# Patient Record
Sex: Female | Born: 1975 | Race: White | Hispanic: No | Marital: Single | Smoking: Former smoker
Health system: Southern US, Community
[De-identification: ages and names within clinical notes are randomized; demographics above are authoritative.]

## PROBLEM LIST (undated history)

## (undated) DIAGNOSIS — E119 Type 2 diabetes mellitus without complications: Secondary | ICD-10-CM

## (undated) DIAGNOSIS — I10 Essential (primary) hypertension: Secondary | ICD-10-CM

## (undated) DIAGNOSIS — E1143 Type 2 diabetes mellitus with diabetic autonomic (poly)neuropathy: Secondary | ICD-10-CM

## (undated) DIAGNOSIS — F419 Anxiety disorder, unspecified: Secondary | ICD-10-CM

## (undated) DIAGNOSIS — G40909 Epilepsy, unspecified, not intractable, without status epilepticus: Secondary | ICD-10-CM

## (undated) DIAGNOSIS — K3184 Gastroparesis: Secondary | ICD-10-CM

## (undated) DIAGNOSIS — K429 Umbilical hernia without obstruction or gangrene: Secondary | ICD-10-CM

## (undated) DIAGNOSIS — I201 Angina pectoris with documented spasm: Secondary | ICD-10-CM

## (undated) DIAGNOSIS — N159 Renal tubulo-interstitial disease, unspecified: Secondary | ICD-10-CM

## (undated) DIAGNOSIS — E109 Type 1 diabetes mellitus without complications: Secondary | ICD-10-CM

## (undated) DIAGNOSIS — F319 Bipolar disorder, unspecified: Secondary | ICD-10-CM

## (undated) DIAGNOSIS — E785 Hyperlipidemia, unspecified: Secondary | ICD-10-CM

## (undated) DIAGNOSIS — E039 Hypothyroidism, unspecified: Secondary | ICD-10-CM

## (undated) HISTORY — PX: CARDIAC CATHETERIZATION: SHX172

## (undated) HISTORY — DX: Umbilical hernia without obstruction or gangrene: K42.9

## (undated) HISTORY — DX: Epilepsy, unspecified, not intractable, without status epilepticus: G40.909

## (undated) HISTORY — DX: Type 2 diabetes mellitus with diabetic autonomic (poly)neuropathy: E11.43

## (undated) HISTORY — DX: Angina pectoris with documented spasm: I20.1

## (undated) HISTORY — DX: Gastroparesis: K31.84

## (undated) HISTORY — PX: TONSILLECTOMY: SUR1361

## (undated) HISTORY — DX: Type 1 diabetes mellitus without complications: E10.9

## (undated) HISTORY — DX: Anxiety disorder, unspecified: F41.9

## (undated) HISTORY — PX: ADENOIDECTOMY: SUR15

## (undated) HISTORY — DX: Hyperlipidemia, unspecified: E78.5

## (undated) HISTORY — DX: Type 2 diabetes mellitus without complications: E11.9

## (undated) HISTORY — DX: Essential (primary) hypertension: I10

## (undated) HISTORY — PX: VAGINAL HYSTERECTOMY: SUR661

---

## 1998-02-15 ENCOUNTER — Ambulatory Visit (HOSPITAL_COMMUNITY): Admission: RE | Admit: 1998-02-15 | Discharge: 1998-02-15 | Payer: Self-pay | Admitting: Obstetrics and Gynecology

## 1998-05-05 ENCOUNTER — Inpatient Hospital Stay (HOSPITAL_COMMUNITY): Admission: AD | Admit: 1998-05-05 | Discharge: 1998-05-07 | Payer: Self-pay | Admitting: Obstetrics and Gynecology

## 1999-02-20 ENCOUNTER — Other Ambulatory Visit: Admission: RE | Admit: 1999-02-20 | Discharge: 1999-02-20 | Payer: Self-pay | Admitting: Obstetrics and Gynecology

## 1999-03-24 ENCOUNTER — Ambulatory Visit (HOSPITAL_COMMUNITY): Admission: RE | Admit: 1999-03-24 | Discharge: 1999-03-24 | Payer: Self-pay | Admitting: Obstetrics and Gynecology

## 1999-03-24 ENCOUNTER — Encounter (INDEPENDENT_AMBULATORY_CARE_PROVIDER_SITE_OTHER): Payer: Self-pay

## 1999-10-27 ENCOUNTER — Other Ambulatory Visit: Admission: RE | Admit: 1999-10-27 | Discharge: 1999-10-27 | Payer: Self-pay | Admitting: Obstetrics and Gynecology

## 2000-05-12 ENCOUNTER — Other Ambulatory Visit: Admission: RE | Admit: 2000-05-12 | Discharge: 2000-05-12 | Payer: Self-pay | Admitting: Obstetrics and Gynecology

## 2000-07-08 ENCOUNTER — Inpatient Hospital Stay (HOSPITAL_COMMUNITY): Admission: AD | Admit: 2000-07-08 | Discharge: 2000-07-08 | Payer: Self-pay | Admitting: Obstetrics and Gynecology

## 2000-07-09 ENCOUNTER — Encounter (HOSPITAL_COMMUNITY): Admission: RE | Admit: 2000-07-09 | Discharge: 2000-10-07 | Payer: Self-pay | Admitting: Obstetrics and Gynecology

## 2000-07-13 ENCOUNTER — Ambulatory Visit (HOSPITAL_COMMUNITY): Admission: RE | Admit: 2000-07-13 | Discharge: 2000-07-13 | Payer: Self-pay | Admitting: Obstetrics and Gynecology

## 2001-07-19 ENCOUNTER — Other Ambulatory Visit: Admission: RE | Admit: 2001-07-19 | Discharge: 2001-07-19 | Payer: Self-pay | Admitting: Obstetrics and Gynecology

## 2001-11-28 ENCOUNTER — Inpatient Hospital Stay (HOSPITAL_COMMUNITY): Admission: AD | Admit: 2001-11-28 | Discharge: 2001-11-28 | Payer: Self-pay | Admitting: Obstetrics and Gynecology

## 2001-11-28 ENCOUNTER — Encounter: Payer: Self-pay | Admitting: Obstetrics and Gynecology

## 2001-12-21 ENCOUNTER — Encounter (INDEPENDENT_AMBULATORY_CARE_PROVIDER_SITE_OTHER): Payer: Self-pay | Admitting: Specialist

## 2001-12-21 ENCOUNTER — Observation Stay (HOSPITAL_COMMUNITY): Admission: RE | Admit: 2001-12-21 | Discharge: 2001-12-22 | Payer: Self-pay | Admitting: Obstetrics and Gynecology

## 2002-01-18 ENCOUNTER — Ambulatory Visit (HOSPITAL_COMMUNITY): Admission: RE | Admit: 2002-01-18 | Discharge: 2002-01-18 | Payer: Self-pay | Admitting: Gastroenterology

## 2002-01-18 ENCOUNTER — Encounter: Payer: Self-pay | Admitting: Gastroenterology

## 2002-04-28 ENCOUNTER — Encounter: Payer: Self-pay | Admitting: Internal Medicine

## 2002-04-28 ENCOUNTER — Encounter: Admission: RE | Admit: 2002-04-28 | Discharge: 2002-04-28 | Payer: Self-pay | Admitting: Internal Medicine

## 2002-07-24 ENCOUNTER — Inpatient Hospital Stay (HOSPITAL_COMMUNITY): Admission: AD | Admit: 2002-07-24 | Discharge: 2002-07-25 | Payer: Self-pay | Admitting: Family Medicine

## 2002-07-26 ENCOUNTER — Encounter: Admission: RE | Admit: 2002-07-26 | Discharge: 2002-10-24 | Payer: Self-pay | Admitting: Family Medicine

## 2002-11-07 ENCOUNTER — Encounter: Admission: RE | Admit: 2002-11-07 | Discharge: 2003-02-05 | Payer: Self-pay | Admitting: Family Medicine

## 2002-11-24 ENCOUNTER — Observation Stay (HOSPITAL_COMMUNITY): Admission: EM | Admit: 2002-11-24 | Discharge: 2002-11-24 | Payer: Self-pay | Admitting: Emergency Medicine

## 2002-11-24 ENCOUNTER — Encounter: Payer: Self-pay | Admitting: Emergency Medicine

## 2002-11-28 ENCOUNTER — Encounter: Admission: RE | Admit: 2002-11-28 | Discharge: 2002-11-28 | Payer: Self-pay | Admitting: Endocrinology

## 2002-11-28 ENCOUNTER — Encounter: Payer: Self-pay | Admitting: Endocrinology

## 2003-11-07 ENCOUNTER — Inpatient Hospital Stay (HOSPITAL_COMMUNITY): Admission: AD | Admit: 2003-11-07 | Discharge: 2003-11-09 | Payer: Self-pay | Admitting: Internal Medicine

## 2003-11-07 ENCOUNTER — Encounter: Payer: Self-pay | Admitting: Family Medicine

## 2003-11-20 ENCOUNTER — Emergency Department (HOSPITAL_COMMUNITY): Admission: EM | Admit: 2003-11-20 | Discharge: 2003-11-20 | Payer: Self-pay | Admitting: Emergency Medicine

## 2003-11-29 ENCOUNTER — Emergency Department (HOSPITAL_COMMUNITY): Admission: EM | Admit: 2003-11-29 | Discharge: 2003-11-30 | Payer: Self-pay | Admitting: Emergency Medicine

## 2003-12-17 ENCOUNTER — Ambulatory Visit (HOSPITAL_COMMUNITY): Admission: RE | Admit: 2003-12-17 | Discharge: 2003-12-17 | Payer: Self-pay | Admitting: Endocrinology

## 2003-12-24 ENCOUNTER — Inpatient Hospital Stay (HOSPITAL_COMMUNITY): Admission: EM | Admit: 2003-12-24 | Discharge: 2003-12-27 | Payer: Self-pay

## 2003-12-24 ENCOUNTER — Encounter: Payer: Self-pay | Admitting: Emergency Medicine
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
## 2004-07-14 ENCOUNTER — Emergency Department (HOSPITAL_COMMUNITY): Admission: EM | Admit: 2004-07-14 | Discharge: 2004-07-15 | Payer: Self-pay | Admitting: Emergency Medicine

## 2004-10-01 ENCOUNTER — Emergency Department (HOSPITAL_COMMUNITY): Admission: EM | Admit: 2004-10-01 | Discharge: 2004-10-02 | Payer: Self-pay | Admitting: Emergency Medicine

## 2005-01-21 ENCOUNTER — Inpatient Hospital Stay (HOSPITAL_COMMUNITY): Admission: EM | Admit: 2005-01-21 | Discharge: 2005-01-24 | Payer: Self-pay | Admitting: Emergency Medicine

## 2008-08-18 ENCOUNTER — Emergency Department (HOSPITAL_COMMUNITY): Admission: EM | Admit: 2008-08-18 | Discharge: 2008-08-18 | Payer: Self-pay | Admitting: Emergency Medicine

## 2009-01-04 ENCOUNTER — Ambulatory Visit: Payer: Self-pay | Admitting: Family Medicine

## 2009-01-04 DIAGNOSIS — R569 Unspecified convulsions: Secondary | ICD-10-CM | POA: Insufficient documentation

## 2009-01-04 DIAGNOSIS — E109 Type 1 diabetes mellitus without complications: Secondary | ICD-10-CM | POA: Insufficient documentation

## 2009-01-04 DIAGNOSIS — J209 Acute bronchitis, unspecified: Secondary | ICD-10-CM

## 2009-01-04 HISTORY — DX: Acute bronchitis, unspecified: J20.9

## 2009-06-07 ENCOUNTER — Ambulatory Visit (HOSPITAL_COMMUNITY): Admission: RE | Admit: 2009-06-07 | Discharge: 2009-06-07 | Payer: Self-pay | Admitting: Specialist

## 2009-06-13 ENCOUNTER — Encounter: Admission: RE | Admit: 2009-06-13 | Discharge: 2009-06-13 | Payer: Self-pay | Admitting: Endocrinology

## 2009-10-18 ENCOUNTER — Ambulatory Visit: Payer: Self-pay | Admitting: Diagnostic Radiology

## 2009-10-18 ENCOUNTER — Emergency Department (HOSPITAL_BASED_OUTPATIENT_CLINIC_OR_DEPARTMENT_OTHER): Admission: EM | Admit: 2009-10-18 | Discharge: 2009-10-18 | Payer: Self-pay | Admitting: Emergency Medicine

## 2009-11-18 ENCOUNTER — Encounter: Admission: RE | Admit: 2009-11-18 | Discharge: 2009-11-18 | Payer: Self-pay | Admitting: Obstetrics and Gynecology

## 2010-09-21 ENCOUNTER — Encounter: Payer: Self-pay | Admitting: Obstetrics and Gynecology

## 2010-09-21 ENCOUNTER — Encounter: Payer: Self-pay | Admitting: Family Medicine

## 2010-11-06 ENCOUNTER — Ambulatory Visit: Payer: Self-pay | Admitting: *Deleted

## 2010-11-06 ENCOUNTER — Encounter: Payer: Medicare Other | Attending: Endocrinology | Admitting: Dietician

## 2010-11-06 DIAGNOSIS — Z713 Dietary counseling and surveillance: Secondary | ICD-10-CM | POA: Insufficient documentation

## 2010-11-06 DIAGNOSIS — E109 Type 1 diabetes mellitus without complications: Secondary | ICD-10-CM | POA: Insufficient documentation

## 2010-11-20 LAB — COMPREHENSIVE METABOLIC PANEL
ALT: 15 U/L (ref 0–35)
AST: 28 U/L (ref 0–37)
CO2: 19 mEq/L (ref 19–32)
Chloride: 95 mEq/L — ABNORMAL LOW (ref 96–112)
Creatinine, Ser: 1 mg/dL (ref 0.4–1.2)
GFR calc Af Amer: >60 mL/min (ref >60)
GFR calc non Af Amer: >60 mL/min (ref >60)
Glucose, Bld: 351 mg/dL — ABNORMAL HIGH (ref 70–99)
Total Bilirubin: 0.6 mg/dL (ref 0.3–1.2)

## 2010-11-20 LAB — URINE MICROSCOPIC-ADD ON

## 2010-11-20 LAB — DIFFERENTIAL
Basophils Absolute: 0 10*3/uL (ref 0.0–0.1)
Eosinophils Absolute: 0 10*3/uL (ref 0.0–0.7)
Eosinophils Relative: 1 % (ref 0–5)
Lymphocytes Relative: 15 % (ref 12–46)
Neutrophils Relative %: 74 % (ref 43–77)

## 2010-11-20 LAB — URINALYSIS, ROUTINE W REFLEX MICROSCOPIC
Bilirubin Urine: NEGATIVE
Ketones, ur: >80 mg/dL — AB
Nitrite: NEGATIVE
Protein, ur: NEGATIVE mg/dL
Specific Gravity, Urine: 1.031 — ABNORMAL HIGH (ref 1.005–1.030)
Urobilinogen, UA: 0.2 mg/dL (ref 0.0–1.0)

## 2010-11-20 LAB — URINE CULTURE

## 2010-11-20 LAB — CBC
Hemoglobin: 15.9 g/dL — ABNORMAL HIGH (ref 12.0–15.0)
MCHC: 34.1 g/dL (ref 30.0–36.0)
MCV: 93.9 fL (ref 78.0–100.0)
RBC: 4.95 MIL/uL (ref 3.87–5.11)
WBC: 8 10*3/uL (ref 4.0–10.5)

## 2010-12-04 LAB — CREATININE, SERUM
Creatinine, Ser: 0.95 mg/dL (ref 0.4–1.2)
GFR calc Af Amer: >60 mL/min (ref >60)
GFR calc non Af Amer: >60 mL/min (ref >60)

## 2010-12-04 LAB — BUN: BUN: 8 mg/dL (ref 6–23)

## 2010-12-19 ENCOUNTER — Encounter: Payer: Medicare Other | Attending: Endocrinology | Admitting: Dietician

## 2010-12-19 DIAGNOSIS — E109 Type 1 diabetes mellitus without complications: Secondary | ICD-10-CM | POA: Insufficient documentation

## 2010-12-19 DIAGNOSIS — Z713 Dietary counseling and surveillance: Secondary | ICD-10-CM | POA: Insufficient documentation

## 2011-01-06 ENCOUNTER — Ambulatory Visit: Payer: Medicare Other | Admitting: Dietician

## 2011-01-16 NOTE — H&P (Signed)
[facility]  Patient:    Karen Hayden, Karen Hayden Visit Number: [id number] MRN: [id number]          Service Type: GYN Location: [facility] Attending Physician:  Michaele Offer Dictated by:   Zenaida Niece, M.D. Admit Date:  11/28/2001 Discharge Date: 11/28/2001                           History and Physical  CHIEF COMPLAINT:  Pelvic pain and abnormal uterine bleeding.  HISTORY OF PRESENT ILLNESS:  This is a 35 year old white female.  She is a gravida 2, para 2-0-0-2, who had the fairly recent onset of pelvic pain and abnormal uterine bleeding.  She was seen in the office in November of 2002 for a yearly exam.  At that time, she complained of some cramping with her periods and occasional mild dyspareunia.  She had previously been treated with six months of Lupron after laparoscopy for endometriosis.  Her symptoms initially improved, but she at that point was getting slightly worsening of cramps and dyspareunia.  At that point, options were discussed and she was placed on NuvaRing.  She did not trust the NuvaRing, so she then tried the Ortho Evra patch.  On the patch, she began bleeding for approximately four weeks.  She was having bilateral pelvic cramping and a constant dull ache for which she was taking Vioxx.  She removed the patch and continued to have bleeding.  She was then placed on Provera to help stabilize the endometrium and hopefully stopped the bleeding.  She was also prescribed Vicodin to help control her pain.  On November 28, 2001, the patients pain became so severe that she went to [facility].  There, a pelvic ultrasound revealed no adnexal masses and a normal uterus.  Labs were within normal limits.  Her pain resolved with IV Toradol and she was sent home on oral Toradol.  She followed up here in the office on December 09, 2001.  At that time, while taking the Provera, she had had no bleeding for one week.  The Toradol was controlling her  pain which was a 5 out of 10 at that point.  She reports that the pain is worse on her left and that heat helps.  Due to the fact that her pain is gradually getting worse, requiring NSAIDs and mild narcotics to control her pain, surgical options were discussed with the patient.  The patient has had a prior tubal ligation and wishes to proceed with definitive surgical therapy.  The patient is admitted for this at this time.  PAST OB HISTORY:  Two vaginal deliveries at term without complications.  GYN HISTORY:  History of endometriosis diagnosed by laparoscopy, treated with fulguration.  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  PAST MEDICAL HISTORY:  Negative.  PAST SURGICAL HISTORY:  Significant for a diagnostic laparoscopy and fulguration of endometriosis, LEEP, and laparoscopic tubal ligation.  ALLERGIES:  CODEINE.  CURRENT MEDICATIONS:  Toradol 10 mg p.o. q.d.  SOCIAL HISTORY:  The patient is married and denies alcohol, tobacco, or drug use.  REVIEW OF SYSTEMS:  Otherwise negative with no urinary or bowel complaints.  FAMILY HISTORY:  No Gyn or colon cancer.  PHYSICAL EXAMINATION:  GENERAL:  This is a thin white female in mild to moderate distress from her pelvic pain.  VITAL SIGNS:  Weight is 95 pounds,  blood pressure is 110/82, pulse 80.  NECK:  Supple without lymphadenopathy or thyromegaly.  LUNGS:  Clear to auscultation.  HEART:  Regular rate and rhythm without murmur.  ABDOMEN:  Soft and nontender without palpable masses.  She has well-healed laparoscopic scars and she does have mild bilateral lower quadrant tenderness. She has no rebound.  PELVIC EXAMINATION:  Cervix appears normal.  On bimanual exam, she is diffusely tender, worse in the left vaginal fornix without palpable masses. She does have a small uterus which is mid plane or to retroverted and slightly tender.  ASSESSMENT:   Worsening pelvic pain, thought to be due to recurrent endometriosis.  The patient desires definitive surgical therapy.  She is young, but has undergone a tubal ligation previously.  She understands that hysterectomy will remove all chances of pregnancy.  Risks of surgery including bleeding, infection, and damage to the surrounding organs have also been discussed with the patient.  The patient understands these risks and wishes to proceed.  PLAN:  Admit the patient for a laparoscopic-assisted vaginal hysterectomy with a probable left salpingo-oophorectomy, and possible right salpingo-oophorectomy, as well as cystoscopy. Dictated by:   Zenaida Niece, M.D. Attending Physician:  Michaele Offer DD:  12/20/01 TD:  12/21/01 Job: [id number]

## 2011-01-16 NOTE — Discharge Summary (Signed)
NAME:  Karen Hayden, Karen Hayden NO.:  [id number]   MEDICAL RECORD NO.:  [id number]                   PATIENT TYPE:   LOCATION:                                       FACILITY:   PHYSICIAN:  Eugenio Hoes. Tawanna Cooler, M.D. [facility]           DATE OF BIRTH:   DATE OF ADMISSION:  DATE OF DISCHARGE:                                 DISCHARGE SUMMARY   HISTORY OF PRESENT ILLNESS:  This is the 4th [facility]  admission for this 35 year old married white female, Gravida II, AB 0, who  comes to the hospital as a new onset diabetic and treatment of hypoglycemia.  As noted, the patient was found to be a diabetic when she presented to the  office with a blood sugar greater than 500. She was ordered 6 units of  insulin subcutaneous and inadvertently 60 units were given. Her blood sugar  dropped precipitously over an hour to and hour and half down to 50. She  became asymptomatic. She was given oral medications. Her blood sugar came  back up and dropped again. She became very lethargic. IV fluids were  started. She was given 3 liters of D5NS in the office. However, blood sugar  continued to drop and we decreased her IV rate to below 100 cc per hour.  Therefore, she was admitted to [facility] for evaluation,  monitoring, and education.   PAST MEDICAL HISTORY:  See admission history and physical.   REVIEW OF SYSTEMS:  See admission history and physical.   SOCIAL HISTORY:  See admission history and physical.   FAMILY HISTORY:  See admission history and physical.   PHYSICAL EXAMINATION:  See admission history and physical.   LABORATORY DATA:  Her CBC, metabolic panel, hemoglobin A1C, etc., were  ordered but are not recorded in the chart.   DIAGNOSTIC IMPRESSION:  Her EKG was within normal limits.   HOSPITAL COURSE:  The patient was admitted to [facility]  and maintained on D51/2NS with no further hypoglycemic spells. At 7:30 a.m.  I rounded  on the patient. We went through the issues of again, apologizing  for the error in dosage. We talked about ambulating, checking her own blood  sugar which she had done this morning and did it successfully with no  problems. We also talked about giving herself her own insulin injections.  She will be ambulated. The IV will be converted to HEP lock. She will be  given instructions on how to draw up and administer her own insulin. She  will be discharged around noon time to be seen in the office between 4 and 5  this afternoon for further evaluation and teaching.   PHYSICAL EXAMINATION:  Unchanged prior to discharge.   COMPLICATIONS:  None.   DISCHARGE MEDICATIONS:  She is to take her insulin 70/30 Humulin on a  sliding scale and we will purposely under  shoot because of the hypoglycemic  reaction that she had, although that was a dosage error. Less than 200,  given 0; 200 to 250 given 2 units; 250 to 300 give 4 units; 300 to 350 given  6 units, 350 to 400 given 8 units, greater than 400 given 10 units.   SPECIAL INSTRUCTIONS:  Will get a blood sugar before breakfast and one two  hours after meals and any other time, she is to check her blood sugar if she  feels shaky, etc.   DIET:  She will be maintained on approximately an 1800 calorie ADA diet.   FOLLOW UP:  She will be seen in the office in follow-up this afternoon.                                               Jeffrey A. Tawanna Cooler, M.D. [facility]    JAT/MEDQ  D:  07/25/2002  T:  07/25/2002  Job:  [id number]

## 2011-01-16 NOTE — Op Note (Signed)
[facility]  Patient:    Karen Hayden, Karen Hayden Visit Number: [id number] MRN: [id number]          Service Type: SUR Location: 4W [id number] 01 Attending Physician:  Michaele Offer Dictated by:   Zenaida Niece, M.D. Proc. Date: 12/21/01 Admit Date:  12/21/2001                             Operative Report  PREOPERATIVE DIAGNOSES: 1. Pelvic pain. 2. Abnormal uterine bleeding.  POSTOPERATIVE DIAGNOSES: 1. Pelvic pain. 2. Abnormal uterine bleeding.  OPERATION:  Laparoscopically-assisted vaginal hysterectomy with left salpingo-oophorectomy and cystoscopy.  SURGEON:  Zenaida Niece, M.D.  ASSISTANT:  Alvino Chapel, M.D.  ANESTHESIA:  General endotracheal tube.  ESTIMATED BLOOD LOSS:   300 cc.  FINDINGS:  Normal appearing uterus, tubes and ovaries without evidence of significant endometriosis.  She had an enlarged liver with normal appendix.  DESCRIPTION OF PROCEDURE:  The patient was taken to the operating room and placed in the dorsal supine position.  General anesthesia was induced, and she was placed in mobile stirrups.  The abdomen, perineum and vagina were prepped and draped in the usual sterile fashion for a laparoscopic and vaginal procedure.  Her bladder was drained with a red rubber catheter, and a Hulka tenaculum applied to her cervix for uterine manipulation.  Her infraumbilical skin was then infiltrated with 0.25% Marcaine and a 1.5 cm horizontal incision was made in her previous scar.  The Verres needle was inserted and placement confirmed by the water drop test at an opening pressure of 0 mmHg.  Her abdomen was insufflated with CO2 gas and the Verres needle was removed.  The 10-11 trocar was then introduced and placement confirmed by the laparoscope. The 5 mm ports were placed on each side under direct visualization to aid in manipulation.  Inspection revealed the above mentioned findings.  On the left side, her  infundibulopelvic ligament was coagulated with bipolar cautery and incised.  This was also done to take down the mesosalpinx, round ligament and upper broad ligament on the left side.  On the right side, the utero-ovarian ligament, the utero-ovarian pedicle was taken down with bipolar cautery and incised as well as the round ligament and upper broad ligament.  The anterior peritoneum was then incised across the anterior portion of the uterus, and the bladder was pushed inferiorly.  This achieved LSO attached to the uterus and preserved her right tube and ovary.  Again, right tube and ovary appeared perfectly normal.  Attention was then turned vaginally.  A weighted speculum was inserted into the vagina and the cervix was grasped with Christella Hartigan tenaculums.  The cervicovaginal mucosa was then infiltrated with dilute Pitressin and incised circumferentially with electrocautery.  The anterior vagina was dissected sharply and bluntly and the anterior peritoneum was identified and entered sharply.  The Deaver retractor was used to retract the bladder anteriorly.  The posterior cul-de-sac was grasped with the Kocher clamp and entered sharply with scissors.  The banana speculum was then inserted into the peritoneal cavity.  Uterosacral ligaments were cut, transected and ligated on each site with two bites.  This was tagged for later use.  The uterine arteries and broad ligaments were then clamped, transected and ligated on each side with #1 chromic, and the uterus was removed. Bleeding on the right side was controlled with a figure-of-eight #1 chromic. All pedicles then appeared to be hemostatic.  The  vaginal cuff was run to the peritoneum with a running locking suture of 0 Vicryl to aid in hemostasis. Uterosacral ligaments were then plicated in the midline with 0 silk.  The vagina was then closed with running locking 2-0 Vicryl with adequate hemostasis.  The patient had been given indigo carmine IV.   The 70 degree cystoscope was inserted and the bladder filled with saline.  Indigo carmine was seen to come from each ureteral orifice and the bladder appeared intact.  The cystoscope was removed, and a Foley catheter inserted.  The patients legs were then taken down.  The stirrups were lowered and I changed gloves.  The laparoscope was reinserted and the pelvis irrigated.  A small amount of bleeding anteriorly around the bladder was controlled with one buzz of bipolar cautery. The pelvis was otherwise hemostatic.  The 5 mm ports were removed under direct visualization.  The 10 mm port was then removed after all gas was allowed to deflate from the abdomen.  The fascia was closed with a figure-of-eight suture of 0 Vicryl.  All skin incisions were closed with interrupted subcuticular sutures of 4-0 Vicryl followed by Steri-Strips and Band-Aids.  The patient was extubated in the operating room, tolerated the procedure well and was taken to the recovery room in satisfactory condition. Dictated by:   Zenaida Niece, M.D. Attending Physician:  Michaele Offer DD:  12/21/01 TD:  12/21/01 Job: [id number]

## 2011-01-16 NOTE — Cardiovascular Report (Signed)
   NAME:  Karen Hayden, ACKLIN                            ACCOUNT NO.:  [id number]   MEDICAL RECORD NO.:  [id number]                   PATIENT TYPE:  INP   LOCATION:  1831                                 FACILITY:  [facility]   PHYSICIAN:  Rollene Rotunda, M.D. [facility]            DATE OF BIRTH:  [birth year]   DATE OF PROCEDURE:  DATE OF DISCHARGE:  11/24/2002                              CARDIAC CATHETERIZATION   PRIMARY CARE PHYSICIAN:  Dr. Gregary Signs A. Everardo All, M.D. [facility].   PROCEDURE:  Left heart catheterization/coronary arteriography.   INDICATIONS:  Evaluate patient with chest pain suggestive of unstable  angina.   DESCRIPTION OF PROCEDURE:  Left heart catheterization was performed via the  left femoral artery.  The artery was cannulated using the anterior wall  puncture. A #6 French arterial sheath was inserted via the modified  Seldinger technique.  Preformed Judkins and a pigtail catheter were  utilized. The patient tolerated the procedure well and left the lab in  stable condition.   HEMODYNAMICS:  LV 107/6, AO 106/68.   CORONARY ARTERIES:  1. Left main was normal.  2. The LAD was normal.  3. The circumflex was normal.  4. The right coronary artery was dominant and normal.   LEFT VENTRICULOGRAM:  The left ventriculogram was obtained in the RAO  projection.  The EF was 60% with normal wall motion.   CONCLUSIONS:  Normal coronary arteries.  Normal left ventricular function.   PLAN:  No further cardiac workup is suggested.  The patient will continue to  have aggressive primary risk reduction per Dr. Everardo All.  She will have  workup of nonanginal etiologies of her chest discomfort.                                                Rollene Rotunda, M.D. [facility]    JH/MEDQ  D:  11/24/2002  T:  11/25/2002  Job:  [id number]   cc:   Gregary Signs A. Everardo All, M.D. [facility]

## 2011-01-16 NOTE — H&P (Signed)
NAME:  Karen Hayden, Karen Hayden                            ACCOUNT NO.:  [id number]   MEDICAL RECORD NO.:  [id number]                   PATIENT TYPE:  INP   LOCATION:  5524                                 FACILITY:  [facility]   PHYSICIAN:  Tera Mater. Clent Ridges, M.D. [facility]            DATE OF BIRTH:  1976/07/24   DATE OF ADMISSION:  11/07/2003  DATE OF DISCHARGE:                                HISTORY & PHYSICAL   CHIEF COMPLAINT:  This is a 35 year old woman with type 1 diabetes now with  abdominal pain, vomiting, and possible pancreatitis.   HISTORY OF PRESENT ILLNESS:  The patient presents with a four-day history of  rapidly advancing left upper quadrant abdominal pain with nausea and  vomiting, especially over the last 24 hours.  She has described a sharp  constant pain just under the ribs in the left upper quadrant of the abdomen  and somewhat in the left flank.  At times it radiates through to the left  mid back region.  She has vomited on two occasions and is constantly  nauseated.  She denies any fever.  Appetite is decreased.  She has been  unable to keep fluids down today.  She has had normal bowel sounds,  including the last one yesterday.  She has had no urinary tract infection  symptoms.  No coughing or difficulty breathing.  She had laboratory work  done here in the clinic yesterday which returned a lipase level of 34 and an  amylase of 78.  Electrolytes were normal.  CBC was normal with a white blood  cell count of 9.7.  She also had an abdominal ultrasound earlier this  morning which was essentially normal, although the entire pancreas could not  be seen.  Also had a verbal report of a contrasted abdominal CT scan just  prior to admission which again did not visualize the tail of the pancreas  very well, but was otherwise within normal limits.   PAST MEDICAL HISTORY:  1. Type 1 diabetes which was diagnosed in October of 2003.  She now wears an     insulin pump.  She sees Dr. Lurene Shadow for  endocrinology care.  2. She has had two vaginal deliveries.   PAST SURGICAL HISTORY:  1. In 2003, she had a total abdominal hysterectomy and a left-sided     oophorectomy for endometriosis.  2. She has had a tonsillectomy.  3. She has also had an umbilical hernia repair.   HABITS:  She does not use tobacco or alcohol.   CURRENT MEDICATIONS:  1. Humalog per insulin pump at a basilar rate of 1 unit/hr.  2. Effexor XR 150 mg daily.  3. Ativan 0.5 mg q.6h. p.r.n. anxiety.   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  She is currently separated from her husband and has two children.   FAMILY HISTORY:  Her father  has hypothyroidism.  Her mother died of a heart  attack at age 61.  She had coronary artery disease, an abdominal aortic  aneurysm, type 2 diabetes, and hyperlipidemia.  There are also several  incidences of diabetes in grandparents.   DRUG ALLERGIES:  None.   OBJECTIVE:  HEIGHT:  5 feet 2 inches.  WEIGHT:  109 pounds.  VITAL SIGNS:  BP 94/62, temperature 97.3 degrees, pulse 76 and regular,  respirations 16 and comfortable.  GENERAL APPEARANCE:  She is in some distress with abdominal pain, but alert.  HEENT:  Mucous membranes show some mild dehydration.  The skin is otherwise  clear.  The eyes are clear.  The ears are clear.  The oropharynx is clear.  NECK:  Supple without lymphadenopathy or masses.  LUNGS:  Clear.  CARDIAC:  Rate and rhythm are regular without murmur, rub, or gallop.  Pulses are full.  ABDOMEN:  Soft.  Normal bowel sounds.  No masses or hepatosplenomegaly.  She  is exquisitely tender in the left upper quadrant of the abdomen.  There is  positive rebound, but there is no guarding.  EXTREMITIES:  No cyanosis, clubbing, or edema.  NEUROLOGIC:  Grossly intact.   ASSESSMENT AND PLAN:  Worsening left upper quadrant abdominal pain with  vomiting and some dehydration.  This is worrisome for pancreatitis.  Other  etiologies  possibly could include peptic ulcer disease.  At this point, she  will be admitted and kept NPO.  She will be given IV fluids and more  laboratories will be obtained on admission.  Also with her type 1 diabetes  this needs to be monitored very closely.  Will do frequent glucose checks  and consult Dr. Lurene Shadow to help manage this.                                                Tera Mater. Clent Ridges, M.D. [facility]    SAF/MEDQ  D:  11/08/2003  T:  11/09/2003  Job:  [id number]

## 2011-01-16 NOTE — Discharge Summary (Signed)
NAME:  Karen Hayden, Karen Hayden                            ACCOUNT NO.:  [id number]   MEDICAL RECORD NO.:  [id number]                   PATIENT TYPE:  INP   LOCATION:  5524                                 FACILITY:  [facility]   PHYSICIAN:  Gordy Savers, M.D. [facility]      DATE OF BIRTH:  03-17-1976   DATE OF ADMISSION:  11/07/2003  DATE OF DISCHARGE:  11/09/2003                                 DISCHARGE SUMMARY   DISCHARGE DIAGNOSES:  1. Gastritis.  2. 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  3. Musculoskeletal chest pain.  4. Hypertension.  5. Hypoglycemia.   BRIEF ADMISSION HISTORY:  Karen Hayden is a 35 year old white female, insulin-  dependent diabetic with insulin pump.  She presented with left upper  quadrant pain associated with vomiting.  For the five days prior to this  admission, she had left upper quadrant discomfort.  It progressed over the  24 hours prior to admission.  The pain was severe.  It precipitated nausea  and vomiting.  She has been having normal bowel movements.  She denied any  fever or urinary tract symptoms.  On the day of admission, she had an  abdominal ultrasound that was normal.  This was followed by CT of the  abdomen and pelvis that showed indistinct pancreatic tail, and pancreatitis  could not be ruled out.  The patient was admitted for further evaluation.   PAST MEDICAL HISTORY:  1. Adult-onset diabetes mellitus diagnosed October 2003, previously followed     by Dr. Kyra Searles, to be followed by Dr. Talmage Nap in the future.  2. SVD x 2  3. Status post total abdominal hysterectomy with left salpingo-oophorectomy     in 2003 for endometriosis.  4. Status post tonsillectomy.  5. Umbilical hernia.   HOSPITAL COURSE:  #1.  GASTROINTESTINAL:  The patient presented with atypical left upper  quadrant pain, admitted to rule out pancreatitis.  Lipase and amylase are  normal as were her LFTs.  There is no evidence at this time of pancreatitis,  although her CT was also  suspicious for perhaps gastritis.  She may also  have had some underlying gastroparesis.  The patient's symptoms are  improving.  Etiology was not clear.  CT also was unremarkable for any  evidence of pneumonia.  We did a rib detail which was also negative for  fracture.  It is felt that a component of this pain is pleuritic or  musculoskeletal.  We will probably try some nonsteroidals as an outpatient.   #2.  INFECTIOUS DISEASE:  The patient has been started on Zithromax for a  possible bronchitis.  She has completed three days and will cover to  complete a full five-day course.  She has been afebrile.  White count was  normal, and she has not been hypoxic.  Lungs are clear.  Clinically, no  signs of infection.   #3.  ADULT-ONSET DIABETES MELLITUS, INSULIN REQUIRING WITH A HUMALOG INSULIN  PUMP:  The patient had mild hypoglycemic event with CBGs of 53 and 60.  The  patient was started on a Glucomander, but that has also been discontinued.  The patient states she is comfortable with making basal rate adjustments in  her insulin pump as needed when she is sick.  The patient has not had any  symptoms of hypoglycemia.   #4.  HYPOTENSION:  The patient was mildly dehydrated on admission, and her  blood pressure has improved with fluids.   DISCHARGE LABORATORY DATA:  Rib detail negative for fractures.   CBC normal  BMET normal.  LFTs normal.   DISCHARGE MEDICATIONS:  1. Humalog insulin pump.  2. Effexor 150 mg daily.  3. Ativan 0.5 mg p.r.n.  4. Protonix 30 mg for a 30-day trial.  5. Zithromax 250 mg for two additional days.  6. Vicodin 5/500 1 tablet every 6 hours as needed.  7. Tussionex 5 ml q.12h. as needed.   FOLLOW UP:  Follow up with Dr. Clent Ridges next week.      Cornell Barman, P.A. [facility]                  Gordy Savers, M.D. [facility]    LC/MEDQ  D:  11/09/2003  T:  11/11/2003  Job:  [id number]   cc:   Jeannett Senior A. Clent Ridges, M.D. [facility]   Dorisann Frames, M.D.  [phone] [address], [place] [address]  Fax: [phone]

## 2011-01-16 NOTE — H&P (Signed)
NAME:  Karen Hayden, Karen Hayden                            ACCOUNT NO.:  [id number]   MEDICAL RECORD NO.:  [id number]                   PATIENT TYPE:  INP   LOCATION:  0444                                 FACILITY:  [facility]   PHYSICIAN:  Eugenio Hoes. Tawanna Cooler, M.D. [facility]           DATE OF BIRTH:  Jan 01, 1976   DATE OF ADMISSION:  07/24/2002  DATE OF DISCHARGE:                                HISTORY & PHYSICAL   HISTORY OF PRESENT ILLNESS:  This is the third [facility]  admission for this 35 year old married white female, gravida 2, para 2,  abortus 0, who comes in for observation and therapy of new-onset diabetes.   The patient states she was fine until about a month ago when she noticed she  was having difficulty with her vision.  She states it was blurred.  Then two  weeks ago she experienced increased third, polyuria, nocturia x 2 or 3.  She  denies any weight loss, but her normal weight is around 93 pounds anyway.   She was seen today in the office with that symptomatology.  She works at the  [employer] here at [place] with [place].  The random blood sugar when  she came in at 8:30 was 308, at 11 o'clock it was greater than 500, and she  felt miserable.  She was, therefore, seen immediately and examined.  Vital  signs were taken and six units of Regular Insulin subcutaneous were ordered.  This was given, and over approximately two hours or an hour-and-a-half her  blood sugar dropped to 50.  She was given oral glucose and food, and blood  sugar came back up.  Then it dropped again below 100, and she was  symptomatic and was extremely lethargic.  At that time IV was started.  She  was given an infusion of D-5-W half normal saline, and blood sugar over  about 15-20 minutes came up to 140.  On the IV fluids and having eaten, the  blood sugar then dropped again in the 80 range.  She was somewhat  symptomatic but not much.   I did not understand why her blood sugar dropped so  precipitously with only  six units of insulin.  In retracing our steps through the day, the insulin  given was 60 units instead of 6.  Then it made sense that her blood sugar  dropped so precipitously.  Because of the hypoglycemic episode, it was felt  prudent to admit her to the hospital for observation.  She is going to Room  444 at [facility] now.   PAST MEDICAL HISTORY:  1. Childbirth x 2.  2. TAH and left oophorectomy for endometriosis sprain 2003.  3. T&A as a child.  4. Umbilical hernia done as an outpatient.  5. Past illnesses, none.  6. Injuries, none.   DRUG ALLERGIES:  None.   TOBACCO/ALCOHOL:  Does not  smoke or drink any alcohol.   CURRENT MEDICATIONS:  None.   REVIEW OF SYSTEMS:  HEENT:  Negative except for blurred vision for a month.  She gets regular dental care.  She has no history of any cardiac or  pulmonary disease.  She has had no history of any GI disease.  GENITOURINARY:  Negative.  GYNECOLOGICAL:  She is gravida 2, para 2, abortus  0.  She had a TAH and left oophorectomy, as noted above, for endometriosis  in the spring of 2003.  Since that time she has done fine from a GYN point  of view.  ENDOCRINE:  Negative except for the symptoms as noted above.  Her  weight has been steady at 93 pounds.  MUSCULOSKELETAL, VASCULAR, ALLERGY:  Negative.   SOCIAL HISTORY:  She is married and lives here in the [place] area.  She  has two children.  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   FAMILY HISTORY:  Dad is 63, has hypothyroidism.  Mother died at 76 from MI.  She had coronary disease, had bypass surgery, AAA, diabetes type 2,  hyperlipidemia, and a smoker.  No brothers, no sisters.  A maternal  grandmother and a paternal grandfather had diabetes.   VACCINATION HISTORY:  Up to date.   PHYSICAL EXAMINATION:  VITAL SIGNS:  Temperature 98.  Height 5 feet 2  inches, weight 93 pounds.  BP 106/80, pulse 70 and  regular.  She is  afebrile.  GENERAL:  Well-developed, thin white female in no acute distress.  HEENT:  Negative.  NECK:  Supple.  Thyroid not enlarged.  CHEST:  Clear to auscultation.  CARDIAC:  Negative.  ABDOMEN:  Negative.  EXTREMITIES:  Normal, except for multiple tattoos.   IMPRESSION:  New-onset diabetes with hypoglycemia secondary to the wrong  dose of insulin being given.  Six was ordered; she was given 60.   PLAN:  Admit.  IV fluids.  Monitor blood sugar, sliding scale.  Discharge in  a.m. if stable, and then monitor blood sugar and follow-up to see what her  needs will be in the future.                                               Jeffrey A. Tawanna Cooler, M.D. [facility]    JAT/MEDQ  D:  07/24/2002  T:  07/24/2002  Job:  [id number]

## 2011-01-16 NOTE — Discharge Summary (Signed)
NAME:  Karen Hayden, Karen Hayden                            ACCOUNT NO.:  [id number]   MEDICAL RECORD NO.:  [id number]                   PATIENT TYPE:  INP   LOCATION:  1831                                 FACILITY:  [facility]   PHYSICIAN:  Jesse Sans. Wall, M.D. [facility]            DATE OF BIRTH:  June 30, 1976   DATE OF ADMISSION:  11/24/2002  DATE OF DISCHARGE:  11/24/2002                           DISCHARGE SUMMARY - REFERRING   PROCEDURES:  1. Cardiac catheterization.  2. Coronary arteriogram.  3. Left ventriculogram.   HISTORY OF PRESENT ILLNESS:  The patient is a 35 year old female with no  known history of coronary artery disease.  Her cardiac risk factors include  insulin-dependent diabetes mellitus and a strong family history of premature  coronary artery disease.  She also has hyperlipidemia.  She complained of  chest pain and had tightness over the last two weeks that was associated  with dyspnea on exertion.  Three days ago, she developed severe substernal  crushing chest pain and was set up for a treadmill, but had recurrent chest  pain and came to the ER.  Her symptoms were concerning for unstable anginal  pain and she was admitted to rule out MI and for further evaluation.   HOSPITAL COURSE:  Her initially enzymes were negative, but it was felt for  definitive evaluation that cardiac catheterization was indicated and this  was performed on November 24, 2002.  The cardiac catheterization showed normal  coronary arteries and an EF of 60% without wall motion abnormalities.  It  was felt that no further cardiac work-up was needed and that her chest pain  was nonanginal in origin and could be further evaluated by Sean A. Everardo All,  M.D.   Post catheterization, the patient had stable vital signs and no further  episodes of pain.  Pending completion of bed rest and ambulation without any  difficulty, she is considered for discharge on November 24, 2002, p.m.   LABORATORY DATA:  Hemoglobin 14.2,  hematocrit 42.2, WBC 6.1, platelets 267.  Sodium 139, potassium 4, chloride 106, BUN 12, creatinine 0.9.  D-dimer was  less than 0.22.   Chest x-ray negative.   CONDITION ON DISCHARGE:  Stable.   DISCHARGE DIAGNOSES:  1. Chest pain.  No coronary artery disease by catheterization this     admission.  Follow up with her primary care physician.  2. Insulin-dependent diabetes mellitus.  3. Hyperlipidemia.  4. Family history coronary artery disease.  5. History of endometriosis.  6. Status post total abdominal hysterectomy.  7. History of rash with codeine.   ACTIVITY:  Her activity level is to include to driving or sexual or  strenuous activity for the next 48 hours.   DIET:  She is to stick to a low-fat diabetic diet.   FOLLOW-UP:  She is to follow up with Sean A. Everardo All, M.D.  She is to follow  up with follow  up with Rollene Rotunda, M.D., p.r.n.   DISCHARGE MEDICATIONS:  1. Insulin pump at 0.03 units per hour, except for between 4 and 7 a.m. when     it is 0.02 units per hour and 1 unit q.24h.  2. Lipitor 10 mg one-half tablet daily.     Lavella Hammock, P.A. [facility]                  Thomas C. Daleen Squibb, M.D. [facility]    RG/MEDQ  D:  11/24/2002  T:  11/25/2002  Job:  [id number]   cc:   Gregary Signs A. Everardo All, M.D. [facility]   Rollene Rotunda, M.D. [facility]
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
## 2011-01-16 NOTE — H&P (Signed)
NAME:  Karen Hayden, Karen Hayden                  ACCOUNT NO.:  [id number]   MEDICAL RECORD NO.:  [id number]          PATIENT TYPE:  EMS   LOCATION:  ED                           [facility]ACILITY[facility]  [facility]   PHYSICIAN:  Hollice Espy, M.D.DATE OF BIRTH:  [birth year]   DATE OF ADMISSION:  01/21/2005  DATE OF DISCHARGE:                                HISTORY & PHYSICAL   PRIMARY CARE PHYSICIAN:  Dr. Talmage Nap, [facility] of [place].   CHIEF COMPLAINT:  Nausea, vomiting, abdominal pain.   HISTORY OF PRESENT ILLNESS:  The patient is a 35 year old white female with  a past medical history of diabetes mellitus type 1 x3 years, as well as  depression and seizure disorder, who presents with elevated blood sugars and  found to be in DKA.  The patient tells me that normally her blood sugars,  which have been very well-controlled over the last few weeks, however, have  been getting away from her.  She follows closely with Dr. Talmage Nap for her  blood sugars.  She tells me that she has noted that over the past few days,  she has been increasing much more in urination, although, has really not  noticed any dysuria with this.  In addition, her husband has been sick over  the last couple of weeks with the flu that has been going around.  She  herself has started having some productive cough with greenish sputum.  Last  week, she finished a Z-Pak at urgent care, and when the symptoms continued,  she returned back and was given a cough medication, but not another  antibiotic.  She currently then over the last day has been having severe  nausea, vomiting, abdominal pain, unable to keep anything down.  She became  concerned and came into the emergency room for further evaluation.  There  she was found to have an elevated white count of 26.1 with a 93% shift.  Vacuolated neutrophils were noted on peripheral smear.  Other labs of  concern were a CO2 of 11, a glucose of 573, and a creatinine of 1.5.  The  patient's anion gap was  found to be high at 23.  Currently, the patient is  quite anxious.  She complains of hurting, especially in her abdomen.  She  denies any pain anywhere else.  She does complain also of a large headache.  She denies any vision changes or dysphagia.  She denies any chest pain or  palpitations.  She denies any shortness of breath or wheezing, but she does  complain of a productive cough.  She denies any hematuria or dysuria,  although, she does complain of polyuria.  She denies and constipation or  diarrhea.  She denies any recent fevers or chills.  She does complain of  nausea and vomiting.  She denies any focal extremity pain.  Overall, she  feels quite weak, and she has been complaining of some leg numbness  bilaterally, which is new.   REVIEW OF SYSTEMS:  Otherwise negative.   PAST MEDICAL HISTORY:  1.  Diabetes mellitus type 1 x3 years.  2.  Seizure disorder.  Her last seizure was in February.  She has not had      any since her Neurontin was titrated up.  3.  Hyperlipidemia.  4.  Depression.   MEDICATION:  1.  Lantus 18 units q.h.s.  2.  Neurontin 400 t.i.d.  3.  Lexapro 20 p.o. daily.  4.  Insulin sliding scale.  5.  Zetia 20 p.o. daily.   ALLERGIES:  No known drug allergies.   SOCIAL HISTORY:  She recently gave up smoking.  She denies any drug or  alcohol use.   FAMILY HISTORY:  Noncontributory.   PHYSICAL EXAMINATION:  VITAL SIGNS ON ADMISSION:  Temperature 97.4, heart  rate 120, blood pressure 120/78, respiratory rate 32, O2 saturation 100% on  room air.  GENERAL:  The patient is alert and oriented x3, in significant distress,  more from anxiety than anything else.  HEENT:  Normocephalic/atraumatic.  Mucous membranes are dry.  NECK:  She has no carotid bruit.  HEART:  Regular rhythm, tachycardic.  LUNGS:  Clear to auscultation bilaterally.  ABDOMEN:  Soft, nondistended, minimal tenderness diffuse, positive bowel  sounds.  EXTREMITIES:  No cyanosis, clubbing or  edema.   LABORATORY WORK:  UA shows greater than 80 of ketones, greater than 1000  glucose, otherwise normal.  Urine pregnancy negative.  Sodium 130, potassium  5.1, chloride 98, bicarbonate 11, BUN 14, creatinine 1.5, glucose 573,  calcium 10.2, white count 26.1, hemoglobin 14.3, hematocrit 43.7, MCV 86,  platelet count 416,000, 93% neutrophils, noted vacuolated neutrophils on  peripheral smear as well as platelet clumps.   ASSESSMENT/PLAN:  1.  Diabetic ketoacidosis.  The likely underlying cause is this infection.      This could be respiratory.  We will go ahead and check a chest x-ray.      With the patient also with vacuolated neutrophils and high white count,      she may be septic.  We will go ahead and check blood cultures x2.  Also,      I did not comment, but on her physical exam, I noted that on her face      she has multiple scab lesions, and this may be an early cellulitis.      With noted increased incidence of community-acquired MRSA lately, we may      need to switch the patient over to vancomycin if her chest x-ray is      unrevealing and her white count continues to stay elevated.  We will      also check blood cultures, and, for now, go with community-acquired      antibiotics of IV Rocephin and Zithromax.  2.  Anxiety.  To ensure that we are being thorough, we will check a urine      drug screen and give the patient Xanax for now.  3.  Hyperlipidemia.  We will hold on the Zetia for now.  4.  Depression.  Continue Lexapro.  5.  Seizure disorder.  Continue Neurontin.      SKK/MEDQ  D:  01/21/2005  T:  01/21/2005  Job:  [id number]   cc:   Dorisann Frames, M.D.  [address], [place] [address]  Fax: [phone]

## 2011-01-16 NOTE — Op Note (Signed)
[facility]  Patient:    Karen Hayden, Karen Hayden                           MRN: [id number] Proc. Date: 07/13/00 Adm. Date:  [id number] Attending:  Michaele Offer                           Operative Report  PREOPERATIVE DIAGNOSIS:  Pelvic pain.  POSTOPERATIVE DIAGNOSIS:  Pelvic pain and possible endometriosis.  PROCEDURE:  Laparoscopic fulguration of peritoneal lesions.  SURGEON:  Zenaida Niece, M.D.  ANESTHESIA:  General endotracheal tube.  ESTIMATED BLOOD LOSS:  Less than 50 cc.  FINDINGS:  Normal uterus, tubes with evidence of previous tubal fulguration, normal ovaries, normal bowel, normal appendix and liver edge.  She did have some reddish lesions in both ovarian fossae on the right greater than left, which would be consistent with endometriosis.  However, I am not sure that these were not little petechiae that appeared after manipulation.  She does have a small adhesions of the descending colon to the left pelvic rim, which was lysed.  Counts correct.  Condition stable.  PROCEDURE IN DETAIL:  After appropriate informed consent was obtained, the patient was taken to the operating room and placed in the dorsal supine position.  General anesthesia was induced and she was placed in low stirrups. Her abdomen was then prepped and draped in the usual sterile fashion for a laparoscopic procedure, her bladder drained with a red rubber catheter, and a Hulka tenaculum applied to her cervix for uterine manipulation.  Her infraumbilical skin was then infiltrated with 0.25% Marcaine and a 1.5 cm horizontal incision was made in her previous scar.  The 1011 disposable trocar was then introduced and placement confirmed by an opening pressure of 3 mmHg. CO2 gas was insufflated and placement was confirmed by the laparoscope.  A 5 mm port was placed in the midline two fingerbreadths above her pubic symphysis under direct visualization.  Inspection revealed the  above mentioned findings.  The anterior and posterior cul-de-sacs also appeared to be normal. Both ureters were identified and were normal.  Both uterosacral ligaments were normal.  There was no obvious gross source for her pain.  The small reddish lesions beneath both ovaries were coagulated with bipolar cautery with Klepinger forceps.  I feel that these lesions were probably made with manipulation and were not endometriosis.  Appendix appeared normal.  After these lesions in the ovarian fossae were coagulated, there was no other evidence of lesions causing pain and the procedure was terminated.  The 5 mm port was removed under direct visualization.  The laparoscope was removed and all gas allowed to deflate from the abdomen.  The 1011 disposable trocar was then removed.  The fascia was reapproximated with a figure-of-eight sutures of 0 Vicryl.  The skin incisions were reapproximated with interrupted subcuticular sutures of 3-0 Vicryl ______ followed by Steri-Strips and Band-aids.  The Hulka tenaculum was removed.  The patient was extubated in the operating room and taken to the recovery room in stable condition after tolerating the procedure well. DD:  07/13/00 TD:  07/13/00 Job: [id number]

## 2011-01-16 NOTE — H&P (Signed)
NAME:  Karen Hayden, Karen Hayden                            ACCOUNT NO.:  [id number]   MEDICAL RECORD NO.:  [id number]                   PATIENT TYPE:  INP   LOCATION:  1831                                 FACILITY:  [facility]   PHYSICIAN:  Jesse Sans. Wall, M.D. [facility]            DATE OF BIRTH:  October 16, 1975   DATE OF ADMISSION:  11/24/2002  DATE OF DISCHARGE:  11/24/2002                                HISTORY & PHYSICAL   CHIEF COMPLAINT:  Awoke this morning at 6:45 with chest discomfort.  Felt  like something was crushing my chest.  I had this other day in the office  working at [employer] on [address].   The patient is a delightful 35 year old white female with new onset type 1  diabetes, wearer of an insulin pump with a recent hemoglobin A1c of 7.7% who  has been having two week history of chest tightness with shortness of breath  and left arm numbness.  She has also had some associated dyspnea, nausea,  diaphoresis, and dizziness.  Symptoms have been lasting about 15 minutes.   This morning she had recurrent chest discomfort.  This was relieved in the  emergency room  with nitroglycerin and aspirin.  She is now pain free with a  normal EKG.   ALLERGIES:  SHE IS INTOLERANT OF CODEINE.   MEDICATIONS:  She is on insulin pump.  Takes Lipitor 10 mg a day and Estrace  1 mg a day.   PAST MEDICAL HISTORY:  She has a history of endometriosis and status post  total abdominal hysterectomy and right salpingo-oophorectomy.  She has not  had a previous catheterization.   SOCIAL HISTORY:  She lives in [place] with her husband.  She works at  [employer].  She does not smoke or drink.   FAMILY HISTORY:  Father died of myocardial infarction at age 83.  Had his  first myocardial infarction at 49.   REVIEW OF SYSTEMS:  Noncontributory. Refer to the database by Tereso Newcomer,  PA-C.   PHYSICAL EXAMINATION:  VITAL SIGNS:  Blood pressure 127/60, pulse 85 and  regular.  She is in sinus.   Respiratory rate is 12 and unlabored.  Saturation is 99% on room air.  Her temperature is 96.8.  GENERAL:  She is in no acute distress at present.  SKIN:  Warm and dry.  She is slightly pale.  HEENT:  Unremarkable.  NECK:  Shows good carotid upstrokes without bruits.  There is no jugular  venous distention.  There is no lymphadenopathy.  CARDIOVASCULAR:  Reveals a regular  rate and rhythm without murmur, rub or  gallop.  There may be a click at the apex that is intermittent.  ABDOMEN:  Soft.  Good bowel sounds.  There is no hepatosplenomegaly.  There  is no tenderness.  EXTREMITIES:  No clubbing, cyanosis or edema.  Pulses are intact.  NEURO:  Intact.   Chest x-ray is normal.  EKG is sinus rhythm and normal.  D-dimer is less  than 0.2.  Hemoglobin 14.2, platelets 267, creatinine 0.9.  First CPK and  troponin is negative.   ASSESSMENT/PLAN:  Symptoms consistent with unstable angina.  The patient has  significant risk factors including early family history of coronary disease  as well as now type 1 insulin-dependent diabetes.   PLAN:  1. Admit to [facility] telemetry.  2. Catheterization today.  I discussed the indications, risks, potential     benefits to the patient and her husband.  They agreed to proceed.                                               Thomas C. Daleen Squibb, M.D. [facility]    TCW/MEDQ  D:  11/24/2002  T:  11/25/2002  Job:  [id number]   cc:   Gregary Signs A. Everardo All, M.D. [facility]

## 2011-01-16 NOTE — Discharge Summary (Signed)
Karen Hayden, Karen Hayden                  ACCOUNT NO.:  [id number]   MEDICAL RECORD NO.:  [id number]          PATIENT TYPE:  INP   LOCATION:  0368                         FACILITY:  [facility]   PHYSICIAN:  Melissa L. Ladona Ridgel, MD  DATE OF BIRTH:  [birth year]   DATE OF ADMISSION:  01/21/2005  DATE OF DISCHARGE:  01/24/2005                                 DISCHARGE SUMMARY   DISCHARGE DIAGNOSES:  1.  Diabetic ketoacidosis.  The patient is currently under relatively good      control for blood sugars.  The source of infection is felt to be related      to upper respiratory infection.  She is receiving antibiotics for this.      The patient has outstanding test for hepatitic C and HIV.  During the      course of the hospital admission she was concerned about her white count      and admitted that she has been having extramarital unprotected sexual      intercourse and was concerned about the effects of this on her current      infection state.  There are no signs or symptoms of pelvic infection at      this time and the patient will need to follow up with either Dr. Talmage Nap      or Dr. Rito Ehrlich regarding the results of her hepatitis B and HIV.  2.  Seizure disorder.  At this time the patient remains on Neurontin 400      t.i.d. and has been doing quite well.  There were no complications      during the course of this hospital stay related to her seizure disorder.  3.  Hyperlipidemia.  The patient will remain on Zetia 20 mg p.o. daily.  4.  Depression.  The patient did have an episode during the course of the      hospital stay where she made a threat regarding suicidal ideation.  She      had been off of her medications for several days at that time.  Dr.      Jeanie Sewer saw the patient and felt that her judgment was within normal      limits.  Her insight was partial.  He recommended continuing her Lexapro      20 mg daily and that she should follow up with her private psychiatrist      as soon as she is  discharged.  The patient states that she feels better      today.  She denies suicidal or homicidal ideation.  She will resume her      Lexapro 20 mg p.o. daily and will follow up with Andee Poles next      week.  5.  Bronchitis.  The patient will be discharged to home on Zithromax 500 mg      p.o. x4 more days and Augmentin 500 mg b.i.d. x4 more days.   DISCHARGE MEDICATIONS:  1.  Lantus 18 units q.h.s.  2.  Neurontin 400 mg t.i.d.  3.  Lexapro 20 mg daily.  4.  Insulin sliding scale.  5.  Zetia 20 mg p.o. daily.   HISTORY OF PRESENT ILLNESS:  The patient is a 35 year old white female with  a past medical history for type 1 diabetes times the past three years, who  also has a diagnosis of depression and seizure disorder.  The patient  presented to the emergency room on May 24 with an elevated blood glucose and  stated that over the past few weeks, that her sugars had actually been very  well-controlled and now they have been getting away from her.  The patient  related that she had been exposed to her husband who was sick for the past  couple of weeks and she started to have a productive cough with greenish  sputum.  She had finished a Z-pack as an outpatient but the symptoms  continued.  She was continued just on cough medication and at that point,  came to the emergency room for further evaluation because of increased  urination and elevated blood sugars.  In the emergency room the patient was  found to be in DKA.  Her bicarb was decreased to 11.  Glucose was 573.  She  was admitted to the telemetry floor, hydrated, treated with a Glucommander,  and restarted on her long-acting and short-acting insulin.  She was felt to  have a very possible bronchitis and was started on antibiotics.  The patient  seemed to be doing better; however, did experience some low blood sugars.  These were treated appropriately.  On May 26 the patient made comments  related to suicidal ideation after  revealing that she had been having  extramarital sexual interactions with people whom she was not fully  acquainted with and these encounters were unprotected.  She was therefore  nervous about an STD as being the possible source for her DKA.  The  patient's labs were sent for HIV and for hepatitis which need to be followed  up as an outpatient.  The patient was referred for psychiatric evaluation  and found to be stable for discharge and follow-up as an outpatient with her  primary psychiatrist, [facility].  On the day of discharge the patient  remained stable and upbeat.  She states she felt very good.   PHYSICAL EXAMINATION:  VITAL SIGNS:  Her vital signs were stable.  Temperature was 97.8. Blood pressure 123/71, pulse was 70, respirations were  20.  The saturation was 98% on room air.  GENERAL:  This is a well-developed, well-nourished white female in no acute  distress.  HEENT:  Pupils equal, round, and reactive to light.  Extraocular muscles are  intact.  Mucous membranes are moist.  NECK:  Supple.  There is no JVD, no lymph nodes.  No carotid bruit.  CHEST:  Clear to auscultation.  There are no rhonchi, rales, or wheezes.  CARDIOVASCULAR:  Regular rate and rhythm.  Positive S1/S2.  No S3, S4.  No  murmurs, rubs, or gallops are noted.  ABDOMEN:  Soft, nontender, nondistended.  EXTREMITIES:  Show no clubbing, cyanosis, or edema.   LABORATORY DATA:  As stated an HIV and hepatitis B levels are pending.  The  blood cultures have remained negative.  Her discharging hemoglobin is 11.9  with hematocrit 35.3.  Discharging white count is 9.3, down from 14,000.  Her BUN is 7, creatinine is 0.7, and potassium is 3.3.  We will encourage  her to increase her dietary potassium.  Also of note her urinalysis was  negative.  Glycosylated hemoglobin was 8.   At this time the patient is deemed stable for discharge to home to follow up with her psychiatrist and her primary care practitioner,  Dr. Sabino Gasser.  Of note, special instruction, the patient is to call Dr. Rito Ehrlich after June  3 at [phone] to follow up on her labs or she can check with Dr. Talmage Nap who  can look into her HIV and hepatitis panel.       MLT/MEDQ  D:  01/24/2005  T:  01/24/2005  Job:  [id number]   cc:   Dorisann Frames, M.D.  [address] [place] [address]  Fax: [phone]   Andee Poles, M.D.  [address], [address]  [place], [place] [address]  Fax: [phone]

## 2011-03-31 ENCOUNTER — Encounter: Payer: Self-pay | Admitting: Gastroenterology

## 2011-04-02 ENCOUNTER — Ambulatory Visit: Payer: Medicare Other | Admitting: Gastroenterology

## 2011-04-10 ENCOUNTER — Encounter: Payer: Self-pay | Admitting: Internal Medicine

## 2011-04-14 ENCOUNTER — Encounter: Payer: Self-pay | Admitting: Internal Medicine

## 2011-04-14 ENCOUNTER — Ambulatory Visit (INDEPENDENT_AMBULATORY_CARE_PROVIDER_SITE_OTHER): Payer: Medicare Other | Admitting: Internal Medicine

## 2011-04-14 VITALS — BP 98/70 | HR 100 | Ht 61.0 in | Wt 117.0 lb

## 2011-04-14 DIAGNOSIS — R112 Nausea with vomiting, unspecified: Secondary | ICD-10-CM

## 2011-04-14 MED ORDER — PROCHLORPERAZINE MALEATE 5 MG PO TABS: 5.0000 mg | ORAL_TABLET | Freq: Four times a day (QID) | ORAL | Status: DC | PRN

## 2011-04-14 NOTE — Patient Instructions (Signed)
Please stop taking your Zofran. We sent a prescription for compazine to your pharmacy. Do not take compazine after 9:00pm. We have scheduled you an appointment for an Endoscopy.

## 2011-04-14 NOTE — Progress Notes (Signed)
Subjective:    Patient ID: Karen Hayden, female    DOB: 11-23-1975, 35 y.o.   MRN: [id number]  HPI Karen Hayden is a 35 year old female with a history of type 1 diabetes, bipolar depression, seizure disorder, and chronic nausea/vomiting seen in consultation for evaluation of nausea vomiting and weight loss.  The patient states that her nausea and vomiting as been on and off for about 3 years. She was started on metoclopramide 10 mg 4 times a day approximately 3 years ago. She reports that this medication did help significantly at first but it seemed to have completely lost its effectiveness she reports that nausea and vomiting are a problem for her on most days. In the past he does become so severe that it has led to DKA and required hospitalization. She has been prescribed Zofran for nausea 8 mg 3 times a day, and she reports this did help some initially but also now is not as effective. She has been taking this as many as 5 times per day. She reports that her emesis primarily consists of food that she has eaten and she has not had any hematemesis.  She reports that big portions make her nausea and vomiting worse. Abdominal pain has never been problem for her. She has an appetite but often feels full quickly and she has lost approximately 20-25 pounds over the last month. She attributes this to her nausea and vomiting. She reports that her bowel movements have been regular for her and she normally goes 3 times a day. Her stools have been brown in color and she denies blood or melena.  She reports no trouble with heartburn dysphagia or odynophagia.   Review of Systems  Constitutional: Positive for unexpected weight change. Negative for fever, chills and activity change.  HENT: Negative.   Eyes: Negative.   Respiratory: Negative.   Cardiovascular: Negative.   Gastrointestinal:       See hpi  Genitourinary: Negative.   Musculoskeletal: Negative.   Skin: Negative.   Neurological: Negative.     Hematological: Negative.   Psychiatric/Behavioral: Negative.        She reports her bipolar depression is well controlled at present.    Family History  Problem Relation Age of Onset  . Diabetes Mother   . Heart attack Mother   . Ovarian cancer Paternal Grandmother     great grandmother       Social History  . Marital Status: Married    Spouse Name: N/A    Number of Children: 2  . Years of Education: N/A   Occupational History  . homemaker    Social History Main Topics  . Smoking status: Current Everyday Smoker -- 1.0 packs/day  . Smokeless tobacco: Not on file  . Alcohol Use: No  . Drug Use: No  . Sexually Active: Not on file        Objective:   Physical Exam  Constitutional: She is oriented to person, place, and time. She appears well-developed and well-nourished. No distress.  HENT:  Head: Normocephalic and atraumatic.  Mouth/Throat: Oropharynx is clear and moist. No oropharyngeal exudate.  Eyes: EOM are normal. Pupils are equal, round, and reactive to light. No scleral icterus.  Neck: Normal range of motion. Neck supple. No tracheal deviation present. No thyromegaly present.  Cardiovascular: Normal rate, regular rhythm and normal heart sounds.   Pulmonary/Chest: Effort normal and breath sounds normal. She has no wheezes.  Abdominal: Soft. Bowel sounds are normal. She exhibits no distension and  no mass. There is no tenderness.  Lymphadenopathy:    She has no cervical adenopathy.  Neurological: She is alert and oriented to person, place, and time.  Skin: Skin is warm and dry.       Multiple tattoos noted back, abd   Psychiatric: She has a normal mood and affect.      Assessment & Plan:  This is a 31 are female with history of type 1 diabetes mellitus, bipolar depression, seizure disorder, and chronic nausea vomiting seen for nausea vomiting weight loss which is most consistent with diabetic gastroparesis.  1. Nausea/vomiting/weight loss - the patient's  symptoms are most consistent with diabetic gastroparesis however I can't rule out a structural lesion. Therefore we will schedule her for upper GI endoscopy. I have advised that while diabetic gastroparesis can be difficult to treat tight glucose control is paramount (though obviously this can be difficult control in the setting of n/v).  She has taken Reglan for 3 years now and we did discuss at length the long-term risks associated with this medication. She is aware of the possible neurologic side effects.  We did discuss the long-term use of metoclopramide has been linked to tardive dyskinesia, which may include involuntary and repetitive movements of the body even after the drug has been discontinued.  Given that it has seemed to lose effectiveness for her we will consider the use of domperidone after her upper GI endoscopy (assuming no abnormalities are noted).  In the interim I have advised her to use Zofran as directed, 8 mg no more than 3 times per day. I will give her a prescription for Compazine 5 mg every 6 hours prn for nausea not relieved by Zofran.  I will see her back in clinic after the upper endoscopy has been performed.

## 2011-04-29 ENCOUNTER — Encounter: Payer: Self-pay | Admitting: Internal Medicine

## 2011-04-29 ENCOUNTER — Ambulatory Visit (AMBULATORY_SURGERY_CENTER): Payer: Medicare Other | Admitting: Internal Medicine

## 2011-04-29 VITALS — BP 116/71 | HR 69 | Temp 98.9°F | Resp 20 | Ht 61.0 in | Wt 122.0 lb

## 2011-04-29 DIAGNOSIS — R112 Nausea with vomiting, unspecified: Secondary | ICD-10-CM

## 2011-04-29 MED ORDER — SODIUM CHLORIDE 0.9 % IV SOLN: 500.0000 mL | INTRAVENOUS | Status: DC

## 2011-04-29 NOTE — Patient Instructions (Signed)
Normal esophagus, stomach, and duodenum (all areas seen today on exam). Nausea and vomiting most likely due to diabetic gastroparesis. Continue present medications. Follow-up appt with Dr. Rhea Belton in 4-6 weeks to ensure symptoms still well managed.  Please see blue and green discharge instruction sheets and hand-out on gastroparesis.

## 2011-04-30 ENCOUNTER — Telehealth: Payer: Self-pay | Admitting: *Deleted

## 2011-04-30 NOTE — Telephone Encounter (Signed)
Spoke with pt and scheduled her to see Dr Rhea Belton as a F/U from EGD on 06/04/11 at 1100am.

## 2011-04-30 NOTE — Telephone Encounter (Signed)

## 2011-06-04 ENCOUNTER — Encounter: Payer: Self-pay | Admitting: Internal Medicine

## 2011-06-04 ENCOUNTER — Ambulatory Visit (INDEPENDENT_AMBULATORY_CARE_PROVIDER_SITE_OTHER): Payer: Medicare Other | Admitting: Internal Medicine

## 2011-06-04 VITALS — BP 118/70 | HR 74 | Ht 61.0 in | Wt 129.4 lb

## 2011-06-04 DIAGNOSIS — E1143 Type 2 diabetes mellitus with diabetic autonomic (poly)neuropathy: Secondary | ICD-10-CM | POA: Insufficient documentation

## 2011-06-04 DIAGNOSIS — E1149 Type 2 diabetes mellitus with other diabetic neurological complication: Secondary | ICD-10-CM

## 2011-06-04 DIAGNOSIS — K3184 Gastroparesis: Secondary | ICD-10-CM

## 2011-06-04 DIAGNOSIS — Z720 Tobacco use: Secondary | ICD-10-CM

## 2011-06-04 DIAGNOSIS — F17200 Nicotine dependence, unspecified, uncomplicated: Secondary | ICD-10-CM

## 2011-06-04 MED ORDER — PROCHLORPERAZINE MALEATE 5 MG PO TABS: ORAL_TABLET | ORAL | Status: DC

## 2011-06-04 NOTE — Progress Notes (Signed)
Subjective:    [name], female    DOB: 03-Jan-1976, 35 y.o.   MRN: [id number]  HPI Karen Hayden is a 35 year old female with a past medical history of diabetes, hypertension, hyperlipidemia, and seizure disorder who seen in followup for diabetic gastroparesis. I first saw Karen Hayden in August of 2012 and she suddenly underwent upper endoscopy which was normal. In Karen past she had taken metoclopramide with only moderate benefit. After her initial visit with me, she was started on Compazine 5 mg every 6 hours when necessary. She states this medication has been a "life saver" for her and dramatically improved her nausea and vomiting. She is using this medication 2-4 times daily.  She has subsequently stopped metoclopramide and ondansetron completely.  She reports that she's been able to gain about 12 pounds since starting this medication and she is very excited about this. She reports her appetite has returned and she is minimal to no nausea and vomiting. She does have days where her nausea is more significant but still able to be controlled. Given that her diet is more consistent, her sugars have been easier to control. She denies heartburn. She reports her bowel movements are normal and formed with no blood or melena.  No f/c/ns.   Review of Systems Constitutional: Negative for fever, chills, night sweats, activity change, appetite change and unexpected weight change HEENT: Negative for sore throat, mouth sores and trouble swallowing. Eyes: Negative for visual disturbance Respiratory: Negative for cough, chest tightness and shortness of breath Cardiovascular: Negative for chest pain, palpitations and lower extremity swelling Gastrointestinal: See history of present illness Genitourinary: Negative for dysuria and hematuria. Musculoskeletal: Negative for back pain, arthralgias and myalgias Skin: Negative for rash or color change Neurological: Negative for headaches, weakness,  numbness Hematological: Negative for adenopathy, negative for easy bruising/bleeding Psychiatric/behavioral: Negative for depressed mood, negative for anxiety  Past Medical History  Diagnosis Date  . DM (diabetes mellitus)   . Diabetic gastroparesis   . Seizure disorder   . Hypertension   . Umbilical hernia   . Hyperlipidemia   . Thyroid disease    Current Outpatient Prescriptions  Medication Sig Dispense Refill  . divalproex (DEPAKOTE) 500 MG EC tablet Take 1,500 mg by mouth at bedtime.        Marland Kitchen ezetimibe (ZETIA) 10 MG tablet Take 10 mg by mouth daily.        Marland Kitchen gabapentin (NEURONTIN) 400 MG tablet Take 400 mg by mouth 3 (three) times daily.        . insulin aspart (NOVOLOG) 100 UNIT/ML injection Inject into Karen skin 3 (three) times daily before meals.        . lamoTRIgine (LAMICTAL) 100 MG tablet Take 100 mg by mouth daily.        Marland Kitchen levothyroxine (SYNTHROID, LEVOTHROID) 88 MCG tablet Take 88 mcg by mouth daily.        . minocycline (MINOCIN,DYNACIN) 100 MG capsule Take 100 mg by mouth daily.        . NON FORMULARY Novolog insulin pump as directed       . prochlorperazine (COMPAZINE) 5 MG tablet 1-2 tabs every 6 hours as needed for nausea  120 tablet  6  . ziprasidone (GEODON) 80 MG capsule Take 80 mg by mouth 2 (two) times daily with a meal.         No Known Allergies  FH: Reviewed and no change SH: Reviewed and no change, she continues to smoke cigarettes  Objective:   Physical Exam BP 118/70  Pulse 74  Ht 5\' 1"  (1.549 m)  Wt 129 lb 6.4 oz (58.695 kg)  BMI 24.45 kg/m2  SpO2 99% Constitutional: Well-developed and well-nourished. No distress. HEENT: Normocephalic and atraumatic. Oropharynx is clear and moist. No oropharyngeal exudate. Conjunctivae are normal. Pupils are equal round and reactive to light. No scleral icterus. Neck: Neck supple. Trachea midline. Cardiovascular: Normal rate, regular rhythm and intact distal pulses. No M/R/G Pulmonary/chest: Effort normal and  breath sounds normal. No wheezing, rales or rhonchi. Abdominal: Soft, nontender, nondistended. Bowel sounds active throughout. There are no masses palpable. No hepatosplenomegaly.  Insulin pump in place Lymphadenopathy: No cervical adenopathy noted. Neurological: Alert and oriented to person place and time. Skin: Skin is warm and dry. No rashes noted. Multiple tattoos noted. Psychiatric: Normal mood and affect. Behavior is normal.     Assessment & Plan:  35 year old female with a past medical history of diabetes, hypertension, hyperlipidemia, and seizure disorder who seen in followup for diabetic gastroparesis.  1. Diabetic gastroparesis -- Karen Hayden's nausea and vomiting have dramatically improved on Compazine. I also am pleased that she's been able to get off metoclopramide given Karen neurologic concerns with its use long-term.  I will refill Compazine 5-10 mg every 6 hours when necessary. I've asked that she take Karen lowest dose possible that she understands and is agreeable. We did discuss how diabetic gastroparesis can flare and be worse at times, is during these times that she can use Karen 10 mg dose. If symptoms become refractory over time, Karen next option would likely be domperidone. I will see her back in 6 month's time or sooner if needed.  2. Smoking -- Karen Hayden is aware that we feel she needs to stop smoking. At this time she is not ready to consider quitting

## 2011-06-04 NOTE — Patient Instructions (Addendum)
You have been given a separate informational sheet regarding your tobacco use, the importance of quitting and local resources to help you quit. Your prescription has been sent to your pharmacy. Please follow up in 6 months.

## 2011-10-22 DIAGNOSIS — E109 Type 1 diabetes mellitus without complications: Secondary | ICD-10-CM | POA: Diagnosis not present

## 2011-10-22 DIAGNOSIS — G609 Hereditary and idiopathic neuropathy, unspecified: Secondary | ICD-10-CM | POA: Diagnosis not present

## 2011-10-22 DIAGNOSIS — E039 Hypothyroidism, unspecified: Secondary | ICD-10-CM | POA: Diagnosis not present

## 2011-10-22 DIAGNOSIS — R05 Cough: Secondary | ICD-10-CM | POA: Diagnosis not present

## 2011-10-22 DIAGNOSIS — E7800 Pure hypercholesterolemia, unspecified: Secondary | ICD-10-CM | POA: Diagnosis not present

## 2011-10-26 DIAGNOSIS — L570 Actinic keratosis: Secondary | ICD-10-CM | POA: Diagnosis not present

## 2011-10-26 DIAGNOSIS — D235 Other benign neoplasm of skin of trunk: Secondary | ICD-10-CM | POA: Diagnosis not present

## 2011-10-26 DIAGNOSIS — L708 Other acne: Secondary | ICD-10-CM | POA: Diagnosis not present

## 2011-11-02 ENCOUNTER — Institutional Professional Consult (permissible substitution): Payer: Medicare Other | Admitting: Pulmonary Disease

## 2011-11-10 ENCOUNTER — Encounter (HOSPITAL_BASED_OUTPATIENT_CLINIC_OR_DEPARTMENT_OTHER): Payer: Self-pay | Admitting: *Deleted

## 2011-11-10 ENCOUNTER — Emergency Department (HOSPITAL_BASED_OUTPATIENT_CLINIC_OR_DEPARTMENT_OTHER)
Admission: EM | Admit: 2011-11-10 | Discharge: 2011-11-10 | Disposition: A | Discharge status: Home Health | Payer: Medicare Other | Attending: Emergency Medicine | Admitting: Emergency Medicine

## 2011-11-10 DIAGNOSIS — E119 Type 2 diabetes mellitus without complications: Secondary | ICD-10-CM | POA: Diagnosis not present

## 2011-11-10 DIAGNOSIS — R197 Diarrhea, unspecified: Secondary | ICD-10-CM | POA: Diagnosis not present

## 2011-11-10 DIAGNOSIS — E785 Hyperlipidemia, unspecified: Secondary | ICD-10-CM | POA: Insufficient documentation

## 2011-11-10 DIAGNOSIS — E1169 Type 2 diabetes mellitus with other specified complication: Secondary | ICD-10-CM | POA: Diagnosis not present

## 2011-11-10 DIAGNOSIS — R569 Unspecified convulsions: Secondary | ICD-10-CM | POA: Diagnosis not present

## 2011-11-10 DIAGNOSIS — F17200 Nicotine dependence, unspecified, uncomplicated: Secondary | ICD-10-CM | POA: Diagnosis not present

## 2011-11-10 DIAGNOSIS — R112 Nausea with vomiting, unspecified: Secondary | ICD-10-CM | POA: Insufficient documentation

## 2011-11-10 DIAGNOSIS — K3184 Gastroparesis: Secondary | ICD-10-CM | POA: Insufficient documentation

## 2011-11-10 DIAGNOSIS — Z794 Long term (current) use of insulin: Secondary | ICD-10-CM | POA: Diagnosis not present

## 2011-11-10 DIAGNOSIS — I10 Essential (primary) hypertension: Secondary | ICD-10-CM | POA: Diagnosis not present

## 2011-11-10 DIAGNOSIS — R300 Dysuria: Secondary | ICD-10-CM | POA: Insufficient documentation

## 2011-11-10 DIAGNOSIS — R109 Unspecified abdominal pain: Secondary | ICD-10-CM | POA: Insufficient documentation

## 2011-11-10 DIAGNOSIS — R739 Hyperglycemia, unspecified: Secondary | ICD-10-CM

## 2011-11-10 DIAGNOSIS — E860 Dehydration: Secondary | ICD-10-CM | POA: Diagnosis not present

## 2011-11-10 LAB — COMPREHENSIVE METABOLIC PANEL
AST: 12 U/L (ref 0–37)
Albumin: 3.9 g/dL (ref 3.5–5.2)
Alkaline Phosphatase: 81 U/L (ref 39–117)
BUN: 12 mg/dL (ref 6–23)
Chloride: 92 mEq/L — ABNORMAL LOW (ref 96–112)
Creatinine, Ser: 0.9 mg/dL (ref 0.50–1.10)
Potassium: 4.7 mEq/L (ref 3.5–5.1)
Total Bilirubin: 0.3 mg/dL (ref 0.3–1.2)
Total Protein: 7.7 g/dL (ref 6.0–8.3)

## 2011-11-10 LAB — GLUCOSE, CAPILLARY
Glucose-Capillary: 433 mg/dL — ABNORMAL HIGH (ref 70–99)
Glucose-Capillary: 184 mg/dL — ABNORMAL HIGH (ref 70–99)

## 2011-11-10 LAB — CBC
MCHC: 35.4 g/dL (ref 30.0–36.0)
Platelets: 433 10*3/uL — ABNORMAL HIGH (ref 150–400)
RDW: 12.4 % (ref 11.5–15.5)
WBC: 10.9 10*3/uL — ABNORMAL HIGH (ref 4.0–10.5)

## 2011-11-10 LAB — URINE MICROSCOPIC-ADD ON

## 2011-11-10 LAB — URINALYSIS, ROUTINE W REFLEX MICROSCOPIC
Glucose, UA: >1000 mg/dL — AB
Ketones, ur: >80 mg/dL — AB
Leukocytes, UA: NEGATIVE
Specific Gravity, Urine: 1.045 — ABNORMAL HIGH (ref 1.005–1.030)
pH: 6 (ref 5.0–8.0)

## 2011-11-10 MED ORDER — INSULIN REGULAR HUMAN 100 UNIT/ML IJ SOLN
10.0000 [IU] | Freq: Once | INTRAMUSCULAR | Status: AC
  Administered 2011-11-10: 10 [IU] via INTRAVENOUS
  Filled 2011-11-10: qty 30

## 2011-11-10 MED ORDER — SODIUM CHLORIDE 0.9 % IV BOLUS (SEPSIS): 1000.0000 mL | Freq: Once | INTRAVENOUS | Status: DC

## 2011-11-10 MED ORDER — SODIUM CHLORIDE 0.9 % IV BOLUS (SEPSIS)
1000.0000 mL | Freq: Once | INTRAVENOUS | Status: AC
  Administered 2011-11-10: 1000 mL via INTRAVENOUS

## 2011-11-10 MED ORDER — PROCHLORPERAZINE MALEATE 10 MG PO TABS
ORAL_TABLET | ORAL | Status: AC
  Administered 2011-11-10: 10 mg
  Filled 2011-11-10: qty 1

## 2011-11-10 MED ORDER — SODIUM CHLORIDE 0.9 % IV SOLN
INTRAVENOUS | Status: DC
  Administered 2011-11-10: 999 mL via INTRAVENOUS

## 2011-11-10 NOTE — ED Notes (Addendum)
Patient states that she had has n/v//d for the past two days, took compazine at 1:00am, but no relief,  Diabetic , wears insulin pump, stated glucose this AM 405 states it burns when urinating

## 2011-11-10 NOTE — ED Provider Notes (Signed)
History     CSN: [id number]  Arrival date & time 11/10/11  1248   First MD Initiated Contact with Patient 11/10/11 1355      Chief Complaint  Patient presents with  . Emesis  . Nausea  . Diarrhea    (Consider location/radiation/quality/duration/timing/severity/associated sxs/prior treatment) HPI Comments: Pt is a 36 year old woman who is an insulin-dependant diabetic who uses an insulin pump. She has a history of diabetic gastroparesis.  For 3 days she has had nausea, vomiting and diarrhea.  She has had painful urination and wonders if she has a UTI.  She took compazine without relief.  Patient is a 36 y.o. female presenting with vomiting. The history is provided by the patient. No language interpreter was used.  Emesis  This is a new problem. The current episode started more than 2 days ago. The problem occurs 5 to 10 times per day. The problem has not changed since onset.The emesis has an appearance of stomach contents. There has been no fever. Associated symptoms include abdominal pain and diarrhea. Pertinent negatives include no chills and no fever. Associated symptoms comments: Difficulty urinating, burning on urination..    Past Medical History  Diagnosis Date  . DM (diabetes mellitus)   . Diabetic gastroparesis   . Seizure disorder   . Hypertension   . Umbilical hernia   . Hyperlipidemia   . Thyroid disease     Past Surgical History  Procedure Date  . Vaginal hysterectomy   . Tonsillectomy     Family History  Problem Relation Age of Onset  . Diabetes Mother   . Heart attack Mother   . Ovarian cancer Paternal Grandmother     great grandmother    History  Substance Use Topics  . Smoking status: Current Everyday Smoker -- 1.0 packs/day    Types: Cigarettes  . Smokeless tobacco: Never Used  . Alcohol Use: No    OB History    Grav Para Term Preterm Abortions TAB SAB Ect Mult Living                  Review of Systems  Constitutional: Negative for fever  and chills.  Eyes: Negative.   Respiratory: Negative.   Cardiovascular: Negative.   Gastrointestinal: Positive for nausea, vomiting, abdominal pain and diarrhea.  Genitourinary: Positive for dysuria and difficulty urinating.  Musculoskeletal: Negative.   Skin: Negative.   Neurological: Negative.   Hematological: Negative.   Psychiatric/Behavioral: Negative.     Allergies  Review of patient's allergies indicates no known allergies.  Home Medications   Current Outpatient Rx  Name Route Sig Dispense Refill  . BACTRIM PO Oral Take by mouth.    Marland Kitchen DIVALPROEX SODIUM 500 MG PO TBEC Oral Take 1,500 mg by mouth at bedtime.      Marland Kitchen EZETIMIBE 10 MG PO TABS Oral Take 10 mg by mouth daily.      Marland Kitchen GABAPENTIN 400 MG PO TABS Oral Take 400 mg by mouth 3 (three) times daily.      . INSULIN ASPART 100 UNIT/ML [place] SOLN Subcutaneous Inject into the skin 3 (three) times daily before meals.      Marland Kitchen LAMOTRIGINE 100 MG PO TABS Oral Take 100 mg by mouth daily.      Marland Kitchen LEVOTHYROXINE SODIUM 88 MCG PO TABS Oral Take 88 mcg by mouth daily.      . NON FORMULARY  Novolog insulin pump as directed     . PROCHLORPERAZINE MALEATE 5 MG PO TABS  1-2 tabs every 6 hours as needed for nausea 120 tablet 6  . ZIPRASIDONE HCL 80 MG PO CAPS Oral Take 80 mg by mouth 2 (two) times daily with a meal.        BP 109/55  Pulse 90  Temp(Src) 97.8 F (36.6 C) (Oral)  Resp 20  SpO2 100%  Physical Exam  Nursing note and vitals reviewed. Constitutional: She is oriented to person, place, and time.       Sslender middls-aged woman who appears acutely and chronically ill.   HENT:  Head: Normocephalic and atraumatic.  Right Ear: External ear normal.  Left Ear: External ear normal.       Mouth dry  Eyes: Conjunctivae and EOM are normal. Pupils are equal, round, and reactive to light.  Neck: Normal range of motion. Neck supple.  Cardiovascular: Normal rate, regular rhythm and normal heart sounds.   Pulmonary/Chest: Effort normal  and breath sounds normal.  Abdominal: Soft. Bowel sounds are normal. Guarding: .       She has mild diffuse abdominal tenderness.  Musculoskeletal: Normal range of motion.  Lymphadenopathy:    She has no cervical adenopathy.  Neurological: She is alert and oriented to person, place, and time.       No sensory or motor deficits.  Skin: Skin is dry.  Psychiatric: She has a normal mood and affect. Her behavior is normal.    ED Course  Procedures (including critical care time)  Labs Reviewed  URINALYSIS, ROUTINE W REFLEX MICROSCOPIC - Abnormal; Notable for the following:    APPearance CLOUDY (*)    Specific Gravity, Urine 1.045 (*)    Glucose, UA >1000 (*)    Bilirubin Urine MODERATE (*)    Ketones, ur >80 (*)    All other components within normal limits  GLUCOSE, CAPILLARY - Abnormal; Notable for the following:    Glucose-Capillary 433 (*)    All other components within normal limits  URINE MICROSCOPIC-ADD ON - Abnormal; Notable for the following:    Squamous Epithelial / LPF MANY (*)    Bacteria, UA FEW (*)    All other components within normal limits  CBC - Abnormal; Notable for the following:    WBC 10.9 (*)    Platelets 433 (*)    All other components within normal limits  COMPREHENSIVE METABOLIC PANEL - Abnormal; Notable for the following:    Sodium 131 (*)    Chloride 92 (*)    Glucose, Bld 429 (*)    GFR calc non Af Amer 81 (*)    All other components within normal limits  GLUCOSE, CAPILLARY - Abnormal; Notable for the following:    Glucose-Capillary 184 (*)    All other components within normal limits   Results for orders placed during the hospital encounter of 11/10/11  URINALYSIS, ROUTINE W REFLEX MICROSCOPIC      Component Value Range   Color, Urine YELLOW  YELLOW    APPearance CLOUDY (*) CLEAR    Specific Gravity, Urine 1.045 (*) 1.005 - 1.030    pH 6.0  5.0 - 8.0    Glucose, UA >1000 (*) NEGATIVE (mg/dL)   Hgb urine dipstick NEGATIVE  NEGATIVE     Bilirubin Urine MODERATE (*) NEGATIVE    Ketones, ur >80 (*) NEGATIVE (mg/dL)   Protein, ur NEGATIVE  NEGATIVE (mg/dL)   Urobilinogen, UA 0.2  0.0 - 1.0 (mg/dL)   Nitrite NEGATIVE  NEGATIVE    Leukocytes, UA NEGATIVE  NEGATIVE   GLUCOSE,  CAPILLARY      Component Value Range   Glucose-Capillary 433 (*) 70 - 99 (mg/dL)  URINE MICROSCOPIC-ADD ON      Component Value Range   Squamous Epithelial / LPF MANY (*) RARE    WBC, UA 0-2  <3 (WBC/hpf)   Bacteria, UA FEW (*) RARE   CBC      Component Value Range   WBC 10.9 (*) 4.0 - 10.5 (K/uL)   RBC 4.79  3.87 - 5.11 (MIL/uL)   Hemoglobin 14.8  12.0 - 15.0 (g/dL)   HCT 16.1  09.6 - 04.5 (%)   MCV 87.3  78.0 - 100.0 (fL)   MCH 30.9  26.0 - 34.0 (pg)   MCHC 35.4  30.0 - 36.0 (g/dL)   RDW 40.9  81.1 - 91.4 (%)   Platelets 433 (*) 150 - 400 (K/uL)  COMPREHENSIVE METABOLIC PANEL      Component Value Range   Sodium 131 (*) 135 - 145 (mEq/L)   Potassium 4.7  3.5 - 5.1 (mEq/L)   Chloride 92 (*) 96 - 112 (mEq/L)   CO2 20  19 - 32 (mEq/L)   Glucose, Bld 429 (*) 70 - 99 (mg/dL)   BUN 12  6 - 23 (mg/dL)   Creatinine, Ser 7.82  0.50 - 1.10 (mg/dL)   Calcium 95.6  8.4 - 10.5 (mg/dL)   Total Protein 7.7  6.0 - 8.3 (g/dL)   Albumin 3.9  3.5 - 5.2 (g/dL)   AST 12  0 - 37 (U/L)   ALT 11  0 - 35 (U/L)   Alkaline Phosphatase 81  39 - 117 (U/L)   Total Bilirubin 0.3  0.3 - 1.2 (mg/dL)   GFR calc non Af Amer 81 (*) >90 (mL/min)   GFR calc Af Amer >90  >90 (mL/min)  GLUCOSE, CAPILLARY      Component Value Range   Glucose-Capillary 184 (*) 70 - 99 (mg/dL)    2:13 PM Pt has received 3 liters of normal saline, her blood sugar is down to 184, and her lab tests do not show DKA.  She is feeling better, ready to go home.  I advised her to turn her insulin pump back on, to take clear liquids this evening, and to call Dr. Talmage Nap tomorrow morning to report how she is doing.   1. Hyperglycemia   2. Gastroparesis           Carleene Cooper III,  MD 11/10/11 2051

## 2011-11-12 DIAGNOSIS — E039 Hypothyroidism, unspecified: Secondary | ICD-10-CM | POA: Diagnosis not present

## 2011-11-12 DIAGNOSIS — E109 Type 1 diabetes mellitus without complications: Secondary | ICD-10-CM | POA: Diagnosis not present

## 2011-11-12 DIAGNOSIS — G609 Hereditary and idiopathic neuropathy, unspecified: Secondary | ICD-10-CM | POA: Diagnosis not present

## 2011-11-12 DIAGNOSIS — N390 Urinary tract infection, site not specified: Secondary | ICD-10-CM | POA: Diagnosis not present

## 2011-11-12 DIAGNOSIS — R300 Dysuria: Secondary | ICD-10-CM | POA: Diagnosis not present

## 2011-11-12 DIAGNOSIS — E7800 Pure hypercholesterolemia, unspecified: Secondary | ICD-10-CM | POA: Diagnosis not present

## 2011-11-17 ENCOUNTER — Institutional Professional Consult (permissible substitution): Payer: Medicare Other | Admitting: Pulmonary Disease

## 2011-12-08 ENCOUNTER — Institutional Professional Consult (permissible substitution): Payer: Medicare Other | Admitting: Pulmonary Disease

## 2012-01-20 ENCOUNTER — Encounter: Payer: Self-pay | Admitting: Internal Medicine

## 2012-01-20 ENCOUNTER — Ambulatory Visit (INDEPENDENT_AMBULATORY_CARE_PROVIDER_SITE_OTHER): Payer: Medicare Other | Admitting: Internal Medicine

## 2012-01-20 VITALS — BP 90/66 | HR 76 | Ht 61.0 in | Wt 130.4 lb

## 2012-01-20 DIAGNOSIS — E119 Type 2 diabetes mellitus without complications: Secondary | ICD-10-CM | POA: Diagnosis not present

## 2012-01-20 DIAGNOSIS — R112 Nausea with vomiting, unspecified: Secondary | ICD-10-CM | POA: Diagnosis not present

## 2012-01-20 DIAGNOSIS — K3184 Gastroparesis: Secondary | ICD-10-CM | POA: Diagnosis not present

## 2012-01-20 MED ORDER — AMBULATORY NON FORMULARY MEDICATION: 10.0000 mg | Freq: Three times a day (TID) | Status: DC

## 2012-01-20 MED ORDER — PROMETHAZINE HCL 25 MG PO TABS: 25.0000 mg | ORAL_TABLET | Freq: Four times a day (QID) | ORAL | Status: DC | PRN

## 2012-01-20 MED ORDER — AMBULATORY NON FORMULARY MEDICATION: 10.0000 mg | Freq: Three times a day (TID) | Status: DC | PRN

## 2012-01-20 NOTE — Patient Instructions (Addendum)
You have been given a separate informational sheet regarding your tobacco use, the importance of quitting and local resources to help you quit.  We have sent the following medications to your pharmacy for you to pick up at your convenience: Domperidone; the pharmacy will be calling you to get your address so they can ship it to you. Phenegren was sent to your local pharmacy.  Follow up as needed

## 2012-01-20 NOTE — Progress Notes (Signed)
Subjective:    Patient ID: Karen Hayden, female    DOB: [birth year], 36 y.o.   MRN: [id number]  HPI Ms. Abruzzese is a 36 year old female with a past medical history of diabetes, hypertension, hyperlipidemia, and seizure disorder who seen in followup for diabetic gastroparesis, nausea and vomiting. She was last seen in October of 2012 at which point she was doing very well from a nausea and vomiting standpoint on Compazine. Today she returns stating that over the last 1-2 months she's had recurrent nausea and vomiting, which is worse after eating. This is despite the use of Compazine. She feels that this medication has lost its effect. She admits to taking it more frequently than prescribed, which she knows she should not do. She's been taking about 15 - 20 mg 3 times a day. She reports ongoing poor appetite, and notes that her blood sugars have been particular a high difficult to control lately. She did have a hospital visit in March with near DKA, elevated ketones and hyperglycemia. She was hydrated during that visit, received antibiotics, and discharged home. She denies abdominal pain today. No trouble with bowel movements, and she's been regular without blood in her stool or melena. No fevers or chills. No trouble with heartburn, dysphagia or odynophagia  Review of Systems As per history of present illness, otherwise negative  Current Medications, Allergies, Past Medical History, Past Surgical History, Family History and Social History were reviewed in [employer] record.     Objective:   Physical Exam BP 90/66  Pulse 76  Ht 5\' 1"  (1.549 m)  Wt 130 lb 6.4 oz (59.149 kg)  BMI 24.64 kg/m2 Constitutional: Well-developed and well-nourished. No distress. HEENT: Normocephalic and atraumatic. Oropharynx is clear and moist. No oropharyngeal exudate. Conjunctivae are normal. Pupils are equal round and reactive to light. No scleral icterus. Neck: Neck supple. Trachea  midline. Cardiovascular: Normal rate, regular rhythm and intact distal pulses. No M/R/G Pulmonary/chest: Effort normal with mild end expiratory wheezes, no rales or rhonchi Abdominal: Soft, nontender, mildly distended without rebound or guarding. Bowel sounds active throughout. There are no masses palpable. No hepatosplenomegaly. Extremities: no clubbing, cyanosis, or edema Lymphadenopathy: No cervical adenopathy noted. Neurological: Alert and oriented to person place and time. Skin: Skin is warm and dry. No rashes noted. Multiple tattoos noted Psychiatric: Normal mood and affect. Behavior is normal.  Prior endoscopy record reviewed in her medical record    Assessment & Plan:  36 year old female with a past medical history of diabetes, hypertension, hyperlipidemia, and seizure disorder who seen in followup for diabetic gastroparesis, nausea and vomiting.  1. Diabetic gastroparesis -- overall the patient's nausea and vomiting is very consistent with diabetic gastroparesis. It does fluctuate over time, and she notes it to be worse when her sugars are higher and more erratic. She does have times when she has not had trouble with nausea and vomiting. She's had no new or other alarm symptoms. At this point she has lost response to prochlorperazine, but has seemed to benefit from promethazine given by the emergency department. When we last spoke in clinic, we discussed domperidone is an option, due to its better safety profile compared to chronic metoclopramide therapy. For now I have given her prescription for promethazine 25 mg to be used every 6 hours as needed for nausea. I will also prescribe domperidone 10 mg by mouth 3 times a day when necessary for nausea and vomiting. Regarding domperidone, I would like for her to use this only  as needed, and the lowest effective dose and for the shortest period possible. She voices understanding. She will get this medication for a pharmacy in [place]. I've asked  that she call us and update [place] on her progress when she began this medication, and she voices understanding. I will see her back in clinic in 3 months time to reassess her symptoms and response to the new medication.

## 2012-02-16 ENCOUNTER — Other Ambulatory Visit: Payer: Self-pay | Admitting: Internal Medicine

## 2012-02-16 DIAGNOSIS — K3184 Gastroparesis: Secondary | ICD-10-CM

## 2012-02-16 MED ORDER — AMBULATORY NON FORMULARY MEDICATION: 10.0000 mg | Freq: Three times a day (TID) | Status: DC | PRN

## 2012-02-16 NOTE — Telephone Encounter (Signed)
Resent Rx to pharmacy in [place]

## 2012-02-17 DIAGNOSIS — E039 Hypothyroidism, unspecified: Secondary | ICD-10-CM | POA: Diagnosis not present

## 2012-02-17 DIAGNOSIS — E109 Type 1 diabetes mellitus without complications: Secondary | ICD-10-CM | POA: Diagnosis not present

## 2012-02-17 DIAGNOSIS — G609 Hereditary and idiopathic neuropathy, unspecified: Secondary | ICD-10-CM | POA: Diagnosis not present

## 2012-02-17 DIAGNOSIS — E7800 Pure hypercholesterolemia, unspecified: Secondary | ICD-10-CM | POA: Diagnosis not present

## 2012-02-25 DIAGNOSIS — E119 Type 2 diabetes mellitus without complications: Secondary | ICD-10-CM | POA: Diagnosis not present

## 2012-04-19 ENCOUNTER — Encounter: Payer: Self-pay | Admitting: Internal Medicine

## 2012-04-20 ENCOUNTER — Encounter: Payer: Self-pay | Admitting: Internal Medicine

## 2012-04-20 ENCOUNTER — Ambulatory Visit (INDEPENDENT_AMBULATORY_CARE_PROVIDER_SITE_OTHER): Payer: Medicare Other | Admitting: Internal Medicine

## 2012-04-20 ENCOUNTER — Ambulatory Visit: Payer: Medicare Other

## 2012-04-20 ENCOUNTER — Other Ambulatory Visit: Payer: Medicare Other

## 2012-04-20 ENCOUNTER — Telehealth: Payer: Self-pay | Admitting: Gastroenterology

## 2012-04-20 VITALS — BP 100/62 | HR 88 | Ht 61.0 in | Wt 139.2 lb

## 2012-04-20 DIAGNOSIS — K3184 Gastroparesis: Secondary | ICD-10-CM | POA: Diagnosis not present

## 2012-04-20 DIAGNOSIS — R300 Dysuria: Secondary | ICD-10-CM

## 2012-04-20 LAB — URINALYSIS WITH CULTURE, IF INDICATED
Bilirubin Urine: NEGATIVE
Leukocytes, UA: NEGATIVE
Nitrite: NEGATIVE
Specific Gravity, Urine: 1.01 (ref 1.000–1.030)
Total Protein, Urine: NEGATIVE
pH: 6.5 (ref 5.0–8.0)

## 2012-04-20 MED ORDER — PROMETHAZINE HCL 25 MG PO TABS
25.0000 mg | ORAL_TABLET | Freq: Four times a day (QID) | ORAL | Status: DC | PRN
Start: 2012-04-20 — End: 2012-07-07

## 2012-04-20 MED ORDER — AMBULATORY NON FORMULARY MEDICATION: 10.0000 mg | Freq: Three times a day (TID) | Status: DC | PRN

## 2012-04-20 NOTE — Progress Notes (Addendum)
  Subjective:    Patient ID: Karen Hayden, female    DOB: 1975-09-17, 36 y.o.   MRN: [id number]  HPI Ms. Penaflor is a 36 year old female with a past medical history of diabetes, hypertension, hyperlipidemia, and seizure disorder who seen in followup for diabetic gastroparesis.  After her last visit she was started on domperidone 10 mg 3 times a day a.c. she has done very well with this medication and reports dramatic improvement in her nausea and vomiting, as well as her ability to eat. She has been able to gain weight and is very happy with this medication. She denies early satiety. No trouble with heartburn. She rarely has breakthrough nausea and for this has been using Phenergan with good relief. This has worked better for her than Compazine. She's requesting refill her Phenergan today, though notes she uses it rarely. No fevers or chills. No abdominal pain today. She does describe urinary complaints specifically she reports dysuria, frequency, hesitancy, and some lower back discomfort. She reports feeling as if she has a UTI  Review of Systems As per history of present illness, otherwise negative  Current Medications, Allergies, Past Medical History, Past Surgical History, Family History and Social History were reviewed in [employer] record.      Objective:   Physical Exam BP 100/62  Pulse 88  Ht 5\' 1"  (1.549 m)  Wt 139 lb 3.2 oz (63.141 kg)  BMI 26.30 kg/m2 Constitutional: Well-developed and well-nourished. No distress. HEENT: Normocephalic and atraumatic. Oropharynx is clear and moist. No oropharyngeal exudate. Conjunctivae are normal.  No scleral icterus. Neck: Neck supple. Trachea midline. Cardiovascular: Normal rate, regular rhythm and intact distal pulses. No M/R/G Pulmonary/chest: Effort normal and breath sounds normal. No wheezing, rales or rhonchi. Abdominal: Soft, nontender, nondistended. Bowel sounds active throughout. There are no masses palpable. No  hepatosplenomegaly. Insulin pump in place Extremities: no clubbing, cyanosis, or edema Lymphadenopathy: No cervical adenopathy noted. Neurological: Alert and oriented to person place and time. Skin: Skin is warm and dry. No rashes noted. Psychiatric: Normal mood and affect. Behavior is normal.    Assessment & Plan:  1. Diabetic gastroparesis -- overall she's had a very good response to domperidone therapy, and for now we will continue this 10 mg 3 times a day before meals. I'm pleased that she's been able to eliminate metoclopramide in the risk for negative side effects down the line. She will continue to use promethazine as needed for breakthrough nausea. She is aware that ultimately tight glucose control will make her gastroparesis symptoms better, and she is aware of the gastroparesis diet. I'll see her back in 6 months or sooner if necessary  2. Dysuria/hesitancy/lower back pain -- urinalysis will be performed with culture today. If there is evidence for UTI will treat with antibiotic  Addendum: Urinalysis is reviewed and reveals positive hemoglobin and red cells without leukocyte esterase or nitrites. She has had a hysterectomy, and therefore this raises concern, positive for kidney stone. She will need further workup and I will direct her to her primary care provider for further evaluation and workup. No antibiotics at this point has no obvious infection, culture pending.

## 2012-04-20 NOTE — Patient Instructions (Addendum)
You have been given a separate informational sheet regarding your tobacco use, the importance of quitting and local resources to help you quit.  We have sent the following medications to your pharmacy for you to pick up at your convenience: Phenergren and Domparadone. Please take all medications as directed.    Follow up with Dr. Rhea Belton in 6 months

## 2012-04-20 NOTE — Telephone Encounter (Signed)
Spoke to pt. Told her U/A came back with only blood in urine and per Dr. Rhea Belton doesn't show signs of infection, he recommends she follow up with her PCP. Pt stated understanding.

## 2012-04-22 LAB — URINE CULTURE: Colony Count: 30000

## 2012-05-11 DIAGNOSIS — N200 Calculus of kidney: Secondary | ICD-10-CM | POA: Diagnosis not present

## 2012-05-11 DIAGNOSIS — N390 Urinary tract infection, site not specified: Secondary | ICD-10-CM | POA: Diagnosis not present

## 2012-05-11 DIAGNOSIS — R109 Unspecified abdominal pain: Secondary | ICD-10-CM | POA: Diagnosis not present

## 2012-05-11 DIAGNOSIS — Z23 Encounter for immunization: Secondary | ICD-10-CM | POA: Diagnosis not present

## 2012-06-10 DIAGNOSIS — F3162 Bipolar disorder, current episode mixed, moderate: Secondary | ICD-10-CM | POA: Diagnosis not present

## 2012-06-22 DIAGNOSIS — E109 Type 1 diabetes mellitus without complications: Secondary | ICD-10-CM | POA: Diagnosis not present

## 2012-07-07 ENCOUNTER — Telehealth: Payer: Self-pay | Admitting: Internal Medicine

## 2012-07-07 MED ORDER — PROMETHAZINE HCL 25 MG PO TABS: 25.0000 mg | ORAL_TABLET | Freq: Four times a day (QID) | ORAL | Status: DC | PRN

## 2012-07-07 NOTE — Telephone Encounter (Signed)
Sent promethazine to pt's pharmacy

## 2012-08-19 ENCOUNTER — Other Ambulatory Visit: Payer: Self-pay | Admitting: Gastroenterology

## 2012-08-19 DIAGNOSIS — R112 Nausea with vomiting, unspecified: Secondary | ICD-10-CM

## 2012-08-19 MED ORDER — PROCHLORPERAZINE MALEATE 5 MG PO TABS: 5.0000 mg | ORAL_TABLET | Freq: Four times a day (QID) | ORAL | Status: DC | PRN

## 2012-09-08 ENCOUNTER — Encounter: Payer: Self-pay | Admitting: Internal Medicine

## 2012-09-09 ENCOUNTER — Ambulatory Visit (INDEPENDENT_AMBULATORY_CARE_PROVIDER_SITE_OTHER): Payer: Medicare Other | Admitting: Internal Medicine

## 2012-09-09 ENCOUNTER — Encounter: Payer: Self-pay | Admitting: Internal Medicine

## 2012-09-09 VITALS — BP 110/64 | HR 65 | Ht 61.0 in | Wt 147.0 lb

## 2012-09-09 DIAGNOSIS — K3184 Gastroparesis: Secondary | ICD-10-CM | POA: Diagnosis not present

## 2012-09-09 MED ORDER — PROMETHAZINE HCL 25 MG PO TABS: 25.0000 mg | ORAL_TABLET | Freq: Four times a day (QID) | ORAL | Status: DC | PRN

## 2012-09-09 MED ORDER — AMBULATORY NON FORMULARY MEDICATION: 10.0000 mg | Freq: Three times a day (TID) | Status: DC | PRN

## 2012-09-09 NOTE — Patient Instructions (Addendum)
We have sent the following medications to your pharmacy for you to pick up at your convenience: Domperidone, Promethazine  Follow up in 1 year

## 2012-09-09 NOTE — Progress Notes (Signed)
  Subjective:    Patient ID: Karen Hayden, female    DOB: 02-24-1976, 37 y.o.   MRN: [id number]  HPI Ms. Warth is a 37 year old female with a past medical history of diabetes, hypertension, hyperlipidemia, and seizure disorder who seen in followup for diabetic gastroparesis. Renae is doing so well now. Her nausea is dramatically under better control and she continues on domperidone 10 mg 3 times a day a.c.  she isn't using Phenergan 25 mg for breakthrough, but not eating as often. She's not having any vomiting. She is now able to eat 3 meals per day. She's been able to gain approximately 30 pounds with improvement in her diet. She's also had dramatic improvement in her glucose control with her more consistent diet and lack of vomiting. She reports her A1c is decreased from 9.8-5.6. Overall she is very happy and thanks [place] for our care.  Review of Systems As per history of present illness, otherwise negative  Current Medications, Allergies, Past Medical History, Past Surgical History, Family History and Social History were reviewed in [employer] record.     Objective:   Physical Exam BP 110/64  Pulse 65  Ht 5\' 1"  (1.549 m)  Wt 147 lb (66.679 kg)  BMI 27.78 kg/m2 Constitutional: Well-developed and well-nourished. No distress. HEENT: Normocephalic and atraumatic. Oropharynx is clear and moist. No oropharyngeal exudate. Conjunctivae are normal.  No scleral icterus. Cardiovascular: Normal rate, regular rhythm and intact distal pulses. No M/R/G Pulmonary/chest: Effort normal and breath sounds normal. No wheezing, rales or rhonchi. Abdominal: Soft, nontender, nondistended. Bowel sounds active throughout. There are no masses palpable. No hepatosplenomegaly. Insulin pump in place Extremities: no clubbing, cyanosis, or edema Neurological: Alert and oriented to person place and time. Skin: Skin is warm and dry. No rashes noted. Psychiatric: Normal mood and affect. Behavior is  normal.     Assessment & Plan:  37 year old female with a past medical history of diabetes, hypertension, hyperlipidemia, and seizure disorder who seen in followup for diabetic gastroparesis.  1.  Diabetic gastroparesis --  overall she's had an excellent response to domperidone therapy, and for now we will continue this 10 mg 3 times a day before meals. It is also allow for better glucose control and her A1c is dramatically better. She's also been able to gain weight for which she is thankful. I will refill her Phenergan to be used as needed for breakthrough nausea. I will see her back in one year or sooner if necessary. I'm pleased that she's been able to eliminate metoclopramide in the risk for negative side effects down the line. She will continue to use promethazine as needed for breakthrough nausea. She is aware that ultimately tight glucose control will make her gastroparesis symptoms better, and she is aware of the gastroparesis diet. I'll see her back in 6 months or sooner if necessary

## 2012-11-17 DIAGNOSIS — G609 Hereditary and idiopathic neuropathy, unspecified: Secondary | ICD-10-CM | POA: Diagnosis not present

## 2012-11-17 DIAGNOSIS — E109 Type 1 diabetes mellitus without complications: Secondary | ICD-10-CM | POA: Diagnosis not present

## 2012-11-17 DIAGNOSIS — E039 Hypothyroidism, unspecified: Secondary | ICD-10-CM | POA: Diagnosis not present

## 2012-11-17 DIAGNOSIS — E7800 Pure hypercholesterolemia, unspecified: Secondary | ICD-10-CM | POA: Diagnosis not present

## 2012-12-01 ENCOUNTER — Other Ambulatory Visit: Payer: Self-pay | Admitting: Internal Medicine

## 2012-12-01 NOTE — Telephone Encounter (Signed)
Provider did not prescribe this medication

## 2012-12-20 DIAGNOSIS — E109 Type 1 diabetes mellitus without complications: Secondary | ICD-10-CM | POA: Diagnosis not present

## 2012-12-20 DIAGNOSIS — G609 Hereditary and idiopathic neuropathy, unspecified: Secondary | ICD-10-CM | POA: Diagnosis not present

## 2012-12-20 DIAGNOSIS — E7800 Pure hypercholesterolemia, unspecified: Secondary | ICD-10-CM | POA: Diagnosis not present

## 2012-12-20 DIAGNOSIS — E039 Hypothyroidism, unspecified: Secondary | ICD-10-CM | POA: Diagnosis not present

## 2012-12-20 DIAGNOSIS — E559 Vitamin D deficiency, unspecified: Secondary | ICD-10-CM | POA: Diagnosis not present

## 2013-01-24 ENCOUNTER — Emergency Department (HOSPITAL_BASED_OUTPATIENT_CLINIC_OR_DEPARTMENT_OTHER)
Admission: EM | Admit: 2013-01-24 | Discharge: 2013-01-24 | Disposition: A | Discharge status: Home / Self Care | Payer: Medicare Other | Attending: Emergency Medicine | Admitting: Emergency Medicine

## 2013-01-24 ENCOUNTER — Encounter (HOSPITAL_BASED_OUTPATIENT_CLINIC_OR_DEPARTMENT_OTHER): Payer: Self-pay | Admitting: *Deleted

## 2013-01-24 ENCOUNTER — Emergency Department (HOSPITAL_BASED_OUTPATIENT_CLINIC_OR_DEPARTMENT_OTHER): Payer: Medicare Other

## 2013-01-24 DIAGNOSIS — I10 Essential (primary) hypertension: Secondary | ICD-10-CM | POA: Diagnosis not present

## 2013-01-24 DIAGNOSIS — S59909A Unspecified injury of unspecified elbow, initial encounter: Secondary | ICD-10-CM | POA: Insufficient documentation

## 2013-01-24 DIAGNOSIS — F319 Bipolar disorder, unspecified: Secondary | ICD-10-CM | POA: Insufficient documentation

## 2013-01-24 DIAGNOSIS — Z8719 Personal history of other diseases of the digestive system: Secondary | ICD-10-CM | POA: Insufficient documentation

## 2013-01-24 DIAGNOSIS — F17200 Nicotine dependence, unspecified, uncomplicated: Secondary | ICD-10-CM | POA: Diagnosis not present

## 2013-01-24 DIAGNOSIS — E785 Hyperlipidemia, unspecified: Secondary | ICD-10-CM | POA: Insufficient documentation

## 2013-01-24 DIAGNOSIS — E079 Disorder of thyroid, unspecified: Secondary | ICD-10-CM | POA: Insufficient documentation

## 2013-01-24 DIAGNOSIS — E1149 Type 2 diabetes mellitus with other diabetic neurological complication: Secondary | ICD-10-CM | POA: Insufficient documentation

## 2013-01-24 DIAGNOSIS — X500XXA Overexertion from strenuous movement or load, initial encounter: Secondary | ICD-10-CM | POA: Insufficient documentation

## 2013-01-24 DIAGNOSIS — S6990XA Unspecified injury of unspecified wrist, hand and finger(s), initial encounter: Secondary | ICD-10-CM | POA: Diagnosis not present

## 2013-01-24 DIAGNOSIS — K3184 Gastroparesis: Secondary | ICD-10-CM | POA: Diagnosis not present

## 2013-01-24 DIAGNOSIS — M25532 Pain in left wrist: Secondary | ICD-10-CM

## 2013-01-24 DIAGNOSIS — M25539 Pain in unspecified wrist: Secondary | ICD-10-CM | POA: Diagnosis not present

## 2013-01-24 DIAGNOSIS — Z79899 Other long term (current) drug therapy: Secondary | ICD-10-CM | POA: Insufficient documentation

## 2013-01-24 DIAGNOSIS — G40909 Epilepsy, unspecified, not intractable, without status epilepticus: Secondary | ICD-10-CM | POA: Diagnosis not present

## 2013-01-24 DIAGNOSIS — Y9389 Activity, other specified: Secondary | ICD-10-CM | POA: Insufficient documentation

## 2013-01-24 DIAGNOSIS — Y929 Unspecified place or not applicable: Secondary | ICD-10-CM | POA: Insufficient documentation

## 2013-01-24 HISTORY — DX: Bipolar disorder, unspecified: F31.9

## 2013-01-24 NOTE — ED Provider Notes (Signed)
  Medical screening examination/treatment/procedure(s) were performed by non-physician practitioner and as supervising physician I was immediately available for consultation/collaboration.    Suesan Mohrmann D Waris Rodger, MD 01/24/13 2029 

## 2013-01-24 NOTE — ED Provider Notes (Signed)
History     CSN: [id number]  Arrival date & time 01/24/13  1746   First MD Initiated Contact with Patient 01/24/13 1808      Chief Complaint  Patient presents with  . Wrist Injury    left    (Consider location/radiation/quality/duration/timing/severity/associated sxs/prior treatment) HPI Comments: Pt states that she twisted her wrist open a dog food bag and felt a pop and has been having pain since  Patient is a 37 y.o. female presenting with wrist injury. The history is provided by the patient. No language interpreter was used.  Wrist Injury Location:  Wrist Time since incident:  4 days Injury: yes   Wrist location:  L wrist Pain details:    Quality:  Aching   Radiates to:  L fingers   Severity:  Mild   Onset quality:  Sudden   Timing:  Constant   Progression:  Unchanged   Past Medical History  Diagnosis Date  . DM (diabetes mellitus)   . Diabetic gastroparesis   . Seizure disorder   . Hypertension   . Umbilical hernia   . Hyperlipidemia   . Thyroid disease   . Gastroparesis     Due to diabetes   . Bipolar 1 disorder     Past Surgical History  Procedure Laterality Date  . Vaginal hysterectomy    . Tonsillectomy      Family History  Problem Relation Age of Onset  . Diabetes Mother   . Heart attack Mother   . Ovarian cancer Paternal Grandmother     great grandmother    History  Substance Use Topics  . Smoking status: Current Every Day Smoker -- 1.00 packs/day    Types: Cigarettes  . Smokeless tobacco: Never Used  . Alcohol Use: No    OB History   Grav Para Term Preterm Abortions TAB SAB Ect Mult Living                  Review of Systems  Constitutional: Negative.   Respiratory: Negative.   Cardiovascular: Negative.     Allergies  Review of patient's allergies indicates no known allergies.  Home Medications   Current Outpatient Rx  Name  Route  Sig  Dispense  Refill  . promethazine (PHENERGAN) 25 MG tablet   Oral   Take 25 mg by  mouth every 6 (six) hours as needed for nausea.         . AMBULATORY NON FORMULARY MEDICATION      Humalog Pump  Use as directed         . AMBULATORY NON FORMULARY MEDICATION   Oral   Take 10 mg by mouth 3 (three) times daily with meals as needed. Medication Name: Domperidone   240 tablet   11   . divalproex (DEPAKOTE) 500 MG EC tablet   Oral   Take 1,500 mg by mouth at bedtime.           Marland Kitchen ezetimibe (ZETIA) 10 MG tablet   Oral   Take 10 mg by mouth daily.           Marland Kitchen gabapentin (NEURONTIN) 400 MG tablet   Oral   Take 400 mg by mouth 3 (three) times daily.           Marland Kitchen lamoTRIgine (LAMICTAL) 150 MG tablet   Oral   Take 150 mg by mouth daily.         Marland Kitchen levothyroxine (SYNTHROID, LEVOTHROID) 100 MCG tablet   Oral  Take 100 mcg by mouth daily.         Marland Kitchen OLANZapine (ZYPREXA) 10 MG tablet   Oral   Take 10 mg by mouth at bedtime.         . Sulfamethoxazole-Trimethoprim (BACTRIM PO)   Oral   Take 500 mg by mouth 2 (two) times daily.            There were no vitals taken for this visit.  Physical Exam  Nursing note and vitals reviewed. Constitutional: She is oriented to person, place, and time. She appears well-developed and well-nourished.  Cardiovascular: Normal rate and regular rhythm.   Pulmonary/Chest: Effort normal and breath sounds normal.  Musculoskeletal: Normal range of motion.  Pt has full rom of left wrist:no gross deformity or swelling noted  Neurological: She is alert and oriented to person, place, and time.  Skin: Skin is warm and dry.    ED Course  Procedures (including critical care time)  Labs Reviewed - No data to display Dg Wrist Complete Left  01/24/2013   *RADIOLOGY REPORT*  Clinical Data: Injury, pain  LEFT WRIST - COMPLETE 3+ VIEW  Comparison: None.  Findings: Normal alignment without fracture.  Distal radius, ulna and carpal bones intact.  No soft tissue abnormality or focal swelling.  IMPRESSION: No acute osseous finding    Original Report Authenticated By: Judie Petit. Shick, M.D.     1. Wrist pain, left       MDM  No acute bony abnormality noted:pt is okay to follow up with dr. Hudnall:pt placed in splint for comfort        Teressa Lower, NP 01/24/13 [phone]

## 2013-01-24 NOTE — ED Notes (Signed)
Patient states she was opening a bag of dog food 3 days ago.  States she twisted her left wrist and felt a pop.  States she had moderate swelling in her dorsal hand and fingers.  Using otc advil for pain.

## 2013-03-20 ENCOUNTER — Encounter: Payer: Self-pay | Admitting: Internal Medicine

## 2013-03-22 ENCOUNTER — Encounter: Payer: Self-pay | Admitting: Internal Medicine

## 2013-03-23 ENCOUNTER — Ambulatory Visit: Payer: Medicare Other | Admitting: Internal Medicine

## 2013-03-28 DIAGNOSIS — G609 Hereditary and idiopathic neuropathy, unspecified: Secondary | ICD-10-CM | POA: Diagnosis not present

## 2013-03-28 DIAGNOSIS — E039 Hypothyroidism, unspecified: Secondary | ICD-10-CM | POA: Diagnosis not present

## 2013-03-28 DIAGNOSIS — E7800 Pure hypercholesterolemia, unspecified: Secondary | ICD-10-CM | POA: Diagnosis not present

## 2013-03-28 DIAGNOSIS — E109 Type 1 diabetes mellitus without complications: Secondary | ICD-10-CM | POA: Diagnosis not present

## 2013-04-24 ENCOUNTER — Emergency Department (HOSPITAL_BASED_OUTPATIENT_CLINIC_OR_DEPARTMENT_OTHER)
Admission: EM | Admit: 2013-04-24 | Discharge: 2013-04-24 | Disposition: A | Discharge status: Home / Self Care | Payer: Medicare Other | Attending: Emergency Medicine | Admitting: Emergency Medicine

## 2013-04-24 ENCOUNTER — Encounter (HOSPITAL_BASED_OUTPATIENT_CLINIC_OR_DEPARTMENT_OTHER): Payer: Self-pay

## 2013-04-24 ENCOUNTER — Emergency Department (HOSPITAL_BASED_OUTPATIENT_CLINIC_OR_DEPARTMENT_OTHER): Payer: Medicare Other

## 2013-04-24 DIAGNOSIS — E1149 Type 2 diabetes mellitus with other diabetic neurological complication: Secondary | ICD-10-CM | POA: Diagnosis not present

## 2013-04-24 DIAGNOSIS — F17200 Nicotine dependence, unspecified, uncomplicated: Secondary | ICD-10-CM | POA: Insufficient documentation

## 2013-04-24 DIAGNOSIS — Z8719 Personal history of other diseases of the digestive system: Secondary | ICD-10-CM | POA: Diagnosis not present

## 2013-04-24 DIAGNOSIS — Z862 Personal history of diseases of the blood and blood-forming organs and certain disorders involving the immune mechanism: Secondary | ICD-10-CM | POA: Insufficient documentation

## 2013-04-24 DIAGNOSIS — R1031 Right lower quadrant pain: Secondary | ICD-10-CM | POA: Diagnosis not present

## 2013-04-24 DIAGNOSIS — E039 Hypothyroidism, unspecified: Secondary | ICD-10-CM | POA: Diagnosis not present

## 2013-04-24 DIAGNOSIS — R7309 Other abnormal glucose: Secondary | ICD-10-CM | POA: Diagnosis not present

## 2013-04-24 DIAGNOSIS — L089 Local infection of the skin and subcutaneous tissue, unspecified: Secondary | ICD-10-CM | POA: Insufficient documentation

## 2013-04-24 DIAGNOSIS — I10 Essential (primary) hypertension: Secondary | ICD-10-CM | POA: Insufficient documentation

## 2013-04-24 DIAGNOSIS — K3184 Gastroparesis: Secondary | ICD-10-CM | POA: Insufficient documentation

## 2013-04-24 DIAGNOSIS — E109 Type 1 diabetes mellitus without complications: Secondary | ICD-10-CM | POA: Diagnosis not present

## 2013-04-24 DIAGNOSIS — E079 Disorder of thyroid, unspecified: Secondary | ICD-10-CM | POA: Insufficient documentation

## 2013-04-24 DIAGNOSIS — E119 Type 2 diabetes mellitus without complications: Secondary | ICD-10-CM | POA: Diagnosis not present

## 2013-04-24 DIAGNOSIS — G609 Hereditary and idiopathic neuropathy, unspecified: Secondary | ICD-10-CM | POA: Diagnosis not present

## 2013-04-24 DIAGNOSIS — F319 Bipolar disorder, unspecified: Secondary | ICD-10-CM | POA: Diagnosis not present

## 2013-04-24 DIAGNOSIS — Z79899 Other long term (current) drug therapy: Secondary | ICD-10-CM | POA: Diagnosis not present

## 2013-04-24 DIAGNOSIS — Z8639 Personal history of other endocrine, nutritional and metabolic disease: Secondary | ICD-10-CM | POA: Insufficient documentation

## 2013-04-24 DIAGNOSIS — G40909 Epilepsy, unspecified, not intractable, without status epilepticus: Secondary | ICD-10-CM | POA: Insufficient documentation

## 2013-04-24 DIAGNOSIS — L0291 Cutaneous abscess, unspecified: Secondary | ICD-10-CM | POA: Diagnosis not present

## 2013-04-24 DIAGNOSIS — J449 Chronic obstructive pulmonary disease, unspecified: Secondary | ICD-10-CM | POA: Diagnosis not present

## 2013-04-24 LAB — COMPREHENSIVE METABOLIC PANEL
Albumin: 3.5 g/dL (ref 3.5–5.2)
Alkaline Phosphatase: 71 U/L (ref 39–117)
BUN: 10 mg/dL (ref 6–23)
CO2: 25 mEq/L (ref 19–32)
Chloride: 99 mEq/L (ref 96–112)
Creatinine, Ser: 0.8 mg/dL (ref 0.50–1.10)
GFR calc non Af Amer: >90 mL/min (ref >90)
Glucose, Bld: 240 mg/dL — ABNORMAL HIGH (ref 70–99)
Potassium: 3.9 mEq/L (ref 3.5–5.1)
Total Bilirubin: 0.4 mg/dL (ref 0.3–1.2)

## 2013-04-24 LAB — CBC WITH DIFFERENTIAL/PLATELET
Basophils Absolute: 0 10*3/uL (ref 0.0–0.1)
Eosinophils Absolute: 0.1 10*3/uL (ref 0.0–0.7)
Eosinophils Relative: 2 % (ref 0–5)
Lymphocytes Relative: 19 % (ref 12–46)
MCV: 88.4 fL (ref 78.0–100.0)
Neutrophils Relative %: 69 % (ref 43–77)
Platelets: 323 10*3/uL (ref 150–400)
RDW: 12.4 % (ref 11.5–15.5)
WBC: 8.9 10*3/uL (ref 4.0–10.5)

## 2013-04-24 LAB — URINALYSIS, ROUTINE W REFLEX MICROSCOPIC
Glucose, UA: 500 mg/dL — AB
Hgb urine dipstick: NEGATIVE
Ketones, ur: >80 mg/dL — AB
Nitrite: NEGATIVE
Specific Gravity, Urine: 1.038 — ABNORMAL HIGH (ref 1.005–1.030)
pH: 6 (ref 5.0–8.0)

## 2013-04-24 LAB — GLUCOSE, CAPILLARY: Glucose-Capillary: 226 mg/dL — ABNORMAL HIGH (ref 70–99)

## 2013-04-24 MED ORDER — DEXTROSE 5 % IV SOLN
1.0000 g | INTRAVENOUS | Status: DC
  Administered 2013-04-24: 1 g via INTRAVENOUS
  Filled 2013-04-24: qty 10

## 2013-04-24 MED ORDER — CEPHALEXIN 500 MG PO CAPS: 500.0000 mg | ORAL_CAPSULE | Freq: Four times a day (QID) | ORAL | Status: DC

## 2013-04-24 MED ORDER — SODIUM CHLORIDE 0.9 % IV SOLN
Freq: Once | INTRAVENOUS | Status: AC
  Administered 2013-04-24: 14:00:00 via INTRAVENOUS

## 2013-04-24 MED ORDER — SULFAMETHOXAZOLE-TRIMETHOPRIM 800-160 MG PO TABS: 1.0000 | ORAL_TABLET | Freq: Two times a day (BID) | ORAL | Status: DC

## 2013-04-24 NOTE — ED Notes (Signed)
Pt reports fever, reddened area on LLQ of abdomen from Insulin pump insertion site.  She was seen by endocrinologist today and sent to ED for evaluation, IVF and IV antibiotics.

## 2013-04-24 NOTE — ED Notes (Signed)
Redness, warmth and tenderness noted on LLQ from insulin pump site since yesterday.  Pt reports purulent drainage and fever of 101 yesterday.

## 2013-04-24 NOTE — ED Notes (Signed)
Patient transported to X-ray 

## 2013-04-24 NOTE — ED Provider Notes (Signed)
CSN: [id number]     Arrival date & time 04/24/13  1205 History     First MD Initiated Contact with Patient 04/24/13 1241     Chief Complaint  Patient presents with  . Wound Infection  . Hyperglycemia   (Consider location/radiation/quality/duration/timing/severity/associated sxs/prior Treatment) Patient is a 37 y.o. female presenting with abdominal pain. The history is provided by the patient. No language interpreter was used.  Abdominal Pain Pain location:  LLQ Pain quality: aching   Pain radiates to:  Does not radiate Pain severity:  Moderate Onset quality:  Gradual Duration:  2 days Timing:  Constant Progression:  Worsening Chronicity:  New Relieved by:  Nothing Worsened by:  Nothing tried Ineffective treatments:  None tried Pt complains of a red area on left lower abdomen at site that she had insulin pump needle.  Pt saw Dr.  Horald Pollen and was sent here for evaluation.   Pt reports she is having financial issues and can not get her needles or medications  Past Medical History  Diagnosis Date  . DM (diabetes mellitus)   . Diabetic gastroparesis   . Seizure disorder   . Hypertension   . Umbilical hernia   . Hyperlipidemia   . Thyroid disease   . Gastroparesis     Due to diabetes   . Bipolar 1 disorder    Past Surgical History  Procedure Laterality Date  . Vaginal hysterectomy    . Tonsillectomy     Family History  Problem Relation Age of Onset  . Diabetes Mother   . Heart attack Mother   . Ovarian cancer Paternal Grandmother     great grandmother   History  Substance Use Topics  . Smoking status: Current Every Day Smoker -- 2.00 packs/day    Types: Cigarettes  . Smokeless tobacco: Never Used  . Alcohol Use: No   OB History   Grav Para Term Preterm Abortions TAB SAB Ect Mult Living                 Review of Systems  Gastrointestinal: Positive for abdominal pain.  All other systems reviewed and are negative.    Allergies  Codeine  Home Medications    Current Outpatient Rx  Name  Route  Sig  Dispense  Refill  . minocycline (DYNACIN) 50 MG tablet   Oral   Take 50 mg by mouth 2 (two) times daily.         . AMBULATORY NON FORMULARY MEDICATION      Humalog Pump  Use as directed         . AMBULATORY NON FORMULARY MEDICATION   Oral   Take 10 mg by mouth 3 (three) times daily with meals as needed. Medication Name: Domperidone   240 tablet   11   . divalproex (DEPAKOTE) 500 MG EC tablet   Oral   Take 1,500 mg by mouth at bedtime.           . gabapentin (NEURONTIN) 400 MG tablet   Oral   Take 400 mg by mouth 3 (three) times daily.           Marland Kitchen lamoTRIgine (LAMICTAL) 150 MG tablet   Oral   Take 150 mg by mouth daily.         Marland Kitchen levothyroxine (SYNTHROID, LEVOTHROID) 100 MCG tablet   Oral   Take 100 mcg by mouth daily.         . promethazine (PHENERGAN) 25 MG tablet   Oral  Take 25 mg by mouth every 6 (six) hours as needed for nausea.          BP 118/45  Pulse 79  Temp(Src) 98.4 F (36.9 C) (Oral)  Resp 16  Ht 5\' 1"  (1.549 m)  Wt 134 lb (60.782 kg)  BMI 25.33 kg/m2  SpO2 98% Physical Exam  Constitutional: She appears well-developed and well-nourished.  HENT:  Head: Normocephalic.  Eyes: Pupils are equal, round, and reactive to light.  Neck: Normal range of motion.  Cardiovascular: Normal rate and normal heart sounds.   Pulmonary/Chest: Effort normal.  Abdominal: Soft.  Musculoskeletal: Normal range of motion.  Neurological: She is alert.  Skin: Skin is warm.  Psychiatric: She has a normal mood and affect.    ED Course   Procedures (including critical care time)  Labs Reviewed  URINALYSIS, ROUTINE W REFLEX MICROSCOPIC - Abnormal; Notable for the following:    Color, Urine AMBER (*)    APPearance CLOUDY (*)    Specific Gravity, Urine 1.038 (*)    Glucose, UA 500 (*)    Bilirubin Urine SMALL (*)    Ketones, ur >80 (*)    Protein, ur 30 (*)    All other components within normal limits   COMPREHENSIVE METABOLIC PANEL - Abnormal; Notable for the following:    Glucose, Bld 240 (*)    All other components within normal limits  GLUCOSE, CAPILLARY - Abnormal; Notable for the following:    Glucose-Capillary 226 (*)    All other components within normal limits  URINE MICROSCOPIC-ADD ON - Abnormal; Notable for the following:    Squamous Epithelial / LPF MANY (*)    Bacteria, UA MANY (*)    All other components within normal limits  CBC WITH DIFFERENTIAL   Dg Chest 2 View  04/24/2013   CLINICAL DATA:  Fever.  EXAM: CHEST  2 VIEW  COMPARISON:  10/18/2009.  FINDINGS: The heart remains normal in size and the lungs remain clear and mildly hyperexpanded. Stable mild scoliosis.  IMPRESSION: Stable mild changes of COPD. No acute abnormality.   Electronically Signed   By: Gordan Payment   On: 04/24/2013 13:37   1. Local skin infection     MDM  [facility],   Pt given numbers for to help with medications.  I counseled on [facility].   Pt given rx for keflex.   Pt advised to return here tomorrow for recheck and second dosage of iv antibiotics  Elson Areas, PA-C 04/24/13 1607

## 2013-04-25 ENCOUNTER — Encounter (HOSPITAL_BASED_OUTPATIENT_CLINIC_OR_DEPARTMENT_OTHER): Payer: Self-pay

## 2013-04-25 ENCOUNTER — Emergency Department (HOSPITAL_BASED_OUTPATIENT_CLINIC_OR_DEPARTMENT_OTHER)
Admission: EM | Admit: 2013-04-25 | Discharge: 2013-04-25 | Disposition: A | Discharge status: Home / Self Care | Payer: Medicare Other | Attending: Emergency Medicine | Admitting: Emergency Medicine

## 2013-04-25 DIAGNOSIS — Z794 Long term (current) use of insulin: Secondary | ICD-10-CM | POA: Insufficient documentation

## 2013-04-25 DIAGNOSIS — I10 Essential (primary) hypertension: Secondary | ICD-10-CM | POA: Insufficient documentation

## 2013-04-25 DIAGNOSIS — E079 Disorder of thyroid, unspecified: Secondary | ICD-10-CM | POA: Insufficient documentation

## 2013-04-25 DIAGNOSIS — R21 Rash and other nonspecific skin eruption: Secondary | ICD-10-CM | POA: Insufficient documentation

## 2013-04-25 DIAGNOSIS — F17200 Nicotine dependence, unspecified, uncomplicated: Secondary | ICD-10-CM | POA: Insufficient documentation

## 2013-04-25 DIAGNOSIS — Z79899 Other long term (current) drug therapy: Secondary | ICD-10-CM | POA: Diagnosis not present

## 2013-04-25 DIAGNOSIS — Z862 Personal history of diseases of the blood and blood-forming organs and certain disorders involving the immune mechanism: Secondary | ICD-10-CM | POA: Insufficient documentation

## 2013-04-25 DIAGNOSIS — F319 Bipolar disorder, unspecified: Secondary | ICD-10-CM | POA: Diagnosis not present

## 2013-04-25 DIAGNOSIS — L539 Erythematous condition, unspecified: Secondary | ICD-10-CM | POA: Diagnosis not present

## 2013-04-25 DIAGNOSIS — K3184 Gastroparesis: Secondary | ICD-10-CM | POA: Insufficient documentation

## 2013-04-25 DIAGNOSIS — Z8719 Personal history of other diseases of the digestive system: Secondary | ICD-10-CM | POA: Insufficient documentation

## 2013-04-25 DIAGNOSIS — G40909 Epilepsy, unspecified, not intractable, without status epilepticus: Secondary | ICD-10-CM | POA: Insufficient documentation

## 2013-04-25 DIAGNOSIS — L089 Local infection of the skin and subcutaneous tissue, unspecified: Secondary | ICD-10-CM | POA: Insufficient documentation

## 2013-04-25 DIAGNOSIS — E1149 Type 2 diabetes mellitus with other diabetic neurological complication: Secondary | ICD-10-CM | POA: Diagnosis not present

## 2013-04-25 DIAGNOSIS — Z8639 Personal history of other endocrine, nutritional and metabolic disease: Secondary | ICD-10-CM | POA: Insufficient documentation

## 2013-04-25 LAB — GLUCOSE, CAPILLARY: Glucose-Capillary: 271 mg/dL — ABNORMAL HIGH (ref 70–99)

## 2013-04-25 MED ORDER — DEXTROSE 5 % IV SOLN
1.0000 g | INTRAVENOUS | Status: DC
  Administered 2013-04-25: 1 g via INTRAVENOUS
  Filled 2013-04-25: qty 10

## 2013-04-25 NOTE — ED Provider Notes (Signed)
CSN: [id number]     Arrival date & time 04/25/13  1307 History   First MD Initiated Contact with Patient 04/25/13 1309     Chief Complaint  Patient presents with  . Follow-up   (Consider location/radiation/quality/duration/timing/severity/associated sxs/prior Treatment) Patient is a 37 y.o. female presenting with rash. The history is provided by the patient. No language interpreter was used.  Rash Location: abdomen. Severity:  Moderate Progression:  Improving Pt here for recheck of infection and possible second dosage of antibiotics.   Pt feels better.  Patient reports decreased redness  Past Medical History  Diagnosis Date  . DM (diabetes mellitus)   . Diabetic gastroparesis   . Seizure disorder   . Hypertension   . Umbilical hernia   . Hyperlipidemia   . Thyroid disease   . Gastroparesis     Due to diabetes   . Bipolar 1 disorder    Past Surgical History  Procedure Laterality Date  . Vaginal hysterectomy    . Tonsillectomy     Family History  Problem Relation Age of Onset  . Diabetes Mother   . Heart attack Mother   . Ovarian cancer Paternal Grandmother     great grandmother   History  Substance Use Topics  . Smoking status: Current Every Day Smoker -- 2.00 packs/day    Types: Cigarettes  . Smokeless tobacco: Never Used  . Alcohol Use: No   OB History   Grav Para Term Preterm Abortions TAB SAB Ect Mult Living                 Review of Systems  Skin: Positive for rash.  All other systems reviewed and are negative.    Allergies  Codeine  Home Medications   Current Outpatient Rx  Name  Route  Sig  Dispense  Refill  . AMBULATORY NON FORMULARY MEDICATION      Humalog Pump  Use as directed         . AMBULATORY NON FORMULARY MEDICATION   Oral   Take 10 mg by mouth 3 (three) times daily with meals as needed. Medication Name: Domperidone   240 tablet   11   . cephALEXin (KEFLEX) 500 MG capsule   Oral   Take 1 capsule (500 mg total) by mouth 4  (four) times daily.   28 capsule   0   . divalproex (DEPAKOTE) 500 MG EC tablet   Oral   Take 1,500 mg by mouth at bedtime.           . gabapentin (NEURONTIN) 400 MG tablet   Oral   Take 400 mg by mouth 3 (three) times daily.           Marland Kitchen lamoTRIgine (LAMICTAL) 150 MG tablet   Oral   Take 150 mg by mouth daily.         Marland Kitchen levothyroxine (SYNTHROID, LEVOTHROID) 100 MCG tablet   Oral   Take 100 mcg by mouth daily.         . minocycline (DYNACIN) 50 MG tablet   Oral   Take 50 mg by mouth 2 (two) times daily.         . promethazine (PHENERGAN) 25 MG tablet   Oral   Take 25 mg by mouth every 6 (six) hours as needed for nausea.         Marland Kitchen sulfamethoxazole-trimethoprim (SEPTRA DS) 800-160 MG per tablet   Oral   Take 1 tablet by mouth every 12 (twelve) hours.  14 tablet   0    BP 134/83  Pulse 92  Temp(Src) 98.3 F (36.8 C) (Oral)  Resp 16  SpO2 100% Physical Exam  Nursing note and vitals reviewed. Constitutional: She is oriented to person, place, and time. She appears well-developed and well-nourished.  Neurological: She is alert and oriented to person, place, and time.  Skin: There is erythema.  6cm area of erythema  Psychiatric: She has a normal mood and affect.    ED Course  Procedures (including critical care time) Labs Review Labs Reviewed  GLUCOSE, CAPILLARY - Abnormal; Notable for the following:    Glucose-Capillary 271 (*)    All other components within normal limits   Imaging Review Dg Chest 2 View  04/24/2013   CLINICAL DATA:  Fever.  EXAM: CHEST  2 VIEW  COMPARISON:  10/18/2009.  FINDINGS: The heart remains normal in size and the lungs remain clear and mildly hyperexpanded. Stable mild scoliosis.  IMPRESSION: Stable mild changes of COPD. No acute abnormality.   Electronically Signed   By: Gordan Payment   On: 04/24/2013 13:37    MDM   1. Skin infection    Pt given Iv rocephin.   Pt advised to continue antibiotics orally    Elson Areas, PA-C 04/25/13 1527  Lonia Skinner [place], [place] 04/25/13 1528

## 2013-04-25 NOTE — ED Notes (Signed)
Here for a recheck of abdominal wound.  Seen here yesterday and received IV antibiotics.

## 2013-04-25 NOTE — ED Notes (Signed)
Patient requested CBG check because she was "feeling shaky" CBG 271 at triage

## 2013-04-25 NOTE — ED Notes (Signed)
Redness to LLQ of abdomen improving from 04/24/13 assessment.  Pt denies pain and does not voice other complaints.

## 2013-04-27 NOTE — ED Provider Notes (Signed)
History/physical exam/procedure(s) were performed by non-physician practitioner and as supervising physician I was immediately available for consultation/collaboration. I have reviewed all notes and am in agreement with care and plan.   Kirbi Farrugia S Tiffani Kadow, MD 04/27/13 1409 

## 2013-04-28 NOTE — ED Provider Notes (Signed)
Medical screening examination/treatment/procedure(s) were performed by non-physician practitioner and as supervising physician I was immediately available for consultation/collaboration.    Vida Roller, MD 04/28/13 [phone]

## 2013-05-12 DIAGNOSIS — Z01419 Encounter for gynecological examination (general) (routine) without abnormal findings: Secondary | ICD-10-CM | POA: Diagnosis not present

## 2013-05-12 DIAGNOSIS — N898 Other specified noninflammatory disorders of vagina: Secondary | ICD-10-CM | POA: Diagnosis not present

## 2013-05-12 DIAGNOSIS — N951 Menopausal and female climacteric states: Secondary | ICD-10-CM | POA: Diagnosis not present

## 2013-05-12 DIAGNOSIS — A630 Anogenital (venereal) warts: Secondary | ICD-10-CM | POA: Diagnosis not present

## 2013-05-12 DIAGNOSIS — R109 Unspecified abdominal pain: Secondary | ICD-10-CM | POA: Diagnosis not present

## 2013-05-12 DIAGNOSIS — Z23 Encounter for immunization: Secondary | ICD-10-CM | POA: Diagnosis not present

## 2013-05-25 DIAGNOSIS — R1032 Left lower quadrant pain: Secondary | ICD-10-CM | POA: Diagnosis not present

## 2013-06-14 ENCOUNTER — Telehealth: Payer: Self-pay | Admitting: Internal Medicine

## 2013-06-14 NOTE — Telephone Encounter (Signed)
lmom for pt to call back. Pt with hx of Gastroparesis, on Domperidone TID AC.

## 2013-06-19 NOTE — Telephone Encounter (Signed)
Pt called back today. She c/o changes in her bowels; 4 a day that change in consistency. She reports Dr Clent Ridges dx [place] after a CT years ago with Crohn's. She wears an insulin pump and has gastroparesis.   Pt given an appt with Doug Sou, PA tomorrow.

## 2013-06-20 ENCOUNTER — Encounter: Payer: Self-pay | Admitting: Gastroenterology

## 2013-06-20 ENCOUNTER — Other Ambulatory Visit (INDEPENDENT_AMBULATORY_CARE_PROVIDER_SITE_OTHER): Payer: Medicare Other

## 2013-06-20 ENCOUNTER — Ambulatory Visit (INDEPENDENT_AMBULATORY_CARE_PROVIDER_SITE_OTHER): Payer: Medicare Other | Admitting: Gastroenterology

## 2013-06-20 VITALS — BP 118/70 | HR 88 | Ht 61.0 in | Wt 130.0 lb

## 2013-06-20 DIAGNOSIS — R6889 Other general symptoms and signs: Secondary | ICD-10-CM

## 2013-06-20 DIAGNOSIS — R109 Unspecified abdominal pain: Secondary | ICD-10-CM

## 2013-06-20 DIAGNOSIS — K3184 Gastroparesis: Secondary | ICD-10-CM

## 2013-06-20 DIAGNOSIS — R194 Change in bowel habit: Secondary | ICD-10-CM

## 2013-06-20 DIAGNOSIS — R198 Other specified symptoms and signs involving the digestive system and abdomen: Secondary | ICD-10-CM

## 2013-06-20 DIAGNOSIS — R112 Nausea with vomiting, unspecified: Secondary | ICD-10-CM | POA: Diagnosis not present

## 2013-06-20 LAB — TSH: TSH: 3.04 u[IU]/mL (ref 0.35–5.50)

## 2013-06-20 LAB — CBC WITH DIFFERENTIAL/PLATELET
Basophils Relative: 1.5 % (ref 0.0–3.0)
Eosinophils Relative: 2 % (ref 0.0–5.0)
HCT: 40.8 % (ref 36.0–46.0)
Hemoglobin: 13.9 g/dL (ref 12.0–15.0)
Lymphs Abs: 2 10*3/uL (ref 0.7–4.0)
Monocytes Relative: 10 % (ref 3.0–12.0)
Platelets: 316 10*3/uL (ref 150.0–400.0)
RBC: 4.62 Mil/uL (ref 3.87–5.11)
WBC: 6.8 10*3/uL (ref 4.5–10.5)

## 2013-06-20 LAB — COMPREHENSIVE METABOLIC PANEL
Albumin: 3.8 g/dL (ref 3.5–5.2)
BUN: 9 mg/dL (ref 6–23)
CO2: 26 mEq/L (ref 19–32)
GFR: 78.39 mL/min (ref >60.00)
Glucose, Bld: 250 mg/dL — ABNORMAL HIGH (ref 70–99)
Sodium: 134 mEq/L — ABNORMAL LOW (ref 135–145)
Total Bilirubin: 0.8 mg/dL (ref 0.3–1.2)
Total Protein: 7 g/dL (ref 6.0–8.3)

## 2013-06-20 LAB — URINALYSIS, ROUTINE W REFLEX MICROSCOPIC
Hgb urine dipstick: NEGATIVE
Total Protein, Urine: 30
Urine Glucose: NEGATIVE

## 2013-06-20 LAB — C-REACTIVE PROTEIN: CRP: <0.5 mg/dL (ref 0.5–20.0)

## 2013-06-20 MED ORDER — PROMETHAZINE HCL 25 MG PO TABS: 25.0000 mg | ORAL_TABLET | Freq: Four times a day (QID) | ORAL | Status: DC | PRN

## 2013-06-20 NOTE — Patient Instructions (Addendum)
Please go to the basement level to have your labs drawn.  Take Domperidone, 1 tab 4 times daily for now. We sent a prescription refill for the Phenergan ( Promethazine ) to your pharmacy for nausea. We made an appointment with Dr. Rhea Belton on 07-21-2013 at 2:00 PM.    You have been scheduled for a CT scan of the abdomen and pelvis at [facility] ([address]---this is in the same building as [facility]ural [facility]).   You are scheduled on 06-21-2013 at 10:30 am . You should arrive at 10:15 am.  prior to your appointment time for registration. Please follow the written instructions below on the day of your exam:  WARNING: IF YOU ARE ALLERGIC TO IODINE/X-RAY DYE, PLEASE NOTIFY RADIOLOGY IMMEDIATELY AT [phone]! YOU WILL BE GIVEN A 13 HOUR PREMEDICATION PREP.  1) Do not eat or drink anything after 6:30 am  (4 hours prior to your test) 2) You have been given 2 bottles of oral contrast to drink. The solution may taste  better if refrigerated, but do NOT add ice or any other liquid to this solution. Shake  well before drinking.    Drink 1 bottle of contrast @ 8:30 am  (2 hours prior to your exam)  Drink 1 bottle of contrast @ 9:30 am  (1 hour prior to your exam)  You may take any medications as prescribed with a small amount of water except for the following: Metformin, Glucophage, Glucovance, Avandamet, Riomet, Fortamet, Actoplus Met, Janumet, Glumetza or Metaglip. The above medications must be held the day of the exam AND 48 hours after the exam.  The purpose of you drinking the oral contrast is to aid in the visualization of your intestinal tract. The contrast solution may cause some diarrhea. Before your exam is started, you will be given a small amount of fluid to drink. Depending on your individual set of symptoms, you may also receive an intravenous injection of x-ray contrast/dye. Plan on being at [facility] for 30 minutes or long, depending on the type of exam you are  having performed.  If you have any questions regarding your exam or if you need to reschedule, you may call the CT department at [phone] between the hours of 8:00 am and 5:00 pm, Monday-Friday.  ________________________________________________________________________

## 2013-06-21 ENCOUNTER — Other Ambulatory Visit: Payer: Medicare Other

## 2013-06-21 DIAGNOSIS — K3184 Gastroparesis: Secondary | ICD-10-CM | POA: Insufficient documentation

## 2013-06-21 DIAGNOSIS — R112 Nausea with vomiting, unspecified: Secondary | ICD-10-CM | POA: Insufficient documentation

## 2013-06-21 DIAGNOSIS — R194 Change in bowel habit: Secondary | ICD-10-CM

## 2013-06-21 DIAGNOSIS — R6889 Other general symptoms and signs: Secondary | ICD-10-CM | POA: Insufficient documentation

## 2013-06-21 DIAGNOSIS — R109 Unspecified abdominal pain: Secondary | ICD-10-CM | POA: Insufficient documentation

## 2013-06-21 HISTORY — DX: Change in bowel habit: R19.4

## 2013-06-21 HISTORY — DX: Nausea with vomiting, unspecified: R11.2

## 2013-06-21 NOTE — Progress Notes (Addendum)
06/21/2013 KENYETTA Hayden [id number] 1976/06/17   History of Present Illness:  Karen Hayden is a pleasant 37 year old female who is a patient of Dr. Lauro Franklin. She has suspected to diabetic gastroparesis after having long-standing insulin-dependent diabetes. She has been taking domperidone 10 mg 3 times daily with meals for the last several months and had been doing excellent with that. When she was last seen she was not using any anti-emetics. She presents to the office today stating that her gastroparesis symptoms are returning again. Says that she cannot seem to keep any food on her stomach. This is all been worsening approximately the last month or two. Now she is using Phenergan 25 mg 4-5 times daily. She says that her blood sugars have been under good control, however, her levels are still been documented in the 200 range. This is improved from previous 400 blood sugars, however.  **EGD in 04/2011 was normal.  She also complains of left lower quadrant abdominal pain that goes to the right side of her lower abdomen and around to her back as well. This began in September and has been present since that time. She denies fevers or blood in the stool.  She also states that her bowels have changed recently as well with alternating constipation and diarrhea. Yesterday she said her stools were very hard but this morning she woke up and had diarrhea. She says that she had a CAT scan back in 2003 and there had been some mention of Crohn's disease, however, she never heard anything else about that. When she started with these pains and the changes in bowels this began to concern her. I did pull up  CAT scan report and it did state that there was some mildly thickened loops of small bowel in the left upper quadrant.   Current Medications, Allergies, Past Medical History, Past Surgical History, Family History and Social History were reviewed in [employer] record.   Physical Exam: BP 118/70   Pulse 88  Ht 5\' 1"  (1.549 m)  Wt 130 lb (58.968 kg)  BMI 24.58 kg/m2 General: Well developed white female in no acute distress; appears slightly anxious.  Has several tattoos. Head: Normocephalic and atraumatic Eyes:  Sclerae anicteric, conjunctiva pink  Ears: Normal auditory acuity Lungs: Clear throughout to auscultation Heart: Regular rate and rhythm Abdomen: Soft, non-distended.  BS present.  Mild diffuse TTP > in the LLQ.   Musculoskeletal: Symmetrical with no gross deformities  Extremities: No edema  Neurological: Alert oriented x 4, grossly nonfocal Psychological:  Alert and cooperative. Normal mood and affect  Assessment and Recommendations: -Diabetic gastroparesis previously well controlled on domperidone 10 mg 3 times daily with meals. Now with worsening symptoms again.  ? Cause for the abrupt change in symptoms. -Lower abdominal pains greatest in the left lower quadrant with associated change in bowels alternating from hard stools to diarrhea. Some question of thickened small bowel loops on a CAT scan back in 2003.  *We will increase her domperidone to 10 mg 4 times daily. That is with meals and bedtime.  *We will refill her Phenergan to be used up to 4 times daily as needed. *We will check labs including a CBC, CMP, TSH, ESR, CRP, lipase. *We will also check a urinalysis with reflex culture as urinary tract infection could explain her abrupt worsening of gastroparesis symptoms as well as her lower abdominal pains. *We'll check CT scan of the abdomen and pelvis with contrast. *She is to continue with good blood sugar  control. *We'll give her gastroparesis dietary measures and lifestyle modifications. *She will follow up with Dr. Rhea Belton in approximately 4 weeks.  Addendum: Reviewed and agree with management. Beverley Fiedler, MD

## 2013-06-22 ENCOUNTER — Other Ambulatory Visit: Payer: Self-pay | Admitting: *Deleted

## 2013-06-22 MED ORDER — FLUCONAZOLE 150 MG PO TABS: 150.0000 mg | ORAL_TABLET | Freq: Once | ORAL | Status: DC

## 2013-06-23 ENCOUNTER — Other Ambulatory Visit: Payer: Medicare Other

## 2013-06-29 ENCOUNTER — Encounter (INDEPENDENT_AMBULATORY_CARE_PROVIDER_SITE_OTHER): Payer: Self-pay

## 2013-06-29 ENCOUNTER — Ambulatory Visit (INDEPENDENT_AMBULATORY_CARE_PROVIDER_SITE_OTHER)
Admission: RE | Admit: 2013-06-29 | Discharge: 2013-06-29 | Disposition: A | Discharge status: Home / Self Care | Payer: Medicare Other | Source: Ambulatory Visit | Attending: Gastroenterology | Admitting: Gastroenterology

## 2013-06-29 DIAGNOSIS — R112 Nausea with vomiting, unspecified: Secondary | ICD-10-CM

## 2013-06-29 DIAGNOSIS — K3184 Gastroparesis: Secondary | ICD-10-CM | POA: Diagnosis not present

## 2013-06-29 DIAGNOSIS — R109 Unspecified abdominal pain: Secondary | ICD-10-CM

## 2013-06-29 MED ORDER — IOHEXOL 300 MG/ML  SOLN
100.0000 mL | Freq: Once | INTRAMUSCULAR | Status: AC | PRN
  Administered 2013-06-29: 100 mL via INTRAVENOUS

## 2013-07-20 ENCOUNTER — Encounter: Payer: Self-pay | Admitting: Internal Medicine

## 2013-07-21 ENCOUNTER — Ambulatory Visit (INDEPENDENT_AMBULATORY_CARE_PROVIDER_SITE_OTHER): Payer: Medicare Other | Admitting: Internal Medicine

## 2013-07-21 ENCOUNTER — Encounter: Payer: Self-pay | Admitting: Internal Medicine

## 2013-07-21 VITALS — BP 96/50 | HR 96 | Ht 62.6 in | Wt 131.2 lb

## 2013-07-21 DIAGNOSIS — K3184 Gastroparesis: Secondary | ICD-10-CM

## 2013-07-21 DIAGNOSIS — R6889 Other general symptoms and signs: Secondary | ICD-10-CM | POA: Diagnosis not present

## 2013-07-21 DIAGNOSIS — R109 Unspecified abdominal pain: Secondary | ICD-10-CM | POA: Diagnosis not present

## 2013-07-21 DIAGNOSIS — R110 Nausea: Secondary | ICD-10-CM

## 2013-07-21 MED ORDER — SULFAMETHOXAZOLE-TMP DS 800-160 MG PO TABS: 1.0000 | ORAL_TABLET | Freq: Two times a day (BID) | ORAL | Status: DC

## 2013-07-21 MED ORDER — SACCHAROMYCES BOULARDII 250 MG PO CAPS: 250.0000 mg | ORAL_CAPSULE | Freq: Two times a day (BID) | ORAL | Status: DC

## 2013-07-21 MED ORDER — PROCHLORPERAZINE MALEATE 5 MG PO TABS: 5.0000 mg | ORAL_TABLET | Freq: Four times a day (QID) | ORAL | Status: DC | PRN

## 2013-07-21 NOTE — Progress Notes (Signed)
Subjective:    Patient ID: Karen Hayden, female    DOB: Jan 06, 1976, 37 y.o.   MRN: [id number]  HPI Dayana is a 37 year old female with a past medical history of type 1 diabetes with insulin pump, hypertension, hyperlipidemia, seizure disorder and gastroparesis who is seen for followup. She was seen in late October by Doug Sou, PA-C to evaluate nausea and vomiting and left lower quadrant abdominal pain. Labs were performed as well as an abdominal CT scan with contrast which did not show overt pathology. She returns today for followup, and she reports she continues to struggle with left lower abdominal pain and nausea with vomiting. Over the last 2 weeks the nausea and vomiting have been daily. Her emesis is nonbloody and nonbilious. Her left lower abdominal pain is sharp but does seem to come and go a bit. It feels to be somewhat better after a bowel movement. Her bowel habits have been somewhat erratic recently varying from hard to soft. She does skip days without bowel movement and other days seems to have more diarrhea. She denies blood in her stool or melena.  She very clearly states that her abdominal pain on the left lower side started when she had an infection at the site of her insulin pump. She has lost her insurance and has had to be more sparing with her supplies. She reports leaving her insulin pump in place one day longer than usual and she developed a subcutaneous infection over this area. She recalls ten-day treatment with Bactrim and cephalexin.  She has avoided using this spot for her insulin pump. The current pain is over this spot at the site of previous infection and feels very similar. She denies fevers or chills. Glucoses have been okay around 200 but perhaps a bit higher recently.    With losing her insurance she has had to pick and choose on medications and she is buying them out of pocket. They're quite expensive for her. She notes one vial of insulin is $200 and she uses 2 vials a  month. She is taking the remainder of her medicines as prescribed but could not afford promethazine recently. She is still taking domperidone 10 mg 3 times daily which she states helps significantly.  She has recently seen her gynecologist in September and had a vaginal ultrasound to examine her remaining left ovary (after hysterectomy and right oophorectomy) and she was told this was normal.  Also of note she's had stress at home. She is raising her 16-month-old grandson after his father left hand. She is also raising her 52 and 52 year old daughters.  The loss of insurance and financial strain have not helped. She has applied and been approved for health insurance through the [facility] but it is not started until January 1.  Review of Systems As per history of present illness, otherwise negative  Current Medications, Allergies, Past Medical History, Past Surgical History, Family History and Social History were reviewed in [employer] record.     Objective:   Physical Exam BP 96/50  Pulse 96  Ht 5' 2.6" (1.59 m)  Wt 131 lb 4 oz (59.535 kg)  BMI 23.55 kg/m2 Constitutional: Well-developed and well-nourished. No distress. HEENT: Normocephalic and atraumatic. Oropharynx is clear and moist. No oropharyngeal exudate. Conjunctivae are normal.  No scleral icterus. Neck: Neck supple. Trachea midline. Cardiovascular: Normal rate, regular rhythm and intact distal pulses. No M/R/G Pulmonary/chest: Effort normal and breath sounds normal. No wheezing, rales or rhonchi. Abdominal: Soft, she has  superficial tenderness over the left lower quadrant with mild subcutaneous firmness without discrete abscess, there is no skin erythema or evidence of cellulitis in this location, nondistended. Bowel sounds active throughout. There are no masses palpable Extremities: no clubbing, cyanosis, or edema Neurological: Alert and oriented to person place and time. Skin: Skin is warm and dry. No rashes  noted. Multiple tattoos Psychiatric: Normal mood and affect. Behavior is normal.  CBC    Component Value Date/Time   WBC 6.8 06/20/2013 1522   RBC 4.62 06/20/2013 1522   HGB 13.9 06/20/2013 1522   HCT 40.8 06/20/2013 1522   PLT 316.0 06/20/2013 1522   MCV 88.1 06/20/2013 1522   MCH 30.4 04/24/2013 1250   MCHC 34.0 06/20/2013 1522   RDW 13.2 06/20/2013 1522   LYMPHSABS 2.0 06/20/2013 1522   MONOABS 0.7 06/20/2013 1522   EOSABS 0.1 06/20/2013 1522   BASOSABS 0.1 06/20/2013 1522    CMP     Component Value Date/Time   NA 134* 06/20/2013 1522   K 4.4 06/20/2013 1522   CL 99 06/20/2013 1522   CO2 26 06/20/2013 1522   GLUCOSE 250* 06/20/2013 1522   BUN 9 06/20/2013 1522   CREATININE 0.9 06/20/2013 1522   CALCIUM 9.5 06/20/2013 1522   PROT 7.0 06/20/2013 1522   ALBUMIN 3.8 06/20/2013 1522   AST 18 06/20/2013 1522   ALT 13 06/20/2013 1522   ALKPHOS 55 06/20/2013 1522   BILITOT 0.8 06/20/2013 1522   GFRNONAA >90 04/24/2013 1250   GFRAA >90 04/24/2013 1250    CT ABDOMEN AND PELVIS WITH CONTRAST -- 06/30/2013   TECHNIQUE: Multidetector CT imaging of the abdomen and pelvis was performed using the standard protocol following bolus administration of intravenous contrast. Oral contrast was also administered.   CONTRAST:  OMNIPAQUE IOHEXOL 300 MG/ML  SOLN   COMPARISON:  December 24, 2003   FINDINGS: Lung bases are clear.   The liver is enlarged, measuring 18 cm in length. No focal liver lesions are identified. There is no biliary duct dilatation.   Spleen, pancreas, and adrenals appear normal. The kidneys bilaterally show no appreciable mass or hydronephrosis. There is no apparent ureteral calculus or ureterectasis on either side.   In the pelvis, there is a 2.1 x 2.2 cm dominant follicle in the right ovary. There is no other pelvic mass. There is no pelvic fluid collection. There is no periappendiceal region inflammation.   There is no bowel obstruction. No free air  or portal venous air.   There is no ascites, adenopathy, or abscess in the abdomen or pelvis. Aorta is nonaneurysmal. There are no blastic or lytic bone lesions.   IMPRESSION: No inflammatory focus or bowel obstruction. No abscess.   Liver enlarged but otherwise normal in appearance.   Dominant follicle right ovary.      Assessment & Plan:  37 year old female with a past medical history of type 1 diabetes with insulin pump, hypertension, hyperlipidemia, seizure disorder and gastroparesis who is seen for followup  1.  LLQ abd pain -- given her recent skin/soft tissue infection arising from the site of her insulin pump, I'm concerned about residual subcutaneous/soft tissue infection. This area is tender to light palpation and reproduces the pain she is describing. The CT scan did not show any evidence of bowel inflammation such as diverticulitis. Her bowel habit changes may be secondary to recent antibiotics.  I would like to treat her with another course of antibiotics for soft tissue infection. I will prescribe  Bactrim DS 1 tablet twice a day x10 days. I also want her to take probiotic, Florastor 250 mg BID, while on the antibiotic. Hopefully this will help regulate her bowel movements.  If she cannot afford the probiotic she can take the samples we have given her once daily to make them last longer.  If this treatment fails to improve her symptoms, we will consider an ultrasound for the soft tissue and possibly colonoscopy to ensure there is no colonic pathology.  2.  N/V -- known gastroparesis, perhaps worsened by ongoing soft tissue infection. She will continue domperidone 10 mg 3 times daily. She has had an EKG in the last year with no evidence of QT prolongation.  She is happy with this medication and wants to continue it. She previously responded to Compazine and thus I will give her a prescription for Compazine 5-10 mg every 6-8 hours as needed for nausea. I will discontinue promethazine  and she knows not to use promethazine and Compazine together. She will continue gastroparesis diet  I'll see her back in 2-3 weeks to ensure she is improving. If she improves dramatically the appointment can be canceled to help save her money. However, I've let her know that I want her to call me with an update, either way, in 2-3 weeks.  She voices understanding.

## 2013-07-21 NOTE — Patient Instructions (Addendum)
You have been given a separate informational sheet regarding your tobacco use, the importance of quitting and local resources to help you quit.  We have sent the following medications to your pharmacy for you to pick up at your convenience: You have been given samples of florastor; take 1 capsule twice daily.  Compozine and bactrim  Please take Florastor while taking bactrim  Call us in 2 weeks for an update of symptoms  You have a follow up appointment with Dr. Rhea Belton in office on December 9 at 2:45

## 2013-08-07 ENCOUNTER — Encounter: Payer: Self-pay | Admitting: Internal Medicine

## 2013-08-08 ENCOUNTER — Ambulatory Visit: Payer: Medicare Other | Admitting: Internal Medicine

## 2013-08-11 ENCOUNTER — Encounter (HOSPITAL_BASED_OUTPATIENT_CLINIC_OR_DEPARTMENT_OTHER): Payer: Self-pay | Admitting: Emergency Medicine

## 2013-08-11 ENCOUNTER — Emergency Department (HOSPITAL_BASED_OUTPATIENT_CLINIC_OR_DEPARTMENT_OTHER)
Admission: EM | Admit: 2013-08-11 | Discharge: 2013-08-11 | Disposition: A | Discharge status: Home / Self Care | Payer: Medicare Other | Attending: Emergency Medicine | Admitting: Emergency Medicine

## 2013-08-11 DIAGNOSIS — S61209A Unspecified open wound of unspecified finger without damage to nail, initial encounter: Secondary | ICD-10-CM | POA: Diagnosis not present

## 2013-08-11 DIAGNOSIS — I10 Essential (primary) hypertension: Secondary | ICD-10-CM | POA: Insufficient documentation

## 2013-08-11 DIAGNOSIS — Z79899 Other long term (current) drug therapy: Secondary | ICD-10-CM | POA: Insufficient documentation

## 2013-08-11 DIAGNOSIS — Z792 Long term (current) use of antibiotics: Secondary | ICD-10-CM | POA: Diagnosis not present

## 2013-08-11 DIAGNOSIS — E079 Disorder of thyroid, unspecified: Secondary | ICD-10-CM | POA: Diagnosis not present

## 2013-08-11 DIAGNOSIS — F319 Bipolar disorder, unspecified: Secondary | ICD-10-CM | POA: Diagnosis not present

## 2013-08-11 DIAGNOSIS — E1149 Type 2 diabetes mellitus with other diabetic neurological complication: Secondary | ICD-10-CM | POA: Diagnosis not present

## 2013-08-11 DIAGNOSIS — F17200 Nicotine dependence, unspecified, uncomplicated: Secondary | ICD-10-CM | POA: Diagnosis not present

## 2013-08-11 DIAGNOSIS — Z23 Encounter for immunization: Secondary | ICD-10-CM | POA: Diagnosis not present

## 2013-08-11 DIAGNOSIS — Z794 Long term (current) use of insulin: Secondary | ICD-10-CM | POA: Insufficient documentation

## 2013-08-11 DIAGNOSIS — Y929 Unspecified place or not applicable: Secondary | ICD-10-CM | POA: Insufficient documentation

## 2013-08-11 DIAGNOSIS — K3184 Gastroparesis: Secondary | ICD-10-CM | POA: Diagnosis not present

## 2013-08-11 DIAGNOSIS — G40909 Epilepsy, unspecified, not intractable, without status epilepticus: Secondary | ICD-10-CM | POA: Diagnosis not present

## 2013-08-11 DIAGNOSIS — Y9389 Activity, other specified: Secondary | ICD-10-CM | POA: Insufficient documentation

## 2013-08-11 DIAGNOSIS — S61219A Laceration without foreign body of unspecified finger without damage to nail, initial encounter: Secondary | ICD-10-CM

## 2013-08-11 DIAGNOSIS — W268XXA Contact with other sharp object(s), not elsewhere classified, initial encounter: Secondary | ICD-10-CM | POA: Insufficient documentation

## 2013-08-11 MED ORDER — TETANUS-DIPHTH-ACELL PERTUSSIS 5-2.5-18.5 LF-MCG/0.5 IM SUSP
0.5000 mL | Freq: Once | INTRAMUSCULAR | Status: AC
  Administered 2013-08-11: 0.5 mL via INTRAMUSCULAR
  Filled 2013-08-11: qty 0.5

## 2013-08-11 NOTE — ED Notes (Signed)
Pt c/o laceration to right index finger by broken glass while washing dishes.

## 2013-08-11 NOTE — ED Provider Notes (Signed)
CSN: [id number]     Arrival date & time 08/11/13  1703 History   First MD Initiated Contact with Patient 08/11/13 1713     Chief Complaint  Patient presents with  . Extremity Laceration   (Consider location/radiation/quality/duration/timing/severity/associated sxs/prior Treatment) HPI Comments: Patient presents today with a laceration of her right index finger that occurred just prior to arrival.  She reports that she was putting away dishes when one of the glasses broke.  She was cut in the finger by a large piece of glass.  She states that the glass did not shatter.  She has full ROM of her finger.  Denies numbness or tingling.  She has applied pressure to the laceration, which has controlled the bleeding.  She reports that her last tetanus shot was approximately ten years ago.   The history is provided by the patient.    Past Medical History  Diagnosis Date  . DM (diabetes mellitus)   . Diabetic gastroparesis   . Seizure disorder   . Hypertension   . Umbilical hernia   . Hyperlipidemia   . Thyroid disease   . Gastroparesis     Due to diabetes   . Bipolar 1 disorder    Past Surgical History  Procedure Laterality Date  . Vaginal hysterectomy    . Tonsillectomy     Family History  Problem Relation Age of Onset  . Diabetes Mother   . Heart attack Mother   . Ovarian cancer Paternal Grandmother     great grandmother   History  Substance Use Topics  . Smoking status: Current Every Day Smoker -- 2.00 packs/day    Types: Cigarettes  . Smokeless tobacco: Never Used  . Alcohol Use: No   OB History   Grav Para Term Preterm Abortions TAB SAB Ect Mult Living                 Review of Systems  Skin: Positive for wound.  All other systems reviewed and are negative.    Allergies  Codeine  Home Medications   Current Outpatient Rx  Name  Route  Sig  Dispense  Refill  . insulin lispro (HUMALOG) 100 UNIT/ML injection   Subcutaneous   Inject into the skin 3 (three)  times daily before meals. Pump         . AMBULATORY NON FORMULARY MEDICATION      Humalog Pump  Use as directed         . AMBULATORY NON FORMULARY MEDICATION   Oral   Take 10 mg by mouth 3 (three) times daily with meals as needed. Medication Name: Domperidone   240 tablet   11   . cephALEXin (KEFLEX) 500 MG capsule   Oral   Take 1 capsule (500 mg total) by mouth 4 (four) times daily.   28 capsule   0   . divalproex (DEPAKOTE) 500 MG EC tablet   Oral   Take 1,500 mg by mouth at bedtime.           . fluconazole (DIFLUCAN) 150 MG tablet   Oral   Take 1 tablet (150 mg total) by mouth once.   1 tablet   0   . gabapentin (NEURONTIN) 400 MG tablet   Oral   Take 400 mg by mouth 3 (three) times daily.           Marland Kitchen lamoTRIgine (LAMICTAL) 150 MG tablet   Oral   Take 150 mg by mouth daily.         Marland Kitchen  levothyroxine (SYNTHROID, LEVOTHROID) 100 MCG tablet   Oral   Take 100 mcg by mouth daily.         . prochlorperazine (COMPAZINE) 5 MG tablet   Oral   Take 1 tablet (5 mg total) by mouth every 6 (six) hours as needed for nausea or vomiting (5-10 mg every 6 hours as needed for nausea).   60 tablet   0   . saccharomyces boulardii (FLORASTOR) 250 MG capsule   Oral   Take 1 capsule (250 mg total) by mouth 2 (two) times daily.   30 capsule   6   . sulfamethoxazole-trimethoprim (BACTRIM DS) 800-160 MG per tablet   Oral   Take 1 tablet by mouth 2 (two) times daily. For 10 days   200 tablet   0    BP 138/84  Pulse 91  Temp(Src) 98.7 F (37.1 C)  Resp 16  Wt 131 lb (59.421 kg)  SpO2 100% Physical Exam  Nursing note and vitals reviewed. Constitutional: She appears well-developed and well-nourished.  HENT:  Head: Normocephalic and atraumatic.  Cardiovascular: Normal rate, regular rhythm and normal heart sounds.   Pulmonary/Chest: Effort normal and breath sounds normal.  Musculoskeletal:  Full ROM of the right index finger  Neurological: She is alert.   Distal sensation of the right index finger intact  Skin: Skin is warm and dry.  2 separate superficial 1 cm linear lacerations of the right index finger just proximal to the MCP.  Good capillary refill of the right index finger    ED Course  Procedures (including critical care time) Labs Review Labs Reviewed - No data to display Imaging Review No results found.  EKG Interpretation   None      LACERATION REPAIR Performed by: Santiago Glad Authorized by: Santiago Glad Consent: Verbal consent obtained. Risks and benefits: risks, benefits and alternatives were discussed Consent given by: patient Patient identity confirmed: provided demographic data Prepped and Draped in normal sterile fashion Wound explored  Laceration Location: right index finger  Laceration Length: two 1 cm lacerations  No Foreign Bodies seen or palpated  Irrigation method: syringe Amount of cleaning: standard  Skin closure: Dermabond  Technique: Dermabond  Patient tolerance: Patient tolerated the procedure well with no immediate complications.   MDM  No diagnosis found. Patient presenting with a laceration to her right index finger.  Laceration superficial.  Unable to see the bottom of the laceration and no foreign bodies visualized.  Patient with full ROM of the finger.  No apparent tendon involvement.  Patient neurovascularly intact.  Laceration repaired with Dermabond.  Tetanus updated.  Patient stable for discharge.    Santiago Glad, PA-C 08/14/13 [phone]

## 2013-08-18 NOTE — ED Provider Notes (Signed)
Medical screening examination/treatment/procedure(s) were performed by non-physician practitioner and as supervising physician I was immediately available for consultation/collaboration.  EKG Interpretation   None         Gwyneth Sprout, MD 08/18/13 [phone]

## 2013-08-22 ENCOUNTER — Encounter (HOSPITAL_BASED_OUTPATIENT_CLINIC_OR_DEPARTMENT_OTHER): Payer: Self-pay | Admitting: Emergency Medicine

## 2013-08-22 ENCOUNTER — Emergency Department (HOSPITAL_BASED_OUTPATIENT_CLINIC_OR_DEPARTMENT_OTHER)
Admission: EM | Admit: 2013-08-22 | Discharge: 2013-08-22 | Disposition: A | Discharge status: Home / Self Care | Payer: Medicare Other | Attending: Emergency Medicine | Admitting: Emergency Medicine

## 2013-08-22 DIAGNOSIS — F17200 Nicotine dependence, unspecified, uncomplicated: Secondary | ICD-10-CM | POA: Diagnosis not present

## 2013-08-22 DIAGNOSIS — E1149 Type 2 diabetes mellitus with other diabetic neurological complication: Secondary | ICD-10-CM | POA: Diagnosis not present

## 2013-08-22 DIAGNOSIS — G40909 Epilepsy, unspecified, not intractable, without status epilepticus: Secondary | ICD-10-CM | POA: Diagnosis not present

## 2013-08-22 DIAGNOSIS — F319 Bipolar disorder, unspecified: Secondary | ICD-10-CM | POA: Diagnosis not present

## 2013-08-22 DIAGNOSIS — E079 Disorder of thyroid, unspecified: Secondary | ICD-10-CM | POA: Insufficient documentation

## 2013-08-22 DIAGNOSIS — I10 Essential (primary) hypertension: Secondary | ICD-10-CM | POA: Insufficient documentation

## 2013-08-22 DIAGNOSIS — Z792 Long term (current) use of antibiotics: Secondary | ICD-10-CM | POA: Diagnosis not present

## 2013-08-22 DIAGNOSIS — Z79899 Other long term (current) drug therapy: Secondary | ICD-10-CM | POA: Insufficient documentation

## 2013-08-22 DIAGNOSIS — Z791 Long term (current) use of non-steroidal anti-inflammatories (NSAID): Secondary | ICD-10-CM | POA: Diagnosis not present

## 2013-08-22 DIAGNOSIS — H9209 Otalgia, unspecified ear: Secondary | ICD-10-CM | POA: Insufficient documentation

## 2013-08-22 DIAGNOSIS — J3489 Other specified disorders of nose and nasal sinuses: Secondary | ICD-10-CM | POA: Diagnosis not present

## 2013-08-22 DIAGNOSIS — K3184 Gastroparesis: Secondary | ICD-10-CM | POA: Insufficient documentation

## 2013-08-22 DIAGNOSIS — Z794 Long term (current) use of insulin: Secondary | ICD-10-CM | POA: Diagnosis not present

## 2013-08-22 DIAGNOSIS — H9202 Otalgia, left ear: Secondary | ICD-10-CM

## 2013-08-22 MED ORDER — NEOMYCIN-POLYMYXIN-HC 3.5-10000-1 OT SUSP: 3.0000 [drp] | Freq: Four times a day (QID) | OTIC | Status: DC

## 2013-08-22 MED ORDER — MELOXICAM 7.5 MG PO TABS: 7.5000 mg | ORAL_TABLET | Freq: Every day | ORAL | Status: DC

## 2013-08-22 NOTE — ED Notes (Signed)
Left ear pain since earlier tonight

## 2013-08-22 NOTE — ED Provider Notes (Signed)
CSN: [id number]     Arrival date & time 08/22/13  0248 History   First MD Initiated Contact with Patient 08/22/13 0257     Chief Complaint  Patient presents with  . Otalgia   (Consider location/radiation/quality/duration/timing/severity/associated sxs/prior Treatment) Patient is a 37 y.o. female presenting with ear pain. The history is provided by the patient.  Otalgia Location:  Left Behind ear:  No abnormality Quality:  Aching Severity:  Moderate Onset quality:  Gradual Duration:  5 hours Timing:  Constant Progression:  Unchanged Chronicity:  New Context: not direct blow, not elevation change, not foreign body in ear and not loud noise   Relieved by:  Nothing Worsened by:  Nothing tried Ineffective treatments:  None tried Associated symptoms: congestion   Associated symptoms: no abdominal pain, no cough, no diarrhea, no ear discharge, no fever, no tinnitus and no vomiting   Congestion:    Location:  Nasal Risk factors: no chronic ear infection     Past Medical History  Diagnosis Date  . DM (diabetes mellitus)   . Diabetic gastroparesis   . Seizure disorder   . Hypertension   . Umbilical hernia   . Hyperlipidemia   . Thyroid disease   . Gastroparesis     Due to diabetes   . Bipolar 1 disorder    Past Surgical History  Procedure Laterality Date  . Vaginal hysterectomy    . Tonsillectomy     Family History  Problem Relation Age of Onset  . Diabetes Mother   . Heart attack Mother   . Ovarian cancer Paternal Grandmother     great grandmother   History  Substance Use Topics  . Smoking status: Current Every Day Smoker -- 2.00 packs/day    Types: Cigarettes  . Smokeless tobacco: Never Used  . Alcohol Use: No   OB History   Grav Para Term Preterm Abortions TAB SAB Ect Mult Living                 Review of Systems  Constitutional: Negative for fever.  HENT: Positive for congestion and ear pain. Negative for ear discharge and tinnitus.   Respiratory:  Negative for cough.   Gastrointestinal: Negative for vomiting, abdominal pain and diarrhea.  All other systems reviewed and are negative.    Allergies  Codeine  Home Medications   Current Outpatient Rx  Name  Route  Sig  Dispense  Refill  . divalproex (DEPAKOTE) 500 MG EC tablet   Oral   Take 1,500 mg by mouth at bedtime.           . gabapentin (NEURONTIN) 400 MG tablet   Oral   Take 400 mg by mouth 3 (three) times daily.           . insulin lispro (HUMALOG) 100 UNIT/ML injection   Subcutaneous   Inject into the skin 3 (three) times daily before meals. Pump         . lamoTRIgine (LAMICTAL) 150 MG tablet   Oral   Take 150 mg by mouth daily.         Marland Kitchen levothyroxine (SYNTHROID, LEVOTHROID) 100 MCG tablet   Oral   Take 100 mcg by mouth daily.         . prochlorperazine (COMPAZINE) 5 MG tablet   Oral   Take 1 tablet (5 mg total) by mouth every 6 (six) hours as needed for nausea or vomiting (5-10 mg every 6 hours as needed for nausea).   60 tablet  0   . saccharomyces boulardii (FLORASTOR) 250 MG capsule   Oral   Take 1 capsule (250 mg total) by mouth 2 (two) times daily.   30 capsule   6   . AMBULATORY NON FORMULARY MEDICATION      Humalog Pump  Use as directed         . AMBULATORY NON FORMULARY MEDICATION   Oral   Take 10 mg by mouth 3 (three) times daily with meals as needed. Medication Name: Domperidone   240 tablet   11   . cephALEXin (KEFLEX) 500 MG capsule   Oral   Take 1 capsule (500 mg total) by mouth 4 (four) times daily.   28 capsule   0   . fluconazole (DIFLUCAN) 150 MG tablet   Oral   Take 1 tablet (150 mg total) by mouth once.   1 tablet   0   . meloxicam (MOBIC) 7.5 MG tablet   Oral   Take 1 tablet (7.5 mg total) by mouth daily.   7 tablet   0   . neomycin-polymyxin-hydrocortisone (CORTISPORIN) 3.5-10000-1 otic suspension   Left Ear   Place 3 drops into the left ear 4 (four) times daily. X 7 days   10 mL   0   .  sulfamethoxazole-trimethoprim (BACTRIM DS) 800-160 MG per tablet   Oral   Take 1 tablet by mouth 2 (two) times daily. For 10 days   200 tablet   0    BP 112/77  Pulse 80  Temp(Src) 97.7 F (36.5 C) (Oral)  Resp 20  SpO2 100% Physical Exam  Constitutional: She appears well-developed and well-nourished. No distress.  HENT:  Head: Normocephalic and atraumatic.  Right Ear: External ear normal. No drainage. No mastoid tenderness. Tympanic membrane is not injected, not scarred, not perforated, not erythematous and not bulging. No hemotympanum.  Left Ear: External ear normal. No drainage. No mastoid tenderness. Tympanic membrane is not injected, not scarred, not perforated, not erythematous and not bulging. No hemotympanum.  Mouth/Throat: Oropharynx is clear and moist. No oropharyngeal exudate.  Eyes: Conjunctivae are normal. Pupils are equal, round, and reactive to light.  Neck: Normal range of motion. Neck supple.  Cardiovascular: Normal rate, regular rhythm and intact distal pulses.   Pulmonary/Chest: Effort normal and breath sounds normal. She has no wheezes.  Abdominal: Soft. Bowel sounds are normal. There is no tenderness.  Musculoskeletal: Normal range of motion.  Neurological: She is alert.  Skin: Skin is warm and dry.  Psychiatric: She has a normal mood and affect.    ED Course  Procedures (including critical care time) Labs Review Labs Reviewed - No data to display Imaging Review No results found.  EKG Interpretation   None       MDM   1. Otalgia of left ear    Have advised decongestants and drops and will prescribe pain medication.  Return for fevers or any concerning symptoms.     Jasmine Awe, MD 08/22/13 [phone]

## 2013-09-11 IMAGING — CR DG CHEST 2V
2 series · 2 of 2 positions shown · non-contrast
Comparison: 10/18/2009.

CLINICAL DATA: Fever.

EXAM:
CHEST  2 VIEW

[w chest pa]
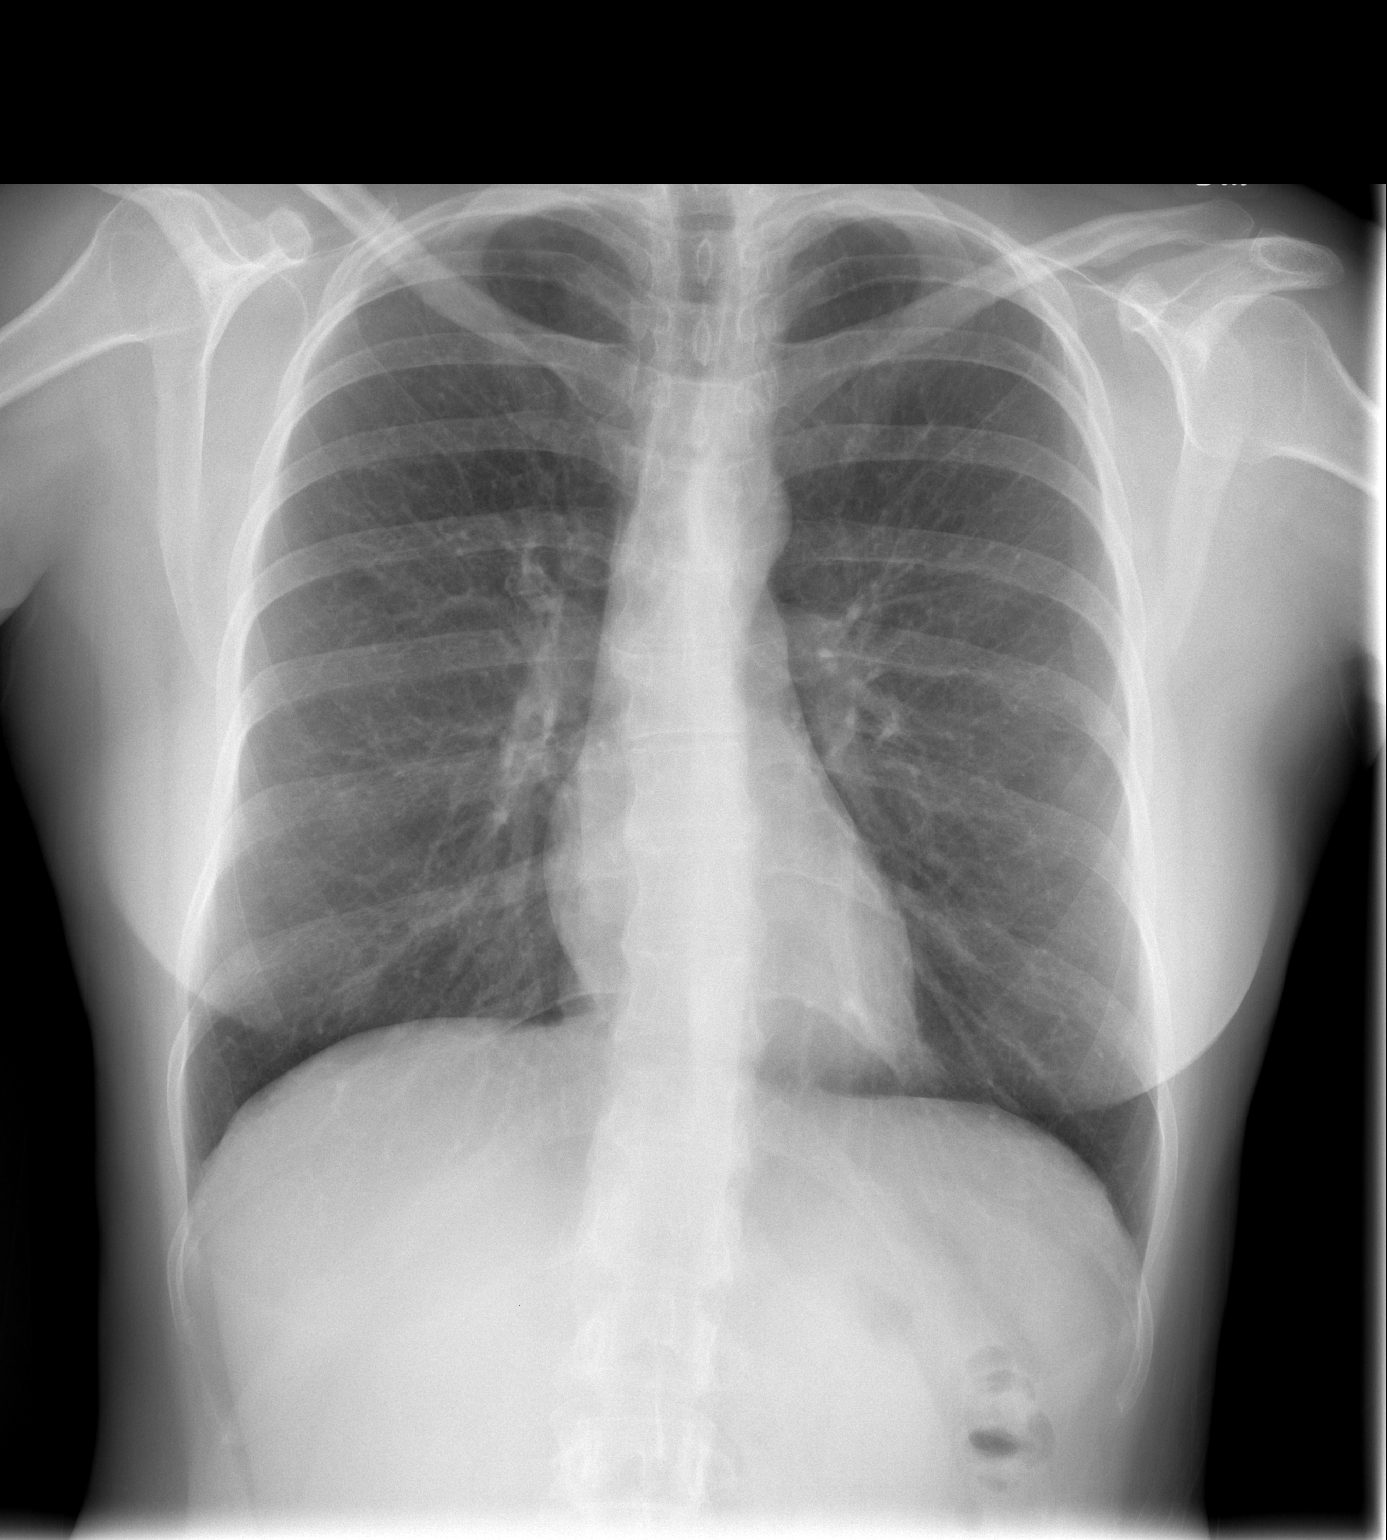

[w chest lat]
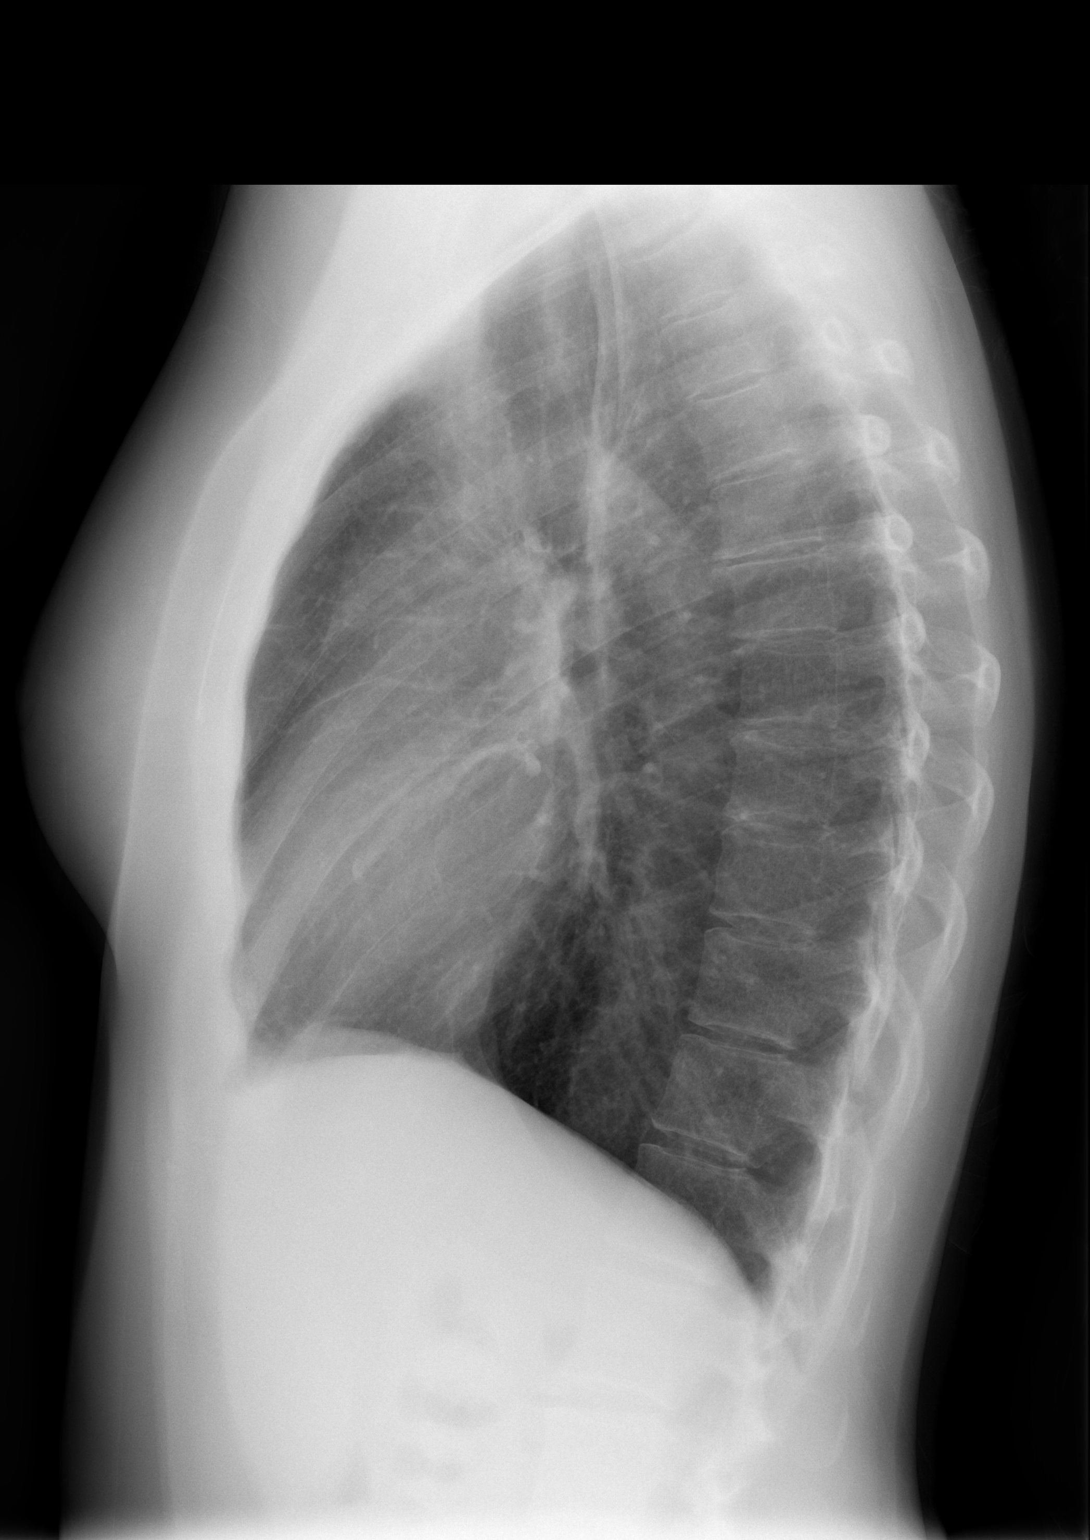

[2 of 2 positions shown; findings below may reference images not displayed]

FINDINGS: The heart remains normal in size and the lungs remain clear and
mildly hyperexpanded. Stable mild scoliosis.
IMPRESSION: Stable mild changes of COPD. No acute abnormality.

## 2013-09-28 DIAGNOSIS — E039 Hypothyroidism, unspecified: Secondary | ICD-10-CM | POA: Diagnosis not present

## 2013-09-28 DIAGNOSIS — G609 Hereditary and idiopathic neuropathy, unspecified: Secondary | ICD-10-CM | POA: Diagnosis not present

## 2013-09-28 DIAGNOSIS — E109 Type 1 diabetes mellitus without complications: Secondary | ICD-10-CM | POA: Diagnosis not present

## 2013-09-28 DIAGNOSIS — N390 Urinary tract infection, site not specified: Secondary | ICD-10-CM | POA: Diagnosis not present

## 2013-09-28 DIAGNOSIS — E7800 Pure hypercholesterolemia, unspecified: Secondary | ICD-10-CM | POA: Diagnosis not present

## 2013-10-02 ENCOUNTER — Other Ambulatory Visit: Payer: Self-pay | Admitting: Internal Medicine

## 2013-11-09 DIAGNOSIS — J069 Acute upper respiratory infection, unspecified: Secondary | ICD-10-CM | POA: Diagnosis not present

## 2013-11-09 DIAGNOSIS — E109 Type 1 diabetes mellitus without complications: Secondary | ICD-10-CM | POA: Diagnosis not present

## 2014-01-01 DIAGNOSIS — E109 Type 1 diabetes mellitus without complications: Secondary | ICD-10-CM | POA: Diagnosis not present

## 2014-01-01 DIAGNOSIS — E7800 Pure hypercholesterolemia, unspecified: Secondary | ICD-10-CM | POA: Diagnosis not present

## 2014-01-01 DIAGNOSIS — G609 Hereditary and idiopathic neuropathy, unspecified: Secondary | ICD-10-CM | POA: Diagnosis not present

## 2014-01-01 DIAGNOSIS — E039 Hypothyroidism, unspecified: Secondary | ICD-10-CM | POA: Diagnosis not present

## 2014-01-09 ENCOUNTER — Other Ambulatory Visit: Payer: Self-pay | Admitting: Internal Medicine

## 2014-01-26 ENCOUNTER — Inpatient Hospital Stay (HOSPITAL_COMMUNITY): Payer: Medicare Other

## 2014-01-26 ENCOUNTER — Ambulatory Visit (INDEPENDENT_AMBULATORY_CARE_PROVIDER_SITE_OTHER): Payer: Medicare Other | Admitting: Internal Medicine

## 2014-01-26 ENCOUNTER — Encounter (HOSPITAL_COMMUNITY): Payer: Self-pay | Admitting: *Deleted

## 2014-01-26 ENCOUNTER — Inpatient Hospital Stay (HOSPITAL_COMMUNITY)
Admission: AD | Admit: 2014-01-26 | Discharge: 2014-01-29 | DRG: 073 | Disposition: A | Discharge status: Home / Self Care | Payer: Medicare Other | Source: Ambulatory Visit | Attending: Internal Medicine | Admitting: Internal Medicine

## 2014-01-26 ENCOUNTER — Encounter: Payer: Self-pay | Admitting: Internal Medicine

## 2014-01-26 VITALS — BP 96/54 | HR 98 | Temp 97.8°F | Ht 61.0 in | Wt 110.0 lb

## 2014-01-26 DIAGNOSIS — E1143 Type 2 diabetes mellitus with diabetic autonomic (poly)neuropathy: Secondary | ICD-10-CM

## 2014-01-26 DIAGNOSIS — R109 Unspecified abdominal pain: Secondary | ICD-10-CM

## 2014-01-26 DIAGNOSIS — E43 Unspecified severe protein-calorie malnutrition: Secondary | ICD-10-CM | POA: Diagnosis not present

## 2014-01-26 DIAGNOSIS — F319 Bipolar disorder, unspecified: Secondary | ICD-10-CM | POA: Diagnosis present

## 2014-01-26 DIAGNOSIS — R112 Nausea with vomiting, unspecified: Secondary | ICD-10-CM

## 2014-01-26 DIAGNOSIS — Z833 Family history of diabetes mellitus: Secondary | ICD-10-CM

## 2014-01-26 DIAGNOSIS — E1049 Type 1 diabetes mellitus with other diabetic neurological complication: Secondary | ICD-10-CM | POA: Diagnosis not present

## 2014-01-26 DIAGNOSIS — E1149 Type 2 diabetes mellitus with other diabetic neurological complication: Secondary | ICD-10-CM | POA: Diagnosis not present

## 2014-01-26 DIAGNOSIS — Z79899 Other long term (current) drug therapy: Secondary | ICD-10-CM | POA: Diagnosis not present

## 2014-01-26 DIAGNOSIS — R627 Adult failure to thrive: Secondary | ICD-10-CM | POA: Diagnosis present

## 2014-01-26 DIAGNOSIS — K5900 Constipation, unspecified: Secondary | ICD-10-CM

## 2014-01-26 DIAGNOSIS — Z8249 Family history of ischemic heart disease and other diseases of the circulatory system: Secondary | ICD-10-CM | POA: Diagnosis not present

## 2014-01-26 DIAGNOSIS — E785 Hyperlipidemia, unspecified: Secondary | ICD-10-CM | POA: Diagnosis present

## 2014-01-26 DIAGNOSIS — R634 Abnormal weight loss: Secondary | ICD-10-CM | POA: Diagnosis present

## 2014-01-26 DIAGNOSIS — Z9641 Presence of insulin pump (external) (internal): Secondary | ICD-10-CM

## 2014-01-26 DIAGNOSIS — E1069 Type 1 diabetes mellitus with other specified complication: Secondary | ICD-10-CM | POA: Diagnosis not present

## 2014-01-26 DIAGNOSIS — K3184 Gastroparesis: Secondary | ICD-10-CM | POA: Diagnosis present

## 2014-01-26 DIAGNOSIS — E109 Type 1 diabetes mellitus without complications: Secondary | ICD-10-CM

## 2014-01-26 DIAGNOSIS — G40909 Epilepsy, unspecified, not intractable, without status epilepticus: Secondary | ICD-10-CM | POA: Diagnosis present

## 2014-01-26 DIAGNOSIS — IMO0002 Reserved for concepts with insufficient information to code with codable children: Secondary | ICD-10-CM

## 2014-01-26 DIAGNOSIS — E860 Dehydration: Secondary | ICD-10-CM | POA: Diagnosis present

## 2014-01-26 DIAGNOSIS — Z794 Long term (current) use of insulin: Secondary | ICD-10-CM | POA: Diagnosis not present

## 2014-01-26 DIAGNOSIS — F17200 Nicotine dependence, unspecified, uncomplicated: Secondary | ICD-10-CM | POA: Diagnosis present

## 2014-01-26 DIAGNOSIS — R05 Cough: Secondary | ICD-10-CM | POA: Diagnosis present

## 2014-01-26 DIAGNOSIS — R1115 Cyclical vomiting syndrome unrelated to migraine: Secondary | ICD-10-CM

## 2014-01-26 DIAGNOSIS — I10 Essential (primary) hypertension: Secondary | ICD-10-CM | POA: Diagnosis present

## 2014-01-26 DIAGNOSIS — R059 Cough, unspecified: Secondary | ICD-10-CM | POA: Diagnosis not present

## 2014-01-26 DIAGNOSIS — R569 Unspecified convulsions: Secondary | ICD-10-CM | POA: Diagnosis present

## 2014-01-26 HISTORY — DX: Constipation, unspecified: K59.00

## 2014-01-26 LAB — COMPREHENSIVE METABOLIC PANEL
ALT: 16 U/L (ref 0–35)
AST: 16 U/L (ref 0–37)
Albumin: 3.9 g/dL (ref 3.5–5.2)
Alkaline Phosphatase: 104 U/L (ref 39–117)
BILIRUBIN TOTAL: 0.6 mg/dL (ref 0.3–1.2)
BUN: 5 mg/dL — AB (ref 6–23)
CO2: 22 mEq/L (ref 19–32)
Calcium: 9.7 mg/dL (ref 8.4–10.5)
Chloride: 101 mEq/L (ref 96–112)
Creatinine, Ser: 0.55 mg/dL (ref 0.50–1.10)
GFR calc non Af Amer: >90 mL/min (ref >90)
GLUCOSE: 164 mg/dL — AB (ref 70–99)
Potassium: 3.9 mEq/L (ref 3.7–5.3)
Sodium: 136 mEq/L — ABNORMAL LOW (ref 137–147)
TOTAL PROTEIN: 6.9 g/dL (ref 6.0–8.3)

## 2014-01-26 LAB — CBC WITH DIFFERENTIAL/PLATELET
BASOS PCT: 0 % (ref 0–1)
Basophils Absolute: 0 10*3/uL (ref 0.0–0.1)
EOS ABS: 0.1 10*3/uL (ref 0.0–0.7)
Eosinophils Relative: 1 % (ref 0–5)
HCT: 42.5 % (ref 36.0–46.0)
Hemoglobin: 14.6 g/dL (ref 12.0–15.0)
Lymphocytes Relative: 26 % (ref 12–46)
Lymphs Abs: 2.3 10*3/uL (ref 0.7–4.0)
MCH: 28 pg (ref 26.0–34.0)
MCHC: 34.4 g/dL (ref 30.0–36.0)
MCV: 81.6 fL (ref 78.0–100.0)
Monocytes Absolute: 0.6 10*3/uL (ref 0.1–1.0)
Monocytes Relative: 7 % (ref 3–12)
NEUTROS PCT: 66 % (ref 43–77)
Neutro Abs: 5.9 10*3/uL (ref 1.7–7.7)
PLATELETS: 339 10*3/uL (ref 150–400)
RBC: 5.21 MIL/uL — AB (ref 3.87–5.11)
RDW: 13.1 % (ref 11.5–15.5)
WBC: 8.9 10*3/uL (ref 4.0–10.5)

## 2014-01-26 LAB — PROTIME-INR
INR: 1.04 (ref 0.00–1.49)
PROTHROMBIN TIME: 13.4 s (ref 11.6–15.2)

## 2014-01-26 LAB — GLUCOSE, CAPILLARY
Glucose-Capillary: 163 mg/dL — ABNORMAL HIGH (ref 70–99)
Glucose-Capillary: 155 mg/dL — ABNORMAL HIGH (ref 70–99)

## 2014-01-26 LAB — PHOSPHORUS: Phosphorus: 3 mg/dL (ref 2.3–4.6)

## 2014-01-26 LAB — MAGNESIUM: MAGNESIUM: 1.8 mg/dL (ref 1.5–2.5)

## 2014-01-26 MED ORDER — ACETAMINOPHEN 650 MG RE SUPP: 650.0000 mg | Freq: Four times a day (QID) | RECTAL | Status: DC | PRN

## 2014-01-26 MED ORDER — ENOXAPARIN SODIUM 40 MG/0.4ML ~~LOC~~ SOLN
40.0000 mg | SUBCUTANEOUS | Status: DC
  Administered 2014-01-26 – 2014-01-28 (×3): 40 mg via SUBCUTANEOUS
  Filled 2014-01-26 (×4): qty 0.4

## 2014-01-26 MED ORDER — INSULIN PUMP
Freq: Three times a day (TID) | SUBCUTANEOUS | Status: DC
  Administered 2014-01-27: 12:00:00 via SUBCUTANEOUS
  Filled 2014-01-26: qty 1

## 2014-01-26 MED ORDER — LEVOTHYROXINE SODIUM 100 MCG PO TABS
100.0000 ug | ORAL_TABLET | Freq: Every day | ORAL | Status: DC
  Administered 2014-01-27 – 2014-01-29 (×3): 100 ug via ORAL
  Filled 2014-01-26 (×5): qty 1

## 2014-01-26 MED ORDER — SENNA 8.6 MG PO TABS
1.0000 | ORAL_TABLET | Freq: Two times a day (BID) | ORAL | Status: DC
  Administered 2014-01-26 – 2014-01-28 (×4): 8.6 mg via ORAL
  Filled 2014-01-26 (×4): qty 1

## 2014-01-26 MED ORDER — MORPHINE SULFATE 2 MG/ML IJ SOLN
1.0000 mg | INTRAMUSCULAR | Status: DC | PRN
  Administered 2014-01-26 – 2014-01-27 (×5): 2 mg via INTRAVENOUS
  Filled 2014-01-26 (×5): qty 1

## 2014-01-26 MED ORDER — SODIUM CHLORIDE 0.9 % IV SOLN
150.0000 mg | Freq: Three times a day (TID) | INTRAVENOUS | Status: DC
  Administered 2014-01-26 – 2014-01-29 (×8): 150 mg via INTRAVENOUS
  Filled 2014-01-26 (×13): qty 3

## 2014-01-26 MED ORDER — DOCUSATE SODIUM 100 MG PO CAPS
100.0000 mg | ORAL_CAPSULE | Freq: Two times a day (BID) | ORAL | Status: DC
  Administered 2014-01-26 – 2014-01-28 (×4): 100 mg via ORAL
  Filled 2014-01-26 (×5): qty 1

## 2014-01-26 MED ORDER — SODIUM CHLORIDE 0.9 % IV SOLN
INTRAVENOUS | Status: DC
  Administered 2014-01-26 – 2014-01-28 (×4): via INTRAVENOUS

## 2014-01-26 MED ORDER — ACETAMINOPHEN 325 MG PO TABS: 650.0000 mg | ORAL_TABLET | Freq: Four times a day (QID) | ORAL | Status: DC | PRN

## 2014-01-26 MED ORDER — NICOTINE 21 MG/24HR TD PT24
21.0000 mg | MEDICATED_PATCH | Freq: Every day | TRANSDERMAL | Status: DC
  Administered 2014-01-27 – 2014-01-29 (×3): 21 mg via TRANSDERMAL
  Filled 2014-01-26 (×4): qty 1

## 2014-01-26 MED ORDER — PROMETHAZINE HCL 25 MG/ML IJ SOLN
12.5000 mg | Freq: Four times a day (QID) | INTRAMUSCULAR | Status: DC | PRN
  Administered 2014-01-26 – 2014-01-29 (×4): 12.5 mg via INTRAVENOUS
  Filled 2014-01-26 (×4): qty 1

## 2014-01-26 MED ORDER — BISACODYL 10 MG RE SUPP: 10.0000 mg | Freq: Every day | RECTAL | Status: DC | PRN

## 2014-01-26 NOTE — H&P (Signed)
[facility] History and Physical  Karen Hayden JOA:[id number] DOB: 1976/07/03 DOA: 01/26/2014  Referring physician: Dr Karen Hayden PCP: Karen Pi, MD   Chief Complaint: persistent nausea and vomiting.   HPI: Karen Hayden is a 38 y.o. female with prior h/o type ! DM, gastroparesis, reports worsening nausea, vomiting and left lower quadrant abdominal pain associated with constipation. Her last BM was 3 days ago. She denies fever s or chills, sob, chest pain, palpitations. She reports productive cough. She was seen in Karen Hayden office and referred to medical service for admission directly, for supportive care for dehydration. Pt also reports weight loss of more than 20 lbs since decemeber. She reports her weight to be 130 in December 2014 and its 110 today. She will be admitted to med surg bed to [facility].    Review of Systems:  Constitutional:  \, night sweats, Fevers, chills, fatigue. Weight loss present.  HEENT:  No headaches, Difficulty swallowing,Tooth/dental problems,Sore throat,  No sneezing, itching, ear ache, nasal congestion, post nasal drip,  Cardio-vascular:  No chest pain, Orthopnea, PND, swelling in lower extremities, anasarca, dizziness, palpitations  GI:  Positive for nausea, vomiting, abdominal pain, constipation.  Resp:  No shortness of breath with exertion or at rest. No excess mucus,  No coughing up of blood.No change in color of mucus.No wheezing.No chest wall deformity . Positive for productive cough.  Skin:  no rash or lesions.  GU:  no dysuria, change in color of urine, no urgency or frequency. No flank pain.  Musculoskeletal:  generalized joint pains.  Psych:  No change in mood or affect. No depression or anxiety. No memory loss.   Past Medical History  Diagnosis Date  . DM (diabetes mellitus)   . Diabetic gastroparesis   . Seizure disorder   . Hypertension   . Umbilical hernia   . Hyperlipidemia   . Thyroid disease   . Gastroparesis     Due  to diabetes   . Bipolar 1 disorder    Past Surgical History  Procedure Laterality Date  . Vaginal hysterectomy    . Tonsillectomy     Social History:  reports that she has been smoking Cigarettes.  She has been smoking about 2.00 packs per day. She has never used smokeless tobacco. She reports that she does not drink alcohol or use illicit drugs.  Allergies  Allergen Reactions  . Lipitor [Atorvastatin]     Attacks joints, inflamation  . Codeine Rash    Family History  Problem Relation Age of Onset  . Diabetes Mother   . Heart attack Mother   . Ovarian cancer Paternal Grandmother     great grandmother     Prior to Admission medications   Medication Sig Start Date End Date Taking? Authorizing Provider  ezetimibe (ZETIA) 10 MG tablet Take 10 mg by mouth daily.   Yes Historical Provider, MD  Insulin Human (INSULIN PUMP) SOLN Inject into the skin continuous. humalog   Yes Historical Provider, MD  levothyroxine (SYNTHROID, LEVOTHROID) 100 MCG tablet Take 100 mcg by mouth daily.   Yes Historical Provider, MD  prochlorperazine (COMPAZINE) 5 MG tablet Take 5 mg by mouth every 6 (six) hours as needed for nausea or vomiting.   Yes Historical Provider, MD  saccharomyces boulardii (FLORASTOR) 250 MG capsule Take 1 capsule (250 mg total) by mouth 2 (two) times daily. 07/21/13  Yes Karen Bears, MD   Physical Exam: Filed Vitals:   01/26/14 1725  BP: 124/78  Pulse: 73  Temp: 97.8 F (36.6 C)  Resp: 20    BP 124/78  Pulse 73  Temp(Src) 97.8 F (36.6 C) (Oral)  Resp 20  Ht 5\' 1"  (1.549 m)  Wt 49.896 kg (110 lb)  BMI 20.80 kg/m2  SpO2 100%  General:  Appears calm and comfortable Eyes: PERRL, normal lids, irises & conjunctiva ENT: grossly normal hearing, lips & tongue Neck: no LAD, masses or thyromegaly Cardiovascular: RRR, no m/r/g. No LE edema.  Respiratory: CTA bilaterally, no w/r/r. Normal respiratory effort. Abdomen: soft,  Mild tnderness in the LLQ . NOT distended,.    Skin: no rash or induration seen on limited exam, multiple tattoes over her back and arms Musculoskeletal: grossly normal tone BUE/BLE Psychiatric: grossly normal mood and affect, speech fluent and appropriate Neurologic: grossly non-focal.          Labs on Admission:  Basic Metabolic Panel:  Recent Labs Lab 01/26/14 1720  NA 136*  K 3.9  CL 101  CO2 22  GLUCOSE 164*  BUN 5*  CREATININE 0.55  CALCIUM 9.7  MG 1.8  PHOS 3.0   Liver Function Tests:  Recent Labs Lab 01/26/14 1720  AST 16  ALT 16  ALKPHOS 104  BILITOT 0.6  PROT 6.9  ALBUMIN 3.9   No results found for this basename: LIPASE, AMYLASE,  in the last 168 hours No results found for this basename: AMMONIA,  in the last 168 hours CBC:  Recent Labs Lab 01/26/14 1720  WBC 8.9  NEUTROABS 5.9  HGB 14.6  HCT 42.5  MCV 81.6  PLT 339   Cardiac Enzymes: No results found for this basename: CKTOTAL, CKMB, CKMBINDEX, TROPONINI,  in the last 168 hours  BNP (last 3 results) No results found for this basename: PROBNP,  in the last 8760 hours CBG:  Recent Labs Lab 01/26/14 1626  GLUCAP 163*    Radiological Exams on Admission: Dg Chest 2 View  01/26/2014   CLINICAL DATA:  Productive cough, smoker  EXAM: CHEST  2 VIEW  COMPARISON:  04/24/2013  FINDINGS: The heart size and mediastinal contours are within normal limits. Both lungs are clear. The visualized skeletal structures are unremarkable.  IMPRESSION: No active cardiopulmonary disease.   Electronically Signed   By: Karen Hayden   On: 01/26/2014 18:34   Dg Abd 2 Views  01/26/2014   CLINICAL DATA:  Abdominal pain  EXAM: ABDOMEN - 2 VIEW  COMPARISON:  05/11/2012  FINDINGS: Scattered large and small bowel gas is noted. No free air is seen. Mild fecal material is noted within the colon. No abnormal mass or abnormal calcifications are seen. No bony abnormality is noted.  IMPRESSION: No acute abnormality seen.   Electronically Signed   By: Karen Hayden M.D.   On:  01/26/2014 18:35      Assessment/Plan Active Problems:   DIABETES MELLITUS, TYPE I   SEIZURE DISORDER   Diabetic gastroparesis   Nausea with vomiting   Nausea & vomiting   Unspecified constipation   Cough   Tobacco use disorder   Persistent nausea and vomiting :  - possibly flare up of her gastroparesis.  - admittd to medsurg, started her on IV fluids for dehydration, .  - stat labs ordered and within normal limits.  - IV erythromycin, and IV phenergan to improve motility.  - GI will see her inconsultation in am.   Diabetes Mellitus: she reports her hgba1c is 8.2, worsened .  - she will use pump and insulin pump order set  on board.    Abdominal pain: Llq. abd film does not show constipation.  Continue to monitor.   Weight loss and failure to thrive: - possibly fromt he worsening gastroparesis and decreased intake of nutrition.  - nutrition consult.  - if abd pain does nt' improve will get CT abd and pelvis.    DVT prophylaxis.     Code Status: full code Family Communication: none atbedside.  Disposition Plan: admit to medsurg.   Time spent:65 min  Hosie Poisson [facility] Pager [phone]  **Disclaimer: This note may have been dictated with voice recognition software. Similar sounding words can inadvertently be transcribed and this note may contain transcription errors which may not have been corrected upon publication of note.**

## 2014-01-26 NOTE — Progress Notes (Signed)
Subjective:    Patient ID: Karen Hayden, female    DOB: 09-04-1975, 38 y.o.   MRN: [id number]  HPI Karen Hayden is a 38 year old female with a past medical history of type 1 diabetes with insulin pump, hypertension, hyperlipidemia, seizure disorder and gastroparesis who is seen for followup. She was last on 07/14/2013 at which time she was having ongoing issues with nausea and vomiting. Domperidone was continued her gastroparesis and Compazine was restarted because promethazine had lost its efficacy. She was also  Treated with Bactrim for a skin infection in her left midabdomen at the site of her insulin pump. Today she reports she's had a very difficult month with severe nausea and vomiting, particularly worse over the last week. She said while fluctuations in her blood sugars and reports "very high highs" but also that it has been low recently because she's been unable to eat or drink much. She reports she's had constipation and not had a bowel movement in 3 days which is atypical for her. She reports only having a bowel movement daily. She reports some bilateral lower abdominal discomfort which he thinks is related to her constipation. She reports with any attempt at by mouth intake she has nausea with vomiting. She denies hematemesis, melena or blood per rectum. She has been trying to use domperidone and Compazine but they have been unhelpful from a nausea standpoint. She doesn't think she can make it at home with this much vomiting.  There is some stress going on in her life. She reports her oldest daughter is in jail. Her youngest daughter, 72, is in high school. She is married her husband is currently at work.  Review of Systems As per history of present illness, otherwise negative    Objective:   Physical Exam BP 96/54  Pulse 98  Ht 5\' 1"  (1.549 m)  Wt 110 lb (49.896 kg)  BMI 20.80 kg/m2 Constitutional: Thin, chronically ill-appearing female in no acute distress, tearful when discussing symptoms  . HEENT: Normocephalic and atraumatic. Oropharynx is clear and dry. No oropharyngeal exudate. Conjunctivae are normal.  No scleral icterus. Neck: Neck supple. Trachea midline. Cardiovascular: Mild tachycardiawith regular rhythm Pulmonary/chest: Effort normal and breath sounds normal. No wheezing, rales or rhonchi. Abdominal: Soft, mild diffuse tenderness without rebound or guarding, nondistended. Bowel sounds active throughout. Insulin pump left lower quadrant Extremities: no clubbing, cyanosis, or edema Neurological: Alert and oriented to person place and time. Skin: Skin is warm and dry. No rashes noted. Notable tattoos Psychiatric: Normal mood and affect. Behavior is normal.   CT ABDOMEN AND PELVIS WITH CONTRAST -- 06/30/2013   TECHNIQUE: Multidetector CT imaging of the abdomen and pelvis was performed using the standard protocol following bolus administration of intravenous contrast. Oral contrast was also administered.   CONTRAST:  142mL OMNIPAQUE IOHEXOL 300 MG/ML  SOLN   COMPARISON:  December 24, 2003   FINDINGS: Lung bases are clear.   The liver is enlarged, measuring 18 cm in length. No focal liver lesions are identified. There is no biliary duct dilatation.   Spleen, pancreas, and adrenals appear normal. The kidneys bilaterally show no appreciable mass or hydronephrosis. There is no apparent ureteral calculus or ureterectasis on either side.   In the pelvis, there is a 2.1 x 2.2 cm dominant follicle in the right ovary. There is no other pelvic mass. There is no pelvic fluid collection. There is no periappendiceal region inflammation.   There is no bowel obstruction. No free air or portal venous air.  There is no ascites, adenopathy, or abscess in the abdomen or pelvis. Aorta is nonaneurysmal. There are no blastic or lytic bone lesions.   IMPRESSION: No inflammatory focus or bowel obstruction. No abscess.   Liver enlarged but otherwise normal in appearance.    Dominant follicle right ovary.     Assessment & Plan:  38 year old female with a past medical history of type 1 diabetes with insulin pump, hypertension, hyperlipidemia, seizure disorder and gastroparesis who is seen for followup.  1.  Intractable nausea vomiting/dehydration/diabetic gastroparesis -- Karen Hayden does appear dehydrated today and given her history of gastroparesis and now intractable nausea and vomiting I have recommended admission to the hospital. She is in agreement. I discussed her case with Dr. Romilda Joy, the hospitalist at [facility] who has accepted her to the hospitalist service. She will go from here to admissions and await a bed assignment. Blood glucose is 93 here. I expect she will need IV hydration, glucose management, anti-emetics, and likely IV erythromycin to help improve gastric motility. Inpatient team can consider repeat EGD, last in 2012, to exclude other causes of ongoing nausea and vomiting.  2.  Constipation -- not normally a problem but likely as a result of dehydration. Dulcolax suppository as inpatient and symptoms expected to improve with hydration  3.  Diabetes type 1 -- glucose management per hospital team  --labs to be done once at hospital

## 2014-01-26 NOTE — Progress Notes (Signed)
Inpatient Diabetes Program Recommendations  AACE/ADA: New Consensus Statement on Inpatient Glycemic Control (2013)  Target Ranges:  Prepandial:   less than 140 mg/dL      Peak postprandial:   less than 180 mg/dL (1-2 hours)      Critically ill patients:  140 - 180 mg/dL   Reason for Assessment: Call received from RN regarding insulin pump. Diabetes history:Type 1 diabetes-She see's Dr. Chalmers Cater Outpatient Diabetes medications: Medtronic insulin pump Basal settings/ 24 hour basal total=17.1 units/24 hours 12a-0.55 units/hr 4a-0.85 units/hr 9a-0.75 units/hr 12p-0.80 units/hr 9p-  0.4 units/hr  Her insulin to CHO ratio is 1 units/10 grams of CHO, Her correction factor is 1 units for every 50 mg/dL greater than 140 mg/dL.   Discussed insulin pump policy with patient and RN.  RN states she will call MD to get order for insulin pump while patient is in the hospital.  Patient states that she changed out her site this morning.  She understands that she needs to have her own insulin pump supplies.   Thanks, Adah Perl, RN, BC-ADM Inpatient Diabetes Coordinator Pager [phone]

## 2014-01-26 NOTE — Progress Notes (Signed)
Patient verbalized understanding of Insulin Pump instructions for hospital use. Flow sheet is in room and signed contract is in chart.

## 2014-01-27 DIAGNOSIS — E1149 Type 2 diabetes mellitus with other diabetic neurological complication: Secondary | ICD-10-CM | POA: Diagnosis not present

## 2014-01-27 DIAGNOSIS — E109 Type 1 diabetes mellitus without complications: Secondary | ICD-10-CM

## 2014-01-27 DIAGNOSIS — R109 Unspecified abdominal pain: Secondary | ICD-10-CM

## 2014-01-27 DIAGNOSIS — R112 Nausea with vomiting, unspecified: Secondary | ICD-10-CM

## 2014-01-27 DIAGNOSIS — K3184 Gastroparesis: Secondary | ICD-10-CM

## 2014-01-27 LAB — GLUCOSE, CAPILLARY
GLUCOSE-CAPILLARY: 52 mg/dL — AB (ref 70–99)
GLUCOSE-CAPILLARY: 71 mg/dL (ref 70–99)
Glucose-Capillary: 59 mg/dL — ABNORMAL LOW (ref 70–99)
Glucose-Capillary: 85 mg/dL (ref 70–99)
Glucose-Capillary: 128 mg/dL — ABNORMAL HIGH (ref 70–99)
Glucose-Capillary: 172 mg/dL — ABNORMAL HIGH (ref 70–99)
Glucose-Capillary: 107 mg/dL — ABNORMAL HIGH (ref 70–99)
Glucose-Capillary: 123 mg/dL — ABNORMAL HIGH (ref 70–99)

## 2014-01-27 LAB — TSH: TSH: 1.33 u[IU]/mL (ref 0.350–4.500)

## 2014-01-27 MED ORDER — POLYETHYLENE GLYCOL 3350 17 G PO PACK
17.0000 g | PACK | Freq: Every day | ORAL | Status: DC
  Administered 2014-01-27 – 2014-01-28 (×2): 17 g via ORAL
  Filled 2014-01-27 (×2): qty 1

## 2014-01-27 MED ORDER — BISACODYL 10 MG RE SUPP
10.0000 mg | Freq: Once | RECTAL | Status: AC
  Administered 2014-01-27: 10 mg via RECTAL
  Filled 2014-01-27: qty 1

## 2014-01-27 MED ORDER — MAGNESIUM HYDROXIDE 400 MG/5ML PO SUSP: 15.0000 mL | Freq: Every day | ORAL | Status: DC | PRN

## 2014-01-27 NOTE — Progress Notes (Signed)
Hypoglycemic Event  CBG: 59  Treatment: 4 oz orange juice  Symptoms: none  Follow-up CBG: Time:0221 CBG Result:85  Possible Reasons for Event: inadequate food intake  Comments/MD notified:M. Lynch    Karen Hayden Karen Hayden  Remember to initiate Hypoglycemia Order Set & completeAdult Hypoglycemia Protocol Treatment Guidelines  1. RN shall initiate Hypoglycemia Protocol emergency measures immediately when:            w        Routine or STAT CBG and/or a lab glucose indicates hypoglycemia (CBG < 70 mg/dl)  2. Treat the patient according to ability to take PO's and severity of hypoglycemia.   3. If patient is on GlucoStabilizer, follow directions provided by the [facility] for hypoglycemic events.  4. If patient on insulin pump, follow Hypoglycemia Protocol.  If patient requires more than one treatment have patient place pump in SUSPEND and notify MD.  DO NOT leave pump in SUSPEND for greater than 30 minutes unless ordered by MD.  A. Treatment for Mild or Moderate-Patient cooperative and able to swallow    1.  Patient taking PO's and can cooperate   a.  Give one of the following 15 gram CHO options:                           w     1 tube oral dextrose gel                           w     3-4 Glucose tablets                           w     4 oz. Juice                           w     4 oz. regular soda                                    ESRD patients:  clear, regular soda                           w     8 oz. skim milk    b.  Recheck CBG in 15 minutes after treatment                            w       If CBG < 70 mg/dl, repeat treatment and recheck until hypoglycemia is resolved                            w       If CBG > 70 mg/dl and next meal is more than 1 hour away, give additional 15 grams CHO   2.  Patient NPO-Patient cooperative and no altered mental status    a.  Give 25 ml of D50 IV.   b.  Recheck CBG in 15 minutes after treatment.                             w  If CBG is less than 70 mg/dl, repeat treatment and recheck until hypoglycemia is resolved.   c.  Notify MD for further orders.             SPECIAL CONSIDERATIONS:    a.  If no IV access,                              w        Start IV of D5W at [facility]                             w        Give 25 ml of D50 IV.    b.  If unable to gain IV access                             w          Give Glucagon IM:     i.  1 mg if patient weighs more than 45.5 kg     ii.  0.5 mg if patient weighs less than 45.5 kg   c.  Notify MD for further orders  B. Treatment for Severe-- Patient unconscious or unable to take PO's safely    1.  Position patient on side   2.  Give 50 ml D50 IV   3.  Recheck CBG in 15 minutes.                    w      If CBG is less than 70 mg/dl, repeat treatment and recheck until hypoglycemia is resolved.   4.  Notify MD for further orders.    SPECIAL CONSIDERATIONS:    a.  If no IV access                              w     Give Glucagon IM                                              i.  1 mg if patient weighs more than 45.5 kg                                             ii.  0.5 mg if patient weighs less than 45.5 kg                              w      Start IV of D5W at 50 ml/hr and give 50 ml D50 IV   b.  If no IV access and active seizure                               w       Call Rapid Response   c.  If unable to gain IV access, give Glucagon IM:  w          1 mg if patient weighs more than 45.5 kg                              w          0.5 mg if patient weighs less than 45.5 kg   d.  Notify MD for further orders.  C. Complete smart text progress note to document intervention and follow-up CBG   1. In [facility] patient chart, click on Notes (left side of screen)   2. Create Progress Note   3. Click on [employer].  In the Match box type "hypo" and enter    4. Double click on [facility] IP HYPOGLYCEMIC EVENT and enter data   5. MD  must be notified if patient is NPO or experienced severe hypoglycemia

## 2014-01-27 NOTE — Progress Notes (Signed)
[facility] PROGRESS NOTE  Karen Hayden JME:[id number] DOB: [birth year] DOA: 01/26/2014 PCP: Jacelyn Pi, MD Interim summary: Karen Hayden is a 38 y.o. female with prior h/o type ! DM, gastroparesis, reports worsening nausea, vomiting and left lower quadrant abdominal pain associated with constipation. Her last BM was 3 days ago. She denies fever s or chills, sob, chest pain, palpitations. She reports productive cough. She was seen in Dr Hilarie Fredrickson office and referred to medical service for admission directly, for supportive care for dehydration. Pt also reports weight loss of more than 20 lbs since decemeber. She reports her weight to be 130 in December 2014 and its 110 today. She will be admitted to med surg bed to [facility].   Assessment/Plan: Persistent nausea and vomiting :  - possibly worsening of her gastroparesis.  - admittd to medsurg, started her on IV fluids for dehydration, .  -  labs ordered and within normal limits.  - IV erythromycin, and IV phenergan to improve motility and nausea. She was able to keep fluids in , no vomiting. Will advance diet as tolerated.  - GI will see her inconsultation.  Diabetes Mellitus: she reports her hgba1c is 8.2, worsened .  - she will use insulin pump and insulin pump order set on board.     Abdominal pain:  Llq. abd film does not show constipation. Laxatives ordered.   Continue to monitor.   Weight loss and failure to thrive:  - possibly fromt he worsening gastroparesis and decreased intake of nutrition.  - nutrition consult.  - if abd pain does nt' improve will get CT abd and pelvis.   DVT prophylaxis.  Code Status: full code  Family Communication: family at bedside.   Disposition Plan: admit to medsurg.     Consultants:  GI  Procedures:  none  Antibiotics:  Erythromycin.   HPI/Subjective: An episode of hypoglycemia around 2 am, she reports asymptomatic.   Objective: Filed Vitals:   01/27/14 0530  BP: 93/49   Pulse: 55  Temp: 98 F (36.7 C)  Resp: 16    Intake/Output Summary (Last 24 hours) at 01/27/14 1005 Last data filed at 01/27/14 0200  Gross per 24 hour  Intake    240 ml  Output      0 ml  Net    240 ml   Filed Weights   01/26/14 1615  Weight: 49.896 kg (110 lb)    Exam:   General:  Alert afebrile comfortable  Cardiovascular: s1s2  Respiratory: ctab  Abdomen: soft MILD tenderness in the LLQ  Musculoskeletal: no pedal edema  Data Reviewed: Basic Metabolic Panel:  Recent Labs Lab 01/26/14 1720  NA 136*  K 3.9  CL 101  CO2 22  GLUCOSE 164*  BUN 5*  CREATININE 0.55  CALCIUM 9.7  MG 1.8  PHOS 3.0   Liver Function Tests:  Recent Labs Lab 01/26/14 1720  AST 16  ALT 16  ALKPHOS 104  BILITOT 0.6  PROT 6.9  ALBUMIN 3.9   No results found for this basename: LIPASE, AMYLASE,  in the last 168 hours No results found for this basename: AMMONIA,  in the last 168 hours CBC:  Recent Labs Lab 01/26/14 1720  WBC 8.9  NEUTROABS 5.9  HGB 14.6  HCT 42.5  MCV 81.6  PLT 339   Cardiac Enzymes: No results found for this basename: CKTOTAL, CKMB, CKMBINDEX, TROPONINI,  in the last 168 hours BNP (last 3 results) No results found for this basename: PROBNP,  in the last 8760 hours CBG:  Recent Labs Lab 01/26/14 1626 01/26/14 2158 01/27/14 0157 01/27/14 0221 01/27/14 0735  GLUCAP 163* 155* 59* 85 128*    No results found for this or any previous visit (from the past 240 hour(s)).   Studies: Dg Chest 2 View  01/26/2014   CLINICAL DATA:  Productive cough, smoker  EXAM: CHEST  2 VIEW  COMPARISON:  04/24/2013  FINDINGS: The heart size and mediastinal contours are within normal limits. Both lungs are clear. The visualized skeletal structures are unremarkable.  IMPRESSION: No active cardiopulmonary disease.   Electronically Signed   By: Kathreen Devoid   On: 01/26/2014 18:34   Dg Abd 2 Views  01/26/2014   CLINICAL DATA:  Abdominal pain  EXAM: ABDOMEN - 2 VIEW   COMPARISON:  05/11/2012  FINDINGS: Scattered large and small bowel gas is noted. No free air is seen. Mild fecal material is noted within the colon. No abnormal mass or abnormal calcifications are seen. No bony abnormality is noted.  IMPRESSION: No acute abnormality seen.   Electronically Signed   By: Inez Catalina M.D.   On: 01/26/2014 18:35    Scheduled Meds: . docusate sodium  100 mg Oral BID  . enoxaparin (LOVENOX) injection  40 mg Subcutaneous Q24H  . erythromycin  150 mg Intravenous 3 times per day  . insulin pump   Subcutaneous TID AC, HS, 0200  . levothyroxine  100 mcg Oral QAC breakfast  . nicotine  21 mg Transdermal Daily  . polyethylene glycol  17 g Oral Daily  . senna  1 tablet Oral BID   Continuous Infusions: . sodium chloride 125 mL/hr at 01/27/14 0203    Active Problems:   DIABETES MELLITUS, TYPE I   SEIZURE DISORDER   Diabetic gastroparesis   Nausea with vomiting   Nausea & vomiting   Unspecified constipation   Cough   Tobacco use disorder    Time spent: 78minutes    Hosie Poisson  [facility] Pager [phone] If 7PM-7AM, please contact night-coverage at [email or web address], password [facility] 01/27/2014, 10:05 AM  LOS: 1 day

## 2014-01-27 NOTE — Progress Notes (Signed)
Dr. Karleen Hampshire called unit to inquire about pt's recent hypoglycemic event. See new orders received.

## 2014-01-27 NOTE — Progress Notes (Signed)
[facility] Progress Note  Subjective:  Is feeling better today after receiving some IV hydration.  Erythromycin was started yesterday.  Has not taken her domperidone at home in a while and does not think that she has ever taken Reglan.  Received a dose of Senokot yesterday but still no BM.  Phenergan is helping with the nausea.  Objective:  Vital signs in last 24 hours: Temp:  [97.7 F (36.5 C)-98 F (36.7 C)] 98 F (36.7 C) (05/30 0530) Pulse Rate:  [55-98] 55 (05/30 0530) Resp:  [16-20] 16 (05/30 0530) BP: (93-124)/(49-78) 93/49 mmHg (05/30 0530) SpO2:  [98 %-100 %] 98 % (05/30 0530) Weight:  [110 lb (49.896 kg)] 110 lb (49.896 kg) (05/29 1615) Last BM Date: 01/23/14 General:  Alert, Well-developed, in NAD Heart:  Bradycardic but regular rhythm; no murmurs Pulm:  CTAB.  No W/R/R. Abdomen:  Soft, non-distended. Normal bowel sounds.  Left sided TTP without R/R/G.  Insulin pump noted LLQ. Extremities:  Without edema. Neurologic:  Alert and  oriented x4;  grossly normal neurologically. Psych:  Alert and cooperative. Normal mood and affect.  Intake/Output from previous day: 05/29 0701 - 05/30 0700 In: 240 [P.O.:240] Out: -   Lab Results:  Recent Labs  01/26/14 1720  WBC 8.9  HGB 14.6  HCT 42.5  PLT 339   BMET  Recent Labs  01/26/14 1720  NA 136*  K 3.9  CL 101  CO2 22  GLUCOSE 164*  BUN 5*  CREATININE 0.55  CALCIUM 9.7   LFT  Recent Labs  01/26/14 1720  PROT 6.9  ALBUMIN 3.9  AST 16  ALT 16  ALKPHOS 104  BILITOT 0.6   PT/INR  Recent Labs  01/26/14 1720  LABPROT 13.4  INR 1.04   Dg Chest 2 View  01/26/2014   CLINICAL DATA:  Productive cough, smoker  EXAM: CHEST  2 VIEW  COMPARISON:  04/24/2013  FINDINGS: The heart size and mediastinal contours are within normal limits. Both lungs are clear. The visualized skeletal structures are unremarkable.  IMPRESSION: No active cardiopulmonary disease.   Electronically Signed   By: Kathreen Devoid   On: 01/26/2014 18:34   Dg Abd 2 Views  01/26/2014   CLINICAL DATA:  Abdominal pain  EXAM: ABDOMEN - 2 VIEW  COMPARISON:  05/11/2012  FINDINGS: Scattered large and small bowel gas is noted. No free air is seen. Mild fecal material is noted within the colon. No abnormal mass or abnormal calcifications are seen. No bony abnormality is noted.  IMPRESSION: No acute abnormality seen.   Electronically Signed   By: Inez Catalina M.D.   On: 01/26/2014 18:35    Assessment / Plan: 1. Intractable nausea vomiting/dehydration/diabetic gastroparesis --Continue IV hydration.  Needs well-controlled glucose management.  Continue anti-emetics (phenergan) prn, and IV erythromycin 150 mg TID to help improve gastric motility for now.  Could consider repeat EGD to exclude other causes of ongoing nausea and vomiting if she does not continue to improve.  2. Constipation -- not normally a problem but likely as a result of dehydration. No BM after receiving Senokot.  Will give dulcolax suppository x 1, and it looks like daily Miralax has been ordered as well.  3. Diabetes type 1 -- glucose management per hospital team     LOS: 1 day   Laban Emperor. Zehr  01/27/2014, 9:05 AM  Pager number [phone]   GI ATTENDING  Interval history and data reviewed. Patient personally seen and examined.  Agree with H&P as above. Feels much better today. Hungry and wants her diet advanced. Only issue is left lower quadrant discomfort. She is tender superficially but no obvious infection) she has had before. Needs to be watched closely. May need CT scan if still uncomfortable tomorrow. We'll follow  Docia Chuck. Geri Seminole., M.D. [facility] Division of Gastroenterology

## 2014-01-27 NOTE — Progress Notes (Signed)
Hypoglycemic Event  CBG: 52 @ 1535  Treatment: 15 GM carbohydrate snack  Symptoms: Pale and Sweaty  Follow-up CBG: Time:1554 CBG Result:107  Possible Reasons for Event: Recently covered carb snack with insulin pump as recorded on form at bedisde  Comments/MD notified:Dr. Karleen Hampshire notified via text of event at 1406. Pt feeling better. No s/s of hypoglycemia.    Karen Hayden  Remember to initiate Hypoglycemia Order Set & complete

## 2014-01-28 DIAGNOSIS — K3184 Gastroparesis: Secondary | ICD-10-CM | POA: Diagnosis not present

## 2014-01-28 DIAGNOSIS — R112 Nausea with vomiting, unspecified: Secondary | ICD-10-CM | POA: Diagnosis not present

## 2014-01-28 DIAGNOSIS — E1149 Type 2 diabetes mellitus with other diabetic neurological complication: Secondary | ICD-10-CM | POA: Diagnosis not present

## 2014-01-28 LAB — GLUCOSE, CAPILLARY
GLUCOSE-CAPILLARY: 107 mg/dL — AB (ref 70–99)
GLUCOSE-CAPILLARY: 162 mg/dL — AB (ref 70–99)
GLUCOSE-CAPILLARY: 53 mg/dL — AB (ref 70–99)
GLUCOSE-CAPILLARY: 65 mg/dL — AB (ref 70–99)
Glucose-Capillary: 171 mg/dL — ABNORMAL HIGH (ref 70–99)
Glucose-Capillary: 97 mg/dL (ref 70–99)
Glucose-Capillary: 340 mg/dL — ABNORMAL HIGH (ref 70–99)
Glucose-Capillary: 315 mg/dL — ABNORMAL HIGH (ref 70–99)
Glucose-Capillary: 174 mg/dL — ABNORMAL HIGH (ref 70–99)

## 2014-01-28 MED ORDER — INSULIN PUMP
SUBCUTANEOUS | Status: DC
  Administered 2014-01-28: 16:00:00 via SUBCUTANEOUS
  Filled 2014-01-28: qty 1

## 2014-01-28 MED ORDER — INSULIN PUMP
Freq: Three times a day (TID) | SUBCUTANEOUS | Status: DC
  Administered 2014-01-28: 13:00:00 via SUBCUTANEOUS
  Filled 2014-01-28: qty 1

## 2014-01-28 MED ORDER — INSULIN PUMP
SUBCUTANEOUS | Status: DC
  Filled 2014-01-28: qty 1

## 2014-01-28 MED ORDER — INSULIN ASPART 100 UNIT/ML ~~LOC~~ SOLN: 0.0000 [IU] | Freq: Three times a day (TID) | SUBCUTANEOUS | Status: DC

## 2014-01-28 MED ORDER — POLYETHYLENE GLYCOL 3350 17 G PO PACK
17.0000 g | PACK | Freq: Every day | ORAL | Status: DC | PRN
  Filled 2014-01-28: qty 1

## 2014-01-28 MED ORDER — SENNA 8.6 MG PO TABS: 1.0000 | ORAL_TABLET | Freq: Every day | ORAL | Status: DC | PRN

## 2014-01-28 NOTE — Progress Notes (Signed)
[facility] Progress Note  Subjective:  Feels much better today.  Tolerated solid food for breakfast.  Had several bowel movements yesterday and abdominal pain is much improved.  Blood sugars keep running low so they are taking her off of the insulin pump.  Wants a phenergan prescription upon discharge.   Objective:  Vital signs in last 24 hours: Temp:  [97.7 F (36.5 C)-98.2 F (36.8 C)] 97.7 F (36.5 C) (05/31 0516) Pulse Rate:  [61-67] 65 (05/31 0516) Resp:  [16-18] 18 (05/31 0516) BP: (100-117)/(69-79) 100/69 mmHg (05/31 0516) SpO2:  [97 %-99 %] 99 % (05/31 0516) Last BM Date: 01/27/14 General:  Alert, thin, in NAD Heart:  Regular rate and rhythm; no murmurs Pulm:  CTAB.  No W/R/R. Abdomen:  Soft, non-distended. Normal bowel sounds.  Non-tender. Extremities:  Without edema. Neurologic:  Alert and  oriented x4;  grossly normal neurologically. Psych:  Alert and cooperative. Normal mood and affect.  Intake/Output from previous day: 05/30 0701 - 05/31 0700 In: 5208.3 [P.O.:720; I.V.:3988.3; IV Piggyback:500] Out: 650 [Urine:650] Intake/Output this shift: Total I/O In: 240 [P.O.:240] Out: -   Lab Results:  Recent Labs  01/26/14 1720  WBC 8.9  HGB 14.6  HCT 42.5  PLT 339   BMET  Recent Labs  01/26/14 1720  NA 136*  K 3.9  CL 101  CO2 22  GLUCOSE 164*  BUN 5*  CREATININE 0.55  CALCIUM 9.7   LFT  Recent Labs  01/26/14 1720  PROT 6.9  ALBUMIN 3.9  AST 16  ALT 16  ALKPHOS 104  BILITOT 0.6   PT/INR  Recent Labs  01/26/14 1720  LABPROT 13.4  INR 1.04   Dg Chest 2 View  01/26/2014   CLINICAL DATA:  Productive cough, smoker  EXAM: CHEST  2 VIEW  COMPARISON:  04/24/2013  FINDINGS: The heart size and mediastinal contours are within normal limits. Both lungs are clear. The visualized skeletal structures are unremarkable.  IMPRESSION: No active cardiopulmonary disease.   Electronically Signed   By: Kathreen Devoid   On: 01/26/2014 18:34    Dg Abd 2 Views  01/26/2014   CLINICAL DATA:  Abdominal pain  EXAM: ABDOMEN - 2 VIEW  COMPARISON:  05/11/2012  FINDINGS: Scattered large and small bowel gas is noted. No free air is seen. Mild fecal material is noted within the colon. No abnormal mass or abnormal calcifications are seen. No bony abnormality is noted.  IMPRESSION: No acute abnormality seen.   Electronically Signed   By: Inez Catalina M.D.   On: 01/26/2014 18:35    Assessment / Plan: 1. Intractable nausea vomiting/dehydration/diabetic gastroparesis --Needs well-controlled glucose management. Continue anti-emetics (phenergan) prn and she can be discharged with this.  Continue IV erythromycin 150 mg TID to help improve gastric motility until discharge (Dr. Henrene Pastor will leave recommendation regarding PO dosing/course). 2. Constipation -- had several bowel movements after bowel regimen yesterday. 3. Diabetes type 1 -- glucose management per hospital team   *Follow-up with Dr. Hilarie Fredrickson in 2-4 weeks to discuss further long term medication management for her gastroparesis.    LOS: 2 days   Laban Emperor. Zehr  01/28/2014, 9:05 AM  Pager number [phone]  GI ATTENDING  Interval history and data reviewed. Patient personally seen and examined. Agree with above. Doing much better. Tolerating diet without nausea and vomiting. Blood sugars erratic. From a GI standpoint, continue IV erythromycin until discharge. No need to send out on by mouth erythromycin, much less  effective if at all. She would like Phenergan prescription at discharge which I agree with. She can follow up with Dr. Hilarie Fredrickson in the office in 2-4 weeks regarding ongoing management of her gastroparesis. Will sign off. Call for questions or problems. Thank you  Docia Chuck. Geri Seminole., M.D. [facility] Division of Gastroenterology

## 2014-01-28 NOTE — Progress Notes (Signed)
Pt feeling much better since hypoglycemic event.

## 2014-01-28 NOTE — Progress Notes (Signed)
CARE MANAGEMENT NOTE 01/28/2014  Patient:  Karen Hayden, Karen Hayden   Account Number:  [id number]  Date Initiated:  01/28/2014  Documentation initiated by:  [facility]  Subjective/Objective Assessment:   DM Type I     Action/Plan:   medication assistance   Anticipated DC Date:  01/29/2014   Anticipated DC Plan:  [place]  CM consult  Medication Assistance      Choice offered to / List presented to:             Status of service:  Completed, signed off Medicare Important Message given?  YES (If response is "NO", the following Medicare IM given date fields will be blank) Date Medicare IM given:  01/26/2014 Date Additional Medicare IM given:    Discharge Disposition:  HOME/SELF CARE  Per UR Regulation:    If discussed at Long Length of Stay Meetings, dates discussed:    Comments:  01/28/2014 1800 NCM spoke to pt and states she has Rx coverage but she has a expensive deductible. Her supplies for her pump is $100 every three months. She is able to afford her thyroid medication. She has difficulty paying for test strips and insulin. Her physician does provide samples our from the office. Explained no assistance program for copay amounts. Pt is not eligible for MATCH program. Jonnie Finner RN CCM Case Mgmt phone [phone]

## 2014-01-28 NOTE — Progress Notes (Signed)
Hypoglycemic Event  CBG: 65  Treatment: 15 GM carbohydrate snack  Symptoms: None  Follow-up CBG: Time: 0030 CBG Result: 107  Possible Reasons for Event: Medication regimen: Insulin pump  Comments/MD notified: [facility]    [name]  Remember to initiate Hypoglycemia Order Set & complete

## 2014-01-28 NOTE — Progress Notes (Signed)
Dr. Karleen Hampshire aware via phone pt's CBG at 1150 was 350 and pt corrected with insulin pump. One hour later CBG rechecked with hospital monitor per order and found at 315. No new orders received. Pt asked not to correct per Md.

## 2014-01-28 NOTE — Progress Notes (Signed)
Dr. Karleen Hampshire in to see pt. Md aware pt increased to carb modified diet this am per order and tolerating well with no pain noted. Recent blood glucose per pt's monitor 228. See Dr. Karleen Hampshire instructed pt to restart insulin pump at current settings. See new orders in Epic.

## 2014-01-28 NOTE — Progress Notes (Signed)
INITIAL NUTRITION ASSESSMENT  DOCUMENTATION CODES Per approved criteria  -Severe malnutrition in the context of chronic illness   INTERVENTION:  Medical illustrator between meals  Educated on diet for Gastroparesis, decreased fat, decreased fiber, change of meat as needed.  Teach back method used and patient able to verbalize.  [place] provided  Encouraged intake of small frequent meals with choices of low fat, low fiber foods.  RD to monitor.  NUTRITION DIAGNOSIS: Inadequate oral intake related to altered gi function as evidenced by diet hx and current intake and weight loss prior to admit.  Goal: Intake of >75% meals and snacks.  Monitor:  Intake, labs, weight trend  Reason for Assessment: consult for poor po intake and MST  38 y.o. female  Admitting Dx: <principal problem not specified>  ASSESSMENT: Patient with h/o type 1 DM on insulin pump, gastroparesis admitted with worsening N/V.  Patient has lost 20 lbs since December.  Currently patient has had several bowel movements and now is tolerating small meals well.  Patient reports that she had been having problems with increased N/V since anti nausea medicine was changed in October from Phenergan to Compazine.  States she would vomit partially digested food with vomiting worse in the morning. States her HgbA1C was 8.2 3 weeks ago which was increased from last check.  Has used [employer].  Counts CHO and doses insulin based on blood sugar and CHO intake.  Currently pump is off secondary to low blood sugar.  Patient meets criteria for severe malnutrition related to chronic illness AEB weight loss of 16% in the past 5-6 months, intake <75% for > 1 month, and decreased muscle mass and body fat.  Nutrition Focused Physical Exam:  Subcutaneous Fat:  Orbital Region: wnl Upper Arm Region: mild/moderate Thoracic and Lumbar Region: wnl  Muscle:  Temple Region: mild/moderate Clavicle Bone Region:  mild Clavicle and Acromion Bone Region: wnl Scapular Bone Region: mild Dorsal Hand: mild Patellar Region: mild Anterior Thigh Region: mild Posterior Calf Region: mild  Edema: not noted    Height: Ht Readings from Last 1 Encounters:  01/26/14 5\' 1"  (1.549 m)    Weight: Wt Readings from Last 1 Encounters:  01/26/14 110 lb (49.896 kg)    Ideal Body Weight: 105 lbs  % Ideal Body Weight: 105  Wt Readings from Last 10 Encounters:  01/26/14 110 lb (49.896 kg)  01/26/14 110 lb (49.896 kg)  08/11/13 131 lb (59.421 kg)  07/21/13 131 lb 4 oz (59.535 kg)  06/20/13 130 lb (58.968 kg)  04/24/13 134 lb (60.782 kg)  09/09/12 147 lb (66.679 kg)  04/20/12 139 lb 3.2 oz (63.141 kg)  01/20/12 130 lb 6.4 oz (59.149 kg)  06/04/11 129 lb 6.4 oz (58.695 kg)    Usual Body Weight: 130 lbs 5-6 months ago  % Usual Body Weight: 84  BMI:  Body mass index is 20.8 kg/(m^2).  Estimated Nutritional Needs: Kcal: 1600-1800 Protein: 50-60 Fluid: 1.6L  Skin: wnl  Diet Order: Carb Control  EDUCATION NEEDS: -Education needs addressed   Intake/Output Summary (Last 24 hours) at 01/28/14 1417 Last data filed at 01/28/14 0857  Gross per 24 hour  Intake   2820 ml  Output      0 ml  Net   2820 ml     Labs:   Recent Labs Lab 01/26/14 1720  NA 136*  K 3.9  CL 101  CO2 22  BUN 5*  CREATININE 0.55  CALCIUM 9.7  MG 1.8  PHOS 3.0  GLUCOSE 164*    CBG (last 3)   Recent Labs  01/28/14 0816 01/28/14 1148 01/28/14 1253  GLUCAP 97 340* 315*    Scheduled Meds: . enoxaparin (LOVENOX) injection  40 mg Subcutaneous Q24H  . erythromycin  150 mg Intravenous 3 times per day  . insulin pump   Subcutaneous 6 times per day  . levothyroxine  100 mcg Oral QAC breakfast  . nicotine  21 mg Transdermal Daily    Continuous Infusions:   Past Medical History  Diagnosis Date  . DM (diabetes mellitus)   . Diabetic gastroparesis   . Seizure disorder   . Hypertension   . Umbilical  hernia   . Hyperlipidemia   . Thyroid disease   . Gastroparesis     Due to diabetes   . Bipolar 1 disorder     Past Surgical History  Procedure Laterality Date  . Vaginal hysterectomy    . Tonsillectomy      Antonieta Iba, RD, LDN Clinical Inpatient Dietitian Pager:  [phone] Weekend and after hours pager:  [phone]

## 2014-01-28 NOTE — Progress Notes (Signed)
Dr. Karleen Hampshire aware of recent hypoglycemic event. See new orders received to discontinue insulin pump. Insulin pump currently suspended per order. Pt notified to leave pump off with verbalized understanding.

## 2014-01-28 NOTE — Progress Notes (Signed)
[facility] PROGRESS NOTE  Karen Hayden NAT:[id number] DOB: [birth year] DOA: 01/26/2014 PCP: Jacelyn Pi, MD Interim summary: Karen Hayden is a 38 y.o. female with prior h/o type ! DM, gastroparesis, reports worsening nausea, vomiting and left lower quadrant abdominal pain associated with constipation. Her last BM was 3 days ago. She denies fever s or chills, sob, chest pain, palpitations. She reports productive cough. She was seen in Dr Hilarie Fredrickson office and referred to medical service for admission directly, for supportive care for dehydration. Pt also reports weight loss of more than 20 lbs since decemeber. She reports her weight to be 130 in December 2014 and its 110 today. She will be admitted to med surg bed to [facility] Service.   Assessment/Plan: Persistent nausea and vomiting :  Resolved on erythromycin.  - possibly worsening of her gastroparesis.  - admittd to medsurg, started her on IV fluids for dehydration, .  -  labs ordered and within normal limits.  - IV erythromycin, and IV phenergan to improve motility and nausea. She was able to keep fluids in ,  advanced diet no vomiting.-   Diabetes Mellitus: she reports her hgba1c is 8.2, worsened .  - she will use insulin pump and insulin pump order set on board.    Abdominal pain:  Llq. abd film does not show constipation. Laxatives ordered. And she had multiple bowel movements int he last 24 hours.  Continue to monitor.   Weight loss and failure to thrive:  - possibly from the worsening gastroparesis and decreased intake of nutrition.  - nutrition consult.  - if abd pain does nt' improve will get CT abd and pelvis.   DVT prophylaxis.  Code Status: full code  Family Communication: family at bedside.   Disposition Plan: admit to medsurg.     Consultants:  GI  Procedures:  none  Antibiotics:  Erythromycin.   HPI/Subjective: An episode of hypoglycemia around this afternoon.    Objective: Filed Vitals:   01/28/14  1308  BP: 109/72  Pulse: 68  Temp: 98.2 F (36.8 C)  Resp: 16    Intake/Output Summary (Last 24 hours) at 01/28/14 1404 Last data filed at 01/28/14 0857  Gross per 24 hour  Intake   2820 ml  Output      0 ml  Net   2820 ml   Filed Weights   01/26/14 1615  Weight: 49.896 kg (110 lb)    Exam:   General:  Alert afebrile comfortable  Cardiovascular: s1s2  Respiratory: ctab  Abdomen: soft no pain in the LLQ  Musculoskeletal: no pedal edema  Data Reviewed: Basic Metabolic Panel:  Recent Labs Lab 01/26/14 1720  NA 136*  K 3.9  CL 101  CO2 22  GLUCOSE 164*  BUN 5*  CREATININE 0.55  CALCIUM 9.7  MG 1.8  PHOS 3.0   Liver Function Tests:  Recent Labs Lab 01/26/14 1720  AST 16  ALT 16  ALKPHOS 104  BILITOT 0.6  PROT 6.9  ALBUMIN 3.9   No results found for this basename: LIPASE, AMYLASE,  in the last 168 hours No results found for this basename: AMMONIA,  in the last 168 hours CBC:  Recent Labs Lab 01/26/14 1720  WBC 8.9  NEUTROABS 5.9  HGB 14.6  HCT 42.5  MCV 81.6  PLT 339   Cardiac Enzymes: No results found for this basename: CKTOTAL, CKMB, CKMBINDEX, TROPONINI,  in the last 168 hours BNP (last 3 results) No results found for this basename: PROBNP,  in the last 8760 hours CBG:  Recent Labs Lab 01/28/14 0212 01/28/14 0755 01/28/14 0816 01/28/14 1148 01/28/14 1253  GLUCAP 171* 53* 97 340* 315*    No results found for this or any previous visit (from the past 240 hour(s)).   Studies: Dg Chest 2 View  01/26/2014   CLINICAL DATA:  Productive cough, smoker  EXAM: CHEST  2 VIEW  COMPARISON:  04/24/2013  FINDINGS: The heart size and mediastinal contours are within normal limits. Both lungs are clear. The visualized skeletal structures are unremarkable.  IMPRESSION: No active cardiopulmonary disease.   Electronically Signed   By: Kathreen Devoid   On: 01/26/2014 18:34   Dg Abd 2 Views  01/26/2014   CLINICAL DATA:  Abdominal pain  EXAM:  ABDOMEN - 2 VIEW  COMPARISON:  05/11/2012  FINDINGS: Scattered large and small bowel gas is noted. No free air is seen. Mild fecal material is noted within the colon. No abnormal mass or abnormal calcifications are seen. No bony abnormality is noted.  IMPRESSION: No acute abnormality seen.   Electronically Signed   By: Inez Catalina M.D.   On: 01/26/2014 18:35    Scheduled Meds: . enoxaparin (LOVENOX) injection  40 mg Subcutaneous Q24H  . erythromycin  150 mg Intravenous 3 times per day  . insulin pump   Subcutaneous 6 times per day  . levothyroxine  100 mcg Oral QAC breakfast  . nicotine  21 mg Transdermal Daily   Continuous Infusions:    Active Problems:   DIABETES MELLITUS, TYPE I   SEIZURE DISORDER   Diabetic gastroparesis   Nausea with vomiting   Nausea & vomiting   Unspecified constipation   Cough   Tobacco use disorder    Time spent: 42minutes    Hosie Poisson  [facility] Pager [phone] If 7PM-7AM, please contact night-coverage at [email or web address], password [facility] 01/28/2014, 2:04 PM  LOS: 2 days

## 2014-01-28 NOTE — Progress Notes (Signed)
Hypoglycemic Event  CBG: 53 @0800   Treatment: 15 GM carbohydrate snack  Symptoms: Sweaty and Shaky  Follow-up CBG: [id number] CBG Result:97  Possible Reasons for Event: Unknown  Comments/MD notified:Dr. Karleen Hampshire paged    Derek Jack  Remember to initiate Hypoglycemia Order Set & complete

## 2014-01-29 DIAGNOSIS — E43 Unspecified severe protein-calorie malnutrition: Secondary | ICD-10-CM | POA: Insufficient documentation

## 2014-01-29 DIAGNOSIS — R112 Nausea with vomiting, unspecified: Secondary | ICD-10-CM | POA: Diagnosis not present

## 2014-01-29 HISTORY — DX: Unspecified severe protein-calorie malnutrition: E43

## 2014-01-29 LAB — GLUCOSE, CAPILLARY
GLUCOSE-CAPILLARY: 131 mg/dL — AB (ref 70–99)
GLUCOSE-CAPILLARY: 148 mg/dL — AB (ref 70–99)
GLUCOSE-CAPILLARY: 90 mg/dL (ref 70–99)

## 2014-01-29 MED ORDER — PROMETHAZINE HCL 12.5 MG PO TABS: 12.5000 mg | ORAL_TABLET | Freq: Four times a day (QID) | ORAL | Status: DC | PRN

## 2014-01-30 DIAGNOSIS — E7800 Pure hypercholesterolemia, unspecified: Secondary | ICD-10-CM | POA: Diagnosis not present

## 2014-01-30 DIAGNOSIS — E109 Type 1 diabetes mellitus without complications: Secondary | ICD-10-CM | POA: Diagnosis not present

## 2014-01-30 DIAGNOSIS — E039 Hypothyroidism, unspecified: Secondary | ICD-10-CM | POA: Diagnosis not present

## 2014-01-30 DIAGNOSIS — G609 Hereditary and idiopathic neuropathy, unspecified: Secondary | ICD-10-CM | POA: Diagnosis not present

## 2014-02-03 NOTE — Discharge Summary (Signed)
Physician Discharge Summary  Karen Hayden PRF:[id number] DOB: 02/13/1976 DOA: 01/26/2014  PCP: Jacelyn Pi, MD  Admit date: 01/26/2014 Discharge date: 01/29/2014  Time spent: 30 minutes  Recommendations for Outpatient Follow-up:  1. Follow up with PCP in one week.   Discharge Diagnoses:  Active Problems:   DIABETES MELLITUS, TYPE I   SEIZURE DISORDER   Diabetic gastroparesis   Nausea with vomiting   Nausea & vomiting   Unspecified constipation   Cough   Tobacco use disorder   Protein-calorie malnutrition, severe   Discharge Condition: improved  Diet recommendation: crab modified diet.   Filed Weights   01/26/14 1615  Weight: 49.896 kg (110 lb)    History of present illness:  Karen Hayden is a 38 y.o. female with prior h/o type ! DM, gastroparesis, reports worsening nausea, vomiting and left lower quadrant abdominal pain associated with constipation. Her last BM was 3 days ago. She denies fever s or chills, sob, chest pain, palpitations. She reports productive cough. She was seen in Dr Hilarie Fredrickson office and referred to medical service for admission directly, for supportive care for dehydration. Pt also reports weight loss of more than 20 lbs since decemeber. She reports her weight to be 130 in December 2014 and its 110 today. She will be admitted to med surg bed to [facility].    Hospital Course:  Persistent nausea and vomiting :   possibly worsening of her gastroparesis. She was admittd to [facility], started her on IV fluids for dehydration, .Labs ordered and within normal limits. She was started  IV erythromycin, and IV phenergan to improve motility and nausea. She was able to keep fluids in , advanced diet  Without any symptoms of nausea and  Vomiting. Later on she was discharged on po phenergan.  Diabetes Mellitus:  she reports her hgba1c is 8.2, worsened .  - she will use insulin pump and insulin pump order set on board.  Abdominal pain:  Llq. abd film does not show  constipation.  Laxatives ordered. And she had multiple bowel movements int he last 24 hours. Laxatives were stopped. Weight loss and failure to thrive:  - possibly from the worsening gastroparesis and decreased intake of nutrition.  - nutrition consult. And recommendations given.    Procedures:  none  Consultations:  gastroenterology  Discharge Exam: Filed Vitals:   01/29/14 0830  BP: 110/49  Pulse:   Temp: 98.2 F (36.8 C)  Resp: 18    General: alert afebrile comfortable Cardiovascular: s1s2 Respiratory: ctab  Discharge Instructions You were cared for by a hospitalist during your hospital stay. If you have any questions about your discharge medications or the care you received while you were in the hospital after you are discharged, you can call the unit and asked to speak with the hospitalist on call if the hospitalist that took care of you is not available. Once you are discharged, your primary care physician will handle any further medical issues. Please note that NO REFILLS for any discharge medications will be authorized once you are discharged, as it is imperative that you return to your primary care physician (or establish a relationship with a primary care physician if you do not have one) for your aftercare needs so that they can reassess your need for medications and monitor your lab values.  Discharge Instructions   Discharge instructions    Complete by:  As directed   Follow up with PCP in one week.  Follow up with Dr Chalmers Cater by  the end of the week Follow up with Dr Hilarie Fredrickson in 2 to 4 weeks.            Medication List         ezetimibe 10 MG tablet  Commonly known as:  ZETIA  Take 10 mg by mouth daily.     insulin pump Soln  Inject into the skin continuous. humalog     levothyroxine 100 MCG tablet  Commonly known as:  SYNTHROID, LEVOTHROID  Take 100 mcg by mouth daily.     prochlorperazine 5 MG tablet  Commonly known as:  COMPAZINE  Take 5 mg by  mouth every 6 (six) hours as needed for nausea or vomiting.     promethazine 12.5 MG tablet  Commonly known as:  PHENERGAN  Take 1 tablet (12.5 mg total) by mouth every 6 (six) hours as needed for nausea or vomiting.     saccharomyces boulardii 250 MG capsule  Commonly known as:  FLORASTOR  Take 1 capsule (250 mg total) by mouth 2 (two) times daily.       Allergies  Allergen Reactions  . Lipitor [Atorvastatin]     Attacks joints, inflamation  . Codeine Rash      The results of significant diagnostics from this hospitalization (including imaging, microbiology, ancillary and laboratory) are listed below for reference.    Significant Diagnostic Studies: Dg Chest 2 View  01/26/2014   CLINICAL DATA:  Productive cough, smoker  EXAM: CHEST  2 VIEW  COMPARISON:  04/24/2013  FINDINGS: The heart size and mediastinal contours are within normal limits. Both lungs are clear. The visualized skeletal structures are unremarkable.  IMPRESSION: No active cardiopulmonary disease.   Electronically Signed   By: Kathreen Devoid   On: 01/26/2014 18:34   Dg Abd 2 Views  01/26/2014   CLINICAL DATA:  Abdominal pain  EXAM: ABDOMEN - 2 VIEW  COMPARISON:  05/11/2012  FINDINGS: Scattered large and small bowel gas is noted. No free air is seen. Mild fecal material is noted within the colon. No abnormal mass or abnormal calcifications are seen. No bony abnormality is noted.  IMPRESSION: No acute abnormality seen.   Electronically Signed   By: Inez Catalina M.D.   On: 01/26/2014 18:35    Microbiology: No results found for this or any previous visit (from the past 240 hour(s)).   Labs: Basic Metabolic Panel: No results found for this basename: NA, K, CL, CO2, GLUCOSE, BUN, CREATININE, CALCIUM, MG, PHOS,  in the last 168 hours Liver Function Tests: No results found for this basename: AST, ALT, ALKPHOS, BILITOT, PROT, ALBUMIN,  in the last 168 hours No results found for this basename: LIPASE, AMYLASE,  in the last  168 hours No results found for this basename: AMMONIA,  in the last 168 hours CBC: No results found for this basename: WBC, NEUTROABS, HGB, HCT, MCV, PLT,  in the last 168 hours Cardiac Enzymes: No results found for this basename: CKTOTAL, CKMB, CKMBINDEX, TROPONINI,  in the last 168 hours BNP: BNP (last 3 results) No results found for this basename: PROBNP,  in the last 8760 hours CBG:  Recent Labs Lab 01/28/14 1608 01/28/14 2007 01/29/14 0003 01/29/14 0352 01/29/14 0735  GLUCAP 174* 162* 148* 90 131*       Signed:  Hosie Poisson  [facility] 01/29/2014, 8:30 PM

## 2014-02-08 ENCOUNTER — Telehealth: Payer: Self-pay | Admitting: Internal Medicine

## 2014-02-08 MED ORDER — PROMETHAZINE HCL 25 MG RE SUPP: 25.0000 mg | Freq: Four times a day (QID) | RECTAL | Status: DC | PRN

## 2014-02-08 NOTE — Telephone Encounter (Signed)
Left message for patient to call back  

## 2014-02-08 NOTE — Telephone Encounter (Signed)
Yes Phenergan suppositories 25 mg every 6 hours If vomiting continues she needs to go back to the hospital/ER her IV fluids and glucose management She needs to notify her endocrinologist of ongoing erratic sugars leading to intractable nausea vomiting, dehydration, etc.

## 2014-02-08 NOTE — Telephone Encounter (Signed)
Patient reports that she has intractable vomiting since yesterday am.  She is not able to keep phenergan nor compazine down.  She states no BM since Tuesday.  Do you want to try sending in suppositories or ER???

## 2014-02-08 NOTE — Telephone Encounter (Signed)
I have left a detailed message for the patient that rx is at the pharmacy and instructions to go to ER for continued vomiting.  She is asked to call back for any additional questions or concerns.

## 2014-02-28 ENCOUNTER — Encounter: Payer: Self-pay | Admitting: Internal Medicine

## 2014-03-06 ENCOUNTER — Ambulatory Visit (INDEPENDENT_AMBULATORY_CARE_PROVIDER_SITE_OTHER): Payer: Medicare Other | Admitting: Internal Medicine

## 2014-03-06 ENCOUNTER — Encounter: Payer: Self-pay | Admitting: Internal Medicine

## 2014-03-06 VITALS — BP 100/74 | HR 72 | Ht 61.0 in | Wt 107.0 lb

## 2014-03-06 DIAGNOSIS — R630 Anorexia: Secondary | ICD-10-CM

## 2014-03-06 DIAGNOSIS — R634 Abnormal weight loss: Secondary | ICD-10-CM

## 2014-03-06 DIAGNOSIS — E109 Type 1 diabetes mellitus without complications: Secondary | ICD-10-CM

## 2014-03-06 DIAGNOSIS — K3184 Gastroparesis: Secondary | ICD-10-CM | POA: Diagnosis not present

## 2014-03-06 DIAGNOSIS — R112 Nausea with vomiting, unspecified: Secondary | ICD-10-CM

## 2014-03-06 MED ORDER — DRONABINOL 2.5 MG PO CAPS: 2.5000 mg | ORAL_CAPSULE | Freq: Two times a day (BID) | ORAL | Status: DC

## 2014-03-06 MED ORDER — PROMETHAZINE HCL 12.5 MG PO TABS
25.0000 mg | ORAL_TABLET | Freq: Four times a day (QID) | ORAL | Status: DC | PRN
Start: 2014-03-06 — End: 2014-05-02

## 2014-03-06 NOTE — Patient Instructions (Addendum)
You have been given a separate informational sheet regarding your tobacco use, the importance of quitting and local resources to help you quit.  We have sent the following medications to your pharmacy for you to pick up at your convenience: marinol, 2.5 mg twice a day Phenergan 25 mg daily  You have a follow up with Dr. Hilarie Fredrickson in office on 05/02/2014 @ 2:45pm

## 2014-03-06 NOTE — Progress Notes (Signed)
Subjective:    Patient ID: Karen Hayden, female    DOB: December 14, 1975, 38 y.o.   MRN: [id number]  HPI Karen Hayden is a 38 year old female with a past medical history of brittle type 1 diabetes with insulin pump, gastroparesis secondary to diabetes, hypertension, hyperlipidemia, seizure disorder who is here for followup. She was last seen in the office on 01/26/2014 at which point she was having severe symptoms necessitating hospital admission. She was started on IV erythromycin, rehydrated and her glucoses were better controlled. Her nausea and vomiting improved fairly precipitously and she was discharged home. Also at that time she was having some constipation which she reports now has completely resolved. Today she is feeling better, she is worried about her inability to gain weight. She reports her sugars are still quite erratic with lows and highs. Fortunately her nausea is better and she is eating small more frequent meals. She is also avoiding fatty meals and meats.  She has not had any further vomiting in several weeks. Occasionally mild nausea which is responding well to promethazine. She estimates she is using promethazine 2-3 days per week. The 25 mg dose does work better for her. She denies abdominal pain today. Overall appetite is still somewhat decreased. She reports normal bowel movements without blood or melena.  She was to see taking domperidone but this was not continued at hospital discharge  Review of Systems As per history of present illness, otherwise negative  Current Medications, Allergies, Past Medical History, Past Surgical History, Family History and Social History were reviewed in [employer] record.     Objective:   Physical Exam BP 100/74  Pulse 72  Ht 5\' 1"  (1.549 m)  Wt 107 lb (48.535 kg)  BMI 20.23 kg/m2 Constitutional: Chronically ill female in no acute distress, thin, very pleasant  HEENT: Normocephalic and atraumatic. Oropharynx is clear  and moist. No oropharyngeal exudate. Conjunctivae are normal.  No scleral icterus. Neck: Neck supple. Trachea midline. Cardiovascular: Normal rate, regular rhythm and intact distal pulses. No M/R/G Pulmonary/chest: Effort normal and breath sounds normal. No wheezing, rales or rhonchi. Abdominal: Soft, nontender, nondistended. Bowel sounds active throughout. Insulin pump in place Extremities: no clubbing, cyanosis, or edema Neurological: Alert and oriented to person place and time. Skin: Skin is warm and dry. No rashes noted. Multiple tattoos Psychiatric: Normal mood and affect. Behavior is normal.  CBC    Component Value Date/Time   WBC 8.9 01/26/2014 1720   RBC 5.21* 01/26/2014 1720   HGB 14.6 01/26/2014 1720   HCT 42.5 01/26/2014 1720   PLT 339 01/26/2014 1720   MCV 81.6 01/26/2014 1720   MCH 28.0 01/26/2014 1720   MCHC 34.4 01/26/2014 1720   RDW 13.1 01/26/2014 1720   LYMPHSABS 2.3 01/26/2014 1720   MONOABS 0.6 01/26/2014 1720   EOSABS 0.1 01/26/2014 1720   BASOSABS 0.0 01/26/2014 1720    CMP     Component Value Date/Time   NA 136* 01/26/2014 1720   K 3.9 01/26/2014 1720   CL 101 01/26/2014 1720   CO2 22 01/26/2014 1720   GLUCOSE 164* 01/26/2014 1720   BUN 5* 01/26/2014 1720   CREATININE 0.55 01/26/2014 1720   CALCIUM 9.7 01/26/2014 1720   PROT 6.9 01/26/2014 1720   ALBUMIN 3.9 01/26/2014 1720   AST 16 01/26/2014 1720   ALT 16 01/26/2014 1720   ALKPHOS 104 01/26/2014 1720   BILITOT 0.6 01/26/2014 1720   GFRNONAA >90 01/26/2014 1720   GFRAA >90  01/26/2014 1720   CT scan of the abdomen and pelvis reviewed from 06/30/2013    Assessment & Plan:  38 year old female with a past medical history of brittle type 1 diabetes with insulin pump, gastroparesis secondary to diabetes, hypertension, hyperlipidemia, seizure disorder who is here for followup.  1.  Diabetic gastroparesis/nausea/poor appetite/inability to gain weight -- fortunately her gastroparesis has improved and she has been on a drug  holiday from domperidone. She has made dietary modification which has improved her symptoms. I would like for her to see outpatient nutrition to help guide her diet and hopefully give her strategies to increase caloric intake. She will need to follow the gastroparesis diet. She can continue as needed promethazine, which she is using sparingly. We discussed option for appetite stimulation as well as nausea control. I will give her a trial of Marinol 2.5 mg twice daily. I asked that she notify me with any side effects. I will continue to hold domperidone and other promotility agents at this time. I would like to see her back in 6-8 weeks for followup. If symptoms persist or worsen, will consider repeating upper endoscopy. Previous upper endoscopy was unremarkable.  2. Constipation -- resolved and likely related to dehydration in the setting of gastroparesis flare in late May 2015  3.  type 1 diabetes -- she is being followed by endocrinology and has recently had change in her insulin dosing. See #1

## 2014-03-07 ENCOUNTER — Telehealth: Payer: Self-pay | Admitting: Internal Medicine

## 2014-03-09 NOTE — Telephone Encounter (Signed)
Prior Auth initiated by phone to [phone].  Review will be completed in 72 hours after clinical review.  Patient did pay cash at the pharmacy yesterday and has a one month supply.

## 2014-03-09 NOTE — Telephone Encounter (Signed)
Prior auth denied for Medicare.  FDA approval for Merinol is for N&V associated with HIV or chemotherapy.  Patient notified that drug was denied.  She is willing to pay the $161 for the med each month.  She states at this time is helping and she is able to eat.  She will call back with any additional questions or concerns

## 2014-04-26 DIAGNOSIS — E7800 Pure hypercholesterolemia, unspecified: Secondary | ICD-10-CM | POA: Diagnosis not present

## 2014-04-26 DIAGNOSIS — E109 Type 1 diabetes mellitus without complications: Secondary | ICD-10-CM | POA: Diagnosis not present

## 2014-04-26 DIAGNOSIS — E039 Hypothyroidism, unspecified: Secondary | ICD-10-CM | POA: Diagnosis not present

## 2014-05-02 ENCOUNTER — Ambulatory Visit (INDEPENDENT_AMBULATORY_CARE_PROVIDER_SITE_OTHER): Payer: Medicare Other | Admitting: Internal Medicine

## 2014-05-02 ENCOUNTER — Encounter: Payer: Self-pay | Admitting: Internal Medicine

## 2014-05-02 VITALS — BP 106/70 | HR 80 | Ht 61.0 in | Wt 109.8 lb

## 2014-05-02 DIAGNOSIS — R630 Anorexia: Secondary | ICD-10-CM | POA: Diagnosis not present

## 2014-05-02 DIAGNOSIS — E1065 Type 1 diabetes mellitus with hyperglycemia: Secondary | ICD-10-CM

## 2014-05-02 DIAGNOSIS — K3184 Gastroparesis: Secondary | ICD-10-CM | POA: Diagnosis not present

## 2014-05-02 DIAGNOSIS — R110 Nausea: Secondary | ICD-10-CM

## 2014-05-02 DIAGNOSIS — E109 Type 1 diabetes mellitus without complications: Secondary | ICD-10-CM

## 2014-05-02 MED ORDER — PROMETHAZINE HCL 12.5 MG PO TABS: 25.0000 mg | ORAL_TABLET | Freq: Four times a day (QID) | ORAL | Status: DC | PRN

## 2014-05-02 MED ORDER — DRONABINOL 2.5 MG PO CAPS: 2.5000 mg | ORAL_CAPSULE | Freq: Two times a day (BID) | ORAL | Status: DC

## 2014-05-02 NOTE — Patient Instructions (Signed)
We will refill Marinol and Phenergan for you  Follow up in 6 months

## 2014-05-02 NOTE — Progress Notes (Signed)
   Subjective:    Patient ID: Karen Hayden, female    DOB: 1976-04-26, 38 y.o.   MRN: [id number]  HPI  Karen Hayden is a 37 year old female with past medical history of brittle type 1 diabetes with insulin pump, gastroparesis, hypertension, hyperlipidemia and seizure disorder who is here for followup. She was last seen on 03/06/2014. She was hospitalized in late May/early June for gastroparesis flare in the setting of diuretic blood sugars. After her last visit she was started on Marinol for ongoing nausea. She returns today and is feeling very well. Marinol has help with appetite and nausea. She's been able to gain 2 pounds. She reports she is eating better with no nausea or vomiting. Blood sugars remained somewhat erratic but she attributes this to adjusting insulin now she is eating better. She is very rarely using Phenergan, and reports she has only used it several times in the last 8 weeks. One episode of vomiting in the last 8 weeks. She reports she feels very well today and is very happy. A limited return to normal without blood her stool or melena. She is not taking domperidone or Reglan at present.   Review of Systems As per history of present illness, otherwise negative  Current Medications, Allergies, Past Medical History, Past Surgical History, Family History and Social History were reviewed in [employer] record.     Objective:   Physical Exam BP 106/70  Pulse 80  Ht 5\' 1"  (1.549 m)  Wt 109 lb 12.8 oz (49.805 kg)  BMI 20.76 kg/m2 Constitutional: Well appearing female in no acute distress  HEENT: Normocephalic and atraumatic. Oropharynx is clear and moist. No oropharyngeal exudate. Conjunctivae are normal. No scleral icterus.  Neck: Neck supple. Trachea midline.  Cardiovascular: Normal rate, regular rhythm and intact distal pulses. No M/R/G  Pulmonary/chest: Effort normal and breath sounds normal. No wheezing, rales or rhonchi.  Abdominal: Soft, nontender,  nondistended. Bowel sounds active throughout. Insulin pump in place  Extremities: no clubbing, cyanosis, or edema  Neurological: Alert and oriented to person place and time.  Skin: Skin is warm and dry. No rashes noted. Multiple tattoos  Psychiatric: Normal mood and affect. Behavior is normal.     Assessment & Plan:  38 year old female with past medical history of brittle type 1 diabetes with insulin pump, gastroparesis, hypertension, hyperlipidemia and seizure disorder who is here for followup  1. Diabetic gastroparesis/nausea/poor appetite now improving -- [name] is doing well today and has tolerated Marinol well. It has helped with nausea and appetite stimulation. It was not covered by her insurance but she is willing to pay out of pocket because of the positive effect for her. She is taking low dose and will continue 2.5 mg twice daily. She can use Phenergan as needed and has been doing so sparingly.  2. type 1 diabetes -- followed by endocrine, tight glucose control is much as possible will help in diabetic gastroparesis and nausea with vomiting.  Return in 6 months unless earlier followup needed per patient

## 2014-05-09 DIAGNOSIS — E109 Type 1 diabetes mellitus without complications: Secondary | ICD-10-CM | POA: Diagnosis not present

## 2014-05-09 DIAGNOSIS — R634 Abnormal weight loss: Secondary | ICD-10-CM | POA: Diagnosis not present

## 2014-05-09 DIAGNOSIS — G608 Other hereditary and idiopathic neuropathies: Secondary | ICD-10-CM | POA: Diagnosis not present

## 2014-05-09 DIAGNOSIS — N390 Urinary tract infection, site not specified: Secondary | ICD-10-CM | POA: Diagnosis not present

## 2014-05-09 DIAGNOSIS — G609 Hereditary and idiopathic neuropathy, unspecified: Secondary | ICD-10-CM | POA: Diagnosis not present

## 2014-05-09 DIAGNOSIS — E7800 Pure hypercholesterolemia, unspecified: Secondary | ICD-10-CM | POA: Diagnosis not present

## 2014-05-09 DIAGNOSIS — E039 Hypothyroidism, unspecified: Secondary | ICD-10-CM | POA: Diagnosis not present

## 2014-05-10 ENCOUNTER — Other Ambulatory Visit: Payer: Self-pay | Admitting: Internal Medicine

## 2014-05-11 DIAGNOSIS — Z23 Encounter for immunization: Secondary | ICD-10-CM | POA: Diagnosis not present

## 2014-07-30 ENCOUNTER — Emergency Department (HOSPITAL_BASED_OUTPATIENT_CLINIC_OR_DEPARTMENT_OTHER)
Admission: EM | Admit: 2014-07-30 | Discharge: 2014-07-30 | Disposition: A | Discharge status: Home / Self Care | Payer: Medicare Other | Attending: Emergency Medicine | Admitting: Emergency Medicine

## 2014-07-30 ENCOUNTER — Encounter (HOSPITAL_BASED_OUTPATIENT_CLINIC_OR_DEPARTMENT_OTHER): Payer: Self-pay | Admitting: *Deleted

## 2014-07-30 DIAGNOSIS — I10 Essential (primary) hypertension: Secondary | ICD-10-CM | POA: Insufficient documentation

## 2014-07-30 DIAGNOSIS — G40909 Epilepsy, unspecified, not intractable, without status epilepticus: Secondary | ICD-10-CM | POA: Insufficient documentation

## 2014-07-30 DIAGNOSIS — E079 Disorder of thyroid, unspecified: Secondary | ICD-10-CM | POA: Insufficient documentation

## 2014-07-30 DIAGNOSIS — F319 Bipolar disorder, unspecified: Secondary | ICD-10-CM | POA: Insufficient documentation

## 2014-07-30 DIAGNOSIS — Z8719 Personal history of other diseases of the digestive system: Secondary | ICD-10-CM | POA: Diagnosis not present

## 2014-07-30 DIAGNOSIS — E1143 Type 2 diabetes mellitus with diabetic autonomic (poly)neuropathy: Secondary | ICD-10-CM | POA: Insufficient documentation

## 2014-07-30 DIAGNOSIS — Z79899 Other long term (current) drug therapy: Secondary | ICD-10-CM | POA: Diagnosis not present

## 2014-07-30 DIAGNOSIS — Z794 Long term (current) use of insulin: Secondary | ICD-10-CM | POA: Diagnosis not present

## 2014-07-30 DIAGNOSIS — Z3202 Encounter for pregnancy test, result negative: Secondary | ICD-10-CM | POA: Insufficient documentation

## 2014-07-30 DIAGNOSIS — K3184 Gastroparesis: Secondary | ICD-10-CM

## 2014-07-30 DIAGNOSIS — E1165 Type 2 diabetes mellitus with hyperglycemia: Secondary | ICD-10-CM | POA: Diagnosis not present

## 2014-07-30 DIAGNOSIS — R739 Hyperglycemia, unspecified: Secondary | ICD-10-CM

## 2014-07-30 DIAGNOSIS — Z720 Tobacco use: Secondary | ICD-10-CM | POA: Insufficient documentation

## 2014-07-30 LAB — CBC WITH DIFFERENTIAL/PLATELET
Basophils Absolute: 0.1 10*3/uL (ref 0.0–0.1)
Basophils Relative: 1 % (ref 0–1)
Eosinophils Absolute: 0.1 10*3/uL (ref 0.0–0.7)
Eosinophils Relative: 1 % (ref 0–5)
HCT: 40.9 % (ref 36.0–46.0)
Hemoglobin: 14.2 g/dL (ref 12.0–15.0)
LYMPHS ABS: 1.4 10*3/uL (ref 0.7–4.0)
Lymphocytes Relative: 13 % (ref 12–46)
MCH: 28.5 pg (ref 26.0–34.0)
MCHC: 34.7 g/dL (ref 30.0–36.0)
MCV: 82 fL (ref 78.0–100.0)
Monocytes Absolute: 0.6 10*3/uL (ref 0.1–1.0)
Monocytes Relative: 5 % (ref 3–12)
Neutro Abs: 8.4 10*3/uL — ABNORMAL HIGH (ref 1.7–7.7)
Neutrophils Relative %: 80 % — ABNORMAL HIGH (ref 43–77)
PLATELETS: 381 10*3/uL (ref 150–400)
RBC: 4.99 MIL/uL (ref 3.87–5.11)
RDW: 12.4 % (ref 11.5–15.5)
WBC: 10.5 10*3/uL (ref 4.0–10.5)

## 2014-07-30 LAB — BASIC METABOLIC PANEL
ANION GAP: 13 (ref 5–15)
Anion gap: 16 — ABNORMAL HIGH (ref 5–15)
BUN: 8 mg/dL (ref 6–23)
BUN: 6 mg/dL (ref 6–23)
CALCIUM: 9.8 mg/dL (ref 8.4–10.5)
CALCIUM: 8.4 mg/dL (ref 8.4–10.5)
CO2: 20 mEq/L (ref 19–32)
CO2: 19 mEq/L (ref 19–32)
Chloride: 99 mEq/L (ref 96–112)
Chloride: 105 mEq/L (ref 96–112)
Creatinine, Ser: 0.7 mg/dL (ref 0.50–1.10)
Creatinine, Ser: 0.6 mg/dL (ref 0.50–1.10)
GFR calc Af Amer: >90 mL/min (ref >90)
GLUCOSE: 310 mg/dL — AB (ref 70–99)
Glucose, Bld: 191 mg/dL — ABNORMAL HIGH (ref 70–99)
Potassium: 4.2 mEq/L (ref 3.7–5.3)
Potassium: 4.1 mEq/L (ref 3.7–5.3)
SODIUM: 137 meq/L (ref 137–147)
Sodium: 135 mEq/L — ABNORMAL LOW (ref 137–147)

## 2014-07-30 LAB — URINE MICROSCOPIC-ADD ON

## 2014-07-30 LAB — CBG MONITORING, ED: GLUCOSE-CAPILLARY: 199 mg/dL — AB (ref 70–99)

## 2014-07-30 LAB — URINALYSIS, ROUTINE W REFLEX MICROSCOPIC
Bilirubin Urine: NEGATIVE
Glucose, UA: 500 mg/dL — AB
HGB URINE DIPSTICK: NEGATIVE
Ketones, ur: >80 mg/dL — AB
Nitrite: NEGATIVE
PROTEIN: NEGATIVE mg/dL
Specific Gravity, Urine: 1.016 (ref 1.005–1.030)
Urobilinogen, UA: 1 mg/dL (ref 0.0–1.0)
pH: 6.5 (ref 5.0–8.0)

## 2014-07-30 LAB — I-STAT VENOUS BLOOD GAS, ED
Acid-base deficit: 5 mmol/L — ABNORMAL HIGH (ref 0.0–2.0)
Bicarbonate: 19.7 mEq/L — ABNORMAL LOW (ref 20.0–24.0)
O2 Saturation: 62 %
PCO2 VEN: 34.3 mmHg — AB (ref 45.0–50.0)
TCO2: 21 mmol/L (ref 0–100)
pH, Ven: 7.367 — ABNORMAL HIGH (ref 7.250–7.300)
pO2, Ven: 33 mmHg (ref 30.0–45.0)

## 2014-07-30 LAB — PREGNANCY, URINE: Preg Test, Ur: NEGATIVE

## 2014-07-30 MED ORDER — METOCLOPRAMIDE HCL 5 MG/ML IJ SOLN
10.0000 mg | Freq: Once | INTRAMUSCULAR | Status: AC
  Administered 2014-07-30: 10 mg via INTRAVENOUS
  Filled 2014-07-30: qty 2

## 2014-07-30 MED ORDER — SODIUM CHLORIDE 0.9 % IV BOLUS (SEPSIS)
1000.0000 mL | Freq: Once | INTRAVENOUS | Status: AC
  Administered 2014-07-30: 1000 mL via INTRAVENOUS

## 2014-07-30 NOTE — ED Notes (Signed)
Po challenge (water) given per VORB from [employer]

## 2014-07-30 NOTE — Discharge Instructions (Signed)
Gastroparesis  Gastroparesis is also called slowed stomach emptying (delayed gastric emptying). It is a condition in which the stomach takes too long to empty its contents. It often happens in people with diabetes.  CAUSES  Gastroparesis happens when nerves to the stomach are damaged or stop working. When the nerves are damaged, the muscles of the stomach and intestines do not work normally. The movement of food is slowed or stopped. High blood glucose (sugar) causes changes in nerves and can damage the blood vessels that carry oxygen and nutrients to the nerves. RISK FACTORS  Diabetes.  Post-viral syndromes.  Eating disorders (anorexia, bulimia).  Surgery on the stomach or vagus nerve.  Gastroesophageal reflux disease (rarely).  Smooth muscle disorders (amyloidosis, scleroderma).  Metabolic disorders, including hypothyroidism.  Parkinson disease. SYMPTOMS   Heartburn.  Feeling sick to your stomach (nausea).  Vomiting of undigested food.  An early feeling of fullness when eating.  Weight loss.  Abdominal bloating.  Erratic blood glucose levels.  Lack of appetite.  Gastroesophageal reflux.  Spasms of the stomach wall. Complications can include:  Bacterial overgrowth in stomach. Food stays in the stomach and can ferment and cause bacteria to grow.  Weight loss due to difficulty digesting and absorbing nutrients.  Vomiting.  Obstruction in the stomach. Undigested food can harden and cause nausea and vomiting.  Blood glucose fluctuations caused by inconsistent food absorption. DIAGNOSIS  The diagnosis of gastroparesis is confirmed through one or more of the following tests:  Barium X-rays and scans. These tests look at how long it takes for food to move through the stomach.  Gastric manometry. This test measures electrical and muscular activity in the stomach. A thin tube is passed down the throat into the stomach. The tube contains a wire that takes measurements  of the stomach's electrical and muscular activity as it digests liquids and solid food.  Endoscopy. This procedure is done with a long, thin tube called an endoscope. It is passed through the mouth and gently down the esophagus into the stomach. This tube helps the caregiver look at the lining of the stomach to check for any abnormalities.  Ultrasonography. This can rule out gallbladder disease or pancreatitis. This test will outline and define the shape of the gallbladder and pancreas. TREATMENT   Treatments may include:  Exercise.  Medicines to control nausea and vomiting.  Medicines to stimulate stomach muscles.  Changes in what and when you eat.  Having smaller meals more often.  Eating low-fiber forms of high-fiber foods, such as eating cooked vegetables instead of raw vegetables.  Eating low-fat foods.  Consuming liquids, which are easier to digest.  In severe cases, feeding tubes and intravenous (IV) feeding may be needed. It is important to note that in most cases, treatment does not cure gastroparesis. It is usually a lasting (chronic) condition. Treatment helps you manage the underlying condition so that you can be as healthy and comfortable as possible. Other treatments  A gastric neurostimulator has been developed to assist people with gastroparesis. The battery-operated device is surgically implanted. It emits mild electrical pulses to help improve stomach emptying and to control nausea and vomiting.  The use of botulinum toxin has been shown to improve stomach emptying by decreasing the prolonged contractions of the muscle between the stomach and the small intestine (pyloric sphincter). The benefits are temporary. SEEK MEDICAL CARE IF:   You have diabetes and you are having problems keeping your blood glucose in goal range.  You are having nausea,  vomiting, bloating, or early feelings of fullness with eating.  Your symptoms do not change with a change in  diet. Document Released: 08/17/2005 Document Revised: 12/12/2012 Document Reviewed: 01/24/2009 [facility] Patient Information 2015 [place], [place]. This information is not intended to replace advice given to you by your health care provider. Make sure you discuss any questions you have with your health care provider.  Hyperglycemia Hyperglycemia occurs when the glucose (sugar) in your blood is too high. Hyperglycemia can happen for many reasons, but it most often happens to people who do not know they have diabetes or are not managing their diabetes properly.  CAUSES  Whether you have diabetes or not, there are other causes of hyperglycemia. Hyperglycemia can occur when you have diabetes, but it can also occur in other situations that you might not be as aware of, such as: Diabetes  If you have diabetes and are having problems controlling your blood glucose, hyperglycemia could occur because of some of the following reasons:  Not following your meal plan.  Not taking your diabetes medications or not taking it properly.  Exercising less or doing less activity than you normally do.  Being sick. Pre-diabetes  This cannot be ignored. Before people develop Type 2 diabetes, they almost always have "pre-diabetes." This is when your blood glucose levels are higher than normal, but not yet high enough to be diagnosed as diabetes. Research has shown that some long-term damage to the body, especially the heart and circulatory system, may already be occurring during pre-diabetes. If you take action to manage your blood glucose when you have pre-diabetes, you may delay or prevent Type 2 diabetes from developing. Stress  If you have diabetes, you may be "diet" controlled or on oral medications or insulin to control your diabetes. However, you may find that your blood glucose is higher than usual in the hospital whether you have diabetes or not. This is often referred to as "stress hyperglycemia." Stress can  elevate your blood glucose. This happens because of hormones put out by the body during times of stress. If stress has been the cause of your high blood glucose, it can be followed regularly by your caregiver. That way he/she can make sure your hyperglycemia does not continue to get worse or progress to diabetes. Steroids  Steroids are medications that act on the infection fighting system (immune system) to block inflammation or infection. One side effect can be a rise in blood glucose. Most people can produce enough extra insulin to allow for this rise, but for those who cannot, steroids make blood glucose levels go even higher. It is not unusual for steroid treatments to "uncover" diabetes that is developing. It is not always possible to determine if the hyperglycemia will go away after the steroids are stopped. A special blood test called an A1c is sometimes done to determine if your blood glucose was elevated before the steroids were started. SYMPTOMS  Thirsty.  Frequent urination.  Dry mouth.  Blurred vision.  Tired or fatigue.  Weakness.  Sleepy.  Tingling in feet or leg. DIAGNOSIS  Diagnosis is made by monitoring blood glucose in one or all of the following ways:  A1c test. This is a chemical found in your blood.  Fingerstick blood glucose monitoring.  Laboratory results. TREATMENT  First, knowing the cause of the hyperglycemia is important before the hyperglycemia can be treated. Treatment may include, but is not be limited to:  Education.  Change or adjustment in medications.  Change or adjustment  in meal plan.  Treatment for an illness, infection, etc.  More frequent blood glucose monitoring.  Change in exercise plan.  Decreasing or stopping steroids.  Lifestyle changes. HOME CARE INSTRUCTIONS   Test your blood glucose as directed.  Exercise regularly. Your caregiver will give you instructions about exercise. Pre-diabetes or diabetes which comes on with  stress is helped by exercising.  Eat wholesome, balanced meals. Eat often and at regular, fixed times. Your caregiver or nutritionist will give you a meal plan to guide your sugar intake.  Being at an ideal weight is important. If needed, losing as little as 10 to 15 pounds may help improve blood glucose levels. SEEK MEDICAL CARE IF:   You have questions about medicine, activity, or diet.  You continue to have symptoms (problems such as increased thirst, urination, or weight gain). SEEK IMMEDIATE MEDICAL CARE IF:   You are vomiting or have diarrhea.  Your breath smells fruity.  You are breathing faster or slower.  You are very sleepy or incoherent.  You have numbness, tingling, or pain in your feet or hands.  You have chest pain.  Your symptoms get worse even though you have been following your caregiver's orders.  If you have any other questions or concerns. Document Released: 02/10/2001 Document Revised: 11/09/2011 Document Reviewed: 12/14/2011 [employer] Patient Information 2015 [place], [place]. This information is not intended to replace advice given to you by your health care provider. Make sure you discuss any questions you have with your health care provider.

## 2014-07-30 NOTE — ED Provider Notes (Signed)
CSN: [id number]     Arrival date & time 07/30/14  1105 History   First MD Initiated Contact with Patient 07/30/14 1115     Chief Complaint  Patient presents with  . Diabetes     (Consider location/radiation/quality/duration/timing/severity/associated sxs/prior Treatment) HPI Comments: Pt comes in today with complaints of fatigue, vomiting, and no appetite for 2 days. She states that she has not been able to urinate for 1 day. She states that she noticed ketones in her urine. Has not had fever or abdominal pain that is different then her normal. Had some diarrhea over the last days. She does have and insulin pump and she states that her bs has not been above 350 but it seems to be going up and down.   The history is provided by the patient. No language interpreter was used.    Past Medical History  Diagnosis Date  . DM (diabetes mellitus)   . Diabetic gastroparesis   . Seizure disorder   . Hypertension   . Umbilical hernia   . Hyperlipidemia   . Thyroid disease   . Gastroparesis     Due to diabetes   . Bipolar 1 disorder    Past Surgical History  Procedure Laterality Date  . Vaginal hysterectomy    . Tonsillectomy     Family History  Problem Relation Age of Onset  . Diabetes Mother   . Heart attack Mother   . Ovarian cancer Paternal Grandmother     great grandmother   History  Substance Use Topics  . Smoking status: Current Every Day Smoker -- 2.00 packs/day    Types: Cigarettes  . Smokeless tobacco: Never Used  . Alcohol Use: No   OB History    No data available     Review of Systems  All other systems reviewed and are negative.     Allergies  Lipitor and Codeine  Home Medications   Prior to Admission medications   Medication Sig Start Date End Date Taking? Authorizing Provider  gabapentin (NEURONTIN) 300 MG capsule Take 300 mg by mouth 3 (three) times daily.   Yes Historical Provider, MD  dronabinol (MARINOL) 2.5 MG capsule Take 1 capsule (2.5 mg  total) by mouth 2 (two) times daily before a meal. 05/02/14   Jerene Bears, MD  Insulin Human (INSULIN PUMP) SOLN Inject into the skin continuous. humalog    Historical Provider, MD  levothyroxine (SYNTHROID, LEVOTHROID) 100 MCG tablet Take 100 mcg by mouth daily.    Historical Provider, MD  promethazine (PHENERGAN) 12.5 MG tablet TAKE TWO TABLETS BY MOUTH EVERY 6 HOURS AS NEEDED FOR NAUSEA AND VOMITING 05/10/14   Jerene Bears, MD   There were no vitals taken for this visit. Physical Exam  Constitutional: She is oriented to person, place, and time. She appears well-developed and well-nourished.  HENT:  Head: Normocephalic and atraumatic.  Neck: Normal range of motion. Neck supple.  Cardiovascular: Normal rate and regular rhythm.   Pulmonary/Chest: Effort normal and breath sounds normal.  Abdominal: Soft. Bowel sounds are normal. There is no tenderness.  Neurological: She is alert and oriented to person, place, and time.  Skin: Skin is warm and dry.  Psychiatric: She has a normal mood and affect.  Nursing note and vitals reviewed.   ED Course  Procedures (including critical care time) Labs Review Labs Reviewed  BASIC METABOLIC PANEL - Abnormal; Notable for the following:    Sodium 135 (*)    Glucose, Bld 310 (*)  Anion gap 16 (*)    All other components within normal limits  CBC WITH DIFFERENTIAL - Abnormal; Notable for the following:    Neutrophils Relative % 80 (*)    Neutro Abs 8.4 (*)    All other components within normal limits  URINALYSIS, ROUTINE W REFLEX MICROSCOPIC - Abnormal; Notable for the following:    APPearance CLOUDY (*)    Glucose, UA 500 (*)    Ketones, ur >80 (*)    Leukocytes, UA SMALL (*)    All other components within normal limits  URINE MICROSCOPIC-ADD ON - Abnormal; Notable for the following:    Squamous Epithelial / LPF MANY (*)    Bacteria, UA MANY (*)    All other components within normal limits  BASIC METABOLIC PANEL - Abnormal; Notable for the  following:    Glucose, Bld 191 (*)    All other components within normal limits  CBG MONITORING, ED - Abnormal; Notable for the following:    Glucose-Capillary 199 (*)    All other components within normal limits  I-STAT VENOUS BLOOD GAS, ED - Abnormal; Notable for the following:    pH, Ven 7.367 (*)    pCO2, Ven 34.3 (*)    Bicarbonate 19.7 (*)    Acid-base deficit 5.0 (*)    All other components within normal limits  PREGNANCY, URINE  CBG MONITORING, ED    Imaging Review No results found.   EKG Interpretation None      MDM   Final diagnoses:  Hyperglycemia  Gastroparesis    Pt labs a lot better after fluids and pt if tolerating po and is feeling better at this time. She has nausea medication at home   Glendell Docker, NP 07/30/14 [place], [place] 07/31/14 2308

## 2014-07-30 NOTE — ED Notes (Addendum)
Pt reports fatique, vomiting since Saturday.   Uncontrolled diabetes.  Urinary retention x 1 day.

## 2014-07-31 LAB — CBG MONITORING, ED: Glucose-Capillary: 255 mg/dL — ABNORMAL HIGH (ref 70–99)

## 2014-08-02 DIAGNOSIS — E039 Hypothyroidism, unspecified: Secondary | ICD-10-CM | POA: Diagnosis not present

## 2014-08-02 DIAGNOSIS — E780 Pure hypercholesterolemia: Secondary | ICD-10-CM | POA: Diagnosis not present

## 2014-08-02 DIAGNOSIS — E109 Type 1 diabetes mellitus without complications: Secondary | ICD-10-CM | POA: Diagnosis not present

## 2014-08-02 DIAGNOSIS — G609 Hereditary and idiopathic neuropathy, unspecified: Secondary | ICD-10-CM | POA: Diagnosis not present

## 2014-08-13 DIAGNOSIS — L03032 Cellulitis of left toe: Secondary | ICD-10-CM | POA: Diagnosis not present

## 2014-08-13 DIAGNOSIS — L600 Ingrowing nail: Secondary | ICD-10-CM | POA: Diagnosis not present

## 2014-08-13 DIAGNOSIS — M79672 Pain in left foot: Secondary | ICD-10-CM | POA: Diagnosis not present

## 2014-09-10 DIAGNOSIS — E039 Hypothyroidism, unspecified: Secondary | ICD-10-CM | POA: Diagnosis not present

## 2014-09-10 DIAGNOSIS — G609 Hereditary and idiopathic neuropathy, unspecified: Secondary | ICD-10-CM | POA: Diagnosis not present

## 2014-09-10 DIAGNOSIS — E109 Type 1 diabetes mellitus without complications: Secondary | ICD-10-CM | POA: Diagnosis not present

## 2014-09-10 DIAGNOSIS — E780 Pure hypercholesterolemia: Secondary | ICD-10-CM | POA: Diagnosis not present

## 2014-09-13 ENCOUNTER — Other Ambulatory Visit: Payer: Self-pay | Admitting: Internal Medicine

## 2014-10-25 ENCOUNTER — Other Ambulatory Visit: Payer: Self-pay | Admitting: Internal Medicine

## 2014-12-18 ENCOUNTER — Encounter (HOSPITAL_BASED_OUTPATIENT_CLINIC_OR_DEPARTMENT_OTHER): Payer: Self-pay

## 2014-12-18 ENCOUNTER — Emergency Department (HOSPITAL_BASED_OUTPATIENT_CLINIC_OR_DEPARTMENT_OTHER): Payer: Medicare Other

## 2014-12-18 ENCOUNTER — Emergency Department (HOSPITAL_BASED_OUTPATIENT_CLINIC_OR_DEPARTMENT_OTHER)
Admission: EM | Admit: 2014-12-18 | Discharge: 2014-12-18 | Disposition: A | Discharge status: Home / Self Care | Payer: Medicare Other | Attending: Emergency Medicine | Admitting: Emergency Medicine

## 2014-12-18 DIAGNOSIS — Z794 Long term (current) use of insulin: Secondary | ICD-10-CM | POA: Insufficient documentation

## 2014-12-18 DIAGNOSIS — I10 Essential (primary) hypertension: Secondary | ICD-10-CM | POA: Diagnosis not present

## 2014-12-18 DIAGNOSIS — R112 Nausea with vomiting, unspecified: Secondary | ICD-10-CM | POA: Insufficient documentation

## 2014-12-18 DIAGNOSIS — R590 Localized enlarged lymph nodes: Secondary | ICD-10-CM | POA: Diagnosis not present

## 2014-12-18 DIAGNOSIS — F319 Bipolar disorder, unspecified: Secondary | ICD-10-CM | POA: Insufficient documentation

## 2014-12-18 DIAGNOSIS — Z720 Tobacco use: Secondary | ICD-10-CM | POA: Diagnosis not present

## 2014-12-18 DIAGNOSIS — Z79899 Other long term (current) drug therapy: Secondary | ICD-10-CM | POA: Insufficient documentation

## 2014-12-18 DIAGNOSIS — Z8669 Personal history of other diseases of the nervous system and sense organs: Secondary | ICD-10-CM | POA: Insufficient documentation

## 2014-12-18 DIAGNOSIS — E1043 Type 1 diabetes mellitus with diabetic autonomic (poly)neuropathy: Secondary | ICD-10-CM | POA: Insufficient documentation

## 2014-12-18 DIAGNOSIS — Z3202 Encounter for pregnancy test, result negative: Secondary | ICD-10-CM | POA: Diagnosis not present

## 2014-12-18 DIAGNOSIS — R0789 Other chest pain: Secondary | ICD-10-CM | POA: Diagnosis not present

## 2014-12-18 DIAGNOSIS — R079 Chest pain, unspecified: Secondary | ICD-10-CM

## 2014-12-18 DIAGNOSIS — Z8719 Personal history of other diseases of the digestive system: Secondary | ICD-10-CM | POA: Insufficient documentation

## 2014-12-18 DIAGNOSIS — E079 Disorder of thyroid, unspecified: Secondary | ICD-10-CM | POA: Insufficient documentation

## 2014-12-18 DIAGNOSIS — R630 Anorexia: Secondary | ICD-10-CM | POA: Diagnosis not present

## 2014-12-18 DIAGNOSIS — R0602 Shortness of breath: Secondary | ICD-10-CM | POA: Diagnosis not present

## 2014-12-18 DIAGNOSIS — R591 Generalized enlarged lymph nodes: Secondary | ICD-10-CM | POA: Diagnosis not present

## 2014-12-18 DIAGNOSIS — R5383 Other fatigue: Secondary | ICD-10-CM | POA: Insufficient documentation

## 2014-12-18 DIAGNOSIS — M542 Cervicalgia: Secondary | ICD-10-CM | POA: Diagnosis not present

## 2014-12-18 LAB — COMPREHENSIVE METABOLIC PANEL
ALT: 14 U/L (ref 0–35)
AST: 15 U/L (ref 0–37)
Albumin: 4.3 g/dL (ref 3.5–5.2)
Alkaline Phosphatase: 86 U/L (ref 39–117)
Anion gap: 6 (ref 5–15)
BUN: 6 mg/dL (ref 6–23)
CO2: 27 mmol/L (ref 19–32)
Calcium: 9.6 mg/dL (ref 8.4–10.5)
Chloride: 106 mmol/L (ref 96–112)
Creatinine, Ser: 0.75 mg/dL (ref 0.50–1.10)
GFR calc non Af Amer: >90 mL/min (ref >90)
GLUCOSE: 76 mg/dL (ref 70–99)
Potassium: 4.1 mmol/L (ref 3.5–5.1)
Sodium: 139 mmol/L (ref 135–145)
Total Bilirubin: 0.4 mg/dL (ref 0.3–1.2)
Total Protein: 7.2 g/dL (ref 6.0–8.3)

## 2014-12-18 LAB — CBC WITH DIFFERENTIAL/PLATELET
Basophils Absolute: 0 10*3/uL (ref 0.0–0.1)
Basophils Relative: 0 % (ref 0–1)
Eosinophils Absolute: 0.2 10*3/uL (ref 0.0–0.7)
Eosinophils Relative: 2 % (ref 0–5)
HCT: 42.6 % (ref 36.0–46.0)
Hemoglobin: 14.4 g/dL (ref 12.0–15.0)
LYMPHS ABS: 2.1 10*3/uL (ref 0.7–4.0)
LYMPHS PCT: 19 % (ref 12–46)
MCH: 28 pg (ref 26.0–34.0)
MCHC: 33.8 g/dL (ref 30.0–36.0)
MCV: 82.7 fL (ref 78.0–100.0)
Monocytes Absolute: 0.9 10*3/uL (ref 0.1–1.0)
Monocytes Relative: 8 % (ref 3–12)
NEUTROS ABS: 7.8 10*3/uL — AB (ref 1.7–7.7)
NEUTROS PCT: 71 % (ref 43–77)
Platelets: 333 10*3/uL (ref 150–400)
RBC: 5.15 MIL/uL — AB (ref 3.87–5.11)
RDW: 13 % (ref 11.5–15.5)
WBC: 10.9 10*3/uL — AB (ref 4.0–10.5)

## 2014-12-18 LAB — PREGNANCY, URINE: PREG TEST UR: NEGATIVE

## 2014-12-18 LAB — TROPONIN I

## 2014-12-18 LAB — BRAIN NATRIURETIC PEPTIDE: B Natriuretic Peptide: 28.4 pg/mL (ref 0.0–100.0)

## 2014-12-18 LAB — CBG MONITORING, ED: Glucose-Capillary: 310 mg/dL — ABNORMAL HIGH (ref 70–99)

## 2014-12-18 MED ORDER — IOHEXOL 300 MG/ML  SOLN
80.0000 mL | Freq: Once | INTRAMUSCULAR | Status: AC | PRN
  Administered 2014-12-18: 80 mL via INTRAVENOUS

## 2014-12-18 MED ORDER — CLINDAMYCIN HCL 150 MG PO CAPS: 450.0000 mg | ORAL_CAPSULE | Freq: Three times a day (TID) | ORAL | Status: DC

## 2014-12-18 NOTE — ED Provider Notes (Signed)
CSN: [id number]     Arrival date & time 12/18/14  1552 History   First MD Initiated Contact with Patient 12/18/14 1701     Chief Complaint  Patient presents with  . Mass     (Consider location/radiation/quality/duration/timing/severity/associated sxs/prior Treatment) The history is provided by the patient. No language interpreter was used.  CRIS GIBBY is a 55 y//o F with PMHx of DM type I, gastroparesis, seizure disorder, HTN, thyroid disease, bipolar disorder presenting to the ED with growth in her submental region that has been ongoing since end of March 2016. Patient reported that she has noticed swelling to the submental region with tenderness upon palpation. Reported that she has pain with swallowing and difficulty swallowing secondary to the mass feeling in her submental region. Reported that she has had decrease in appetite, weight loss of 33 pounds within 6 months unexpected and without trying to loose weight. Stated that she has been easily fatigue - without energy and does not want to get out of bed. Patient reported that she has been having intermittent chills and night sweats. Stated that she has also been experiencing chest pain and shortness of breath for the past 6 months. Stated that she has been having nausea and vomiting for the past couple of weeks - reported that she does have history of gastroparesis and nausea and vomiting are chronic for her at times. Stated that she has an insulin pump. Reported that her mother has history of heart disease. Stated that she does smoke 1 -1.5 ppd of cigarettes for the past 10 years. Stated that she had an [place] of her thyroid performed approximately 4 years ago, stated that there were thyroid nodules noted. Reported that she did call her primary care provider but was unable to get an appointment until 01/09/2015. Denied fever, hemoptysis, leg swelling, fainting, abdominal pain. PCP Dr. Chalmers Cater  Past Medical History  Diagnosis Date  . DM (diabetes  mellitus)   . Diabetic gastroparesis   . Seizure disorder   . Hypertension   . Umbilical hernia   . Hyperlipidemia   . Thyroid disease   . Gastroparesis     Due to diabetes   . Bipolar 1 disorder    Past Surgical History  Procedure Laterality Date  . Vaginal hysterectomy    . Tonsillectomy     Family History  Problem Relation Age of Onset  . Diabetes Mother   . Heart attack Mother   . Ovarian cancer Paternal Grandmother     great grandmother   History  Substance Use Topics  . Smoking status: Current Every Day Smoker -- 2.00 packs/day    Types: Cigarettes  . Smokeless tobacco: Never Used  . Alcohol Use: No   OB History    No data available     Review of Systems  Constitutional: Positive for appetite change, fatigue and unexpected weight change. Negative for fever and chills.  Eyes: Negative for visual disturbance.  Respiratory: Positive for cough and shortness of breath. Negative for chest tightness.   Cardiovascular: Positive for chest pain.  Gastrointestinal: Positive for nausea and vomiting. Negative for abdominal pain, diarrhea, constipation, blood in stool and anal bleeding.  Musculoskeletal: Positive for neck pain. Negative for back pain and neck stiffness.  Neurological: Negative for dizziness, weakness and headaches.      Allergies  Lipitor and Codeine  Home Medications   Prior to Admission medications   Medication Sig Start Date End Date Taking? Authorizing Provider  clindamycin (CLEOCIN) 150 MG  capsule Take 3 capsules (450 mg total) by mouth 3 (three) times daily. 12/18/14   Envy Meno, PA-C  dronabinol (MARINOL) 2.5 MG capsule Take 1 capsule (2.5 mg total) by mouth 2 (two) times daily before a meal. 05/02/14   Jerene Bears, MD  gabapentin (NEURONTIN) 300 MG capsule Take 300 mg by mouth 3 (three) times daily.    Historical Provider, MD  Insulin Human (INSULIN PUMP) SOLN Inject into the skin continuous. humalog    Historical Provider, MD   levothyroxine (SYNTHROID, LEVOTHROID) 100 MCG tablet Take 100 mcg by mouth daily.    Historical Provider, MD  promethazine (PHENERGAN) 12.5 MG tablet TAKE TWO TABLETS BY MOUTH EVERY 6 HOURS AS NEEDED FOR NAUSEA AND VOMITING 09/13/14   Jerene Bears, MD   BP 123/54 mmHg  Pulse 62  Temp(Src) 98.1 F (36.7 C) (Oral)  Resp 18  Ht 5\' 1"  (1.549 m)  Wt 115 lb (52.164 kg)  BMI 21.74 kg/m2  SpO2 100% Physical Exam  Constitutional: She is oriented to person, place, and time. She appears well-developed and well-nourished. No distress.  HENT:  Head: Normocephalic and atraumatic.  Mouth/Throat: Oropharynx is clear and moist. No oropharyngeal exudate.  Negative facial swelling Negative signs of cellulitic infection Negative trismus Uvula midline with symmetrical elevation Negative uvula swelling Negative sublingual lesions Numerous dental caries identified to the teeth with negative findings of abscess or dental infection Negative tongue swelling    Eyes: Conjunctivae and EOM are normal. Pupils are equal, round, and reactive to light. Right eye exhibits no discharge. Left eye exhibits no discharge.  Neck: Normal range of motion. Neck supple. No tracheal deviation present. No thyromegaly present.  Negative goiter noted on exam   Small mass palpated to the submental region with negative fluctuance of induration noted. Tenderness upon palpation. Negative trismus. Negative drainage or bleeding.  Cardiovascular: Normal rate, regular rhythm and normal heart sounds.  Exam reveals no friction rub.   No murmur heard. Pulses:      Radial pulses are 2+ on the right side, and 2+ on the left side.       Dorsalis pedis pulses are 2+ on the right side, and 2+ on the left side.  Cap refill less than 3 seconds Negative swelling or pitting edema identified to the lower extremities bilaterally  Pulmonary/Chest: Effort normal and breath sounds normal. No respiratory distress. She has no wheezes. She has no  rales.  Abdominal: Soft. Bowel sounds are normal. She exhibits no distension. There is no tenderness. There is no rebound and no guarding.  Musculoskeletal: Normal range of motion.  Lymphadenopathy:    She has no cervical adenopathy.  Neurological: She is alert and oriented to person, place, and time. No cranial nerve deficit. She exhibits normal muscle tone. Coordination normal.  Skin: Skin is warm and dry. No rash noted. She is not diaphoretic. No erythema.  Psychiatric: She has a normal mood and affect. Her behavior is normal. Thought content normal.  Nursing note and vitals reviewed.   ED Course  Procedures (including critical care time)  Results for orders placed or performed during the hospital encounter of 12/18/14  CBC with Differential/Platelet  Result Value Ref Range   WBC 10.9 (H) 4.0 - 10.5 K/uL   RBC 5.15 (H) 3.87 - 5.11 MIL/uL   Hemoglobin 14.4 12.0 - 15.0 g/dL   HCT 42.6 36.0 - 46.0 %   MCV 82.7 78.0 - 100.0 fL   MCH 28.0 26.0 - 34.0 pg  MCHC 33.8 30.0 - 36.0 g/dL   RDW 13.0 11.5 - 15.5 %   Platelets 333 150 - 400 K/uL   Neutrophils Relative % 71 43 - 77 %   Neutro Abs 7.8 (H) 1.7 - 7.7 K/uL   Lymphocytes Relative 19 12 - 46 %   Lymphs Abs 2.1 0.7 - 4.0 K/uL   Monocytes Relative 8 3 - 12 %   Monocytes Absolute 0.9 0.1 - 1.0 K/uL   Eosinophils Relative 2 0 - 5 %   Eosinophils Absolute 0.2 0.0 - 0.7 K/uL   Basophils Relative 0 0 - 1 %   Basophils Absolute 0.0 0.0 - 0.1 K/uL  Comprehensive metabolic panel  Result Value Ref Range   Sodium 139 135 - 145 mmol/L   Potassium 4.1 3.5 - 5.1 mmol/L   Chloride 106 96 - 112 mmol/L   CO2 27 19 - 32 mmol/L   Glucose, Bld 76 70 - 99 mg/dL   BUN 6 6 - 23 mg/dL   Creatinine, Ser 0.75 0.50 - 1.10 mg/dL   Calcium 9.6 8.4 - 10.5 mg/dL   Total Protein 7.2 6.0 - 8.3 g/dL   Albumin 4.3 3.5 - 5.2 g/dL   AST 15 0 - 37 U/L   ALT 14 0 - 35 U/L   Alkaline Phosphatase 86 39 - 117 U/L   Total Bilirubin 0.4 0.3 - 1.2 mg/dL   GFR  calc non Af Amer >90 >90 mL/min   GFR calc Af Amer >90 >90 mL/min   Anion gap 6 5 - 15  Pregnancy, urine  Result Value Ref Range   Preg Test, Ur NEGATIVE NEGATIVE  Troponin I  Result Value Ref Range   Troponin I <0.03 <0.031 ng/mL  Brain natriuretic peptide  Result Value Ref Range   B Natriuretic Peptide 28.4 0.0 - 100.0 pg/mL  CBG monitoring, ED  Result Value Ref Range   Glucose-Capillary 310 (H) 70 - 99 mg/dL    Labs Review Labs Reviewed  CBC WITH DIFFERENTIAL/PLATELET - Abnormal; Notable for the following:    WBC 10.9 (*)    RBC 5.15 (*)    Neutro Abs 7.8 (*)    All other components within normal limits  CBG MONITORING, ED - Abnormal; Notable for the following:    Glucose-Capillary 310 (*)    All other components within normal limits  COMPREHENSIVE METABOLIC PANEL  PREGNANCY, URINE  TROPONIN I  BRAIN NATRIURETIC PEPTIDE    Imaging Review Dg Chest 2 View  12/18/2014   CLINICAL DATA:  Mass under the chin.  Mid chest pain.  EXAM: CHEST  2 VIEW  COMPARISON:  01/26/2014  FINDINGS: The heart size and mediastinal contours are within normal limits. Both lungs are clear. The visualized skeletal structures are unremarkable.  IMPRESSION: No active cardiopulmonary disease.   Electronically Signed   By: Kathreen Devoid   On: 12/18/2014 19:01   Ct Soft Tissue Neck W Contrast  12/18/2014   CLINICAL DATA:  Pt states that she noticed a "lump on my throat", area marked with Vit E capsule, pt states that lump has been there since the 1st weekend in April, pt co diff swallowing, diff breathing, hoarseness, current smoker 1 ppd x 11 years  EXAM: CT NECK WITH CONTRAST  TECHNIQUE: Multidetector CT imaging of the neck was performed using the standard protocol following the bolus administration of intravenous contrast.  CONTRAST:  20mL OMNIPAQUE IOHEXOL 300 MG/ML  SOLN  COMPARISON:  11/18/2009  FINDINGS: Pharynx and larynx:  Normal  Salivary glands: Normal  Thyroid: No significant abnormalities.  2 mm  nodule on the right.  Lymph nodes: Bilateral small lypmph nodes diffusely in anterior and posterior neck. There are 2 submental lymph nodes which measure 7 and 8 mm respectively and it is at this level on the left side but the vitamin-E capsule is placed.36mm submandibular lymph nodes bilaterally. Small carotid chain and posterior triangle nodes.  Vascular: Normal  Limited intracranial: Normal  Visualized orbits: Normal  Mastoids and visualized paranasal sinuses: Left mastoid air cells are opacified. Partial opacification right mastoid air cells.  Skeleton: Normal  Upper chest: Normal  IMPRESSION: Mildly prominent submental lymph nodes appear to account for the area of palpable concern.  Bilateral mastoid effusions similar to prior MRI   Electronically Signed   By: Skipper Cliche M.D.   On: 12/18/2014 19:55     EKG Interpretation   Date/Time:  Tuesday December 18 2014 18:11:13 EDT Ventricular Rate:  59 PR Interval:  200 QRS Duration: 88 QT Interval:  412 QTC Calculation: 407 R Axis:   84 Text Interpretation:  Sinus bradycardia with sinus arrhythmia Cannot rule  out Anterior infarct , age undetermined Abnormal ECG since last tracing no  significant change Confirmed by BELFI  MD, MELANIE ([id number]) on 12/18/2014  7:14:32 PM      9:39 PM This provider reassess the patient. Patient sitting upright comfortably in bed drinking a soda from a fast food restaurant-no signs of difficulty with swallowing. No episodes of emesis in ED setting. Discussed labs and imaging in great detail with patient and family at bedside, patient gave the okay discussed findings in front of family. Discussed plan for discharge.  10:22 PM Glucose 310-patient just ate [employer] sandwich and fries.   MDM   Final diagnoses:  Lymphadenopathy, submental  Chest pain, unspecified chest pain type    Medications  iohexol (OMNIPAQUE) 300 MG/ML solution 80 mL (80 mLs Intravenous Contrast Given 12/18/14 1936)    Filed Vitals:    12/18/14 1603 12/18/14 1846 12/18/14 2051  BP: 136/85 130/83 123/54  Pulse: 85 69 62  Temp: 98.1 F (36.7 C)    TempSrc: Oral    Resp: 18 18 18   Height: 5\' 1"  (1.549 m)    Weight: 115 lb (52.164 kg)    SpO2: 100% 100% 100%    EKG noted sinus bradycardia with a heart rate of 59 bpm - no significant change since last tracing. Troponin negative elevation. BNP negative elevation. CBC noted mildly elevated white blood cell count 10.9. Hemoglobin 14.4, hematocrit 42.6. CMP unremarkable. Urine pregnancy negative. Chest x-ray no active cardio pulmonary disease noted. CT soft tissue of neck noted mildly prominent submental lymph node that appears to be accounted area palpable concern-bilateral mastoid effusions similar to prior MRI. Patient presenting to the ED with submental swelling/mass that started towards the end of March, beginning of April of this year. CT soft tissue identified lymphadenopathy. Mouth exam negative for acute dental infection-negative findings of appreciable abscess noted at this time. Negative trismus. Negative findings of retropharyngeal abscess. Negative findings of peritonsillar abscess. Negative findings of Ludwig's angina. Negative findings of pneumonia. We'll cover patient with antibiotics secondary to being immunocompromised secondary to diabetes. Patient tolerating fluids by mouth without difficulty-negative episodes of emesis in ED setting. Doubt anaphylactic reaction. Doubt DKA. Doubt goiter. Doubt thyroid storm or acute thyroiditis. Doubt ACS, chest pain has been ongoing for approximately 6 months-EKG unremarkable and no elevated troponin. Patient stable, afebrile. Patient not septic appearing.  Negative signs of respiratory distress. Discharged patient. Discharge patient with antibiotics. Discussed with patient to rest and stay hydrated. Discussed with patient to follow up with primary care provider. Educated patient on signs and symptoms to watch out for regarding worsening  case. Discussed with patient smoking cessation. Discussed with patient to closely monitor symptoms and if symptoms are to worsen or change to report back to the ED - strict return instructions given.  Patient agreed to plan of care, understood, all questions answered.   Jamse Mead, PA-C 12/18/14 [place], PA-C 12/18/14 [place], MD 12/19/14 [phone]

## 2014-12-18 NOTE — ED Notes (Signed)
Pt walked to xray

## 2014-12-18 NOTE — ED Notes (Signed)
C/o lump under chin, fatigue, weight loss started end of March 2016

## 2014-12-18 NOTE — ED Notes (Signed)
Patient transported to CT 

## 2014-12-18 NOTE — ED Notes (Signed)
MD at bedside. 

## 2014-12-18 NOTE — ED Notes (Signed)
Pt c/o mass under chin since end of march.  Has had trouble swallowing, loss of appetite and weight loss.  Pt called endocrinologist to get appt but they told her they where booked, pt concerned due to smoking 1+ daily for 10+ years

## 2014-12-18 NOTE — Discharge Instructions (Signed)
Please call your doctor for a followup appointment within 24-48 hours. When you talk to your doctor please let them know that you were seen in the emergency department and have them acquire all of your records so that they can discuss the findings with you and formulate a treatment plan to fully care for your new and ongoing problems. Please call and set-up an appointment with primary care provider Please rest and stay hydrated  Please follow up with Ear, nose, and throat physician  Please take antibiotics as prescribed  Please continue to monitor sugar levels Please continue to monitor symptoms closely and if symptoms are to worsen or change (fever greater than 101, chills, sweating, nausea, vomiting, chest pain, shortness of breathe, difficulty breathing, weakness, numbness, tingling, worsening or changes to pain pattern, increased swelling, tongue swelling, sensation of tongue pushing up to the roof of your mouth, headache, dizziness, inability to keep food and fluids down, diarrhea, stomach pain, neck pain, neck stiffness, neck swelling) please report back to the Emergency Department immediately.   Lymphadenopathy Lymphadenopathy means "disease of the lymph glands." But the term is usually used to describe swollen or enlarged lymph glands, also called lymph nodes. These are the bean-shaped organs found in many locations including the neck, underarm, and groin. Lymph glands are part of the immune system, which fights infections in your body. Lymphadenopathy can occur in just one area of the body, such as the neck, or it can be generalized, with lymph node enlargement in several areas. The nodes found in the neck are the most common sites of lymphadenopathy. CAUSES When your immune system responds to germs (such as viruses or bacteria ), infection-fighting cells and fluid build up. This causes the glands to grow in size. Usually, this is not something to worry about. Sometimes, the glands themselves can  become infected and inflamed. This is called lymphadenitis. Enlarged lymph nodes can be caused by many diseases:  Bacterial disease, such as strep throat or a skin infection.  Viral disease, such as a common cold.  Other germs, such as Lyme disease, tuberculosis, or sexually transmitted diseases.  Cancers, such as lymphoma (cancer of the lymphatic system) or leukemia (cancer of the white blood cells).  Inflammatory diseases such as lupus or rheumatoid arthritis.  Reactions to medications. Many of the diseases above are rare, but important. This is why you should see your caregiver if you have lymphadenopathy. SYMPTOMS  Swollen, enlarged lumps in the neck, back of the head, or other locations.  Tenderness.  Warmth or redness of the skin over the lymph nodes.  Fever. DIAGNOSIS Enlarged lymph nodes are often near the source of infection. They can help health care providers diagnose your illness. For instance:  Swollen lymph nodes around the jaw might be caused by an infection in the mouth.  Enlarged glands in the neck often signal a throat infection.  Lymph nodes that are swollen in more than one area often indicate an illness caused by a virus. Your caregiver will likely know what is causing your lymphadenopathy after listening to your history and examining you. Blood tests, x-rays, or other tests may be needed. If the cause of the enlarged lymph node cannot be found, and it does not go away by itself, then a biopsy may be needed. Your caregiver will discuss this with you. TREATMENT Treatment for your enlarged lymph nodes will depend on the cause. Many times the nodes will shrink to normal size by themselves, with no treatment. Antibiotics or other  medicines may be needed for infection. Only take over-the-counter or prescription medicines for pain, discomfort, or fever as directed by your caregiver. HOME CARE INSTRUCTIONS Swollen lymph glands usually return to normal when the  underlying medical condition goes away. If they persist, contact your health-care provider. He/she might prescribe antibiotics or other treatments, depending on the diagnosis. Take any medications exactly as prescribed. Keep any follow-up appointments made to check on the condition of your enlarged nodes. SEEK MEDICAL CARE IF:  Swelling lasts for more than two weeks.  You have symptoms such as weight loss, night sweats, fatigue, or fever that does not go away.  The lymph nodes are hard, seem fixed to the skin, or are growing rapidly.  Skin over the lymph nodes is red and inflamed. This could mean there is an infection. SEEK IMMEDIATE MEDICAL CARE IF:  Fluid starts leaking from the area of the enlarged lymph node.  You develop a fever of 102 F (38.9 C) or greater.  Severe pain develops (not necessarily at the site of a large lymph node).  You develop chest pain or shortness of breath.  You develop worsening abdominal pain. MAKE SURE YOU:  Understand these instructions.  Will watch your condition.  Will get help right away if you are not doing well or get worse. Document Released: 05/26/2008 Document Revised: 01/01/2014 Document Reviewed: 05/26/2008 [facility] Patient Information 2015 [place], [place]. This information is not intended to replace advice given to you by your health care provider. Make sure you discuss any questions you have with your health care provider.

## 2015-01-09 ENCOUNTER — Encounter: Payer: Self-pay | Admitting: Internal Medicine

## 2015-01-09 ENCOUNTER — Ambulatory Visit (INDEPENDENT_AMBULATORY_CARE_PROVIDER_SITE_OTHER): Payer: Medicare Other | Admitting: Internal Medicine

## 2015-01-09 ENCOUNTER — Other Ambulatory Visit: Payer: Medicare Other

## 2015-01-09 VITALS — BP 106/68 | HR 78 | Wt 118.2 lb

## 2015-01-09 DIAGNOSIS — E108 Type 1 diabetes mellitus with unspecified complications: Secondary | ICD-10-CM

## 2015-01-09 DIAGNOSIS — Z205 Contact with and (suspected) exposure to viral hepatitis: Secondary | ICD-10-CM

## 2015-01-09 DIAGNOSIS — K3184 Gastroparesis: Principal | ICD-10-CM

## 2015-01-09 DIAGNOSIS — E1143 Type 2 diabetes mellitus with diabetic autonomic (poly)neuropathy: Secondary | ICD-10-CM | POA: Diagnosis not present

## 2015-01-09 DIAGNOSIS — K5900 Constipation, unspecified: Secondary | ICD-10-CM

## 2015-01-09 DIAGNOSIS — R112 Nausea with vomiting, unspecified: Secondary | ICD-10-CM | POA: Diagnosis not present

## 2015-01-09 DIAGNOSIS — E780 Pure hypercholesterolemia: Secondary | ICD-10-CM | POA: Diagnosis not present

## 2015-01-09 DIAGNOSIS — E039 Hypothyroidism, unspecified: Secondary | ICD-10-CM | POA: Diagnosis not present

## 2015-01-09 DIAGNOSIS — G609 Hereditary and idiopathic neuropathy, unspecified: Secondary | ICD-10-CM | POA: Diagnosis not present

## 2015-01-09 DIAGNOSIS — E109 Type 1 diabetes mellitus without complications: Secondary | ICD-10-CM | POA: Diagnosis not present

## 2015-01-09 DIAGNOSIS — Z20828 Contact with and (suspected) exposure to other viral communicable diseases: Secondary | ICD-10-CM | POA: Diagnosis not present

## 2015-01-09 MED ORDER — DRONABINOL 2.5 MG PO CAPS: 2.5000 mg | ORAL_CAPSULE | Freq: Two times a day (BID) | ORAL | Status: DC

## 2015-01-09 MED ORDER — PROMETHAZINE HCL 25 MG PO TABS: 25.0000 mg | ORAL_TABLET | Freq: Four times a day (QID) | ORAL | Status: DC | PRN

## 2015-01-09 MED ORDER — METOCLOPRAMIDE HCL 10 MG PO TABS: 10.0000 mg | ORAL_TABLET | ORAL | Status: DC

## 2015-01-09 MED ORDER — DRONABINOL 5 MG PO CAPS: 5.0000 mg | ORAL_CAPSULE | Freq: Two times a day (BID) | ORAL | Status: DC

## 2015-01-09 NOTE — Progress Notes (Signed)
Subjective:    Patient ID: Karen Hayden, female    DOB: 15-Mar-1976, 39 y.o.   MRN: [id number]  HPI Karen Hayden is a 39 year old female with past medical history of type 1 diabetes, diabetic gastroparesis, hypertension, hyperlipidemia and seizure disorder who is here for follow-up. She was last seen in September 2015. She's been taking Marinol 2.5 mg twice daily to help with nausea and appetite. She reports she is making it though at times "barely". She is trying hard to maintain her weight but is having frequent morning nausea. She reports she eats very very light meals in the morning but usually after lunch is able to eat more regularly. This makes controlling her sugar somewhat difficult though she recently was told her A1c is down to 8.8 which she is proud of. She is using Phenergan 12.5 mg as needed and requests a higher dose of possible. Again the mornings is when she has the most nausea and will have vomiting as much as 2 days per week. Last vomiting episode was 3 days ago in the morning. Stable weight but she works hard to maintain weight. She feels like she has a "permanent morning sickness". She reports her daughter who has recently been imprisoned is now back home and overall this is a good situation. Her daughter was recently diagnosed with hep C and she is nervous about possible exposure as in the past her daughter has used her shaving razor. She was using Florastor but reports it is very expensive. Bowel movements have been less frequent recently occurring every 2 days when prior she was having 1-2 bowel movements daily. She has been on continuous clindamycin since early April for a dental abscess and infection. She has approximately one month left. Her bowel habits changed around the time she began clindamycin.  By our scales weight is up 9 pounds since being seen in September  Review of Systems As per history of present illness, otherwise negative  Current Medications, Allergies, Past Medical  History, Past Surgical History, Family History and Social History were reviewed in [employer] [employer] record.     Objective:   Physical Exam BP 106/68 mmHg  Pulse 78  Wt 118 lb 3.2 oz (53.615 kg) Constitutional: Well-developed and well-nourished. No distress. HEENT: Normocephalic and atraumatic. Oropharynx is clear and moist. No oropharyngeal exudate. Conjunctivae are normal.  No scleral icterus. Neck: Neck supple. Trachea midline. Cardiovascular: Normal rate, regular rhythm and intact distal pulses. No M/R/G Pulmonary/chest: Effort normal and breath sounds normal. No wheezing, rales or rhonchi. Abdominal: Soft, nontender, nondistended. Bowel sounds active throughout. Insulin pump left lower abdomen, 2 navel piercings. Extremities: no clubbing, cyanosis, or edema Lymphadenopathy: No cervical adenopathy noted. Neurological: Alert and oriented to person place and time. Skin: Skin is warm and dry. No rashes noted. Multiple tattoos Psychiatric: Normal mood and affect. Behavior is normal.  CBC    Component Value Date/Time   WBC 10.9* 12/18/2014 1713   RBC 5.15* 12/18/2014 1713   HGB 14.4 12/18/2014 1713   HCT 42.6 12/18/2014 1713   PLT 333 12/18/2014 1713   MCV 82.7 12/18/2014 1713   MCH 28.0 12/18/2014 1713   MCHC 33.8 12/18/2014 1713   RDW 13.0 12/18/2014 1713   LYMPHSABS 2.1 12/18/2014 1713   MONOABS 0.9 12/18/2014 1713   EOSABS 0.2 12/18/2014 1713   BASOSABS 0.0 12/18/2014 1713   ] CMP     Component Value Date/Time   NA 139 12/18/2014 1713   K 4.1 12/18/2014 1713  CL 106 12/18/2014 1713   CO2 27 12/18/2014 1713   GLUCOSE 76 12/18/2014 1713   BUN 6 12/18/2014 1713   CREATININE 0.75 12/18/2014 1713   CALCIUM 9.6 12/18/2014 1713   PROT 7.2 12/18/2014 1713   ALBUMIN 4.3 12/18/2014 1713   AST 15 12/18/2014 1713   ALT 14 12/18/2014 1713   ALKPHOS 86 12/18/2014 1713   BILITOT 0.4 12/18/2014 1713   GFRNONAA >90 12/18/2014 1713   GFRAA >90 12/18/2014  1713       Assessment & Plan:  39 year old female with past medical history of type 1 diabetes, diabetic gastroparesis, hypertension, hyperlipidemia and seizure disorder who is here for follow-up.   1. Diabetic gastroparesis/nausea -- she is struggling most in the morning with nausea. For this reason I'm going to restart Reglan 10 mg at bedtime. If this is not enough she can at 10 mg each morning as well. Appetite stimulation has improved with Marinol but giving ongoing issues will increase to 5 mg twice daily. She can continue Phenergan on an as-needed basis for nausea. 25 mg every 6 hours as needed. Tight glucose control remains mainstay of therapy and she is improving this with improving A1c. Less nausea can lead to more consistent eating and thus insulin dosing  2. Type 1 diabetes -- followed closely by endocrine, see #1  3. Mild constipation -- certainly clindamycin could explain change in bowel habit. I've encouraged daily probiotic. No evidence of diarrhea which may suggest C. difficile in the setting of [place]  4. Hepatitis C exposure, possible -- hepatitis B and C antibody testing today

## 2015-01-09 NOTE — Patient Instructions (Addendum)
We have sent the following medications to your pharmacy for you to pick up at your convenience: Marinol 5 mg twice daily Reglan 10 mg every morning and every night Phenergan 25 mg every 6 hours as needed for nausea  Your physician has requested that you go to the basement for the following lab work before leaving today: Viral Hepatitis Panel  Please follow up with Dr Hilarie Fredrickson in 3 months.

## 2015-01-10 ENCOUNTER — Telehealth: Payer: Self-pay | Admitting: Internal Medicine

## 2015-01-10 LAB — HEPATITIS C ANTIBODY: HCV AB: NEGATIVE

## 2015-01-10 LAB — HEPATITIS B CORE ANTIBODY, TOTAL: Hep B Core Total Ab: NONREACTIVE

## 2015-01-10 LAB — HEPATITIS B SURFACE ANTIGEN: Hepatitis B Surface Ag: NEGATIVE

## 2015-01-10 LAB — HEPATITIS B SURFACE ANTIBODY,QUALITATIVE: HEP B S AB: NEGATIVE

## 2015-01-10 NOTE — Telephone Encounter (Signed)
Called [employer] [facility] (spoke to [place]) and advised that the answer to their question regarding chemo is "no".

## 2015-01-11 ENCOUNTER — Other Ambulatory Visit: Payer: Self-pay

## 2015-01-11 ENCOUNTER — Telehealth: Payer: Self-pay | Admitting: *Deleted

## 2015-01-11 DIAGNOSIS — Z23 Encounter for immunization: Secondary | ICD-10-CM

## 2015-01-11 DIAGNOSIS — K3184 Gastroparesis: Secondary | ICD-10-CM

## 2015-01-11 NOTE — Telephone Encounter (Signed)
Patient's insurance ([employer] Part D--phone [phone] fax [phone]) has denied prior authorization request for dronabinol 5 mg capsule; "request for dronabinol did not meet the prior authorization requirements. Dronabinol is approved by the FDA for the treatment of appetite loss due to AIDS and for preventing chemotherapy-induced nausea and vomiting. The indication provided, Nausea with vomiting, unspecified; gastroparesis, is not a medically accepted indication for this medications."   Dr Hilarie Fredrickson, please advise.Marland KitchenMarland KitchenMarland Kitchen

## 2015-01-14 ENCOUNTER — Ambulatory Visit (INDEPENDENT_AMBULATORY_CARE_PROVIDER_SITE_OTHER): Payer: Medicare Other | Admitting: Internal Medicine

## 2015-01-14 DIAGNOSIS — Z23 Encounter for immunization: Secondary | ICD-10-CM

## 2015-01-14 DIAGNOSIS — Z205 Contact with and (suspected) exposure to viral hepatitis: Secondary | ICD-10-CM

## 2015-01-14 DIAGNOSIS — K3184 Gastroparesis: Secondary | ICD-10-CM

## 2015-01-14 NOTE — Telephone Encounter (Signed)
Patient does not meet either of these 2 indications, previously had been paying out of pocket which remains an option

## 2015-01-14 NOTE — Telephone Encounter (Signed)
Patient has been advised of information below. She is already aware insurance will not pay for rx. She is willing to continue paying out of pocket (with increased dosage, patient is now having to pay $340 out of pocket per month).

## 2015-02-15 ENCOUNTER — Encounter (INDEPENDENT_AMBULATORY_CARE_PROVIDER_SITE_OTHER): Payer: Self-pay

## 2015-02-15 ENCOUNTER — Ambulatory Visit (INDEPENDENT_AMBULATORY_CARE_PROVIDER_SITE_OTHER): Payer: Medicare Other | Admitting: Internal Medicine

## 2015-02-15 DIAGNOSIS — Z23 Encounter for immunization: Secondary | ICD-10-CM

## 2015-04-04 ENCOUNTER — Other Ambulatory Visit: Payer: Self-pay | Admitting: Internal Medicine

## 2015-04-18 ENCOUNTER — Encounter: Payer: Self-pay | Admitting: Internal Medicine

## 2015-04-18 ENCOUNTER — Ambulatory Visit (INDEPENDENT_AMBULATORY_CARE_PROVIDER_SITE_OTHER): Payer: Medicare Other | Admitting: Internal Medicine

## 2015-04-18 VITALS — BP 98/60 | HR 80 | Ht 61.0 in | Wt 121.0 lb

## 2015-04-18 DIAGNOSIS — K3184 Gastroparesis: Principal | ICD-10-CM

## 2015-04-18 DIAGNOSIS — E1143 Type 2 diabetes mellitus with diabetic autonomic (poly)neuropathy: Secondary | ICD-10-CM | POA: Diagnosis not present

## 2015-04-18 MED ORDER — PROMETHAZINE HCL 25 MG PO TABS: ORAL_TABLET | ORAL | Status: DC

## 2015-04-18 NOTE — Patient Instructions (Addendum)
You have been given a separate informational sheet regarding your tobacco use, the importance of quitting and local resources to help you quit.  Continue Marinol 5 mg twice a day.  Relgan 10 mg at bedtime Take Promethazine 25 mg every 6 hours, we will send in another prescription Restart a probiotic daily such as Activia or Align.   Please call back for a follow-up with Dr.Pyrtle in 9 months unless needed sooner.

## 2015-04-18 NOTE — Progress Notes (Signed)
Subjective:    Patient ID: Karen Hayden, female    DOB: Jun 28, 1976, 39 y.o.   MRN: [id number]  HPI Karen Hayden is a 39 year old female with past medical history of type 1 diabetes, diabetic gastroparesis, hypertension, hyperlipidemia and seizure disorder who is here for follow-up. She was last seen on 01/09/2015. She is feeling much better than when she was last seen in clinic. She reports the Reglan 10 mg added at night has been a life saver. She is not waking up with nausea and she is able to eat a full breakfast. She reports she continues Marinol 5 mg twice daily. She is using Phenergan on an as-needed basis but not necessarily daily. Being able to eat and control nausea has been a Therapist, sports for her recently. She is very pleased. Blood sugars have been much easier to control with more consistent diet. She states he is having more lows denies but has not had dangerous low sugars recently. She is seeing endocrinology tomorrow and expects her A1c to be in the 7 range. Energy level has been very good. Situation with her daughter has improved. She is living with her daughter and her daughter is expecting a child later in the year. Bowel movements have been somewhat irregular and at times she feels bilateral lower quadrant discomfort which comes and goes. She's been less consistent with her probiotic yogurt and notes more consistent bowel habits when she is taking it. Weight is up 3 pounds since last visit  Review of Systems  as per history of present illness, otherwise negative   Current Medications, Allergies, Past Medical History, Past Surgical History, Family History and Social History were reviewed in [employer] record.      Objective:   Physical Exam BP 98/60 mmHg  Pulse 80  Ht 5\' 1"  (1.549 m)  Wt 121 lb (54.885 kg)  BMI 22.87 kg/m2 Constitutional: Well-developed and well-nourished. No distress. HEENT: Normocephalic and atraumatic. Oropharynx is clear and moist. No  oropharyngeal exudate. Conjunctivae are normal.  No scleral icterus. Neck: Neck supple. Trachea midline. Cardiovascular: Normal rate, regular rhythm and intact distal pulses. No M/R/G Pulmonary/chest: Effort normal and breath sounds normal. No wheezing, rales or rhonchi. Abdominal: Soft, nontender, nondistended. Bowel sounds active throughout. There are no masses palpable. Insulin pump in place right lower quadrant Extremities: no clubbing, cyanosis, or edema Neurological: Alert and oriented to person place and time. Skin: Skin is warm and dry. No rashes noted. Psychiatric: Normal mood and affect. Behavior is normal.     Assessment & Plan:    39 year old female with past medical history of type 1 diabetes, diabetic gastroparesis, hypertension, hyperlipidemia and seizure disorder who is here for follow-up.   1. Diabetic gastroparesis/nausea -- symptoms dramatically improved recently leading to tighter glucose control. Continue Marinol 5 mg twice daily. Continue metoclopramide 10 mg at bedtime. Promethazine can be used 25 mg every 6 hours on an as-needed basis for nausea. She can have refills on these medications on an ongoing basis. If she develops nausea or gastroparesis symptoms my plan would be to have her take Reglan every 6 hours to try to prevent full gastroparesis flare. She will call me if she feels like she is getting sick. I would like to see her back in 9 months, sooner if necessary   2. Erratic bowel movements  -- normalization when using probiotic, I encouraged her to resume. Sample of 7 days of align given   3. Type 1 diabetes  -- closely followed  by endocrine, she has an appointment tomorrow.   Turn in 9 months, sooner if necessary

## 2015-04-19 DIAGNOSIS — E109 Type 1 diabetes mellitus without complications: Secondary | ICD-10-CM | POA: Diagnosis not present

## 2015-04-19 DIAGNOSIS — E039 Hypothyroidism, unspecified: Secondary | ICD-10-CM | POA: Diagnosis not present

## 2015-04-19 DIAGNOSIS — E780 Pure hypercholesterolemia: Secondary | ICD-10-CM | POA: Diagnosis not present

## 2015-04-19 DIAGNOSIS — G609 Hereditary and idiopathic neuropathy, unspecified: Secondary | ICD-10-CM | POA: Diagnosis not present

## 2015-05-20 DIAGNOSIS — Z23 Encounter for immunization: Secondary | ICD-10-CM | POA: Diagnosis not present

## 2015-06-07 ENCOUNTER — Other Ambulatory Visit: Payer: Self-pay | Admitting: Internal Medicine

## 2015-06-15 ENCOUNTER — Encounter (HOSPITAL_BASED_OUTPATIENT_CLINIC_OR_DEPARTMENT_OTHER): Payer: Self-pay

## 2015-06-15 ENCOUNTER — Emergency Department (HOSPITAL_BASED_OUTPATIENT_CLINIC_OR_DEPARTMENT_OTHER)
Admission: EM | Admit: 2015-06-15 | Discharge: 2015-06-15 | Disposition: A | Discharge status: Home / Self Care | Payer: Medicare Other | Attending: Physician Assistant | Admitting: Physician Assistant

## 2015-06-15 DIAGNOSIS — E079 Disorder of thyroid, unspecified: Secondary | ICD-10-CM | POA: Diagnosis not present

## 2015-06-15 DIAGNOSIS — E1143 Type 2 diabetes mellitus with diabetic autonomic (poly)neuropathy: Secondary | ICD-10-CM | POA: Diagnosis not present

## 2015-06-15 DIAGNOSIS — N12 Tubulo-interstitial nephritis, not specified as acute or chronic: Secondary | ICD-10-CM | POA: Diagnosis not present

## 2015-06-15 DIAGNOSIS — G40909 Epilepsy, unspecified, not intractable, without status epilepticus: Secondary | ICD-10-CM | POA: Insufficient documentation

## 2015-06-15 DIAGNOSIS — Z79899 Other long term (current) drug therapy: Secondary | ICD-10-CM | POA: Insufficient documentation

## 2015-06-15 DIAGNOSIS — Z720 Tobacco use: Secondary | ICD-10-CM | POA: Diagnosis not present

## 2015-06-15 DIAGNOSIS — R319 Hematuria, unspecified: Secondary | ICD-10-CM | POA: Diagnosis present

## 2015-06-15 DIAGNOSIS — F319 Bipolar disorder, unspecified: Secondary | ICD-10-CM | POA: Insufficient documentation

## 2015-06-15 DIAGNOSIS — I10 Essential (primary) hypertension: Secondary | ICD-10-CM | POA: Insufficient documentation

## 2015-06-15 DIAGNOSIS — Z794 Long term (current) use of insulin: Secondary | ICD-10-CM | POA: Insufficient documentation

## 2015-06-15 LAB — URINALYSIS, ROUTINE W REFLEX MICROSCOPIC
Bilirubin Urine: NEGATIVE
Glucose, UA: 100 mg/dL — AB
Hgb urine dipstick: NEGATIVE
KETONES UR: NEGATIVE mg/dL
Nitrite: POSITIVE — AB
PROTEIN: NEGATIVE mg/dL
Specific Gravity, Urine: 1.018 (ref 1.005–1.030)
Urobilinogen, UA: 0.2 mg/dL (ref 0.0–1.0)
pH: 7 (ref 5.0–8.0)

## 2015-06-15 LAB — CBC WITH DIFFERENTIAL/PLATELET
BASOS ABS: 0.1 10*3/uL (ref 0.0–0.1)
BASOS PCT: 1 %
EOS PCT: 3 %
Eosinophils Absolute: 0.3 10*3/uL (ref 0.0–0.7)
HEMATOCRIT: 40.8 % (ref 36.0–46.0)
HEMOGLOBIN: 14.1 g/dL (ref 12.0–15.0)
LYMPHS PCT: 13 %
Lymphs Abs: 1.3 10*3/uL (ref 0.7–4.0)
MCH: 28.3 pg (ref 26.0–34.0)
MCHC: 34.6 g/dL (ref 30.0–36.0)
MCV: 81.8 fL (ref 78.0–100.0)
MONO ABS: 0.7 10*3/uL (ref 0.1–1.0)
Monocytes Relative: 8 %
NEUTROS ABS: 7.5 10*3/uL (ref 1.7–7.7)
Neutrophils Relative %: 75 %
Platelets: 309 10*3/uL (ref 150–400)
RBC: 4.99 MIL/uL (ref 3.87–5.11)
RDW: 12.7 % (ref 11.5–15.5)
WBC: 9.8 10*3/uL (ref 4.0–10.5)

## 2015-06-15 LAB — BASIC METABOLIC PANEL
ANION GAP: 5 (ref 5–15)
BUN: 9 mg/dL (ref 6–20)
CHLORIDE: 106 mmol/L (ref 101–111)
CO2: 24 mmol/L (ref 22–32)
Calcium: 9 mg/dL (ref 8.9–10.3)
Creatinine, Ser: 0.68 mg/dL (ref 0.44–1.00)
GFR calc non Af Amer: >60 mL/min (ref >60)
Glucose, Bld: 221 mg/dL — ABNORMAL HIGH (ref 65–99)
Potassium: 4.1 mmol/L (ref 3.5–5.1)
Sodium: 135 mmol/L (ref 135–145)

## 2015-06-15 LAB — URINE MICROSCOPIC-ADD ON

## 2015-06-15 LAB — CBG MONITORING, ED: GLUCOSE-CAPILLARY: 202 mg/dL — AB (ref 65–99)

## 2015-06-15 MED ORDER — PHENAZOPYRIDINE HCL 100 MG PO TABS
200.0000 mg | ORAL_TABLET | Freq: Once | ORAL | Status: AC
  Administered 2015-06-15: 200 mg via ORAL
  Filled 2015-06-15: qty 2

## 2015-06-15 MED ORDER — DEXTROSE 5 % IV SOLN
1.0000 g | Freq: Once | INTRAVENOUS | Status: AC
  Administered 2015-06-15: 1 g via INTRAVENOUS

## 2015-06-15 MED ORDER — CEPHALEXIN 500 MG PO CAPS: 500.0000 mg | ORAL_CAPSULE | Freq: Three times a day (TID) | ORAL | Status: DC

## 2015-06-15 MED ORDER — MORPHINE SULFATE (PF) 4 MG/ML IV SOLN
4.0000 mg | Freq: Once | INTRAVENOUS | Status: AC
  Administered 2015-06-15: 4 mg via INTRAVENOUS
  Filled 2015-06-15: qty 1

## 2015-06-15 MED ORDER — CEFTRIAXONE SODIUM 1 G IJ SOLR
INTRAMUSCULAR | Status: AC
  Filled 2015-06-15: qty 10

## 2015-06-15 MED ORDER — SODIUM CHLORIDE 0.9 % IV BOLUS (SEPSIS)
1000.0000 mL | Freq: Once | INTRAVENOUS | Status: AC
  Administered 2015-06-15: 1000 mL via INTRAVENOUS

## 2015-06-15 NOTE — ED Notes (Signed)
Patient here with back pain x 1 week and awoke this am with hematuria. Patient concerned since she is diabetic

## 2015-06-15 NOTE — Discharge Instructions (Signed)
Take your antibiotics as directed and to completion. You should never have any leftover antibiotics! Push fluids and stay well hydrated.   Any antibiotic use can reduce the efficacy of hormonal birth control. Please use back up method of contraception.   Please follow with your primary care doctor in the next 2 days for a check-up. They must obtain records for further management.   Do not hesitate to return to the Emergency Department for any new, worsening or concerning symptoms.    Pyelonephritis, Adult Pyelonephritis is a kidney infection. The kidneys are the organs that filter a person's blood and move waste out of the bloodstream and into the urine. Urine passes from the kidneys, through the ureters, and into the bladder. There are two main types of pyelonephritis:  Infections that come on quickly without any warning (acute pyelonephritis).  Infections that last for a long period of time (chronic pyelonephritis). In most cases, the infection clears up with treatment and does not cause further problems. More severe infections or chronic infections can sometimes spread to the bloodstream or lead to other problems with the kidneys. CAUSES This condition is usually caused by:  Bacteria traveling from the bladder to the kidney through infected urine. The urine in the bladder can become infected with bacteria from:  Bladder infection (cystitis).  Inflammation of the prostate gland (prostatitis).  Sexual intercourse, in females.  Bacteria traveling from the bloodstream to the kidney. RISK FACTORS This condition is more likely to develop in:  Pregnant women.  Older people.  People who have diabetes.  People who have kidney stones or bladder stones.  People who have other abnormalities of the kidney or ureter.  People who have a catheter placed in the bladder.  People who have cancer.  People who are sexually active.  Women who use spermicides.  People who have had a  prior urinary tract infection. SYMPTOMS Symptoms of this condition include:  Frequent urination.  Strong or persistent urge to urinate.  Burning or stinging when urinating.  Abdominal pain.  Back pain.  Pain in the side or flank area.  Fever.  Chills.  Blood in the urine, or dark urine.  Nausea.  Vomiting. DIAGNOSIS This condition may be diagnosed based on:  Medical history and physical exam.  Urine tests.  Blood tests. You may also have imaging tests of the kidneys, such as an ultrasound or CT scan. TREATMENT Treatment for this condition may depend on the severity of the infection.  If the infection is mild and is found early, you may be treated with antibiotic medicines taken by mouth. You will need to drink fluids to remain hydrated.  If the infection is more severe, you may need to stay in the hospital and receive antibiotics given directly into a vein through an IV tube. You may also need to receive fluids through an IV tube if you are not able to remain hydrated. After your hospital stay, you may need to take oral antibiotics for a period of time. Other treatments may be required, depending on the cause of the infection. HOME CARE INSTRUCTIONS Medicines  Take over-the-counter and prescription medicines only as told by your health care provider.  If you were prescribed an antibiotic medicine, take it as told by your health care provider. Do not stop taking the antibiotic even if you start to feel better. General Instructions  Drink enough fluid to keep your urine clear or pale yellow.  Avoid caffeine, tea, and carbonated beverages. They tend to  irritate the bladder.  Urinate often. Avoid holding in urine for long periods of time.  Urinate before and after sex.  After a bowel movement, women should cleanse from front to back. Use each tissue only once.  Keep all follow-up visits as told by your health care provider. This is important. SEEK MEDICAL CARE  IF:  Your symptoms do not get better after 2 days of treatment.  Your symptoms get worse.  You have a fever. SEEK IMMEDIATE MEDICAL CARE IF:  You are unable to take your antibiotics or fluids.  You have shaking chills.  You vomit.  You have severe flank or back pain.  You have extreme weakness or fainting.   This information is not intended to replace advice given to you by your health care provider. Make sure you discuss any questions you have with your health care provider.   Document Released: 08/17/2005 Document Revised: 05/08/2015 Document Reviewed: 12/10/2014 Elsevier Interactive Patient Education [employer].

## 2015-06-15 NOTE — ED Provider Notes (Signed)
CSN: [id number]     Arrival date & time 06/15/15  1140 History   First MD Initiated Contact with Patient 06/15/15 1211     Chief Complaint  Patient presents with  . Hematuria     (Consider location/radiation/quality/duration/timing/severity/associated sxs/prior Treatment) HPI   Blood pressure 108/65, pulse 63, temperature 98.3 F (36.8 C), temperature source Oral, resp. rate 18, height 5\' 1"  (1.549 m), weight 122 lb (55.339 kg), SpO2 100 %.  Karen Hayden is a 39 y.o. female with past medical history significant for insulin-dependent diabetes complaining of hematuria onset 1 week ago associated with dysuria and urinary frequency which has largely resolved. Patient has right-sided low back and flank pain worsening over the course of several days, not alleviated with ibuprofen. Patient endorses tactile fever, chills. Denies nausea, vomiting, abnormal vaginal discharge, abdominal pain. States that her blood sugars have been running around 5-600 at home.  Past Medical History  Diagnosis Date  . DM (diabetes mellitus) ([place])   . Diabetic gastroparesis ([place])   . Seizure disorder ([place])   . Hypertension   . Umbilical hernia   . Hyperlipidemia   . Thyroid disease   . Gastroparesis     Due to diabetes   . Bipolar 1 disorder [facility])    Past Surgical History  Procedure Laterality Date  . Vaginal hysterectomy    . Tonsillectomy     Family History  Problem Relation Age of Onset  . Diabetes Mother   . Heart attack Mother   . Ovarian cancer Paternal Grandmother     great grandmother   Social History  Substance Use Topics  . Smoking status: Current Every Day Smoker -- 2.00 packs/day    Types: Cigarettes  . Smokeless tobacco: Never Used  . Alcohol Use: No   OB History    No data available     Review of Systems  10 systems reviewed and found to be negative, except as noted in the HPI.   Allergies  Lipitor and Codeine  Home Medications   Prior to Admission medications    Medication Sig Start Date End Date Taking? Authorizing Provider  cephALEXin (KEFLEX) 500 MG capsule Take 1 capsule (500 mg total) by mouth 3 (three) times daily. 06/15/15   Jalani Cullifer, PA-C  dronabinol (MARINOL) 5 MG capsule Take 1 capsule (5 mg total) by mouth 2 (two) times daily. 01/09/15   Jerene Bears, MD  gabapentin (NEURONTIN) 300 MG capsule Take 300 mg by mouth 3 (three) times daily.    Historical Provider, MD  glucose blood test strip  12/07/14   Historical Provider, MD  HUMALOG 100 UNIT/ML injection  11/22/14   Historical Provider, MD  Insulin Human (INSULIN PUMP) SOLN Inject into the skin continuous. humalog    Historical Provider, MD  levothyroxine (SYNTHROID, LEVOTHROID) 100 MCG tablet Take 100 mcg by mouth daily.    Historical Provider, MD  metoCLOPramide (REGLAN) 10 MG tablet Take 1 tablet (10 mg total) by mouth as directed. Take 1 tablet every morning and 1 tablet at bedtime 01/09/15   Jerene Bears, MD  promethazine (PHENERGAN) 25 MG tablet TAKE 1 TABLET BY MOUTH EVERY 6 HOURS AS NEEDED FOR NAUSEA AND VOMITING 06/07/15   Jerene Bears, MD   BP 107/50 mmHg  Pulse 62  Temp(Src) 98.3 F (36.8 C) (Oral)  Resp 18  Ht 5\' 1"  (1.549 m)  Wt 122 lb (55.339 kg)  BMI 23.06 kg/m2  SpO2 100% Physical Exam  Constitutional: She is oriented  to person, place, and time. She appears well-developed and well-nourished. No distress.  HENT:  Head: Normocephalic.  Mouth/Throat: Oropharynx is clear and moist.  Eyes: Conjunctivae and EOM are normal.  Neck: Normal range of motion.  Cardiovascular: Normal rate.   Pulmonary/Chest: Effort normal. No stridor.  Abdominal: Soft. Bowel sounds are normal. She exhibits no distension and no mass. There is no tenderness. There is no rebound and no guarding.  Genitourinary:  Mild right CVA tenderness to percussion   Musculoskeletal: Normal range of motion.  Neurological: She is alert and oriented to person, place, and time.  Psychiatric: She has a normal  mood and affect.  Nursing note and vitals reviewed.   ED Course  Procedures (including critical care time) Labs Review Labs Reviewed  URINALYSIS, ROUTINE W REFLEX MICROSCOPIC (NOT AT [facility]) - Abnormal; Notable for the following:    APPearance CLOUDY (*)    Glucose, UA 100 (*)    Nitrite POSITIVE (*)    Leukocytes, UA MODERATE (*)    All other components within normal limits  URINE MICROSCOPIC-ADD ON - Abnormal; Notable for the following:    Squamous Epithelial / LPF MANY (*)    Bacteria, UA MANY (*)    All other components within normal limits  BASIC METABOLIC PANEL - Abnormal; Notable for the following:    Glucose, Bld 221 (*)    All other components within normal limits  CBG MONITORING, ED - Abnormal; Notable for the following:    Glucose-Capillary 202 (*)    All other components within normal limits  URINE CULTURE  CBC WITH DIFFERENTIAL/PLATELET    Imaging Review No results found. I have personally reviewed and evaluated these images and lab results as part of my medical decision-making.   EKG Interpretation None      MDM   Final diagnoses:  Pyelonephritis    Filed Vitals:   06/15/15 1245 06/15/15 1300 06/15/15 1330 06/15/15 1400  BP: 101/55 100/68 106/72 107/50  Pulse: 69 64 61 62  Temp:      TempSrc:      Resp:      Height:      Weight:      SpO2: 100% 100% 100% 100%    Medications  sodium chloride 0.9 % bolus 1,000 mL (1,000 mLs Intravenous New Bag/Given 06/15/15 1308)  cefTRIAXone (ROCEPHIN) 1 g in dextrose 5 % 50 mL IVPB (0 g Intravenous Stopped 06/15/15 1356)  phenazopyridine (PYRIDIUM) tablet 200 mg (200 mg Oral Given 06/15/15 1308)  morphine 4 MG/ML injection 4 mg (4 mg Intravenous Given 06/15/15 1309)  cefTRIAXone (ROCEPHIN) 1 G injection (  Duplicate 07/86/75 4492)    Karen Hayden is 39 y.o. female diabetic complaining of dysuria, hematuria and right-sided flank pain. She also endorses tactile fever at home, she is afebrile in the ED. Mild  right CVA tenderness, UA is contaminated but consistent with infection. Patient will be given IV, IV Rocephin, check basic blood work and pain meds. Urinalysis with no ketones. No leukocytosis, normal renal function.  We'll treat for a brewing pyelonephritis  Evaluation does not show pathology that would require ongoing emergent intervention or inpatient treatment. Pt is hemodynamically stable and mentating appropriately. Discussed findings and plan with patient/guardian, who agrees with care plan. All questions answered. Return precautions discussed and outpatient follow up given.   New Prescriptions   CEPHALEXIN (KEFLEX) 500 MG CAPSULE    Take 1 capsule (500 mg total) by mouth 3 (three) times daily.  Monico Blitz, PA-C 06/15/15 [place], MD 06/16/15 [phone]

## 2015-06-15 NOTE — ED Notes (Signed)
Pt has insulin pump, was told CBG result, self administered insulin via the pump.

## 2015-06-18 LAB — URINE CULTURE

## 2015-06-19 ENCOUNTER — Telehealth (HOSPITAL_COMMUNITY): Payer: Self-pay

## 2015-06-19 NOTE — Telephone Encounter (Signed)
Post ED Visit - Positive Culture Follow-up  Culture report reviewed by antimicrobial stewardship pharmacist:  []  Heide Guile, Pharm.D., BCPS []  Alycia Rossetti, Pharm.D., BCPS []  [place], Pharm.D., BCPS, AAHIVP []  Legrand Como, Pharm.D., BCPS, AAHIVP []  [place], Pharm.D. [x]  Hanley Ben, [place].D.  Positive urine culture Treated with cephalexin, organism sensitive to the same and no further patient follow-up is required at this time.  Ileene Musa 06/19/2015, 9:25 AM

## 2015-06-21 ENCOUNTER — Emergency Department (HOSPITAL_BASED_OUTPATIENT_CLINIC_OR_DEPARTMENT_OTHER): Payer: Medicare Other

## 2015-06-21 ENCOUNTER — Emergency Department (HOSPITAL_BASED_OUTPATIENT_CLINIC_OR_DEPARTMENT_OTHER)
Admission: EM | Admit: 2015-06-21 | Discharge: 2015-06-21 | Disposition: A | Discharge status: Home / Self Care | Payer: Medicare Other | Attending: Emergency Medicine | Admitting: Emergency Medicine

## 2015-06-21 ENCOUNTER — Encounter (HOSPITAL_BASED_OUTPATIENT_CLINIC_OR_DEPARTMENT_OTHER): Payer: Self-pay | Admitting: *Deleted

## 2015-06-21 DIAGNOSIS — G40909 Epilepsy, unspecified, not intractable, without status epilepticus: Secondary | ICD-10-CM | POA: Diagnosis not present

## 2015-06-21 DIAGNOSIS — Z720 Tobacco use: Secondary | ICD-10-CM | POA: Diagnosis not present

## 2015-06-21 DIAGNOSIS — F319 Bipolar disorder, unspecified: Secondary | ICD-10-CM | POA: Diagnosis not present

## 2015-06-21 DIAGNOSIS — Z794 Long term (current) use of insulin: Secondary | ICD-10-CM | POA: Insufficient documentation

## 2015-06-21 DIAGNOSIS — Z792 Long term (current) use of antibiotics: Secondary | ICD-10-CM | POA: Insufficient documentation

## 2015-06-21 DIAGNOSIS — E079 Disorder of thyroid, unspecified: Secondary | ICD-10-CM | POA: Insufficient documentation

## 2015-06-21 DIAGNOSIS — M62838 Other muscle spasm: Secondary | ICD-10-CM | POA: Diagnosis not present

## 2015-06-21 DIAGNOSIS — R319 Hematuria, unspecified: Secondary | ICD-10-CM | POA: Diagnosis not present

## 2015-06-21 DIAGNOSIS — Z79899 Other long term (current) drug therapy: Secondary | ICD-10-CM | POA: Insufficient documentation

## 2015-06-21 DIAGNOSIS — R39198 Other difficulties with micturition: Secondary | ICD-10-CM | POA: Insufficient documentation

## 2015-06-21 DIAGNOSIS — E119 Type 2 diabetes mellitus without complications: Secondary | ICD-10-CM | POA: Insufficient documentation

## 2015-06-21 DIAGNOSIS — Z8719 Personal history of other diseases of the digestive system: Secondary | ICD-10-CM | POA: Diagnosis not present

## 2015-06-21 DIAGNOSIS — R109 Unspecified abdominal pain: Secondary | ICD-10-CM | POA: Diagnosis not present

## 2015-06-21 DIAGNOSIS — I10 Essential (primary) hypertension: Secondary | ICD-10-CM | POA: Diagnosis not present

## 2015-06-21 DIAGNOSIS — R52 Pain, unspecified: Secondary | ICD-10-CM

## 2015-06-21 LAB — CBC WITH DIFFERENTIAL/PLATELET
BASOS ABS: 0.1 10*3/uL (ref 0.0–0.1)
BASOS PCT: 1 %
EOS ABS: 0.3 10*3/uL (ref 0.0–0.7)
Eosinophils Relative: 3 %
HEMATOCRIT: 42 % (ref 36.0–46.0)
HEMOGLOBIN: 14.5 g/dL (ref 12.0–15.0)
Lymphocytes Relative: 27 %
Lymphs Abs: 2.8 10*3/uL (ref 0.7–4.0)
MCH: 27.9 pg (ref 26.0–34.0)
MCHC: 34.5 g/dL (ref 30.0–36.0)
MCV: 80.9 fL (ref 78.0–100.0)
Monocytes Absolute: 1.2 10*3/uL — ABNORMAL HIGH (ref 0.1–1.0)
Monocytes Relative: 11 %
NEUTROS ABS: 6.1 10*3/uL (ref 1.7–7.7)
NEUTROS PCT: 58 %
Platelets: 351 10*3/uL (ref 150–400)
RBC: 5.19 MIL/uL — AB (ref 3.87–5.11)
RDW: 12.4 % (ref 11.5–15.5)
WBC: 10.5 10*3/uL (ref 4.0–10.5)

## 2015-06-21 LAB — URINE MICROSCOPIC-ADD ON

## 2015-06-21 LAB — URINALYSIS, ROUTINE W REFLEX MICROSCOPIC
Bilirubin Urine: NEGATIVE
GLUCOSE, UA: 250 mg/dL — AB
HGB URINE DIPSTICK: NEGATIVE
KETONES UR: NEGATIVE mg/dL
Nitrite: NEGATIVE
PH: 6 (ref 5.0–8.0)
Protein, ur: NEGATIVE mg/dL
SPECIFIC GRAVITY, URINE: 1.01 (ref 1.005–1.030)
Urobilinogen, UA: 0.2 mg/dL (ref 0.0–1.0)

## 2015-06-21 LAB — BASIC METABOLIC PANEL
ANION GAP: 8 (ref 5–15)
BUN: 14 mg/dL (ref 6–20)
CHLORIDE: 107 mmol/L (ref 101–111)
CO2: 21 mmol/L — ABNORMAL LOW (ref 22–32)
CREATININE: 0.8 mg/dL (ref 0.44–1.00)
Calcium: 9.3 mg/dL (ref 8.9–10.3)
GFR calc non Af Amer: >60 mL/min (ref >60)
Glucose, Bld: 213 mg/dL — ABNORMAL HIGH (ref 65–99)
POTASSIUM: 3.7 mmol/L (ref 3.5–5.1)
SODIUM: 136 mmol/L (ref 135–145)

## 2015-06-21 MED ORDER — METHOCARBAMOL 1000 MG/10ML IJ SOLN
1000.0000 mg | Freq: Once | INTRAMUSCULAR | Status: AC
  Administered 2015-06-21: 1000 mg via INTRAVENOUS
  Filled 2015-06-21: qty 10

## 2015-06-21 MED ORDER — PHENAZOPYRIDINE HCL 200 MG PO TABS: 200.0000 mg | ORAL_TABLET | Freq: Three times a day (TID) | ORAL | Status: DC

## 2015-06-21 MED ORDER — METHOCARBAMOL 500 MG PO TABS: 500.0000 mg | ORAL_TABLET | Freq: Two times a day (BID) | ORAL | Status: DC

## 2015-06-21 MED ORDER — KETOROLAC TROMETHAMINE 30 MG/ML IJ SOLN
30.0000 mg | Freq: Once | INTRAMUSCULAR | Status: AC
  Administered 2015-06-21: 30 mg via INTRAVENOUS
  Filled 2015-06-21: qty 1

## 2015-06-21 MED ORDER — MELOXICAM 7.5 MG PO TABS: 7.5000 mg | ORAL_TABLET | Freq: Every day | ORAL | Status: DC

## 2015-06-21 NOTE — ED Provider Notes (Signed)
CSN: [id number]     Arrival date & time 06/21/15  0236 History   First MD Initiated Contact with Patient 06/21/15 0258     Chief Complaint  Patient presents with  . Flank Pain     (Consider location/radiation/quality/duration/timing/severity/associated sxs/prior Treatment) Patient is a 39 y.o. female presenting with flank pain. The history is provided by the patient.  Flank Pain This is a new problem. The current episode started yesterday. The problem occurs constantly. The problem has not changed since onset.Pertinent negatives include no chest pain and no shortness of breath. Nothing aggravates the symptoms. Nothing relieves the symptoms. She has tried nothing for the symptoms. The treatment provided mild relief.    Past Medical History  Diagnosis Date  . DM (diabetes mellitus) ([place])   . Diabetic gastroparesis ([place])   . Seizure disorder ([place])   . Hypertension   . Umbilical hernia   . Hyperlipidemia   . Thyroid disease   . Gastroparesis     Due to diabetes   . Bipolar 1 disorder [facility])    Past Surgical History  Procedure Laterality Date  . Vaginal hysterectomy    . Tonsillectomy     Family History  Problem Relation Age of Onset  . Diabetes Mother   . Heart attack Mother   . Ovarian cancer Paternal Grandmother     great grandmother   Social History  Substance Use Topics  . Smoking status: Current Every Day Smoker -- 2.00 packs/day    Types: Cigarettes  . Smokeless tobacco: Never Used  . Alcohol Use: No   OB History    No data available     Review of Systems  Constitutional: Negative for fever.  Respiratory: Negative for shortness of breath.   Cardiovascular: Negative for chest pain.  Genitourinary: Positive for flank pain and difficulty urinating.  All other systems reviewed and are negative.     Allergies  Lipitor and Codeine  Home Medications   Prior to Admission medications   Medication Sig Start Date End Date Taking? Authorizing Provider   cephALEXin (KEFLEX) 500 MG capsule Take 1 capsule (500 mg total) by mouth 3 (three) times daily. 06/15/15   Nicole Pisciotta, PA-C  dronabinol (MARINOL) 5 MG capsule Take 1 capsule (5 mg total) by mouth 2 (two) times daily. 01/09/15   Jerene Bears, MD  gabapentin (NEURONTIN) 300 MG capsule Take 300 mg by mouth 3 (three) times daily.    Historical Provider, MD  glucose blood test strip  12/07/14   Historical Provider, MD  HUMALOG 100 UNIT/ML injection  11/22/14   Historical Provider, MD  Insulin Human (INSULIN PUMP) SOLN Inject into the skin continuous. humalog    Historical Provider, MD  levothyroxine (SYNTHROID, LEVOTHROID) 100 MCG tablet Take 100 mcg by mouth daily.    Historical Provider, MD  metoCLOPramide (REGLAN) 10 MG tablet Take 1 tablet (10 mg total) by mouth as directed. Take 1 tablet every morning and 1 tablet at bedtime 01/09/15   Jerene Bears, MD  promethazine (PHENERGAN) 25 MG tablet TAKE 1 TABLET BY MOUTH EVERY 6 HOURS AS NEEDED FOR NAUSEA AND VOMITING 06/07/15   Jerene Bears, MD   There were no vitals taken for this visit. Physical Exam  Constitutional: She is oriented to person, place, and time. She appears well-developed and well-nourished. No distress.  HENT:  Head: Normocephalic and atraumatic.  Mouth/Throat: Oropharynx is clear and moist.  Eyes: Conjunctivae are normal. Pupils are equal, round, and reactive to light.  Neck: Normal range of motion. Neck supple.  Cardiovascular: Normal rate, regular rhythm and intact distal pulses.   Pulmonary/Chest: Effort normal and breath sounds normal. No respiratory distress. She has no wheezes. She has no rales.  Abdominal: Soft. Bowel sounds are normal. There is no tenderness. There is no rebound and no guarding.  Musculoskeletal: Normal range of motion.  Neurological: She is alert and oriented to person, place, and time.  Skin: Skin is warm and dry.  Psychiatric: She has a normal mood and affect.    ED Course  Procedures (including  critical care time) Labs Review Labs Reviewed  CBC WITH DIFFERENTIAL/PLATELET - Abnormal; Notable for the following:    RBC 5.19 (*)    Monocytes Absolute 1.2 (*)    All other components within normal limits  BASIC METABOLIC PANEL - Abnormal; Notable for the following:    CO2 21 (*)    Glucose, Bld 213 (*)    All other components within normal limits  URINALYSIS, ROUTINE W REFLEX MICROSCOPIC (NOT AT [facility])    Imaging Review No results found. I have personally reviewed and evaluated these images and lab results as part of my medical decision-making.   EKG Interpretation None      MDM   Final diagnoses:  Pain    Results for orders placed or performed during the hospital encounter of 06/21/15  Urinalysis, Routine w reflex microscopic (not at [facility])  Result Value Ref Range   Color, Urine YELLOW YELLOW   APPearance CLOUDY (A) CLEAR   Specific Gravity, Urine 1.010 1.005 - 1.030   pH 6.0 5.0 - 8.0   Glucose, UA 250 (A) NEGATIVE mg/dL   Hgb urine dipstick NEGATIVE NEGATIVE   Bilirubin Urine NEGATIVE NEGATIVE   Ketones, ur NEGATIVE NEGATIVE mg/dL   Protein, ur NEGATIVE NEGATIVE mg/dL   Urobilinogen, UA 0.2 0.0 - 1.0 mg/dL   Nitrite NEGATIVE NEGATIVE   Leukocytes, UA TRACE (A) NEGATIVE  CBC with Differential/Platelet  Result Value Ref Range   WBC 10.5 4.0 - 10.5 K/uL   RBC 5.19 (H) 3.87 - 5.11 MIL/uL   Hemoglobin 14.5 12.0 - 15.0 g/dL   HCT 42.0 36.0 - 46.0 %   MCV 80.9 78.0 - 100.0 fL   MCH 27.9 26.0 - 34.0 pg   MCHC 34.5 30.0 - 36.0 g/dL   RDW 12.4 11.5 - 15.5 %   Platelets 351 150 - 400 K/uL   Neutrophils Relative % 58 %   Neutro Abs 6.1 1.7 - 7.7 K/uL   Lymphocytes Relative 27 %   Lymphs Abs 2.8 0.7 - 4.0 K/uL   Monocytes Relative 11 %   Monocytes Absolute 1.2 (H) 0.1 - 1.0 K/uL   Eosinophils Relative 3 %   Eosinophils Absolute 0.3 0.0 - 0.7 K/uL   Basophils Relative 1 %   Basophils Absolute 0.1 0.0 - 0.1 K/uL  Basic metabolic panel  Result Value Ref Range    Sodium 136 135 - 145 mmol/L   Potassium 3.7 3.5 - 5.1 mmol/L   Chloride 107 101 - 111 mmol/L   CO2 21 (L) 22 - 32 mmol/L   Glucose, Bld 213 (H) 65 - 99 mg/dL   BUN 14 6 - 20 mg/dL   Creatinine, Ser 0.80 0.44 - 1.00 mg/dL   Calcium 9.3 8.9 - 10.3 mg/dL   GFR calc non Af Amer >60 >60 mL/min   GFR calc Af Amer >60 >60 mL/min   Anion gap 8 5 - 15  Urine microscopic-add  on  Result Value Ref Range   Squamous Epithelial / LPF FEW (A) RARE   WBC, UA 0-2 <3 WBC/hpf   RBC / HPF 0-2 <3 RBC/hpf   Bacteria, UA RARE RARE   Ct Renal Stone Study  06/21/2015  CLINICAL DATA:  Acute onset of right flank pain and hematuria. Initial encounter. EXAM: CT ABDOMEN AND PELVIS WITHOUT CONTRAST TECHNIQUE: Multidetector CT imaging of the abdomen and pelvis was performed following the standard protocol without IV contrast. COMPARISON:  CT of the abdomen and pelvis from 06/29/2013 FINDINGS: The visualized lung bases are clear. The liver and spleen are unremarkable in appearance. The gallbladder is within normal limits. The pancreas and adrenal glands are unremarkable. The kidneys are unremarkable in appearance. There is no evidence of hydronephrosis. No renal or ureteral stones are seen. No perinephric stranding is appreciated. No free fluid is identified. The small bowel is unremarkable in appearance. The stomach is within normal limits. No acute vascular abnormalities are seen. The appendix is normal in caliber and contains air without evidence of appendicitis. The colon is partially filled with stool and is unremarkable in appearance. The bladder is mildly distended and grossly unremarkable. The patient is status post hysterectomy. No suspicious adnexal masses are seen. No inguinal lymphadenopathy is seen. Metallic piercings are noted at the labia. No acute osseous abnormalities are identified. IMPRESSION: Unremarkable noncontrast CT of the abdomen and pelvis. Electronically Signed   By: Garald Balding M.D.   On: 06/21/2015  03:57   Suspect pain is muscle spasm.  Will treat with NSAIDs, robaxin.  Follow up with your PMD for ongoing care    Noralyn Karim, MD 06/21/15 [phone]

## 2015-06-21 NOTE — ED Notes (Signed)
Pt not in room, pt in CT.  

## 2015-06-21 NOTE — ED Notes (Signed)
Here last Saturday for the same, dx'd with kidney infection, taking keflex TID, pain increased last night, R flank >Lalso pelvic pain, nv, decreased urine output with urgency, last void 2230. H/o DM, wears an insulin pump, cbg 358 at 2330gave herself a unit and a half. Alert, NAD, restless, interactive.

## 2015-07-08 DIAGNOSIS — E7800 Pure hypercholesterolemia, unspecified: Secondary | ICD-10-CM | POA: Diagnosis not present

## 2015-07-08 DIAGNOSIS — E109 Type 1 diabetes mellitus without complications: Secondary | ICD-10-CM | POA: Diagnosis not present

## 2015-07-08 DIAGNOSIS — E039 Hypothyroidism, unspecified: Secondary | ICD-10-CM | POA: Diagnosis not present

## 2015-07-15 DIAGNOSIS — G609 Hereditary and idiopathic neuropathy, unspecified: Secondary | ICD-10-CM | POA: Diagnosis not present

## 2015-07-15 DIAGNOSIS — E039 Hypothyroidism, unspecified: Secondary | ICD-10-CM | POA: Diagnosis not present

## 2015-07-15 DIAGNOSIS — E7800 Pure hypercholesterolemia, unspecified: Secondary | ICD-10-CM | POA: Diagnosis not present

## 2015-07-15 DIAGNOSIS — E109 Type 1 diabetes mellitus without complications: Secondary | ICD-10-CM | POA: Diagnosis not present

## 2015-07-29 ENCOUNTER — Ambulatory Visit: Payer: Medicare Other | Admitting: Internal Medicine

## 2015-07-30 ENCOUNTER — Telehealth: Payer: Self-pay | Admitting: *Deleted

## 2015-07-30 NOTE — Telephone Encounter (Signed)
-----   Message from Larina Bras, Terry sent at 06/25/2015  8:36 AM EDT ----- Regarding: FW: 3rd Hep B injection reminder  Did pt have 3rd hep b on 11/28? If not, needs to have done ----- Message -----    From: Oda Kilts, CMA    Sent: 06/25/2015      To: Algernon Huxley, RN, Larina Bras, CMA Subject: 3rd Hep B injection reminder                   3rd Hep B injection due 07/18/2015

## 2015-07-30 NOTE — Telephone Encounter (Signed)
I have attempted to reach patient to reschedule 3rd Hepatitis B injection (she was scheduled for office visit yesterday but no showed). Her voicemail is full. I will attempt to reach patient at a later time.

## 2015-07-31 NOTE — Telephone Encounter (Signed)
I have spoken to patient who has scheduled 3rd hepatitis b for 08-02-15.

## 2015-08-02 ENCOUNTER — Ambulatory Visit (INDEPENDENT_AMBULATORY_CARE_PROVIDER_SITE_OTHER): Payer: Medicare Other | Admitting: Internal Medicine

## 2015-08-02 DIAGNOSIS — Z23 Encounter for immunization: Secondary | ICD-10-CM

## 2015-10-16 DIAGNOSIS — E039 Hypothyroidism, unspecified: Secondary | ICD-10-CM | POA: Diagnosis not present

## 2015-10-16 DIAGNOSIS — E109 Type 1 diabetes mellitus without complications: Secondary | ICD-10-CM | POA: Diagnosis not present

## 2015-10-16 DIAGNOSIS — G609 Hereditary and idiopathic neuropathy, unspecified: Secondary | ICD-10-CM | POA: Diagnosis not present

## 2015-10-16 DIAGNOSIS — N390 Urinary tract infection, site not specified: Secondary | ICD-10-CM | POA: Diagnosis not present

## 2015-10-16 DIAGNOSIS — E7800 Pure hypercholesterolemia, unspecified: Secondary | ICD-10-CM | POA: Diagnosis not present

## 2015-10-16 DIAGNOSIS — R300 Dysuria: Secondary | ICD-10-CM | POA: Diagnosis not present

## 2015-10-21 DIAGNOSIS — H5213 Myopia, bilateral: Secondary | ICD-10-CM | POA: Diagnosis not present

## 2015-10-21 DIAGNOSIS — E109 Type 1 diabetes mellitus without complications: Secondary | ICD-10-CM | POA: Diagnosis not present

## 2015-10-24 ENCOUNTER — Other Ambulatory Visit: Payer: Self-pay | Admitting: Internal Medicine

## 2015-12-11 DIAGNOSIS — E109 Type 1 diabetes mellitus without complications: Secondary | ICD-10-CM | POA: Diagnosis not present

## 2015-12-11 DIAGNOSIS — N390 Urinary tract infection, site not specified: Secondary | ICD-10-CM | POA: Diagnosis not present

## 2016-01-13 DIAGNOSIS — E039 Hypothyroidism, unspecified: Secondary | ICD-10-CM | POA: Diagnosis not present

## 2016-01-13 DIAGNOSIS — E7800 Pure hypercholesterolemia, unspecified: Secondary | ICD-10-CM | POA: Diagnosis not present

## 2016-01-13 DIAGNOSIS — R300 Dysuria: Secondary | ICD-10-CM | POA: Diagnosis not present

## 2016-01-13 DIAGNOSIS — G609 Hereditary and idiopathic neuropathy, unspecified: Secondary | ICD-10-CM | POA: Diagnosis not present

## 2016-01-13 DIAGNOSIS — E109 Type 1 diabetes mellitus without complications: Secondary | ICD-10-CM | POA: Diagnosis not present

## 2016-01-13 DIAGNOSIS — N390 Urinary tract infection, site not specified: Secondary | ICD-10-CM | POA: Diagnosis not present

## 2016-01-21 ENCOUNTER — Other Ambulatory Visit: Payer: Self-pay | Admitting: Internal Medicine

## 2016-03-12 ENCOUNTER — Encounter: Payer: Self-pay | Admitting: Internal Medicine

## 2016-03-12 ENCOUNTER — Ambulatory Visit (INDEPENDENT_AMBULATORY_CARE_PROVIDER_SITE_OTHER): Payer: Medicare Other | Admitting: Internal Medicine

## 2016-03-12 VITALS — BP 116/72 | HR 72 | Ht 62.75 in | Wt 123.0 lb

## 2016-03-12 DIAGNOSIS — K3184 Gastroparesis: Secondary | ICD-10-CM | POA: Diagnosis not present

## 2016-03-12 MED ORDER — METOCLOPRAMIDE HCL 10 MG PO TABS: ORAL_TABLET | ORAL | Status: DC

## 2016-03-12 MED ORDER — PROMETHAZINE HCL 25 MG PO TABS: 25.0000 mg | ORAL_TABLET | Freq: Four times a day (QID) | ORAL | Status: DC | PRN

## 2016-03-12 NOTE — Progress Notes (Signed)
Subjective:    Patient ID: Karen Hayden, female    DOB: 07/23/1976, 40 y.o.   MRN: [id number]  HPI Karen Hayden is a 40 year old female with a history of diabetic gastroparesis, type 1 diabetes, hypertension, hyperlipidemia and seizure disorder who is here for follow-up. She was last seen on 04/18/2015. She is here alone today. She reports that she has been doing fairly well recently though she has run out of her medications which is made her nausea worse. She is no longer able to afford Marinol as it is not covered by her new insurance. She's been using Reglan 10 mg at bedtime and promethazine 25 mg several times per day. With this her symptoms are well-controlled. She works hard to control her blood sugars and was able to decrease her A1c from 8.9-7.7. Recently her A1c bumped up slightly but she determined she was over correcting lows. She has made adjustments and this has improved. Bowel movements have been regular without blood in her stool or melena. No change in her bowel habits, constipation or diarrhea. She was having left lower quadrant pressure which has resolved. She does occasionally feel aching in her right lower quadrant. This is inconsistent and not related to bowel movement or eating. She rarely has vomiting. She denies dysphagia or odynophagia. Heartburn has not been a problem. Her weight has been stable.  She has started gabapentin 300 mg 3 times a day for neuropathic symptoms in her extremities. This has been helpful for her.  She reports that she has ended her marriage which has been a dramatic stress reducer for her. She has now living independently on her own and this is been great. Her daughter gave birth to her granddaughter on 08/19/2015. Her daughter has been able to "stay out of trouble" recently and she reports she is doing very well.   Review of Systems As per history of present illness, otherwise negative  Current Medications, Allergies, Past Medical History, Past  Surgical History, Family History and Social History were reviewed in [employer] record.     Objective:   Physical Exam BP 116/72 mmHg  Pulse 72  Ht 5' 2.75" (1.594 m)  Wt 123 lb (55.792 kg)  BMI 21.96 kg/m2 Constitutional: Well-developed and well-nourished. No distress. HEENT: Normocephalic and atraumatic. Oropharynx is clear and moist. No oropharyngeal exudate. Conjunctivae are normal.  No scleral icterus. Neck: Neck supple. Trachea midline. Cardiovascular: Normal rate, regular rhythm and intact distal pulses. No M/R/G Pulmonary/chest: Effort normal and breath sounds normal. No wheezing, rales or rhonchi. Abdominal: Soft, nontender, nondistended. Bowel sounds active throughout. There are no masses palpable. No hepatosplenomegaly.Insulin pump in place Extremities: no clubbing, cyanosis, or edema Lymphadenopathy: No cervical adenopathy noted. Neurological: Alert and oriented to person place and time. Skin: Skin is warm and dry. No rashes noted. Psychiatric: Normal mood and affect. Behavior is normal.     Assessment & Plan:  40 year old female with a history of diabetic gastroparesis, type 1 diabetes, hypertension, hyperlipidemia and seizure disorder who is here for follow-up.  1. Diabetic gastroparesis -- nausea is her primary symptom. She can no longer afford Marinol which previously had worked very well at 5 mg twice a day. She is having success with Reglan 10 mg daily at bedtime and as needed promethazine. She follows a gastroparesis diet and works hard to control her blood sugars. For now we will continue with Reglan 10 mg daily at bedtime and as needed promethazine. We again discussed the long-term risk of  Reglan but she can not currently afford domperidone. She wishes to proceed with this therapy and has full understanding of the very rare but potential side effects long-term for metoclopramide use  She will follow-up in one year, sooner if necessary 15  minutes spent with the patient today. Greater than 50% was spent in counseling and coordination of care with the patient

## 2016-03-12 NOTE — Patient Instructions (Signed)
We have sent the following medications to your pharmacy for you to pick up at your convenience: Reglan  Promethazine  Please follow up with Dr Hilarie Fredrickson in 1 year.  If you are age 41 or older, your body mass index should be between 23-30. Your Body mass index is 21.96 kg/(m^2). If this is out of the aforementioned range listed, please consider follow up with your Primary Care Provider.  If you are age 17 or younger, your body mass index should be between 19-25. Your Body mass index is 21.96 kg/(m^2). If this is out of the aformentioned range listed, please consider follow up with your Primary Care Provider.

## 2016-04-27 DIAGNOSIS — N390 Urinary tract infection, site not specified: Secondary | ICD-10-CM | POA: Diagnosis not present

## 2016-04-29 DIAGNOSIS — N390 Urinary tract infection, site not specified: Secondary | ICD-10-CM | POA: Diagnosis not present

## 2016-04-29 DIAGNOSIS — E7800 Pure hypercholesterolemia, unspecified: Secondary | ICD-10-CM | POA: Diagnosis not present

## 2016-04-29 DIAGNOSIS — E109 Type 1 diabetes mellitus without complications: Secondary | ICD-10-CM | POA: Diagnosis not present

## 2016-04-29 DIAGNOSIS — E039 Hypothyroidism, unspecified: Secondary | ICD-10-CM | POA: Diagnosis not present

## 2016-05-01 DIAGNOSIS — E039 Hypothyroidism, unspecified: Secondary | ICD-10-CM | POA: Diagnosis not present

## 2016-05-01 DIAGNOSIS — G609 Hereditary and idiopathic neuropathy, unspecified: Secondary | ICD-10-CM | POA: Diagnosis not present

## 2016-05-01 DIAGNOSIS — E7800 Pure hypercholesterolemia, unspecified: Secondary | ICD-10-CM | POA: Diagnosis not present

## 2016-05-01 DIAGNOSIS — E109 Type 1 diabetes mellitus without complications: Secondary | ICD-10-CM | POA: Diagnosis not present

## 2016-05-26 DIAGNOSIS — Z23 Encounter for immunization: Secondary | ICD-10-CM | POA: Diagnosis not present

## 2016-06-15 DIAGNOSIS — R351 Nocturia: Secondary | ICD-10-CM | POA: Diagnosis not present

## 2016-06-15 DIAGNOSIS — N202 Calculus of kidney with calculus of ureter: Secondary | ICD-10-CM | POA: Diagnosis not present

## 2016-06-15 DIAGNOSIS — N390 Urinary tract infection, site not specified: Secondary | ICD-10-CM | POA: Diagnosis not present

## 2016-06-15 DIAGNOSIS — R350 Frequency of micturition: Secondary | ICD-10-CM | POA: Diagnosis not present

## 2016-07-03 DIAGNOSIS — R350 Frequency of micturition: Secondary | ICD-10-CM | POA: Diagnosis not present

## 2016-07-03 DIAGNOSIS — R310 Gross hematuria: Secondary | ICD-10-CM | POA: Diagnosis not present

## 2016-07-03 DIAGNOSIS — N202 Calculus of kidney with calculus of ureter: Secondary | ICD-10-CM | POA: Diagnosis not present

## 2016-07-03 DIAGNOSIS — N390 Urinary tract infection, site not specified: Secondary | ICD-10-CM | POA: Diagnosis not present

## 2016-07-20 DIAGNOSIS — G609 Hereditary and idiopathic neuropathy, unspecified: Secondary | ICD-10-CM | POA: Diagnosis not present

## 2016-07-20 DIAGNOSIS — E7800 Pure hypercholesterolemia, unspecified: Secondary | ICD-10-CM | POA: Diagnosis not present

## 2016-07-20 DIAGNOSIS — E109 Type 1 diabetes mellitus without complications: Secondary | ICD-10-CM | POA: Diagnosis not present

## 2016-07-20 DIAGNOSIS — E039 Hypothyroidism, unspecified: Secondary | ICD-10-CM | POA: Diagnosis not present

## 2016-10-06 ENCOUNTER — Emergency Department (HOSPITAL_BASED_OUTPATIENT_CLINIC_OR_DEPARTMENT_OTHER): Payer: Medicare Other

## 2016-10-06 ENCOUNTER — Encounter (HOSPITAL_BASED_OUTPATIENT_CLINIC_OR_DEPARTMENT_OTHER): Payer: Self-pay

## 2016-10-06 ENCOUNTER — Emergency Department (HOSPITAL_BASED_OUTPATIENT_CLINIC_OR_DEPARTMENT_OTHER)
Admission: EM | Admit: 2016-10-06 | Discharge: 2016-10-06 | Disposition: A | Discharge status: Home / Self Care | Payer: Medicare Other | Attending: Emergency Medicine | Admitting: Emergency Medicine

## 2016-10-06 DIAGNOSIS — Z79899 Other long term (current) drug therapy: Secondary | ICD-10-CM | POA: Insufficient documentation

## 2016-10-06 DIAGNOSIS — N898 Other specified noninflammatory disorders of vagina: Secondary | ICD-10-CM | POA: Diagnosis not present

## 2016-10-06 DIAGNOSIS — R112 Nausea with vomiting, unspecified: Secondary | ICD-10-CM | POA: Diagnosis not present

## 2016-10-06 DIAGNOSIS — R102 Pelvic and perineal pain: Secondary | ICD-10-CM | POA: Diagnosis not present

## 2016-10-06 DIAGNOSIS — F17210 Nicotine dependence, cigarettes, uncomplicated: Secondary | ICD-10-CM | POA: Insufficient documentation

## 2016-10-06 DIAGNOSIS — K5900 Constipation, unspecified: Secondary | ICD-10-CM | POA: Insufficient documentation

## 2016-10-06 DIAGNOSIS — Z794 Long term (current) use of insulin: Secondary | ICD-10-CM | POA: Diagnosis not present

## 2016-10-06 DIAGNOSIS — R1031 Right lower quadrant pain: Secondary | ICD-10-CM | POA: Diagnosis present

## 2016-10-06 DIAGNOSIS — I10 Essential (primary) hypertension: Secondary | ICD-10-CM | POA: Diagnosis not present

## 2016-10-06 DIAGNOSIS — E109 Type 1 diabetes mellitus without complications: Secondary | ICD-10-CM | POA: Insufficient documentation

## 2016-10-06 DIAGNOSIS — R509 Fever, unspecified: Secondary | ICD-10-CM | POA: Insufficient documentation

## 2016-10-06 HISTORY — DX: Renal tubulo-interstitial disease, unspecified: N15.9

## 2016-10-06 LAB — COMPREHENSIVE METABOLIC PANEL
ALBUMIN: 4.2 g/dL (ref 3.5–5.0)
ALK PHOS: 79 U/L (ref 38–126)
ALT: 20 U/L (ref 14–54)
ANION GAP: 6 (ref 5–15)
AST: 24 U/L (ref 15–41)
BILIRUBIN TOTAL: 0.8 mg/dL (ref 0.3–1.2)
BUN: 7 mg/dL (ref 6–20)
CALCIUM: 9.3 mg/dL (ref 8.9–10.3)
CO2: 24 mmol/L (ref 22–32)
CREATININE: 0.67 mg/dL (ref 0.44–1.00)
Chloride: 105 mmol/L (ref 101–111)
GFR calc non Af Amer: >60 mL/min (ref >60)
GLUCOSE: 176 mg/dL — AB (ref 65–99)
Potassium: 4.2 mmol/L (ref 3.5–5.1)
SODIUM: 135 mmol/L (ref 135–145)
TOTAL PROTEIN: 6.9 g/dL (ref 6.5–8.1)

## 2016-10-06 LAB — CBC WITH DIFFERENTIAL/PLATELET
Basophils Absolute: 0.1 10*3/uL (ref 0.0–0.1)
Basophils Relative: 0 %
EOS ABS: 0.4 10*3/uL (ref 0.0–0.7)
Eosinophils Relative: 3 %
HCT: 38.6 % (ref 36.0–46.0)
HEMOGLOBIN: 13.4 g/dL (ref 12.0–15.0)
Lymphocytes Relative: 23 %
Lymphs Abs: 2.7 10*3/uL (ref 0.7–4.0)
MCH: 28.7 pg (ref 26.0–34.0)
MCHC: 34.7 g/dL (ref 30.0–36.0)
MCV: 82.7 fL (ref 78.0–100.0)
Monocytes Absolute: 0.9 10*3/uL (ref 0.1–1.0)
Monocytes Relative: 8 %
NEUTROS ABS: 7.4 10*3/uL (ref 1.7–7.7)
NEUTROS PCT: 66 %
Platelets: 364 10*3/uL (ref 150–400)
RBC: 4.67 MIL/uL (ref 3.87–5.11)
RDW: 12.9 % (ref 11.5–15.5)
WBC: 11.4 10*3/uL — AB (ref 4.0–10.5)

## 2016-10-06 LAB — URINALYSIS, ROUTINE W REFLEX MICROSCOPIC
Bilirubin Urine: NEGATIVE
GLUCOSE, UA: 250 mg/dL — AB
Hgb urine dipstick: NEGATIVE
Ketones, ur: NEGATIVE mg/dL
LEUKOCYTES UA: NEGATIVE
NITRITE: NEGATIVE
Protein, ur: NEGATIVE mg/dL
SPECIFIC GRAVITY, URINE: 1.005 (ref 1.005–1.030)
pH: 6.5 (ref 5.0–8.0)

## 2016-10-06 LAB — LIPASE, BLOOD: Lipase: 17 U/L (ref 11–51)

## 2016-10-06 LAB — CBG MONITORING, ED: Glucose-Capillary: 300 mg/dL — ABNORMAL HIGH (ref 65–99)

## 2016-10-06 MED ORDER — HYDROCODONE-ACETAMINOPHEN 5-325 MG PO TABS: 1.0000 | ORAL_TABLET | Freq: Four times a day (QID) | ORAL | 0 refills | Status: DC | PRN

## 2016-10-06 MED ORDER — ONDANSETRON 4 MG PO TBDP: ORAL_TABLET | ORAL | 0 refills | Status: DC

## 2016-10-06 MED ORDER — MORPHINE SULFATE (PF) 4 MG/ML IV SOLN
4.0000 mg | Freq: Once | INTRAVENOUS | Status: AC
  Administered 2016-10-06: 4 mg via INTRAVENOUS
  Filled 2016-10-06: qty 1

## 2016-10-06 MED ORDER — SODIUM CHLORIDE 0.9 % IV BOLUS (SEPSIS)
500.0000 mL | Freq: Once | INTRAVENOUS | Status: AC
  Administered 2016-10-06: 500 mL via INTRAVENOUS

## 2016-10-06 NOTE — ED Triage Notes (Signed)
C/o abd pain x 1-2 weeks-n/v, constipation,fever x today-NAD-steady gait

## 2016-10-06 NOTE — ED Notes (Signed)
ED Provider at bedside. 

## 2016-10-06 NOTE — ED Notes (Signed)
Pt drinking water without difficulty. PA made aware. Awaiting disposition.

## 2016-10-06 NOTE — ED Notes (Signed)
Patient transported to Ultrasound 

## 2016-10-06 NOTE — ED Provider Notes (Signed)
[facility] DEPT [facility] Provider Note   CSN: [id number] Arrival date & time: 10/06/16  [id number]   By signing my name below, I, Soijett Blue, attest that this documentation has been prepared under the direction and in the presence of Etta Quill, NP Electronically Signed: Soijett Blue, ED Scribe. 10/06/16. 8:05 PM.  History   Chief Complaint Chief Complaint  Patient presents with  . Abdominal Pain    HPI Karen Hayden is a 41 y.o. female with a PMHx of gastroparesis, DM, HTN, who presents to the Emergency Department complaining of right lower abdominal pain onset 1-2 weeks ago. Pt notes that her right lower abdominal pain radiates to her right lower back. Pt reports associated nausea, vomiting, constipation, low-grade fever x today, no vaginal discharge. Pt notes that she had a hysterectomy when she was 41 years old and she still has her right ovary. Pt has tried tylenol and heat with mild relief of her symptoms. She denies dysuria, vaginal bleeding, vaginal discharge, and any other symptoms.    The history is provided by the patient. No language interpreter was used.  Abdominal Pain   This is a new problem. The current episode started more than 1 week ago. Associated symptoms include fever, nausea, vomiting and constipation. Pertinent negatives include dysuria. The symptoms are aggravated by palpation. Nothing relieves the symptoms.    Past Medical History:  Diagnosis Date  . Bipolar 1 disorder ([place])   . Chronic kidney infection   . Diabetic gastroparesis ([place])   . DM (diabetes mellitus) ([place])   . Gastroparesis    Due to diabetes   . Hyperlipidemia   . Hypertension   . Seizure disorder ([place])   . Thyroid disease   . Umbilical hernia     Patient Active Problem List   Diagnosis Date Noted  . Protein-calorie malnutrition, severe ([place]) 01/29/2014  . Nausea & vomiting 01/26/2014  . Unspecified constipation 01/26/2014  . Cough 01/26/2014  . Tobacco use disorder 01/26/2014  . Left  sided abdominal pain 06/21/2013  . Gastroparesis 06/21/2013  . Nausea with vomiting 06/21/2013  . Other general symptoms(780.99)  06/21/2013  . Change in bowel habits 06/21/2013  . Diabetic gastroparesis ([place]) 06/04/2011  . DIABETES MELLITUS, TYPE I 01/04/2009  . Acute bronchitis 01/04/2009  . SEIZURE DISORDER 01/04/2009    Past Surgical History:  Procedure Laterality Date  . TONSILLECTOMY    . VAGINAL HYSTERECTOMY      OB History    No data available       Home Medications    Prior to Admission medications   Medication Sig Start Date End Date Taking? Authorizing Provider  Sulfamethoxazole-Trimethoprim (SEPTRA PO) Take by mouth.   Yes Historical Provider, MD  gabapentin (NEURONTIN) 300 MG capsule Take 300 mg by mouth 3 (three) times daily.    Historical Provider, MD  glucose blood test strip  12/07/14   Historical Provider, MD  HUMALOG 100 UNIT/ML injection  11/22/14   Historical Provider, MD  Insulin Human (INSULIN PUMP) SOLN Inject into the skin continuous. humalog    Historical Provider, MD  levothyroxine (SYNTHROID, LEVOTHROID) 100 MCG tablet Take 100 mcg by mouth daily.    Historical Provider, MD  metoCLOPramide (REGLAN) 10 MG tablet Take 1 tablet every morning and 1 tablet at bedtime 03/12/16   Jerene Bears, MD  promethazine (PHENERGAN) 25 MG tablet Take 1 tablet (25 mg total) by mouth every 6 (six) hours as needed for nausea or vomiting. 03/12/16   Jerene Bears,  MD    Family History Family History  Problem Relation Age of Onset  . Diabetes Mother   . Heart attack Mother   . Ovarian cancer Paternal Grandmother     great grandmother    Social History Social History  Substance Use Topics  . Smoking status: Current Every Day Smoker    Packs/day: 2.00    Types: Cigarettes  . Smokeless tobacco: Never Used  . Alcohol use No     Allergies   Lipitor [atorvastatin] and Codeine   Review of Systems Review of Systems  Constitutional: Positive for fever.    Gastrointestinal: Positive for abdominal pain, constipation, nausea and vomiting.  Genitourinary: Negative for dysuria, vaginal bleeding and vaginal discharge.       +malodorous vaginal discharge  Musculoskeletal: Positive for back pain (right lower).  All other systems reviewed and are negative.    Physical Exam Updated Vital Signs BP 122/69 (BP Location: Right Arm)   Pulse 74   Temp 98.6 F (37 C) (Oral)   Resp 18   Ht 5\' 1"  (1.549 m)   Wt 115 lb (52.2 kg)   SpO2 100%   BMI 21.73 kg/m   Physical Exam  Constitutional: She is oriented to person, place, and time. She appears well-developed and well-nourished. No distress.  HENT:  Head: Normocephalic and atraumatic.  Eyes: EOM are normal.  Neck: Neck supple.  Cardiovascular: Normal rate, regular rhythm and normal heart sounds.  Exam reveals no gallop and no friction rub.   No murmur heard. Pulmonary/Chest: Effort normal and breath sounds normal. No respiratory distress. She has no wheezes. She has no rales.  Abdominal: Soft. She exhibits no distension. There is tenderness in the right lower quadrant. There is no rebound.  Musculoskeletal: Normal range of motion.  Neurological: She is alert and oriented to person, place, and time.  Skin: Skin is warm and dry.  Psychiatric: She has a normal mood and affect. Her behavior is normal.  Nursing note and vitals reviewed.    ED Treatments / Results  DIAGNOSTIC STUDIES: Oxygen Saturation is 100% on RA, nl by my interpretation.    COORDINATION OF CARE: 8:02 PM Discussed treatment plan with pt at bedside which includes labs, UA, US transvaginal, abdomen acute with chest xray, US pelvis xray, and pt agreed to plan.   Labs (all labs ordered are listed, but only abnormal results are displayed) Labs Reviewed  URINALYSIS, ROUTINE W REFLEX MICROSCOPIC - Abnormal; Notable for the following:       Result Value   Glucose, UA 250 (*)    All other components within normal limits  CBC  WITH DIFFERENTIAL/PLATELET - Abnormal; Notable for the following:    WBC 11.4 (*)    All other components within normal limits  COMPREHENSIVE METABOLIC PANEL - Abnormal; Notable for the following:    Glucose, Bld 176 (*)    All other components within normal limits  CBG MONITORING, ED - Abnormal; Notable for the following:    Glucose-Capillary 300 (*)    All other components within normal limits  LIPASE, BLOOD    Radiology US Transvaginal Non-ob  Result Date: 10/06/2016 CLINICAL DATA:  Right lower quadrant pain for 2 weeks. Previous removal of uterus and left ovary. EXAM: TRANSABDOMINAL AND TRANSVAGINAL ULTRASOUND OF PELVIS TECHNIQUE: Both transabdominal and transvaginal ultrasound examinations of the pelvis were performed. Transabdominal technique was performed for global imaging of the pelvis including uterus, ovaries, adnexal regions, and pelvic cul-de-sac. It was necessary to proceed with endovaginal  exam following the transabdominal exam to visualize the right ovary. COMPARISON:  None FINDINGS: Uterus Surgically absent Endometrium Surgically absent Right ovary Unable to visualize the right ovary due to a large amount of bowel gas. No adnexal masses are identified. Left ovary Left ovary is surgically absent.  No adnexal masses identified. Other findings Small amount of free fluid is demonstrated around the right adnexa. IMPRESSION: Surgical absence of the uterus and left ovary. Right ovary is not visualized due to bowel gas. Small amount of free fluid. Electronically Signed   By: Lucienne Capers M.D.   On: 10/06/2016 21:40   US Pelvis Complete  Result Date: 10/06/2016 CLINICAL DATA:  Right lower quadrant pain for 2 weeks. Previous removal of uterus and left ovary. EXAM: TRANSABDOMINAL AND TRANSVAGINAL ULTRASOUND OF PELVIS TECHNIQUE: Both transabdominal and transvaginal ultrasound examinations of the pelvis were performed. Transabdominal technique was performed for global imaging of the pelvis  including uterus, ovaries, adnexal regions, and pelvic cul-de-sac. It was necessary to proceed with endovaginal exam following the transabdominal exam to visualize the right ovary. COMPARISON:  None FINDINGS: Uterus Surgically absent Endometrium Surgically absent Right ovary Unable to visualize the right ovary due to a large amount of bowel gas. No adnexal masses are identified. Left ovary Left ovary is surgically absent.  No adnexal masses identified. Other findings Small amount of free fluid is demonstrated around the right adnexa. IMPRESSION: Surgical absence of the uterus and left ovary. Right ovary is not visualized due to bowel gas. Small amount of free fluid. Electronically Signed   By: Lucienne Capers M.D.   On: 10/06/2016 21:40   Dg Abdomen Acute W/chest  Result Date: 10/06/2016 CLINICAL DATA:  Nausea and vomiting. Constipation and fever. Abdominal pain. EXAM: DG ABDOMEN ACUTE W/ 1V CHEST COMPARISON:  CT abdomen pelvis 07/03/2016 FINDINGS: There is no evidence of dilated bowel loops or free intraperitoneal air. No radiopaque calculi or other significant radiographic abnormality is seen. Heart size and mediastinal contours are within normal limits. Both lungs are mildly hyperexpanded but clear. Moderate stool throughout the colon. IMPRESSION: Moderate stool throughout the colon without evidence of obstruction. No acute cardiopulmonary disease. Electronically Signed   By: Ulyses Jarred M.D.   On: 10/06/2016 20:56    Procedures Procedures (including critical care time)  Medications Ordered in ED Medications - No data to display   Initial Impression / Assessment and Plan / ED Course  I have reviewed the triage vital signs and the nursing notes.  Pertinent labs & imaging results that were available during my care of the patient were reviewed by me and considered in my medical decision making (see chart for details).    Patient is nontoxic, nonseptic appearing, in no apparent distress.   Patient's pain and other symptoms adequately managed in emergency department.  Fluid bolus given.  Labs, imaging and vitals reviewed.  Patient does not meet the SIRS or Sepsis criteria.  On repeat exam patient does not have a surgical abdomin and there are no peritoneal signs.  No indication of appendicitis, bowel obstruction, bowel perforation, cholecystitis, diverticulitis, PID.  Patient discharged home with symptomatic treatment and given strict instructions for follow-up with their primary care physician.  I have also discussed reasons to return immediately to the ER.  Patient expresses understanding and agrees with plan.     Final Clinical Impressions(s) / ED Diagnoses   Final diagnoses:  Pelvic pain  Pelvic pain in female    New Prescriptions Discharge Medication List as of 10/06/2016  10:06 PM    START taking these medications   Details  HYDROcodone-acetaminophen (NORCO) 5-325 MG tablet Take 1 tablet by mouth every 6 (six) hours as needed for severe pain., Starting Tue 10/06/2016, Print    ondansetron (ZOFRAN ODT) 4 MG disintegrating tablet 4mg  ODT q4 hours prn nausea/vomit, Print       I personally performed the services described in this documentation, which was scribed in my presence. The recorded information has been reviewed and is accurate.    Etta Quill, NP 10/07/16 [id number]    Drenda Freeze, MD 10/09/16 [phone]

## 2016-10-15 ENCOUNTER — Inpatient Hospital Stay (HOSPITAL_COMMUNITY)
Admission: AD | Admit: 2016-10-15 | Discharge: 2016-10-15 | Disposition: A | Discharge status: Home / Self Care | Payer: Medicare Other | Source: Ambulatory Visit | Attending: Obstetrics & Gynecology | Admitting: Obstetrics & Gynecology

## 2016-10-15 ENCOUNTER — Encounter (HOSPITAL_COMMUNITY): Payer: Self-pay

## 2016-10-15 ENCOUNTER — Inpatient Hospital Stay (HOSPITAL_COMMUNITY): Payer: Medicare Other

## 2016-10-15 DIAGNOSIS — E1143 Type 2 diabetes mellitus with diabetic autonomic (poly)neuropathy: Secondary | ICD-10-CM | POA: Insufficient documentation

## 2016-10-15 DIAGNOSIS — R102 Pelvic and perineal pain: Secondary | ICD-10-CM | POA: Diagnosis not present

## 2016-10-15 DIAGNOSIS — K3184 Gastroparesis: Secondary | ICD-10-CM | POA: Insufficient documentation

## 2016-10-15 DIAGNOSIS — R1031 Right lower quadrant pain: Secondary | ICD-10-CM | POA: Diagnosis not present

## 2016-10-15 DIAGNOSIS — I10 Essential (primary) hypertension: Secondary | ICD-10-CM | POA: Insufficient documentation

## 2016-10-15 DIAGNOSIS — F17210 Nicotine dependence, cigarettes, uncomplicated: Secondary | ICD-10-CM | POA: Diagnosis not present

## 2016-10-15 DIAGNOSIS — Z3202 Encounter for pregnancy test, result negative: Secondary | ICD-10-CM | POA: Insufficient documentation

## 2016-10-15 LAB — URINALYSIS, ROUTINE W REFLEX MICROSCOPIC
Bilirubin Urine: NEGATIVE
Glucose, UA: NEGATIVE mg/dL
Hgb urine dipstick: NEGATIVE
Ketones, ur: 5 mg/dL — AB
Nitrite: NEGATIVE
PROTEIN: NEGATIVE mg/dL
SPECIFIC GRAVITY, URINE: 1.018 (ref 1.005–1.030)
pH: 6 (ref 5.0–8.0)

## 2016-10-15 LAB — WET PREP, GENITAL
SPERM: NONE SEEN
Trich, Wet Prep: NONE SEEN
Yeast Wet Prep HPF POC: NONE SEEN

## 2016-10-15 LAB — COMPREHENSIVE METABOLIC PANEL
ALBUMIN: 4.4 g/dL (ref 3.5–5.0)
ALK PHOS: 94 U/L (ref 38–126)
ALT: 19 U/L (ref 14–54)
AST: 20 U/L (ref 15–41)
Anion gap: 11 (ref 5–15)
BUN: 11 mg/dL (ref 6–20)
CO2: 24 mmol/L (ref 22–32)
Calcium: 10 mg/dL (ref 8.9–10.3)
Chloride: 102 mmol/L (ref 101–111)
Creatinine, Ser: 0.84 mg/dL (ref 0.44–1.00)
GFR calc Af Amer: >60 mL/min (ref >60)
GFR calc non Af Amer: >60 mL/min (ref >60)
GLUCOSE: 97 mg/dL (ref 65–99)
POTASSIUM: 3.7 mmol/L (ref 3.5–5.1)
SODIUM: 137 mmol/L (ref 135–145)
TOTAL PROTEIN: 7.9 g/dL (ref 6.5–8.1)
Total Bilirubin: 0.6 mg/dL (ref 0.3–1.2)

## 2016-10-15 LAB — CBC
HEMATOCRIT: 41 % (ref 36.0–46.0)
HEMOGLOBIN: 14.4 g/dL (ref 12.0–15.0)
MCH: 29 pg (ref 26.0–34.0)
MCHC: 35.1 g/dL (ref 30.0–36.0)
MCV: 82.7 fL (ref 78.0–100.0)
Platelets: 428 10*3/uL — ABNORMAL HIGH (ref 150–400)
RBC: 4.96 MIL/uL (ref 3.87–5.11)
RDW: 13.8 % (ref 11.5–15.5)
WBC: 9.4 10*3/uL (ref 4.0–10.5)

## 2016-10-15 LAB — LIPASE, BLOOD: Lipase: 21 U/L (ref 11–51)

## 2016-10-15 LAB — POCT PREGNANCY, URINE: PREG TEST UR: NEGATIVE

## 2016-10-15 MED ORDER — IOPAMIDOL (ISOVUE-300) INJECTION 61%
100.0000 mL | Freq: Once | INTRAVENOUS | Status: AC | PRN
  Administered 2016-10-15: 100 mL via INTRAVENOUS

## 2016-10-15 MED ORDER — IOPAMIDOL (ISOVUE-300) INJECTION 61%
30.0000 mL | INTRAVENOUS | Status: AC
  Administered 2016-10-15 (×2): 30 mL via ORAL

## 2016-10-15 MED ORDER — HYDROMORPHONE HCL 2 MG/ML IJ SOLN
2.0000 mg | Freq: Once | INTRAMUSCULAR | Status: AC
  Administered 2016-10-15: 2 mg via INTRAMUSCULAR
  Filled 2016-10-15: qty 1

## 2016-10-15 NOTE — MAU Provider Note (Signed)
Chief Complaint: Pelvic Pain   First Provider Initiated Contact with Patient 10/15/16 1809      SUBJECTIVE HPI: Karen Hayden is a 41 y.o. G2P0 with hx total hysterectomy and left oophorectomy only 10+ years ago who presents to maternity admissions reporting severe RLQ abdominal pain and intermittent chest pain. She reports the chest pain occurs only when she is upset or stressed and started about 4 weeks ago. She has a strong family hx of heart disease/heart attacks so is worried about this.  The pain is substernal tightness pain that radiates to her left shoulder.  Her abdominal pain started 3 weeks ago but was mild and intermittent and is now constant and worsening rapidly.  It is associated with n/v, and dizziness. She has tried heat which helps her pain for a short time.  She is unsure if she has her appendix.  She denies hx of STDs, has new partner x 7 months.  She was seen on 10/06/16 at [facility] and her right ovary was not visualized on [place].  Her symptoms have continued to worsen so she came in for evaluation today.  She reports her symptoms are similar to pregnancy and wants to know if this could be possible.  She denies vaginal bleeding, vaginal itching/burning, urinary symptoms, h/a, dizziness, n/v, or fever/chills.     HPI  Past Medical History:  Diagnosis Date  . Bipolar 1 disorder ([place])   . Chronic kidney infection   . 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 DM (diabetes mellitus) ([place])   . Gastroparesis    Due to diabetes   . Hyperlipidemia   . Hypertension   . Seizure disorder ([place])   . Thyroid disease   . Umbilical hernia    Past Surgical History:  Procedure Laterality Date  . TONSILLECTOMY    . VAGINAL HYSTERECTOMY     Social History   Social History  . Marital status: Married    Spouse name: N/A  . Number of children: 2  . Years of education: N/A   Occupational History  . homemaker    Social History Main Topics  . Smoking status: Current Every Day Smoker     Packs/day: 2.00    Types: Cigarettes  . Smokeless tobacco: Never Used  . Alcohol use No  . Drug use: Yes    Types: Marijuana  . Sexual activity: Yes    Birth control/ protection: Surgical   Other Topics Concern  . Not on file   Social History Narrative  . No narrative on file   No current facility-administered medications on file prior to encounter.    Current Outpatient Prescriptions on File Prior to Encounter  Medication Sig Dispense Refill  . gabapentin (NEURONTIN) 300 MG capsule Take 300 mg by mouth 3 (three) times daily.    Marland Kitchen HYDROcodone-acetaminophen (NORCO) 5-325 MG tablet Take 1 tablet by mouth every 6 (six) hours as needed for severe pain. 6 tablet 0  . Insulin Human (INSULIN PUMP) SOLN Inject into the skin continuous. humalog    . levothyroxine (SYNTHROID, LEVOTHROID) 100 MCG tablet Take 100 mcg by mouth daily.    . metoCLOPramide (REGLAN) 10 MG tablet Take 1 tablet every morning and 1 tablet at bedtime 60 tablet 10  . ondansetron (ZOFRAN ODT) 4 MG disintegrating tablet 4mg  ODT q4 hours prn nausea/vomit (Patient taking differently: Take 4 mg by mouth every 4 (four) hours as needed for nausea. ) 4 tablet 0  . promethazine (PHENERGAN) 25 MG tablet Take 1  tablet (25 mg total) by mouth every 6 (six) hours as needed for nausea or vomiting. 60 tablet 10   Allergies  Allergen Reactions  . Lipitor [Atorvastatin]     Attacks joints, inflamation  . Codeine Rash    ROS:  Review of Systems  Constitutional: Negative for chills, fatigue and fever.  HENT: Negative for sinus pressure.   Eyes: Negative for photophobia.  Respiratory: Positive for chest tightness. Negative for shortness of breath.   Cardiovascular: Negative for chest pain.  Gastrointestinal: Positive for nausea and vomiting. Negative for abdominal pain.  Genitourinary: Positive for pelvic pain. Negative for difficulty urinating, dysuria, flank pain, frequency, vaginal bleeding, vaginal discharge and vaginal pain.   Musculoskeletal: Negative for neck pain.  Neurological: Positive for dizziness. Negative for weakness and headaches.  Psychiatric/Behavioral: Negative.      I have reviewed patient's Past Medical Hx, Surgical Hx, Family Hx, Social Hx, medications and allergies.   Physical Exam   Patient Vitals for the past 24 hrs:  BP Temp Temp src Pulse Resp Height Weight  10/15/16 1738 136/96 97.7 F (36.5 C) Oral 86 18 5\' 1"  (1.549 m) 119 lb 1.9 oz (54 kg)   Constitutional: Well-developed, well-nourished female in significant distress.  Cardiovascular: normal rate Respiratory: normal effort GI: Abd soft,  Fullness in RLQ/right pelvis with significant tenderness to palpation, negative rebound tenderness Pos BS x 4 MS: Extremities nontender, no edema, normal ROM Neurologic: Alert and oriented x 4.  GU: Neg CVAT.  PELVIC EXAM: vaginal cuff pink, well approximated, no edema or errythema, scant white creamy discharge, vaginal walls and external genitalia normal Bimanual exam: Deferred r/t pt significant pain.  Abdominal palpation reveals fullness in RLQ/right pelvis with significant tenderness to palpation, negative rebound tenderness   LAB RESULTS Results for orders placed or performed during the hospital encounter of 10/15/16 (from the past 24 hour(s))  Urinalysis, Routine w reflex microscopic     Status: Abnormal   Collection Time: 10/15/16  5:40 PM  Result Value Ref Range   Color, Urine AMBER (A) YELLOW   APPearance CLOUDY (A) CLEAR   Specific Gravity, Urine 1.018 1.005 - 1.030   pH 6.0 5.0 - 8.0   Glucose, UA NEGATIVE NEGATIVE mg/dL   Hgb urine dipstick NEGATIVE NEGATIVE   Bilirubin Urine NEGATIVE NEGATIVE   Ketones, ur 5 (A) NEGATIVE mg/dL   Protein, ur NEGATIVE NEGATIVE mg/dL   Nitrite NEGATIVE NEGATIVE   Leukocytes, UA LARGE (A) NEGATIVE   RBC / HPF 0-5 0 - 5 RBC/hpf   WBC, UA TOO NUMEROUS TO COUNT 0 - 5 WBC/hpf   Bacteria, UA RARE (A) NONE SEEN   Squamous Epithelial / LPF TOO  NUMEROUS TO COUNT (A) NONE SEEN   Mucous PRESENT   Pregnancy, urine POC     Status: None   Collection Time: 10/15/16  6:16 PM  Result Value Ref Range   Preg Test, Ur NEGATIVE NEGATIVE  Wet prep, genital     Status: Abnormal   Collection Time: 10/15/16  6:50 PM  Result Value Ref Range   Yeast Wet Prep HPF POC NONE SEEN NONE SEEN   Trich, Wet Prep NONE SEEN NONE SEEN   Clue Cells Wet Prep HPF POC PRESENT (A) NONE SEEN   WBC, Wet Prep HPF POC FEW (A) NONE SEEN   Sperm NONE SEEN   CBC     Status: Abnormal   Collection Time: 10/15/16  6:53 PM  Result Value Ref Range   WBC 9.4 4.0 -  10.5 K/uL   RBC 4.96 3.87 - 5.11 MIL/uL   Hemoglobin 14.4 12.0 - 15.0 g/dL   HCT 41.0 36.0 - 46.0 %   MCV 82.7 78.0 - 100.0 fL   MCH 29.0 26.0 - 34.0 pg   MCHC 35.1 30.0 - 36.0 g/dL   RDW 13.8 11.5 - 15.5 %   Platelets 428 (H) 150 - 400 K/uL  Comprehensive metabolic panel     Status: None   Collection Time: 10/15/16  6:53 PM  Result Value Ref Range   Sodium 137 135 - 145 mmol/L   Potassium 3.7 3.5 - 5.1 mmol/L   Chloride 102 101 - 111 mmol/L   CO2 24 22 - 32 mmol/L   Glucose, Bld 97 65 - 99 mg/dL   BUN 11 6 - 20 mg/dL   Creatinine, Ser 0.84 0.44 - 1.00 mg/dL   Calcium 10.0 8.9 - 10.3 mg/dL   Total Protein 7.9 6.5 - 8.1 g/dL   Albumin 4.4 3.5 - 5.0 g/dL   AST 20 15 - 41 U/L   ALT 19 14 - 54 U/L   Alkaline Phosphatase 94 38 - 126 U/L   Total Bilirubin 0.6 0.3 - 1.2 mg/dL   GFR calc non Af Amer >60 >60 mL/min   GFR calc Af Amer >60 >60 mL/min   Anion gap 11 5 - 15  Lipase, blood     Status: None   Collection Time: 10/15/16  6:53 PM  Result Value Ref Range   Lipase 21 11 - 51 U/L       IMAGING US Transvaginal Non-ob  Result Date: 10/15/2016 CLINICAL DATA:  Right-sided pelvic pain for 3 weeks. EXAM: ULTRASOUND PELVIS TRANSVAGINAL TECHNIQUE: Transvaginal ultrasound examination of the pelvis was performed including evaluation of the uterus, ovaries, adnexal regions, and pelvic cul-de-sac.  COMPARISON:  Ultrasound of October 06, 2016. FINDINGS: Status post hysterectomy and left oophorectomy. Right ovary Measurements: 2.0 x 1.4 x 1.2 cm. Normal appearance/no adnexal mass. Other findings:  No abnormal free fluid. IMPRESSION: Status post hysterectomy and left oophorectomy. No other abnormality seen in the pelvis. Electronically Signed   By: Marijo Conception, M.D.   On: 10/15/2016 20:04   US Transvaginal Non-ob  Result Date: 10/06/2016 CLINICAL DATA:  Right lower quadrant pain for 2 weeks. Previous removal of uterus and left ovary. EXAM: TRANSABDOMINAL AND TRANSVAGINAL ULTRASOUND OF PELVIS TECHNIQUE: Both transabdominal and transvaginal ultrasound examinations of the pelvis were performed. Transabdominal technique was performed for global imaging of the pelvis including uterus, ovaries, adnexal regions, and pelvic cul-de-sac. It was necessary to proceed with endovaginal exam following the transabdominal exam to visualize the right ovary. COMPARISON:  None FINDINGS: Uterus Surgically absent Endometrium Surgically absent Right ovary Unable to visualize the right ovary due to a large amount of bowel gas. No adnexal masses are identified. Left ovary Left ovary is surgically absent.  No adnexal masses identified. Other findings Small amount of free fluid is demonstrated around the right adnexa. IMPRESSION: Surgical absence of the uterus and left ovary. Right ovary is not visualized due to bowel gas. Small amount of free fluid. Electronically Signed   By: Lucienne Capers M.D.   On: 10/06/2016 21:40   US Pelvis Complete  Result Date: 10/06/2016 CLINICAL DATA:  Right lower quadrant pain for 2 weeks. Previous removal of uterus and left ovary. EXAM: TRANSABDOMINAL AND TRANSVAGINAL ULTRASOUND OF PELVIS TECHNIQUE: Both transabdominal and transvaginal ultrasound examinations of the pelvis were performed. Transabdominal technique was performed for global imaging  of the pelvis including uterus, ovaries, adnexal  regions, and pelvic cul-de-sac. It was necessary to proceed with endovaginal exam following the transabdominal exam to visualize the right ovary. COMPARISON:  None FINDINGS: Uterus Surgically absent Endometrium Surgically absent Right ovary Unable to visualize the right ovary due to a large amount of bowel gas. No adnexal masses are identified. Left ovary Left ovary is surgically absent.  No adnexal masses identified. Other findings Small amount of free fluid is demonstrated around the right adnexa. IMPRESSION: Surgical absence of the uterus and left ovary. Right ovary is not visualized due to bowel gas. Small amount of free fluid. Electronically Signed   By: Lucienne Capers M.D.   On: 10/06/2016 21:40   Dg Abdomen Acute W/chest  Result Date: 10/06/2016 CLINICAL DATA:  Nausea and vomiting. Constipation and fever. Abdominal pain. EXAM: DG ABDOMEN ACUTE W/ 1V CHEST COMPARISON:  CT abdomen pelvis 07/03/2016 FINDINGS: There is no evidence of dilated bowel loops or free intraperitoneal air. No radiopaque calculi or other significant radiographic abnormality is seen. Heart size and mediastinal contours are within normal limits. Both lungs are mildly hyperexpanded but clear. Moderate stool throughout the colon. IMPRESSION: Moderate stool throughout the colon without evidence of obstruction. No acute cardiopulmonary disease. Electronically Signed   By: Ulyses Jarred M.D.   On: 10/06/2016 20:56   US Transvaginal Non-ob  Result Date: 10/15/2016 CLINICAL DATA:  Right-sided pelvic pain for 3 weeks. EXAM: ULTRASOUND PELVIS TRANSVAGINAL TECHNIQUE: Transvaginal ultrasound examination of the pelvis was performed including evaluation of the uterus, ovaries, adnexal regions, and pelvic cul-de-sac. COMPARISON:  Ultrasound of October 06, 2016. FINDINGS: Status post hysterectomy and left oophorectomy. Right ovary Measurements: 2.0 x 1.4 x 1.2 cm. Normal appearance/no adnexal mass. Other findings:  No abnormal free fluid.  IMPRESSION: Status post hysterectomy and left oophorectomy. No other abnormality seen in the pelvis. Electronically Signed   By: Marijo Conception, M.D.   On: 10/15/2016 20:04   Ct Abdomen Pelvis W Contrast  Result Date: 10/15/2016 CLINICAL DATA:  Right pelvic pain off and on for several weeks. Pain radiates down the leg and back. EXAM: CT ABDOMEN AND PELVIS WITH CONTRAST TECHNIQUE: Multidetector CT imaging of the abdomen and pelvis was performed using the standard protocol following bolus administration of intravenous contrast. CONTRAST:  197mL ISOVUE-300 IOPAMIDOL (ISOVUE-300) INJECTION 61% COMPARISON:  Ultrasound pelvis 10/15/2016. CT abdomen and pelvis 07/03/2016 FINDINGS: Lower chest: Mild dependent changes in the lung bases. Hepatobiliary: No focal liver abnormality is seen. No gallstones, gallbladder wall thickening, or biliary dilatation. Pancreas: Unremarkable. No pancreatic ductal dilatation or surrounding inflammatory changes. Spleen: Normal in size without focal abnormality. Adrenals/Urinary Tract: Adrenal glands are unremarkable. Kidneys are normal, without renal calculi, focal lesion, or hydronephrosis. Bladder is unremarkable. Stomach/Bowel: Stomach is unremarkable. Short-segment focal intussusception of the proximal jejunum in the left upper quadrant most likely to represent a benign transient intussusception. No evidence of bowel obstruction or wall thickening. Colon is stool filled without abnormal distention or wall thickening. Appendix is normal. Vascular/Lymphatic: Normal caliber abdominal aorta and IVC. Scattered retroperitoneal lymph nodes and celiac axis lymph nodes are moderately prominent but without pathologic enlargement. Appearance is similar to prior study and likely represent reactive change. Reproductive: Status post hysterectomy. No adnexal masses. Other: No abdominal wall hernia or abnormality. No abdominopelvic ascites. Musculoskeletal: No acute or significant osseous findings.  IMPRESSION: Short-segment intussusception in the left upper quadrant jejunum, likely representing benign transient intussusception. No evidence of bowel obstruction or wall thickening. Otherwise, no acute  process demonstrated in the abdomen or pelvis. Unchanged appearance of moderately prominent but not pathologically enlarged retroperitoneal lymph nodes, likely to be reactive. Electronically Signed   By: Lucienne Capers M.D.   On: 10/15/2016 22:55    MAU Management/MDM: Ordered labs and [place] and reviewed results. No Gyn causes of pt pain.  Dilaudid 2 mg IM given.  Pt to CT scan.  Consult Dr Harolyn Rutherford, reviewed presentation, [place], labs.  CT results pending.  Report to Marcille Buffy, CNM.  2306: Discussed CT findings with Dr. Harolyn Rutherford. She will consult with general surgery.  2320: Dr. Harolyn Rutherford spoke with Dr. Marlou Starks, ok for DC home, and call the surgical clinic for follow up.   Assessment/Plan 1. Female pelvic pain   2. Abdominal pain, RLQ    DC home Comfort measures reviewed  RX: none Return to MAU as needed   Follow-up Information    TOTH III,PAUL S, MD Follow up.   Specialty:  General Surgery Why:  Call for a FU appointment  Contact information: [address] [place] [address] [phone]            Fatima Blank Certified Nurse-Midwife 10/15/2016  9:12 PM

## 2016-10-15 NOTE — MAU Provider Note (Signed)
Karen Hayden is a 41 year old [id number] female presenting to the MAU for severe right sided pelvic pain.  Her pain started three weeks ago which started as a dull pain and has progressed to a sharp pain. It radiates to her back and right thigh.  She rates her pain a 10/10.  She also complains of nausea, vomiting, chest pain, and lightheadedness. She denies vaginal discharge and urinary symptoms. Applying heat to the area makes her symptoms better and she notices that it is worse at night.  She is unsure if she has her appendix. She had a hysterectomy in her late twenties due to endometriosis. She denies any history of STI's. She got a new partner 7 months ago and has not been tested for STIs recently.

## 2016-10-15 NOTE — Discharge Instructions (Signed)

## 2016-10-15 NOTE — MAU Note (Signed)
Pt reports she had R pelvic pain on and off for several weeks Pain radiates down her leg and to her back. [facility] and where unable to visualize ovary. Pt had hysterectomy  13 years ago but left right ovary. Pt also c/o nausea . Pt not taking anything for pain was given Rx for hydo codone but only took 2 was afraid of constipation.

## 2016-10-16 LAB — GC/CHLAMYDIA PROBE AMP (~~LOC~~) NOT AT ARMC
Chlamydia: NEGATIVE
Neisseria Gonorrhea: NEGATIVE

## 2016-10-16 LAB — HIV ANTIBODY (ROUTINE TESTING W REFLEX): HIV SCREEN 4TH GENERATION: NONREACTIVE

## 2016-10-17 LAB — URINE CULTURE

## 2016-10-20 DIAGNOSIS — E039 Hypothyroidism, unspecified: Secondary | ICD-10-CM | POA: Diagnosis not present

## 2016-10-20 DIAGNOSIS — G609 Hereditary and idiopathic neuropathy, unspecified: Secondary | ICD-10-CM | POA: Diagnosis not present

## 2016-10-20 DIAGNOSIS — E7800 Pure hypercholesterolemia, unspecified: Secondary | ICD-10-CM | POA: Diagnosis not present

## 2016-10-20 DIAGNOSIS — L709 Acne, unspecified: Secondary | ICD-10-CM | POA: Diagnosis not present

## 2016-10-20 DIAGNOSIS — E109 Type 1 diabetes mellitus without complications: Secondary | ICD-10-CM | POA: Diagnosis not present

## 2016-10-23 DIAGNOSIS — K5909 Other constipation: Secondary | ICD-10-CM | POA: Diagnosis not present

## 2016-12-31 DIAGNOSIS — N390 Urinary tract infection, site not specified: Secondary | ICD-10-CM | POA: Diagnosis not present

## 2017-01-18 DIAGNOSIS — E109 Type 1 diabetes mellitus without complications: Secondary | ICD-10-CM | POA: Diagnosis not present

## 2017-01-18 DIAGNOSIS — E7800 Pure hypercholesterolemia, unspecified: Secondary | ICD-10-CM | POA: Diagnosis not present

## 2017-01-18 DIAGNOSIS — G609 Hereditary and idiopathic neuropathy, unspecified: Secondary | ICD-10-CM | POA: Diagnosis not present

## 2017-01-18 DIAGNOSIS — E039 Hypothyroidism, unspecified: Secondary | ICD-10-CM | POA: Diagnosis not present

## 2017-03-04 ENCOUNTER — Telehealth: Payer: Self-pay | Admitting: Internal Medicine

## 2017-03-04 MED ORDER — PROMETHAZINE HCL 25 MG PO TABS: 25.0000 mg | ORAL_TABLET | Freq: Four times a day (QID) | ORAL | 1 refills | Status: DC | PRN

## 2017-03-04 NOTE — Telephone Encounter (Signed)
Rx sent 

## 2017-04-08 ENCOUNTER — Other Ambulatory Visit: Payer: Self-pay | Admitting: Internal Medicine

## 2017-04-15 DIAGNOSIS — E109 Type 1 diabetes mellitus without complications: Secondary | ICD-10-CM | POA: Diagnosis not present

## 2017-04-15 DIAGNOSIS — B9689 Other specified bacterial agents as the cause of diseases classified elsewhere: Secondary | ICD-10-CM | POA: Diagnosis not present

## 2017-04-15 DIAGNOSIS — Z79899 Other long term (current) drug therapy: Secondary | ICD-10-CM | POA: Diagnosis not present

## 2017-04-15 DIAGNOSIS — F17210 Nicotine dependence, cigarettes, uncomplicated: Secondary | ICD-10-CM | POA: Diagnosis not present

## 2017-04-15 DIAGNOSIS — Z720 Tobacco use: Secondary | ICD-10-CM | POA: Diagnosis not present

## 2017-04-15 DIAGNOSIS — R51 Headache: Secondary | ICD-10-CM | POA: Diagnosis not present

## 2017-04-15 DIAGNOSIS — N390 Urinary tract infection, site not specified: Secondary | ICD-10-CM | POA: Diagnosis not present

## 2017-04-15 DIAGNOSIS — G4453 Primary thunderclap headache: Secondary | ICD-10-CM | POA: Diagnosis not present

## 2017-04-15 DIAGNOSIS — E079 Disorder of thyroid, unspecified: Secondary | ICD-10-CM | POA: Diagnosis not present

## 2017-04-15 DIAGNOSIS — R42 Dizziness and giddiness: Secondary | ICD-10-CM | POA: Diagnosis not present

## 2017-04-15 DIAGNOSIS — R112 Nausea with vomiting, unspecified: Secondary | ICD-10-CM | POA: Diagnosis not present

## 2017-04-18 ENCOUNTER — Emergency Department (HOSPITAL_BASED_OUTPATIENT_CLINIC_OR_DEPARTMENT_OTHER)
Admission: EM | Admit: 2017-04-18 | Discharge: 2017-04-18 | Disposition: A | Discharge status: Home / Self Care | Payer: Medicare Other | Attending: Emergency Medicine | Admitting: Emergency Medicine

## 2017-04-18 ENCOUNTER — Emergency Department (HOSPITAL_BASED_OUTPATIENT_CLINIC_OR_DEPARTMENT_OTHER): Payer: Medicare Other

## 2017-04-18 ENCOUNTER — Encounter (HOSPITAL_BASED_OUTPATIENT_CLINIC_OR_DEPARTMENT_OTHER): Payer: Self-pay | Admitting: Emergency Medicine

## 2017-04-18 DIAGNOSIS — F17210 Nicotine dependence, cigarettes, uncomplicated: Secondary | ICD-10-CM | POA: Insufficient documentation

## 2017-04-18 DIAGNOSIS — R002 Palpitations: Secondary | ICD-10-CM | POA: Insufficient documentation

## 2017-04-18 DIAGNOSIS — Z794 Long term (current) use of insulin: Secondary | ICD-10-CM | POA: Insufficient documentation

## 2017-04-18 DIAGNOSIS — R079 Chest pain, unspecified: Secondary | ICD-10-CM | POA: Insufficient documentation

## 2017-04-18 DIAGNOSIS — Z79899 Other long term (current) drug therapy: Secondary | ICD-10-CM | POA: Diagnosis not present

## 2017-04-18 DIAGNOSIS — I10 Essential (primary) hypertension: Secondary | ICD-10-CM | POA: Diagnosis not present

## 2017-04-18 DIAGNOSIS — E119 Type 2 diabetes mellitus without complications: Secondary | ICD-10-CM | POA: Insufficient documentation

## 2017-04-18 DIAGNOSIS — R109 Unspecified abdominal pain: Secondary | ICD-10-CM | POA: Diagnosis not present

## 2017-04-18 LAB — CBC WITH DIFFERENTIAL/PLATELET
BASOS ABS: 0 10*3/uL (ref 0.0–0.1)
BASOS PCT: 0 %
EOS ABS: 0.1 10*3/uL (ref 0.0–0.7)
Eosinophils Relative: 1 %
HEMATOCRIT: 38.8 % (ref 36.0–46.0)
HEMOGLOBIN: 13.4 g/dL (ref 12.0–15.0)
Lymphocytes Relative: 18 %
Lymphs Abs: 1.9 10*3/uL (ref 0.7–4.0)
MCH: 27.5 pg (ref 26.0–34.0)
MCHC: 34.5 g/dL (ref 30.0–36.0)
MCV: 79.7 fL (ref 78.0–100.0)
MONO ABS: 0.7 10*3/uL (ref 0.1–1.0)
Monocytes Relative: 7 %
NEUTROS ABS: 7.7 10*3/uL (ref 1.7–7.7)
NEUTROS PCT: 74 %
Platelets: 377 10*3/uL (ref 150–400)
RBC: 4.87 MIL/uL (ref 3.87–5.11)
RDW: 13.5 % (ref 11.5–15.5)
WBC: 10.4 10*3/uL (ref 4.0–10.5)

## 2017-04-18 LAB — BASIC METABOLIC PANEL
ANION GAP: 10 (ref 5–15)
BUN: 10 mg/dL (ref 6–20)
CO2: 22 mmol/L (ref 22–32)
Calcium: 9.7 mg/dL (ref 8.9–10.3)
Chloride: 103 mmol/L (ref 101–111)
Creatinine, Ser: 0.84 mg/dL (ref 0.44–1.00)
GFR calc Af Amer: >60 mL/min (ref >60)
Glucose, Bld: 240 mg/dL — ABNORMAL HIGH (ref 65–99)
POTASSIUM: 3.5 mmol/L (ref 3.5–5.1)
SODIUM: 135 mmol/L (ref 135–145)

## 2017-04-18 LAB — TROPONIN I: Troponin I: <0.03 ng/mL (ref <0.03)

## 2017-04-18 LAB — D-DIMER, QUANTITATIVE (NOT AT ARMC)

## 2017-04-18 NOTE — ED Triage Notes (Signed)
Patient states that she was recently treated for a HA  - is a type one diabetic and has "passed out" several times since the HA treatment of low Blood sugar. The patient also reports that she has had HTN over the last 2 -3 days. The patient comes to triage, hyperventilating, anxious with complaints of "i dont know if I want ot call it chest pain or tightness" - palpitations and SOB while at work earlier today. Numbness to her face and hands.

## 2017-04-18 NOTE — ED Provider Notes (Signed)
[place] DEPT [facility] Provider Note   CSN: [id number] Arrival date & time: 04/18/17  1226     History   Chief Complaint Chief Complaint  Patient presents with  . Palpitations    HPI Karen Hayden is a 41 y.o. female.  HPI Patient presents with chest pain. Tightness in her chest. Has had palpitations shortness of breath. Reportedly looked pale earlier while at work. Has a new stressful job. States she feels as if her mouth and hands tighten up and tingle. She does smoke. States her mother had her first heart attack at 68 years old. No cough. Recently had a visit to outside hospital due to hypoglycemia headache and syncope. States does have some tightness behind her neck which is increased since her most recent job. No dysuria but recurrently being treated for urinary tract infection. States that her sugars down from 60 up to 500 at times. Past Medical History:  Diagnosis Date  . Bipolar 1 disorder ([place])   . Chronic kidney infection   . Diabetic gastroparesis ([place])   . DM (diabetes mellitus) ([place])   . Gastroparesis    Due to diabetes   . Hyperlipidemia   . Hypertension   . Seizure disorder ([place])   . Thyroid disease   . Umbilical hernia     Patient Active Problem List   Diagnosis Date Noted  . Protein-calorie malnutrition, severe ([place]) 01/29/2014  . Nausea & vomiting 01/26/2014  . Unspecified constipation 01/26/2014  . Cough 01/26/2014  . Tobacco use disorder 01/26/2014  . Left sided abdominal pain 06/21/2013  . Gastroparesis 06/21/2013  . Nausea with vomiting 06/21/2013  . Other general symptoms(780.99)  06/21/2013  . Change in bowel habits 06/21/2013  . Diabetic gastroparesis ([place]) 06/04/2011  . DIABETES MELLITUS, TYPE I 01/04/2009  . Acute bronchitis 01/04/2009  . SEIZURE DISORDER 01/04/2009    Past Surgical History:  Procedure Laterality Date  . TONSILLECTOMY    . VAGINAL HYSTERECTOMY      OB History    Gravida Para Term Preterm AB Living   2         2     SAB TAB Ectopic Multiple Live Births   0 0             Home Medications    Prior to Admission medications   Medication Sig Start Date End Date Taking? Authorizing Provider  gabapentin (NEURONTIN) 300 MG capsule Take 300 mg by mouth 3 (three) times daily.    [provider]  HYDROcodone-acetaminophen (NORCO) 5-325 MG tablet Take 1 tablet by mouth every 6 (six) hours as needed for severe pain. 10/06/16   Etta Quill, NP  Insulin Human (INSULIN PUMP) SOLN Inject into the skin continuous. humalog    [provider]  levothyroxine (SYNTHROID, LEVOTHROID) 100 MCG tablet Take 100 mcg by mouth daily.    [provider]  metoCLOPramide (REGLAN) 10 MG tablet Take 1 tablet every morning and 1 tablet at bedtime 04/08/17   Pyrtle, Lajuan Lines, MD  Multiple Vitamin (MULTIVITAMIN WITH MINERALS) TABS tablet Take 1 tablet by mouth daily.    [provider]  ondansetron (ZOFRAN ODT) 4 MG disintegrating tablet 4mg  ODT q4 hours prn nausea/vomit Patient taking differently: Take 4 mg by mouth every 4 (four) hours as needed for nausea.  10/06/16   Etta Quill, NP  polyethylene glycol [facility] / Floria Raveling) packet Take 17 g by mouth daily.    [provider]  promethazine (PHENERGAN) 25 MG tablet Take 1  tablet (25 mg total) by mouth every 6 (six) hours as needed for nausea or vomiting. 03/04/17   Pyrtle, Lajuan Lines, MD  trimethoprim (TRIMPEX) 100 MG tablet Take 100 mg by mouth daily. Long term med    [provider]    Family History Family History  Problem Relation Age of Onset  . Diabetes Mother   . Heart attack Mother   . Ovarian cancer Paternal Grandmother        great grandmother    Social History Social History  Substance Use Topics  . Smoking status: Current Every Day Smoker    Packs/day: 2.00    Types: Cigarettes  . Smokeless tobacco: Never Used  . Alcohol use No     Allergies   Lipitor [atorvastatin] and Codeine   Review of Systems Review of  Systems  Constitutional: Positive for fatigue. Negative for appetite change.  HENT: Negative for congestion.   Respiratory: Positive for shortness of breath.   Cardiovascular: Positive for chest pain, palpitations and leg swelling.  Gastrointestinal: Negative for abdominal pain.  Genitourinary: Negative for flank pain.  Musculoskeletal: Negative for back pain.  Skin: Negative for rash.  Neurological: Positive for headaches.  Hematological: Negative for adenopathy.  Psychiatric/Behavioral: The patient is nervous/anxious.      Physical Exam Updated Vital Signs BP 112/64 (BP Location: Left Arm)   Pulse 64   Temp 98.5 F (36.9 C) (Oral)   Resp (!) 22   Ht 5\' 1"  (1.549 m)   Wt 53.5 kg (118 lb)   SpO2 100%   BMI 22.30 kg/m   Physical Exam  Constitutional: She appears well-developed.  HENT:  Head: Normocephalic.  Eyes: Pupils are equal, round, and reactive to light.  Neck: Neck supple.  Cardiovascular: Normal rate.   Pulmonary/Chest:  Somewhat dyspneic but also anxious  Abdominal: Soft. There is no tenderness.  Musculoskeletal: Normal range of motion. She exhibits no edema.  Neurological: She is alert.  Skin: Skin is warm. Capillary refill takes less than 2 seconds.  Psychiatric:  Patient is somewhat anxious.     ED Treatments / Results  Labs (all labs ordered are listed, but only abnormal results are displayed) Labs Reviewed  BASIC METABOLIC PANEL - Abnormal; Notable for the following:       Result Value   Glucose, Bld 240 (*)    All other components within normal limits  TROPONIN I  D-DIMER, QUANTITATIVE (NOT AT [facility])  CBC WITH DIFFERENTIAL/PLATELET  BASIC METABOLIC PANEL  D-DIMER, QUANTITATIVE (NOT AT [facility])    EKG  EKG Interpretation  Date/Time:  Sunday April 18 2017 12:32:43 EDT Ventricular Rate:  84 PR Interval:  172 QRS Duration: 88 QT Interval:  400 QTC Calculation: 472 R Axis:   80 Text Interpretation:  Normal sinus rhythm Normal ECG Confirmed  by Davonna Belling [phone]) on 04/18/2017 12:37:08 PM       Radiology Dg Chest 2 View  Result Date: 04/18/2017 CLINICAL DATA:  Patient states that she was recently treated for a HA - is a type one diabetic and has "passed out" several times since the HA treatment of low Blood sugar. The patient also reports that she has had HTN over the last 2 -3 days. The patient comes to triage, hyperventilating, anxious with complaints of "i dont know if I want ot call it chest pain or tightness" - palpitations and SOB while at work earlier today. Numbness to her face and hands. EXAM: CHEST  2 VIEW COMPARISON:  10/06/2016 FINDINGS: Cardiac  silhouette is normal size. No mediastinal or hilar masses. No evidence of adenopathy. Clear lungs.  No pleural effusion or pneumothorax. Skeletal structures are unremarkable. IMPRESSION: No active cardiopulmonary disease. Electronically Signed   By: Lajean Manes M.D.   On: 04/18/2017 13:22    Procedures Procedures (including critical care time)  Medications Ordered in ED Medications - No data to display   Initial Impression / Assessment and Plan / ED Course  I have reviewed the triage vital signs and the nursing notes.  Pertinent labs & imaging results that were available during my care of the patient were reviewed by me and considered in my medical decision making (see chart for details).     Patient with chest pain and palpitations. Maybe anxiety component. EKG and lab work reassuring. Mild hyperglycemia. Negative d-dimer. Doubt cardiac cause. Will discharge home to follow with PCP as needed.  Final Clinical Impressions(s) / ED Diagnoses   Final diagnoses:  Palpitations  Chest pain, unspecified type    New Prescriptions Discharge Medication List as of 04/18/2017  2:46 PM       Davonna Belling, MD 04/18/17 1536

## 2017-04-18 NOTE — ED Notes (Signed)
Report taken   Visitor at bedside  Pt denies pain

## 2017-04-18 NOTE — ED Notes (Signed)
Report given to Sue RN

## 2017-04-20 DIAGNOSIS — E039 Hypothyroidism, unspecified: Secondary | ICD-10-CM | POA: Diagnosis not present

## 2017-04-20 DIAGNOSIS — G609 Hereditary and idiopathic neuropathy, unspecified: Secondary | ICD-10-CM | POA: Diagnosis not present

## 2017-04-20 DIAGNOSIS — E109 Type 1 diabetes mellitus without complications: Secondary | ICD-10-CM | POA: Diagnosis not present

## 2017-04-20 DIAGNOSIS — E7800 Pure hypercholesterolemia, unspecified: Secondary | ICD-10-CM | POA: Diagnosis not present

## 2017-04-20 DIAGNOSIS — R634 Abnormal weight loss: Secondary | ICD-10-CM | POA: Diagnosis not present

## 2017-04-22 ENCOUNTER — Other Ambulatory Visit: Payer: Self-pay | Admitting: Internal Medicine

## 2017-05-05 ENCOUNTER — Inpatient Hospital Stay (HOSPITAL_COMMUNITY)
Admission: EM | Admit: 2017-05-05 | Discharge: 2017-05-10 | DRG: 637 | Disposition: A | Discharge status: Home / Self Care | Payer: Medicare Other | Attending: Internal Medicine | Admitting: Internal Medicine

## 2017-05-05 ENCOUNTER — Inpatient Hospital Stay (HOSPITAL_COMMUNITY): Payer: Medicare Other

## 2017-05-05 ENCOUNTER — Ambulatory Visit: Payer: Medicare Other | Admitting: Internal Medicine

## 2017-05-05 ENCOUNTER — Encounter (HOSPITAL_COMMUNITY): Payer: Self-pay | Admitting: Emergency Medicine

## 2017-05-05 DIAGNOSIS — D649 Anemia, unspecified: Secondary | ICD-10-CM | POA: Diagnosis present

## 2017-05-05 DIAGNOSIS — K2970 Gastritis, unspecified, without bleeding: Secondary | ICD-10-CM | POA: Diagnosis present

## 2017-05-05 DIAGNOSIS — Z888 Allergy status to other drugs, medicaments and biological substances status: Secondary | ICD-10-CM | POA: Diagnosis not present

## 2017-05-05 DIAGNOSIS — E039 Hypothyroidism, unspecified: Secondary | ICD-10-CM | POA: Diagnosis present

## 2017-05-05 DIAGNOSIS — Z599 Problem related to housing and economic circumstances, unspecified: Secondary | ICD-10-CM

## 2017-05-05 DIAGNOSIS — R0600 Dyspnea, unspecified: Secondary | ICD-10-CM

## 2017-05-05 DIAGNOSIS — E1010 Type 1 diabetes mellitus with ketoacidosis without coma: Secondary | ICD-10-CM | POA: Diagnosis not present

## 2017-05-05 DIAGNOSIS — E108 Type 1 diabetes mellitus with unspecified complications: Secondary | ICD-10-CM | POA: Diagnosis not present

## 2017-05-05 DIAGNOSIS — E109 Type 1 diabetes mellitus without complications: Secondary | ICD-10-CM | POA: Diagnosis present

## 2017-05-05 DIAGNOSIS — R1310 Dysphagia, unspecified: Secondary | ICD-10-CM | POA: Diagnosis not present

## 2017-05-05 DIAGNOSIS — E875 Hyperkalemia: Secondary | ICD-10-CM | POA: Diagnosis present

## 2017-05-05 DIAGNOSIS — K3184 Gastroparesis: Secondary | ICD-10-CM | POA: Diagnosis present

## 2017-05-05 DIAGNOSIS — Z9641 Presence of insulin pump (external) (internal): Secondary | ICD-10-CM | POA: Diagnosis present

## 2017-05-05 DIAGNOSIS — I10 Essential (primary) hypertension: Secondary | ICD-10-CM | POA: Diagnosis present

## 2017-05-05 DIAGNOSIS — B349 Viral infection, unspecified: Secondary | ICD-10-CM | POA: Diagnosis present

## 2017-05-05 DIAGNOSIS — E876 Hypokalemia: Secondary | ICD-10-CM | POA: Diagnosis present

## 2017-05-05 DIAGNOSIS — E871 Hypo-osmolality and hyponatremia: Secondary | ICD-10-CM | POA: Diagnosis present

## 2017-05-05 DIAGNOSIS — Z885 Allergy status to narcotic agent status: Secondary | ICD-10-CM | POA: Diagnosis not present

## 2017-05-05 DIAGNOSIS — E1143 Type 2 diabetes mellitus with diabetic autonomic (poly)neuropathy: Secondary | ICD-10-CM | POA: Diagnosis not present

## 2017-05-05 DIAGNOSIS — E860 Dehydration: Secondary | ICD-10-CM | POA: Diagnosis present

## 2017-05-05 DIAGNOSIS — Z79899 Other long term (current) drug therapy: Secondary | ICD-10-CM | POA: Diagnosis not present

## 2017-05-05 DIAGNOSIS — D72829 Elevated white blood cell count, unspecified: Secondary | ICD-10-CM | POA: Diagnosis present

## 2017-05-05 DIAGNOSIS — J209 Acute bronchitis, unspecified: Secondary | ICD-10-CM | POA: Diagnosis present

## 2017-05-05 DIAGNOSIS — R410 Disorientation, unspecified: Secondary | ICD-10-CM | POA: Diagnosis present

## 2017-05-05 DIAGNOSIS — E1043 Type 1 diabetes mellitus with diabetic autonomic (poly)neuropathy: Secondary | ICD-10-CM | POA: Diagnosis present

## 2017-05-05 DIAGNOSIS — G9340 Encephalopathy, unspecified: Secondary | ICD-10-CM | POA: Diagnosis present

## 2017-05-05 DIAGNOSIS — R112 Nausea with vomiting, unspecified: Secondary | ICD-10-CM | POA: Diagnosis not present

## 2017-05-05 DIAGNOSIS — R079 Chest pain, unspecified: Secondary | ICD-10-CM | POA: Diagnosis not present

## 2017-05-05 DIAGNOSIS — E1310 Other specified diabetes mellitus with ketoacidosis without coma: Secondary | ICD-10-CM

## 2017-05-05 DIAGNOSIS — Z8669 Personal history of other diseases of the nervous system and sense organs: Secondary | ICD-10-CM

## 2017-05-05 DIAGNOSIS — F17210 Nicotine dependence, cigarettes, uncomplicated: Secondary | ICD-10-CM | POA: Diagnosis present

## 2017-05-05 LAB — CBG MONITORING, ED
GLUCOSE-CAPILLARY: 577 mg/dL — AB (ref 65–99)
GLUCOSE-CAPILLARY: 289 mg/dL — AB (ref 65–99)
Glucose-Capillary: 534 mg/dL (ref 65–99)
Glucose-Capillary: 227 mg/dL — ABNORMAL HIGH (ref 65–99)

## 2017-05-05 LAB — BASIC METABOLIC PANEL
Anion gap: 11 (ref 5–15)
BUN: 25 mg/dL — ABNORMAL HIGH (ref 6–20)
BUN: 16 mg/dL (ref 6–20)
CHLORIDE: 114 mmol/L — AB (ref 101–111)
CO2: <7 mmol/L — ABNORMAL LOW (ref 22–32)
CO2: 10 mmol/L — ABNORMAL LOW (ref 22–32)
CREATININE: 0.84 mg/dL (ref 0.44–1.00)
Calcium: 9.3 mg/dL (ref 8.9–10.3)
Calcium: 7.4 mg/dL — ABNORMAL LOW (ref 8.9–10.3)
Chloride: 93 mmol/L — ABNORMAL LOW (ref 101–111)
Creatinine, Ser: 1.38 mg/dL — ABNORMAL HIGH (ref 0.44–1.00)
GFR calc Af Amer: 54 mL/min — ABNORMAL LOW (ref >60)
GFR calc non Af Amer: 46 mL/min — ABNORMAL LOW (ref >60)
GFR calc non Af Amer: >60 mL/min (ref >60)
Glucose, Bld: 587 mg/dL (ref 65–99)
Glucose, Bld: 164 mg/dL — ABNORMAL HIGH (ref 65–99)
Potassium: 5.7 mmol/L — ABNORMAL HIGH (ref 3.5–5.1)
Potassium: 4.4 mmol/L (ref 3.5–5.1)
Sodium: 124 mmol/L — ABNORMAL LOW (ref 135–145)
Sodium: 135 mmol/L (ref 135–145)

## 2017-05-05 LAB — CBC WITH DIFFERENTIAL/PLATELET
Basophils Absolute: 0 10*3/uL (ref 0.0–0.1)
Basophils Relative: 0 %
Eosinophils Absolute: 0 10*3/uL (ref 0.0–0.7)
Eosinophils Relative: 0 %
HCT: 38.4 % (ref 36.0–46.0)
Hemoglobin: 13 g/dL (ref 12.0–15.0)
Lymphocytes Relative: 9 %
Lymphs Abs: 3.4 10*3/uL (ref 0.7–4.0)
MCH: 27.7 pg (ref 26.0–34.0)
MCHC: 33.9 g/dL (ref 30.0–36.0)
MCV: 81.9 fL (ref 78.0–100.0)
Monocytes Absolute: 0.7 10*3/uL (ref 0.1–1.0)
Monocytes Relative: 2 %
Neutro Abs: 33.2 10*3/uL — ABNORMAL HIGH (ref 1.7–7.7)
Neutrophils Relative %: 89 %
Platelets: 472 10*3/uL — ABNORMAL HIGH (ref 150–400)
RBC: 4.69 MIL/uL (ref 3.87–5.11)
RDW: 13.7 % (ref 11.5–15.5)
WBC: 37.3 10*3/uL — ABNORMAL HIGH (ref 4.0–10.5)

## 2017-05-05 LAB — MAGNESIUM: Magnesium: 1.8 mg/dL (ref 1.7–2.4)

## 2017-05-05 LAB — BLOOD GAS, VENOUS
Acid-base deficit: 22.5 mmol/L — ABNORMAL HIGH (ref 0.0–2.0)
Bicarbonate: 6.7 mmol/L — ABNORMAL LOW (ref 20.0–28.0)
O2 Saturation: 72.9 %
Patient temperature: 98.6
pCO2, Ven: 23.5 mmHg — ABNORMAL LOW (ref 44.0–60.0)
pH, Ven: 7.085 — CL (ref 7.250–7.430)
pO2, Ven: 50.3 mmHg — ABNORMAL HIGH (ref 32.0–45.0)

## 2017-05-05 LAB — URINALYSIS, ROUTINE W REFLEX MICROSCOPIC
Bacteria, UA: NONE SEEN
Bilirubin Urine: NEGATIVE
Glucose, UA: >=500 mg/dL — AB
Ketones, ur: 80 mg/dL — AB
Leukocytes, UA: NEGATIVE
Nitrite: NEGATIVE
Protein, ur: NEGATIVE mg/dL
Specific Gravity, Urine: 1.018 (ref 1.005–1.030)
pH: 5 (ref 5.0–8.0)

## 2017-05-05 LAB — GLUCOSE, CAPILLARY
GLUCOSE-CAPILLARY: 144 mg/dL — AB (ref 65–99)
Glucose-Capillary: 178 mg/dL — ABNORMAL HIGH (ref 65–99)
Glucose-Capillary: 184 mg/dL — ABNORMAL HIGH (ref 65–99)
Glucose-Capillary: 186 mg/dL — ABNORMAL HIGH (ref 65–99)

## 2017-05-05 LAB — LACTIC ACID, PLASMA: Lactic Acid, Venous: 1.7 mmol/L (ref 0.5–1.9)

## 2017-05-05 LAB — TROPONIN I

## 2017-05-05 LAB — PROCALCITONIN: Procalcitonin: 34.55 ng/mL

## 2017-05-05 LAB — TSH: TSH: 0.011 u[IU]/mL — AB (ref 0.350–4.500)

## 2017-05-05 MED ORDER — DEXTROSE 5 % IV SOLN
500.0000 mg | INTRAVENOUS | Status: DC
  Administered 2017-05-05: 500 mg via INTRAVENOUS
  Filled 2017-05-05: qty 500

## 2017-05-05 MED ORDER — PROMETHAZINE HCL 25 MG/ML IJ SOLN
6.2500 mg | Freq: Four times a day (QID) | INTRAMUSCULAR | Status: DC | PRN
  Administered 2017-05-05 – 2017-05-07 (×5): 6.25 mg via INTRAVENOUS
  Filled 2017-05-05 (×6): qty 1

## 2017-05-05 MED ORDER — ONDANSETRON HCL 4 MG/2ML IJ SOLN
4.0000 mg | Freq: Once | INTRAMUSCULAR | Status: AC
  Administered 2017-05-05: 4 mg via INTRAVENOUS
  Filled 2017-05-05: qty 2

## 2017-05-05 MED ORDER — PROMETHAZINE HCL 25 MG/ML IJ SOLN
12.5000 mg | Freq: Once | INTRAMUSCULAR | Status: AC
  Administered 2017-05-05: 12.5 mg via INTRAVENOUS
  Filled 2017-05-05: qty 1

## 2017-05-05 MED ORDER — DEXTROSE-NACL 5-0.45 % IV SOLN
INTRAVENOUS | Status: DC
  Administered 2017-05-05 – 2017-05-06 (×2): via INTRAVENOUS

## 2017-05-05 MED ORDER — SODIUM CHLORIDE 0.9 % IV SOLN
INTRAVENOUS | Status: AC
  Administered 2017-05-05: 21:00:00 via INTRAVENOUS

## 2017-05-05 MED ORDER — SODIUM CHLORIDE 0.9 % IV SOLN
INTRAVENOUS | Status: DC
  Administered 2017-05-05: 17:00:00 via INTRAVENOUS

## 2017-05-05 MED ORDER — GABAPENTIN 300 MG PO CAPS
300.0000 mg | ORAL_CAPSULE | Freq: Three times a day (TID) | ORAL | Status: DC
  Administered 2017-05-05 – 2017-05-10 (×14): 300 mg via ORAL
  Filled 2017-05-05 (×14): qty 1

## 2017-05-05 MED ORDER — LEVOTHYROXINE SODIUM 100 MCG PO TABS
100.0000 ug | ORAL_TABLET | Freq: Every day | ORAL | Status: DC
  Administered 2017-05-06 – 2017-05-10 (×5): 100 ug via ORAL
  Filled 2017-05-05 (×5): qty 1

## 2017-05-05 MED ORDER — HEPARIN SODIUM (PORCINE) 5000 UNIT/ML IJ SOLN
5000.0000 [IU] | Freq: Three times a day (TID) | INTRAMUSCULAR | Status: DC
  Administered 2017-05-05 – 2017-05-09 (×13): 5000 [IU] via SUBCUTANEOUS
  Filled 2017-05-05 (×13): qty 1

## 2017-05-05 MED ORDER — DEXTROSE 5 % IV SOLN
1.0000 g | INTRAVENOUS | Status: DC
  Administered 2017-05-05: 1 g via INTRAVENOUS
  Filled 2017-05-05: qty 10

## 2017-05-05 MED ORDER — SODIUM CHLORIDE 0.9 % IV BOLUS (SEPSIS)
2000.0000 mL | Freq: Once | INTRAVENOUS | Status: AC
  Administered 2017-05-05: 2000 mL via INTRAVENOUS

## 2017-05-05 MED ORDER — MORPHINE SULFATE (PF) 4 MG/ML IV SOLN: 4.0000 mg | Freq: Once | INTRAVENOUS | Status: DC

## 2017-05-05 MED ORDER — METOCLOPRAMIDE HCL 5 MG/ML IJ SOLN
5.0000 mg | Freq: Three times a day (TID) | INTRAMUSCULAR | Status: DC
  Administered 2017-05-05 (×2): 5 mg via INTRAVENOUS
  Filled 2017-05-05 (×3): qty 2

## 2017-05-05 MED ORDER — SODIUM CHLORIDE 0.9 % IV BOLUS (SEPSIS)
1000.0000 mL | Freq: Once | INTRAVENOUS | Status: AC
  Administered 2017-05-05: 1000 mL via INTRAVENOUS

## 2017-05-05 MED ORDER — SODIUM CHLORIDE 0.9 % IV SOLN
INTRAVENOUS | Status: DC
  Administered 2017-05-05: 4.7 [IU]/h via INTRAVENOUS
  Administered 2017-05-06: 1 [IU]/h via INTRAVENOUS
  Administered 2017-05-06: 3.8 [IU]/h via INTRAVENOUS
  Filled 2017-05-05 (×2): qty 1

## 2017-05-05 NOTE — Care Management Note (Signed)
Case Management Note  Patient Details  Name: Karen Hayden MRN: [id number] Date of Birth: August 26, 1976  Subjective/Objective:                  Hyperglycemia  Action/Plan: ED CM spoke with the patient at the bedside. She is unable to provide more information related to difficulty affording her medications. She states she is having a hard time talking (shivering while CM is present). Patient has insurance. Patient is being admitted. Care Management will continue to follow for discharge needs.   Expected Discharge Date:   (unknown)               Expected Discharge Plan:     In-House Referral:     Discharge planning Services  Medication Assistance, CM Consult  Post Acute Care Choice:    Choice offered to:     DME Arranged:    DME Agency:     HH Arranged:    HH Agency:     Status of Service:  In process, will continue to follow  If discussed at Long Length of Stay Meetings, dates discussed:    Additional Comments:  Apolonio Schneiders, RN 05/05/2017, 6:30 PM

## 2017-05-05 NOTE — ED Notes (Signed)
Bed: WA01 Expected date:  Expected time:  Means of arrival:  Comments: EMS diabetic/abd. pain

## 2017-05-05 NOTE — ED Triage Notes (Signed)
Per [facility] pt picked up from home due to CBG monitor reading HIGH since last night, uncontrollable N/V/D, blood in stools, and generalized abdominal pain. Pt given 450CC NS and 8mg  OTD Zofran en route with no relief.   BP 113/85 100% RA RR 24-28 CBG HIGH

## 2017-05-05 NOTE — H&P (Signed)
PULMONARY / CRITICAL CARE MEDICINE   Name: Karen Hayden MRN: [id number] DOB: 05-13-1976    ADMISSION DATE:  05/05/2017 CONSULTATION DATE:  05/05/17  REFERRING MD:  Karen Hayden  CHIEF COMPLAINT:  Hyperglycemia  HISTORY OF PRESENT ILLNESS:   Karen Hayden is a 41 y.o. female with PMH including but not limited to DM on an insulin pump.  She [facility] to [facility] ED 05/05/17 with weakness, confusion, hyperglycemia.  She states she had not been feeling herself for a couple of days and had experienced subjective fevers, chills, productive cough, nausea, diarrhea.  She has apparently been exposed to what sounds like a viral GI illness through her work (co-workers with vomiting and diarrhea for a few days).  Has used insulin pump as usual, no known malfunctions.  In ED, labs were consistent with DKA.  She was started on routine DKA protocol treatment; however, due to venous pH 7.08, PCCM asked to admit.  PAST MEDICAL HISTORY :  She  has a past medical history of Bipolar 1 disorder ([place]); Chronic kidney infection; Diabetic gastroparesis ([place]); DM (diabetes mellitus) ([place]); Gastroparesis; Hyperlipidemia; Hypertension; Seizure disorder ([place]); Thyroid disease; and Umbilical hernia.  PAST SURGICAL HISTORY: She  has a past surgical history that includes Vaginal hysterectomy and Tonsillectomy.  Allergies  Allergen Reactions  . Lipitor [Atorvastatin]     Attacks joints, inflamation  . Codeine Rash    No current facility-administered medications on file prior to encounter.    Current Outpatient Prescriptions on File Prior to Encounter  Medication Sig  . gabapentin (NEURONTIN) 300 MG capsule Take 300 mg by mouth 3 (three) times daily.  . Insulin Human (INSULIN PUMP) SOLN Inject into the skin continuous. humalog  . levothyroxine (SYNTHROID, LEVOTHROID) 100 MCG tablet Take 100 mcg by mouth daily.  . metoCLOPramide (REGLAN) 10 MG tablet Take 1 tablet every morning and 1 tablet at bedtime  . Multiple Vitamin  (MULTIVITAMIN WITH MINERALS) TABS tablet Take 1 tablet by mouth daily.  . ondansetron (ZOFRAN ODT) 4 MG disintegrating tablet 4mg  ODT q4 hours prn nausea/vomit (Patient taking differently: Take 4 mg by mouth every 4 (four) hours as needed for nausea. )  . promethazine (PHENERGAN) 25 MG tablet Take 1 tablet (25 mg total) by mouth every 6 (six) hours as needed for nausea or vomiting.  . trimethoprim (TRIMPEX) 100 MG tablet Take 100 mg by mouth daily. Long term med  . HYDROcodone-acetaminophen (NORCO) 5-325 MG tablet Take 1 tablet by mouth every 6 (six) hours as needed for severe pain. (Patient not taking: Reported on 05/05/2017)    FAMILY HISTORY:  Her indicated that the status of her mother is unknown. She indicated that the status of her paternal grandmother is unknown.    SOCIAL HISTORY: She  reports that she has been smoking Cigarettes.  She has been smoking about 2.00 packs per day. She has never used smokeless tobacco. She reports that she uses drugs, including Marijuana. She reports that she does not drink alcohol.  REVIEW OF SYSTEMS:   All negative; except for those that are bolded, which indicate positives.  Constitutional: weight loss, weight gain, night sweats, subjective fevers, chills, fatigue, weakness.  HEENT: headaches, sore throat, sneezing, nasal congestion, post nasal drip, difficulty swallowing, tooth/dental problems, visual complaints, visual changes, ear aches. Neuro: difficulty with speech, weakness, numbness, ataxia. CV:  chest pain, orthopnea, PND, swelling in lower extremities, dizziness, palpitations, syncope.  Resp: cough, hemoptysis, dyspnea, wheezing. GI: heartburn, indigestion, abdominal pain, nausea, vomiting, diarrhea, constipation, change in  bowel habits, loss of appetite, hematemesis, melena, hematochezia.  GU: dysuria, change in color of urine, urgency or frequency, flank pain, hematuria. MSK: joint pain or swelling, decreased range of motion. Psych: change in  mood or affect, depression, anxiety, suicidal ideations, homicidal ideations. Skin: rash, itching, bruising.   SUBJECTIVE:  "I'm so thirsty".  Denies SOB.  Has some nausea, no vomiting.  VITAL SIGNS: BP 133/82   Pulse (!) 129   Temp 98 F (36.7 C) (Oral)   Resp 18   Ht 5\' 1"  (1.549 m)   Wt 53.5 kg (118 lb)   SpO2 100%   BMI 22.30 kg/m   HEMODYNAMICS:    VENTILATOR SETTINGS:    INTAKE / OUTPUT: No intake/output data recorded.   PHYSICAL EXAMINATION: General: Adult female, shivering. Neuro: Awake, able to answer all questions appropriately. HEENT: [place]/AT. PERRL, sclerae anicteric. Cardiovascular: Tachy, regular, no M/R/G.  Lungs: [place] respirations. Coarse RUL, otherwise clear. Abdomen: BS x 4, soft, NT/ND.  Musculoskeletal: No gross deformities, no edema.  Skin: Intact, warm, no rashes.  LABS:  BMET  Recent Labs Lab 05/05/17 1345  NA 124*  K 5.7*  CL 93*  CO2 <7*  BUN 25*  CREATININE 1.38*  GLUCOSE 587*    Electrolytes  Recent Labs Lab 05/05/17 1345  CALCIUM 9.3  MG 1.8    CBC  Recent Labs Lab 05/05/17 1345  WBC 37.3*  HGB 13.0  HCT 38.4  PLT 472*    Coag's No results for input(s): APTT, INR in the last 168 hours.  Sepsis Markers  Recent Labs Lab 05/05/17 1345  LATICACIDVEN 1.7    ABG No results for input(s): PHART, PCO2ART, PO2ART in the last 168 hours.  Liver Enzymes No results for input(s): AST, ALT, ALKPHOS, BILITOT, ALBUMIN in the last 168 hours.  Cardiac Enzymes No results for input(s): TROPONINI, PROBNP in the last 168 hours.  Glucose  Recent Labs Lab 05/05/17 1251 05/05/17 1542  GLUCAP 577* 534*    Imaging No results found.   STUDIES:  CXR 9/5 >   CULTURES: Blood 9/5 >  Sputum 9/5 >   ANTIBIOTICS: Ceftriaxone 9/5 >  Azithromycin 9/5 >   SIGNIFICANT EVENTS: 9/5 > admit.  LINES/TUBES: None.  DISCUSSION: 41 y.o. female admitted 9/5 with DKA.  Admitted to ICU due to venous pH 7.08 and  confusion.  ASSESSMENT / PLAN:  PULMONARY A: Possible CAP - productive cough with coarse breath sounds RUL and leukocytosis.  No CXR yet. P:   Assess CXR now. See ID section. Bronchial hygiene.  CARDIOVASCULAR A:  Sinus tach - likely due to dehydration from DKA. Hx HTN, HLD. P:  Continue IVF's. Assess troponin.  RENAL A:   Hyponatremia (exacerbated by hyperglycemia) - corrects to ~131. Mild hyperkalemia - due to DKA. AKI - presumed pre-renal from hypovolemia. AGMA - due to DKA. P:   Additional 2L NS now. Fluids per DKA protocol. BMP in AM.  GASTROINTESTINAL A:   Nausea. Hx diabetic gastroparesis. P:   Continue phenergan. NPO until gap closed.  HEMATOLOGIC A:   VTE Prophylaxis. P:  SCD's / heparin. CBC in AM.  INFECTIOUS A:   Possible CAP - productive cough with coarse breath sounds RUL and leukocytosis.  No CXR yet. P:   Abx as above (ceftriaxone / azithromycin).  Follow cultures as above. PCT algorithm to limit abx exposure.  ENDOCRINE A:   DKA. Hx DM, hypothyroidism. P:   Insulin and fluids per DKA protocol. Continue preadmission synthroid.  NEUROLOGIC A:  Acute encephalopathy - due to DKA. Hx bipolar 1 disorder. P: Avoid sedating meds. Continue preadmission gabapentin. Hold preadmission norco.  Family updated: Family member updated at bedside.  Interdisciplinary Family Meeting v Palliative Care Meeting:  Due by: 05/11/17.  CC time: 30 min.   Montey Hora, [facility] Pager: [phone]  or [phone] 05/05/2017, 4:30 PM  Attending Note:  I have examined patient, reviewed labs, studies and notes. I have discussed the case with Karen Hayden, and I agree with the data and plans as amended above.   41 yo woman with DM on insulin pump. Presented with AMS, weakness, hyperglycemia. Lab eval consistent with DKA. She reports some subjective fever, nausea/diarrhea, exposure to sick contacts at  work. Her insulin pump has apparently been working correctly.    On eval she is slightly agitated, uncomfortable. She will answer questions, follow commands. She c/o thirst. OP dry. Lungs with some R sided insp crackles. Heart tachy and regular without M. Abdomen benign. No edema.   Admit to ICU with DKA, no clear precipitant although consider viral gastroenteritis or possibly CAP. CXR is pending. Will treat empirically until CXR can be reviewed. Aggressive IVF, insulin gtt, serial labs ordered. Probably to SDU status 9/6 if stabilizes.   Independent critical care time is 40 minutes.   Baltazar Apo, MD, PhD 05/05/2017, 4:53 PM [facility] [phone] or if no answer [phone]

## 2017-05-06 DIAGNOSIS — J209 Acute bronchitis, unspecified: Secondary | ICD-10-CM

## 2017-05-06 DIAGNOSIS — K3184 Gastroparesis: Secondary | ICD-10-CM

## 2017-05-06 LAB — GLUCOSE, CAPILLARY
GLUCOSE-CAPILLARY: 146 mg/dL — AB (ref 65–99)
GLUCOSE-CAPILLARY: 222 mg/dL — AB (ref 65–99)
GLUCOSE-CAPILLARY: 243 mg/dL — AB (ref 65–99)
GLUCOSE-CAPILLARY: 203 mg/dL — AB (ref 65–99)
GLUCOSE-CAPILLARY: 131 mg/dL — AB (ref 65–99)
GLUCOSE-CAPILLARY: 149 mg/dL — AB (ref 65–99)
GLUCOSE-CAPILLARY: 148 mg/dL — AB (ref 65–99)
GLUCOSE-CAPILLARY: 160 mg/dL — AB (ref 65–99)
GLUCOSE-CAPILLARY: 133 mg/dL — AB (ref 65–99)
Glucose-Capillary: 113 mg/dL — ABNORMAL HIGH (ref 65–99)
Glucose-Capillary: 129 mg/dL — ABNORMAL HIGH (ref 65–99)
Glucose-Capillary: 129 mg/dL — ABNORMAL HIGH (ref 65–99)
Glucose-Capillary: 135 mg/dL — ABNORMAL HIGH (ref 65–99)
Glucose-Capillary: 148 mg/dL — ABNORMAL HIGH (ref 65–99)
Glucose-Capillary: 151 mg/dL — ABNORMAL HIGH (ref 65–99)
Glucose-Capillary: 165 mg/dL — ABNORMAL HIGH (ref 65–99)
Glucose-Capillary: 166 mg/dL — ABNORMAL HIGH (ref 65–99)
Glucose-Capillary: 167 mg/dL — ABNORMAL HIGH (ref 65–99)
Glucose-Capillary: 319 mg/dL — ABNORMAL HIGH (ref 65–99)

## 2017-05-06 LAB — CBC
HEMATOCRIT: 31.1 % — AB (ref 36.0–46.0)
Hemoglobin: 10.6 g/dL — ABNORMAL LOW (ref 12.0–15.0)
MCH: 27 pg (ref 26.0–34.0)
MCHC: 34.1 g/dL (ref 30.0–36.0)
MCV: 79.3 fL (ref 78.0–100.0)
Platelets: 302 10*3/uL (ref 150–400)
RBC: 3.92 MIL/uL (ref 3.87–5.11)
RDW: 13.8 % (ref 11.5–15.5)
WBC: 20.3 10*3/uL — ABNORMAL HIGH (ref 4.0–10.5)

## 2017-05-06 LAB — BASIC METABOLIC PANEL
Anion gap: 8 (ref 5–15)
Anion gap: 9 (ref 5–15)
Anion gap: 9 (ref 5–15)
Anion gap: 7 (ref 5–15)
Anion gap: 11 (ref 5–15)
Anion gap: 5 (ref 5–15)
BUN: 7 mg/dL (ref 6–20)
BUN: 12 mg/dL (ref 6–20)
BUN: 9 mg/dL (ref 6–20)
BUN: 7 mg/dL (ref 6–20)
CALCIUM: 8.2 mg/dL — AB (ref 8.9–10.3)
CALCIUM: 8.3 mg/dL — AB (ref 8.9–10.3)
CHLORIDE: 107 mmol/L (ref 101–111)
CHLORIDE: 109 mmol/L (ref 101–111)
CHLORIDE: 113 mmol/L — AB (ref 101–111)
CHLORIDE: 114 mmol/L — AB (ref 101–111)
CO2: 12 mmol/L — ABNORMAL LOW (ref 22–32)
CO2: 16 mmol/L — ABNORMAL LOW (ref 22–32)
CO2: 14 mmol/L — ABNORMAL LOW (ref 22–32)
CO2: 15 mmol/L — AB (ref 22–32)
CO2: 19 mmol/L — ABNORMAL LOW (ref 22–32)
CO2: 12 mmol/L — AB (ref 22–32)
CREATININE: 0.62 mg/dL (ref 0.44–1.00)
CREATININE: 0.6 mg/dL (ref 0.44–1.00)
CREATININE: 0.63 mg/dL (ref 0.44–1.00)
Calcium: 7.6 mg/dL — ABNORMAL LOW (ref 8.9–10.3)
Calcium: 8.2 mg/dL — ABNORMAL LOW (ref 8.9–10.3)
Calcium: 8.2 mg/dL — ABNORMAL LOW (ref 8.9–10.3)
Calcium: 8 mg/dL — ABNORMAL LOW (ref 8.9–10.3)
Chloride: 110 mmol/L (ref 101–111)
Chloride: 112 mmol/L — ABNORMAL HIGH (ref 101–111)
Creatinine, Ser: 0.65 mg/dL (ref 0.44–1.00)
Creatinine, Ser: 0.61 mg/dL (ref 0.44–1.00)
Creatinine, Ser: 0.56 mg/dL (ref 0.44–1.00)
GFR calc Af Amer: >60 mL/min (ref >60)
GFR calc Af Amer: >60 mL/min (ref >60)
GFR calc Af Amer: >60 mL/min (ref >60)
GFR calc Af Amer: >60 mL/min (ref >60)
GFR calc Af Amer: >60 mL/min (ref >60)
GFR calc non Af Amer: >60 mL/min (ref >60)
GFR calc non Af Amer: >60 mL/min (ref >60)
GFR calc non Af Amer: >60 mL/min (ref >60)
GLUCOSE: 134 mg/dL — AB (ref 65–99)
GLUCOSE: 193 mg/dL — AB (ref 65–99)
GLUCOSE: 111 mg/dL — AB (ref 65–99)
GLUCOSE: 149 mg/dL — AB (ref 65–99)
GLUCOSE: 197 mg/dL — AB (ref 65–99)
Glucose, Bld: 176 mg/dL — ABNORMAL HIGH (ref 65–99)
POTASSIUM: 2.9 mmol/L — AB (ref 3.5–5.1)
POTASSIUM: 3.2 mmol/L — AB (ref 3.5–5.1)
POTASSIUM: 3.6 mmol/L (ref 3.5–5.1)
POTASSIUM: 3.7 mmol/L (ref 3.5–5.1)
Potassium: 3.1 mmol/L — ABNORMAL LOW (ref 3.5–5.1)
Potassium: 3.1 mmol/L — ABNORMAL LOW (ref 3.5–5.1)
SODIUM: 137 mmol/L (ref 135–145)
SODIUM: 133 mmol/L — AB (ref 135–145)
SODIUM: 132 mmol/L — AB (ref 135–145)
SODIUM: 136 mmol/L (ref 135–145)
Sodium: 134 mmol/L — ABNORMAL LOW (ref 135–145)
Sodium: 130 mmol/L — ABNORMAL LOW (ref 135–145)

## 2017-05-06 LAB — PHOSPHORUS: Phosphorus: 1.8 mg/dL — ABNORMAL LOW (ref 2.5–4.6)

## 2017-05-06 LAB — MAGNESIUM: MAGNESIUM: 1.3 mg/dL — AB (ref 1.7–2.4)

## 2017-05-06 LAB — MRSA PCR SCREENING: MRSA BY PCR: NEGATIVE

## 2017-05-06 MED ORDER — POTASSIUM CHLORIDE IN NACL 20-0.45 MEQ/L-% IV SOLN
INTRAVENOUS | Status: DC
  Administered 2017-05-06 – 2017-05-07 (×3): via INTRAVENOUS
  Filled 2017-05-06 (×3): qty 1000

## 2017-05-06 MED ORDER — ONDANSETRON HCL 4 MG PO TABS
4.0000 mg | ORAL_TABLET | Freq: Three times a day (TID) | ORAL | Status: DC | PRN
  Administered 2017-05-07 – 2017-05-08 (×2): 4 mg via ORAL
  Filled 2017-05-06 (×3): qty 1

## 2017-05-06 MED ORDER — POTASSIUM PHOSPHATES 15 MMOLE/5ML IV SOLN
30.0000 mmol | Freq: Once | INTRAVENOUS | Status: AC
  Administered 2017-05-06: 30 mmol via INTRAVENOUS
  Filled 2017-05-06: qty 10

## 2017-05-06 MED ORDER — PROMETHAZINE HCL 25 MG PO TABS
12.5000 mg | ORAL_TABLET | Freq: Four times a day (QID) | ORAL | Status: DC | PRN
  Administered 2017-05-06 – 2017-05-07 (×2): 12.5 mg via ORAL
  Filled 2017-05-06 (×2): qty 1

## 2017-05-06 MED ORDER — MAGNESIUM SULFATE 4 GM/100ML IV SOLN
4.0000 g | Freq: Once | INTRAVENOUS | Status: AC
  Administered 2017-05-06: 4 g via INTRAVENOUS
  Filled 2017-05-06: qty 100

## 2017-05-06 MED ORDER — METOCLOPRAMIDE HCL 10 MG PO TABS
10.0000 mg | ORAL_TABLET | Freq: Two times a day (BID) | ORAL | Status: DC
  Administered 2017-05-06 (×2): 10 mg via ORAL
  Filled 2017-05-06 (×2): qty 1

## 2017-05-06 MED ORDER — INSULIN PUMP
SUBCUTANEOUS | Status: DC
  Administered 2017-05-06: via SUBCUTANEOUS
  Administered 2017-05-07: 4.5 via SUBCUTANEOUS
  Administered 2017-05-07: 0.725 via SUBCUTANEOUS
  Administered 2017-05-07 (×2): via SUBCUTANEOUS
  Filled 2017-05-06: qty 1

## 2017-05-06 NOTE — Progress Notes (Signed)
Inpatient Diabetes Program Recommendations  AACE/ADA: New Consensus Statement on Inpatient Glycemic Control (2015)  Target Ranges:  Prepandial:   less than 140 mg/dL      Peak postprandial:   less than 180 mg/dL (1-2 hours)      Critically ill patients:  140 - 180 mg/dL   Lab Results  Component Value Date   GLUCAP 160 (H) 05/06/2017    Review of Glycemic Control  Diabetes history: DM1 Outpatient Diabetes medications: Insulin pump Current orders for Inpatient glycemic control: IV insulin per DKA order set  AG closed. CO2 -   Pt states she is unable to afford her insulin and pump supplies. Does not have strips for glucose meter, therefore unable to check blood sugars. Pt and family very stressed out with pt being unable to manage her diabetes. Dr. Bubba Camp is endo. Pt has not told MD that she could not afford her insulin pump supplies, strips for meter and insulin. Pt and family agree that she will need an affordable insulin and may need to use Novolin 70/30 or NPH and Regular in place of insulin pump. Dtr to call MD and get appt when d/ced from hospital.  To restart insulin pump. D/C insulin drip when pump has been in place x 1 hour. Contract signed and CHO flow sheet given.  Will f/u in am.   Thank you. Lorenda Peck, RD, LDN, CDE Inpatient Diabetes Coordinator [phone]

## 2017-05-06 NOTE — Progress Notes (Signed)
PULMONARY / CRITICAL CARE MEDICINE   Name: Karen Hayden MRN: [id number] DOB: 1975/09/21    ADMISSION DATE:  05/05/2017 CONSULTATION DATE:  05/05/2017  REFERRING MD:  Wilson Singer  CHIEF COMPLAINT:  hyperglycemia  BRIEF:   41 y/o female with DM1 on an insulin pump who developed fevers, chills, cough, diarrhea and eventually DKA.  Her insulin pump had been working well to her knowledge.    SUBJECTIVE:  Remains on insulin drip, gap closed  VITAL SIGNS: BP 132/75   Pulse 95   Temp (!) 97.5 F (36.4 C) (Axillary)   Resp (!) 25   Ht 5\' 5"  (1.651 m) Comment: guessed  Wt 117 lb 4.6 oz (53.2 kg)   SpO2 100%   BMI 19.52 kg/m   HEMODYNAMICS:    VENTILATOR SETTINGS:    INTAKE / OUTPUT: I/O last 3 completed shifts: In: 6764.3 [I.V.:3464.3; IV Piggyback:3300] Out: 675 [Urine:675]  PHYSICAL EXAMINATION:   General:  Resting comfortably in bed HENT: NCAT OP clear PULM: CTA B, normal effort CV: RRR, no mgr GI: BS+, soft, nontender MSK: normal bulk and tone Neuro: awake, alert, no distress, MAEW   LABS:  BMET  Recent Labs Lab 05/05/17 2009 05/06/17 0056 05/06/17 0405  NA 135 134* 137  K 4.4 3.7 3.6  CL 114* 114* 113*  CO2 10* 12* 15*  BUN 16 12 9   CREATININE 0.84 0.60 0.63  GLUCOSE 164* 149* 111*    Electrolytes  Recent Labs Lab 05/05/17 1345 05/05/17 2009 05/06/17 0056 05/06/17 0405  CALCIUM 9.3 7.4* 7.6* 8.2*  MG 1.8  --   --  1.3*  PHOS  --   --   --  1.8*    CBC  Recent Labs Lab 05/05/17 1345 05/06/17 0405  WBC 37.3* 20.3*  HGB 13.0 10.6*  HCT 38.4 31.1*  PLT 472* 302    Coag's No results for input(s): APTT, INR in the last 168 hours.  Sepsis Markers  Recent Labs Lab 05/05/17 1345  LATICACIDVEN 1.7  PROCALCITON 34.55    ABG No results for input(s): PHART, PCO2ART, PO2ART in the last 168 hours.  Liver Enzymes No results for input(s): AST, ALT, ALKPHOS, BILITOT, ALBUMIN in the last 168 hours.  Cardiac Enzymes  Recent Labs Lab  05/05/17 1345  TROPONINI <0.03    Glucose  Recent Labs Lab 05/06/17 0159 05/06/17 0305 05/06/17 0418 05/06/17 0529 05/06/17 0632 05/06/17 0744  GLUCAP 129* 113* 129* 148* 222* 243*    Imaging Portable Chest X-ray (1 View)  Result Date: 05/05/2017 CLINICAL DATA:  Dyspnea. EXAM: PORTABLE CHEST 1 VIEW COMPARISON:  Radiographs of April 18, 2017. FINDINGS: The heart size and mediastinal contours are within normal limits. Both lungs are clear. No pneumothorax or pleural effusion is noted. The visualized skeletal structures are unremarkable. IMPRESSION: No acute cardiopulmonary abnormality seen. Electronically Signed   By: Marijo Conception, M.D.   On: 05/05/2017 17:26     STUDIES:  CXR 9/5 >   CULTURES: Blood 9/5 >  Sputum 9/5 >   ANTIBIOTICS: Ceftriaxone 9/5 >  Azithromycin 9/5 >   SIGNIFICANT EVENTS: 9/5 > admit  LINES/TUBES: None  DISCUSSION: 41 y/o female admitted on 9/5 with DKA.  She was admitted to the ICU due to encephalopath and a low serum pH.  ASSESSMENT / PLAN:  PULMONARY A: Acute bronchitis No acute issues P:   Monitor O2 saturation  CARDIOVASCULAR A:  No acute issues P:  Monitor hemodynamics  RENAL A:   DKA> gap closed,  now with NAGMA Hypokalemia Hypophos Hypomag P:   Electrolytes replaced 1/2 NS with KCl today Stop D5 1/2NS Monitor BMET and UOP Replace electrolytes as needed See endocrine  GASTROINTESTINAL A:   Gastroparesis P:   Advance diet > clears Restart home oral reglan, phenergan prn, zofran prn  HEMATOLOGIC A:   Anemia with out bleeding P:  Monitor for bleeding  INFECTIOUS A:   Acute viral illness P:   Stop antibiotics  ENDOCRINE A:   DKA P:   Change insulin infusion to pump today after stopping D5 Carb modified diet  NEUROLOGIC A:   Mild encephalopathy> improved Bipolar disorder Seizure disorder P:   Hold sedating meds Restart gabapentin   FAMILY  - Updates: none bedside, daughter to  bring in insulin pump supplies   To floor, TRH  Roselie Awkward, MD [place] PCCM Pager: [phone] Cell: [phone] After 3pm or if no response, call [phone]   05/06/2017, 9:08 AM

## 2017-05-06 NOTE — Progress Notes (Signed)
Initial Nutrition Assessment  DOCUMENTATION CODES:   Not applicable  INTERVENTION:  - Continue CLD and encouragement with intakes. - Further diet advancement as medically feasible. - Will monitor for needs at time of follow-up.   NUTRITION DIAGNOSIS:   Inadequate oral intake related to acute illness, nausea, vomiting as evidenced by per patient/family report.  GOAL:   Patient will meet greater than or equal to 90% of their needs  MONITOR:   PO intake, Diet advancement, Weight trends, Labs  REASON FOR ASSESSMENT:   Malnutrition Screening Tool  ASSESSMENT:   41 y.o. female with PMH including but not limited to DM on an insulin pump.  She presented to [facility] ED 05/05/17 with weakness, confusion, hyperglycemia.  She states she had not been feeling herself for a couple of days and had experienced subjective fevers, chills, productive cough, nausea, diarrhea.  She has apparently been exposed to what sounds like a viral GI illness through her work (co-workers with vomiting and diarrhea for a few days).  Has used insulin pump as usual, no known malfunctions.  Pt seen for MST. BMI indicates normal weight. Diet advancement from NPO to CLD at 9:40 AM today. Lunch was ordered a short time ago and not yet delivered. Pt reports feeling better today than she did yesterday. She had a good appetite with no changes until a few days ago when symptoms started.   Per rounds this AM, with has an insulin pump but her daughter took it home last night. It is thought that N/V/D may be d/t viral infection as several of pt's coworkers have been sick with similar symptoms recently. Dr. Lake Bells reports plan to order oral Reglan given hx of gastroparesis.   Physical assessment deferred to follow-up, per request. Per chart review, weight has been stable since February. On 04/15/17 she weighed 118 lbs at [place].  Medications reviewed; 100 mcg oral Synthroid/day, 4 g IV Mg sulfate x1 run today, 10 mg oral Reglan BID, 4  mg IV Zofran x1 dose yesterday and ordered PRN, 30 mmol IV KPhos x1 run today.  Labs reviewed; CBGs: 113-243 mg/dL since midnight, Na: 130 mmol/L, K: 2.9 mmol/L, Ca: 8.2 mg/dL.  IVF: 1/2 NS-20 mEq KCl @ 75 mL/hr.    Diet Order:  Diet clear liquid Room service appropriate? Yes; Fluid consistency: Thin  Skin:  Reviewed, no issues  Last BM:  PTA/unknown  Height:   Ht Readings from Last 1 Encounters:  05/05/17 5\' 5"  (1.651 m)    Weight:   Wt Readings from Last 1 Encounters:  05/05/17 117 lb 4.6 oz (53.2 kg)    Ideal Body Weight:  56.82 kg  BMI:  Body mass index is 19.52 kg/m.  Estimated Nutritional Needs:   Kcal:  1330-1600 (25-30 kcal/kg)  Protein:  53-64 grams (1-1.2 grams/kg)  Fluid:  >/= 1.8 L/day  EDUCATION NEEDS:   No education needs identified at this time    Jarome Matin, MS, RD, LDN, CNSC Inpatient Clinical Dietitian Pager # [phone] After hours/weekend pager # [phone]

## 2017-05-06 NOTE — Progress Notes (Signed)
eLink Physician-Brief Progress Note Patient Name: Karen Hayden DOB: [birth year] MRN: [id number]   Date of Service  05/06/2017  HPI/Events of Note  Hypophos, hypomag, and moderately low potassium  eICU Interventions  Phos, mag and potassium replaced     Intervention Category Intermediate Interventions: Electrolyte abnormality - evaluation and management  DETERDING,ELIZABETH 05/06/2017, 5:29 AM

## 2017-05-07 ENCOUNTER — Telehealth: Payer: Self-pay | Admitting: *Deleted

## 2017-05-07 ENCOUNTER — Inpatient Hospital Stay (HOSPITAL_COMMUNITY): Payer: Medicare Other

## 2017-05-07 DIAGNOSIS — E108 Type 1 diabetes mellitus with unspecified complications: Secondary | ICD-10-CM

## 2017-05-07 DIAGNOSIS — R1310 Dysphagia, unspecified: Secondary | ICD-10-CM

## 2017-05-07 DIAGNOSIS — E1143 Type 2 diabetes mellitus with diabetic autonomic (poly)neuropathy: Secondary | ICD-10-CM

## 2017-05-07 DIAGNOSIS — E876 Hypokalemia: Secondary | ICD-10-CM

## 2017-05-07 LAB — GLUCOSE, CAPILLARY
GLUCOSE-CAPILLARY: 100 mg/dL — AB (ref 65–99)
GLUCOSE-CAPILLARY: 212 mg/dL — AB (ref 65–99)
GLUCOSE-CAPILLARY: 261 mg/dL — AB (ref 65–99)
GLUCOSE-CAPILLARY: 281 mg/dL — AB (ref 65–99)
GLUCOSE-CAPILLARY: 291 mg/dL — AB (ref 65–99)
GLUCOSE-CAPILLARY: 298 mg/dL — AB (ref 65–99)
Glucose-Capillary: 286 mg/dL — ABNORMAL HIGH (ref 65–99)
Glucose-Capillary: 100 mg/dL — ABNORMAL HIGH (ref 65–99)
Glucose-Capillary: 374 mg/dL — ABNORMAL HIGH (ref 65–99)
Glucose-Capillary: 432 mg/dL — ABNORMAL HIGH (ref 65–99)
Glucose-Capillary: 285 mg/dL — ABNORMAL HIGH (ref 65–99)
Glucose-Capillary: 142 mg/dL — ABNORMAL HIGH (ref 65–99)
Glucose-Capillary: 70 mg/dL (ref 65–99)

## 2017-05-07 LAB — BASIC METABOLIC PANEL
ANION GAP: 18 — AB (ref 5–15)
Anion gap: 11 (ref 5–15)
Anion gap: 8 (ref 5–15)
BUN: 6 mg/dL (ref 6–20)
BUN: 9 mg/dL (ref 6–20)
BUN: 6 mg/dL (ref 6–20)
CHLORIDE: 107 mmol/L (ref 101–111)
CHLORIDE: 108 mmol/L (ref 101–111)
CO2: 15 mmol/L — ABNORMAL LOW (ref 22–32)
CO2: 12 mmol/L — AB (ref 22–32)
CO2: 20 mmol/L — AB (ref 22–32)
CREATININE: 0.63 mg/dL (ref 0.44–1.00)
CREATININE: 0.48 mg/dL (ref 0.44–1.00)
Calcium: 8.3 mg/dL — ABNORMAL LOW (ref 8.9–10.3)
Calcium: 8.4 mg/dL — ABNORMAL LOW (ref 8.9–10.3)
Calcium: 8 mg/dL — ABNORMAL LOW (ref 8.9–10.3)
Chloride: 99 mmol/L — ABNORMAL LOW (ref 101–111)
Creatinine, Ser: 0.64 mg/dL (ref 0.44–1.00)
GFR calc Af Amer: >60 mL/min (ref >60)
GFR calc Af Amer: >60 mL/min (ref >60)
GFR calc Af Amer: >60 mL/min (ref >60)
GFR calc non Af Amer: >60 mL/min (ref >60)
GFR calc non Af Amer: >60 mL/min (ref >60)
GFR calc non Af Amer: >60 mL/min (ref >60)
GLUCOSE: 299 mg/dL — AB (ref 65–99)
GLUCOSE: 474 mg/dL — AB (ref 65–99)
Glucose, Bld: 178 mg/dL — ABNORMAL HIGH (ref 65–99)
POTASSIUM: 3.1 mmol/L — AB (ref 3.5–5.1)
POTASSIUM: 4.2 mmol/L (ref 3.5–5.1)
Potassium: 3.2 mmol/L — ABNORMAL LOW (ref 3.5–5.1)
Sodium: 133 mmol/L — ABNORMAL LOW (ref 135–145)
Sodium: 129 mmol/L — ABNORMAL LOW (ref 135–145)
Sodium: 136 mmol/L (ref 135–145)

## 2017-05-07 LAB — PHOSPHORUS: PHOSPHORUS: 1.5 mg/dL — AB (ref 2.5–4.6)

## 2017-05-07 LAB — MAGNESIUM: Magnesium: 1.7 mg/dL (ref 1.7–2.4)

## 2017-05-07 MED ORDER — SODIUM CHLORIDE 0.9 % IV SOLN
INTRAVENOUS | Status: AC
  Administered 2017-05-07: 18:00:00 via INTRAVENOUS

## 2017-05-07 MED ORDER — INSULIN ASPART 100 UNIT/ML ~~LOC~~ SOLN: 0.0000 [IU] | Freq: Every day | SUBCUTANEOUS | Status: DC

## 2017-05-07 MED ORDER — SUCRALFATE 1 GM/10ML PO SUSP
1.0000 g | Freq: Three times a day (TID) | ORAL | Status: DC
  Administered 2017-05-07 – 2017-05-10 (×13): 1 g via ORAL
  Filled 2017-05-07 (×13): qty 10

## 2017-05-07 MED ORDER — DEXTROSE-NACL 5-0.45 % IV SOLN
INTRAVENOUS | Status: DC
  Administered 2017-05-07: 21:00:00 via INTRAVENOUS

## 2017-05-07 MED ORDER — INSULIN ASPART 100 UNIT/ML ~~LOC~~ SOLN: 6.0000 [IU] | Freq: Three times a day (TID) | SUBCUTANEOUS | Status: DC

## 2017-05-07 MED ORDER — INSULIN ASPART 100 UNIT/ML ~~LOC~~ SOLN: 0.0000 [IU] | Freq: Three times a day (TID) | SUBCUTANEOUS | Status: DC

## 2017-05-07 MED ORDER — K PHOS MONO-SOD PHOS DI & MONO 155-852-130 MG PO TABS
500.0000 mg | ORAL_TABLET | Freq: Three times a day (TID) | ORAL | Status: DC
  Administered 2017-05-07: 500 mg via ORAL
  Filled 2017-05-07 (×2): qty 2

## 2017-05-07 MED ORDER — METOCLOPRAMIDE HCL 10 MG PO TABS
10.0000 mg | ORAL_TABLET | Freq: Three times a day (TID) | ORAL | Status: DC
  Administered 2017-05-07 (×3): 10 mg via ORAL
  Filled 2017-05-07 (×3): qty 1

## 2017-05-07 MED ORDER — POTASSIUM PHOSPHATES 15 MMOLE/5ML IV SOLN
30.0000 mmol | Freq: Once | INTRAVENOUS | Status: AC
  Administered 2017-05-07: 30 mmol via INTRAVENOUS
  Filled 2017-05-07: qty 10

## 2017-05-07 MED ORDER — SODIUM CHLORIDE 0.9 % IV SOLN
INTRAVENOUS | Status: DC
  Administered 2017-05-07: 2.3 [IU]/h via INTRAVENOUS
  Filled 2017-05-07: qty 1

## 2017-05-07 MED ORDER — INSULIN ASPART 100 UNIT/ML ~~LOC~~ SOLN
15.0000 [IU] | Freq: Once | SUBCUTANEOUS | Status: AC
  Administered 2017-05-07: 15 [IU] via SUBCUTANEOUS

## 2017-05-07 MED ORDER — INSULIN GLARGINE 100 UNIT/ML ~~LOC~~ SOLN
18.0000 [IU] | Freq: Every day | SUBCUTANEOUS | Status: DC
  Administered 2017-05-07: 18 [IU] via SUBCUTANEOUS
  Filled 2017-05-07: qty 0.18

## 2017-05-07 MED ORDER — POTASSIUM CHLORIDE CRYS ER 20 MEQ PO TBCR
40.0000 meq | EXTENDED_RELEASE_TABLET | Freq: Once | ORAL | Status: AC
  Administered 2017-05-07: 40 meq via ORAL
  Filled 2017-05-07: qty 2

## 2017-05-07 MED ORDER — INSULIN ASPART 100 UNIT/ML ~~LOC~~ SOLN
2.0000 [IU] | SUBCUTANEOUS | Status: DC
  Administered 2017-05-07 (×2): 6 [IU] via SUBCUTANEOUS

## 2017-05-07 MED ORDER — MAGNESIUM SULFATE 2 GM/50ML IV SOLN
2.0000 g | Freq: Once | INTRAVENOUS | Status: AC
  Administered 2017-05-07: 2 g via INTRAVENOUS
  Filled 2017-05-07: qty 50

## 2017-05-07 MED ORDER — SODIUM CHLORIDE 0.9 % IV SOLN
INTRAVENOUS | Status: DC
  Administered 2017-05-07 – 2017-05-09 (×4): via INTRAVENOUS

## 2017-05-07 MED ORDER — POTASSIUM CHLORIDE 10 MEQ/100ML IV SOLN
10.0000 meq | INTRAVENOUS | Status: DC
Start: 2017-05-07 — End: 2017-05-07

## 2017-05-07 MED ORDER — INSULIN ASPART 100 UNIT/ML ~~LOC~~ SOLN
0.0000 [IU] | Freq: Three times a day (TID) | SUBCUTANEOUS | Status: DC
  Administered 2017-05-08 (×2): 2 [IU] via SUBCUTANEOUS
  Administered 2017-05-08: 3 [IU] via SUBCUTANEOUS
  Administered 2017-05-09: 1 [IU] via SUBCUTANEOUS
  Administered 2017-05-09 (×2): 2 [IU] via SUBCUTANEOUS
  Administered 2017-05-10: 3 [IU] via SUBCUTANEOUS

## 2017-05-07 MED ORDER — INSULIN ASPART 100 UNIT/ML ~~LOC~~ SOLN: 0.0000 [IU] | SUBCUTANEOUS | Status: DC

## 2017-05-07 MED ORDER — MAGIC MOUTHWASH
10.0000 mL | Freq: Three times a day (TID) | ORAL | Status: DC
  Administered 2017-05-07 – 2017-05-10 (×10): 10 mL via ORAL
  Filled 2017-05-07 (×10): qty 10

## 2017-05-07 MED ORDER — PROMETHAZINE HCL 25 MG/ML IJ SOLN
12.5000 mg | Freq: Four times a day (QID) | INTRAMUSCULAR | Status: DC | PRN
Start: 2017-05-07 — End: 2017-05-10
  Administered 2017-05-07 – 2017-05-08 (×4): 12.5 mg via INTRAVENOUS
  Filled 2017-05-07 (×3): qty 1

## 2017-05-07 NOTE — Progress Notes (Signed)
Inpatient Diabetes Program Recommendations  AACE/ADA: New Consensus Statement on Inpatient Glycemic Control (2015)  Target Ranges:  Prepandial:   less than 140 mg/dL      Peak postprandial:   less than 180 mg/dL (1-2 hours)      Critically ill patients:  140 - 180 mg/dL   Lab Results  Component Value Date   GLUCAP 374 (H) 05/07/2017    Review of Glycemic Control  "I don't think my pump is working right." Pt agrees to remove pump and start basal-bolus insulin. Since pt is eating very little, would not recommend 70/30 at this time.  Pump settings: 16.6 units - basal CHO ratio - 1:10 Sensitivity - 50 (1 units drops blood sugar 50 mg/dL) Goal - 140 mg/dL  Glucose continues to be elevated despite bolusing her insulin pump.  Inpatient Diabetes Program Recommendations:    BMET to check CO2, K+ and AG Lantus 18 units Q24H Novolog 0-9 units tidwc and hs Novolog 6 units tidwc for meal coverage insulin    (1:10 ratio)  Needs appt with endo prior to restarting pump. For discharge - recommend Novolin 70/30 12 units bid  Discussed above with RN.  Thank you. Lorenda Peck, RD, LDN, CDE Inpatient Diabetes Coordinator [phone]

## 2017-05-07 NOTE — Progress Notes (Addendum)
[facility] PROGRESS NOTE  Karen Hayden [id number]:[id number] DOB: [birth year] DOA: 05/05/2017  PCP: Jacelyn Pi, MD  Brief History/Interval Summary: 41 year old Caucasian female with type 1 diabetes on insulin pump, presented with fever, chills, cough, diarrhea and was found to have diabetic ketoacidosis. She was admitted to the intensive care unit. Subsequently transitioned to stepdown.  Reason for Visit: Poorly controlled diabetes  Consultants: None  Procedures: None  Antibiotics: None  Subjective/Interval History: Patient feels somewhat fatigued. Continues to be nauseated. Complains of poor appetite. No abdominal pain. Complains of pain when she swallows. Hasn't had any emesis.   ROS: No abdominal pain.  Objective:  Vital Signs  Vitals:   05/07/17 0411 05/07/17 0500 05/07/17 0600 05/07/17 0800  BP:    130/84  Pulse:  82 84 81  Resp:  (!) 25 (!) 24 13  Temp: 98.4 F (36.9 C)   (!) 97.5 F (36.4 C)  TempSrc: Oral   Oral  SpO2:  99% 99% 100%  Weight:      Height:        Intake/Output Summary (Last 24 hours) at 05/07/17 1037 Last data filed at 05/07/17 0800  Gross per 24 hour  Intake          2305.39 ml  Output             1800 ml  Net           505.39 ml   Filed Weights   05/05/17 1247 05/05/17 2010  Weight: 53.5 kg (118 lb) 53.2 kg (117 lb 4.6 oz)    General appearance: alert, cooperative, appears stated age and no distress Resp: clear to auscultation bilaterally Cardio: regular rate and rhythm, S1, S2 normal, no murmur, click, rub or gallop GI: soft, non-tender; bowel sounds normal; no masses,  no organomegaly Extremities: extremities normal, atraumatic, no cyanosis or edema Neurologic: No focal deficits  Lab Results:  Data Reviewed: I have personally reviewed following labs and imaging studies  CBC:  Recent Labs Lab 05/05/17 1345 05/06/17 0405  WBC 37.3* 20.3*  NEUTROABS 33.2*  --   HGB 13.0 10.6*  HCT 38.4 31.1*  MCV 81.9 79.3  PLT  472* 532    Basic Metabolic Panel:  Recent Labs Lab 05/05/17 1345  05/06/17 0405 05/06/17 0833 05/06/17 1022 05/06/17 1338 05/06/17 1733 05/07/17 0333  NA 124*  < > 137 130* 133* 132* 136 133*  K 5.7*  < > 3.6 2.9* 3.1* 3.2* 3.1* 3.1*  CL 93*  < > 113* 109 110 107 112* 107  CO2 <7*  < > 15* 12* 16* 14* 19* 15*  GLUCOSE 587*  < > 111* 197* 134* 176* 193* 299*  BUN 25*  < > 9 7 7  <5* <5* 6  CREATININE 1.38*  < > 0.63 0.65 0.62 0.61 0.56 0.63  CALCIUM 9.3  < > 8.2* 8.2* 8.2* 8.0* 8.3* 8.3*  MG 1.8  --  1.3*  --   --   --   --  1.7  PHOS  --   --  1.8*  --   --   --   --  1.5*  < > = values in this interval not displayed.  GFR: Estimated Creatinine Clearance: 76.9 mL/min (by C-G formula based on SCr of 0.63 mg/dL).  Cardiac Enzymes:  Recent Labs Lab 05/05/17 1345  TROPONINI <0.03    CBG:  Recent Labs Lab 05/06/17 2327 05/07/17 0030 05/07/17 0217 05/07/17 0356 05/07/17 0757  GLUCAP 319* 291* 286*  281* 100*    Thyroid Function Tests:  Recent Labs  05/05/17 1345  TSH 0.011*      Recent Results (from the past 240 hour(s))  Culture, blood (Routine X 2) w Reflex to ID Panel     Status: None (Preliminary result)   Collection Time: 05/05/17  5:09 PM  Result Value Ref Range Status   Specimen Description BLOOD LEFT ANTECUBITAL  Final   Special Requests   Final    BOTTLES DRAWN AEROBIC AND ANAEROBIC Blood Culture adequate volume   Culture   Final    NO GROWTH < 12 HOURS Performed at [facility], [place] [address] [place], [place] [address]    Report Status PENDING  Incomplete  Culture, blood (Routine X 2) w Reflex to ID Panel     Status: None (Preliminary result)   Collection Time: 05/05/17  5:14 PM  Result Value Ref Range Status   Specimen Description BLOOD RIGHT WRIST  Final   Special Requests IN PEDIATRIC BOTTLE Blood Culture adequate volume  Final   Culture   Final    NO GROWTH < 12 HOURS Performed at [facility], [place] [address]  [place], [place] [address]    Report Status PENDING  Incomplete  MRSA PCR Screening     Status: None   Collection Time: 05/05/17 11:00 PM  Result Value Ref Range Status   MRSA by PCR NEGATIVE NEGATIVE Final    Comment:        The GeneXpert MRSA Assay (FDA approved for NASAL specimens only), is one component of a comprehensive MRSA colonization surveillance program. It is not intended to diagnose MRSA infection nor to guide or monitor treatment for MRSA infections.       Radiology Studies: Dg Chest 2 View  Result Date: 05/07/2017 CLINICAL DATA:  Chest pain EXAM: CHEST  2 VIEW COMPARISON:  May 05, 2017 FINDINGS: There is left base atelectatic change. There is a questionable small left pleural effusion. There is no edema or consolidation. The heart size and pulmonary vascularity are normal. No adenopathy. No pneumothorax. No bone lesions. IMPRESSION: Left base atelectasis with questionable small left pleural effusion. No frank edema or consolidation. Lungs elsewhere clear. No pneumothorax. Cardiac silhouette within normal limits. Electronically Signed   By: Lowella Grip III M.D.   On: 05/07/2017 09:13   Portable Chest X-ray (1 View)  Result Date: 05/05/2017 CLINICAL DATA:  Dyspnea. EXAM: PORTABLE CHEST 1 VIEW COMPARISON:  Radiographs of April 18, 2017. FINDINGS: The heart size and mediastinal contours are within normal limits. Both lungs are clear. No pneumothorax or pleural effusion is noted. The visualized skeletal structures are unremarkable. IMPRESSION: No acute cardiopulmonary abnormality seen. Electronically Signed   By: Marijo Conception, M.D.   On: 05/05/2017 17:26     Medications:  Scheduled: . gabapentin  300 mg Oral TID  . heparin  5,000 Units Subcutaneous Q8H  . insulin aspart  2-6 Units Subcutaneous Q4H  . insulin pump   Subcutaneous Q4H  . levothyroxine  100 mcg Oral QAC breakfast  . magic mouthwash  10 mL Oral TID  . metoCLOPramide  10 mg Oral TID AC & HS  .  phosphorus  500 mg Oral TID  . sucralfate  1 g Oral TID WC & HS   Continuous: . 0.45 % NaCl with KCl 20 mEq / L 75 mL/hr at 05/07/17 0003   KGM:[id number], promethazine, promethazine  Assessment/Plan:  Active Problems:   Type 1 diabetes mellitus (  [place])   Diabetic gastroparesis ([place])   Diabetic ketoacidosis without coma associated with type 1 diabetes mellitus ([place])    Diabetic ketoacidosis in the setting of type 1 diabetes. It appears the patient has had problems with getting supplies for the pump. She is followed by an endocrinologist. It also appears that there are some financial reasons for this. Diabetes coordinator is following the patient. Will discuss with them if it makes sense to take her off the pump and place her on alternative insulin till all these issues are sorted out as an outpatient by her endocrinologist. HbA1c will be ordered. Her bicarbonate level noted to be lower today, but anion gap was still normal. Continue to monitor CBGs.  Diabetic gastroparesis. Increase Reglan to 4 times a day. Patient mentions some difficulty swallowing. She attributes this to multiple episodes of nausea, vomiting that she had over the last many days. CXR was done which did not show any acute findings. She will be given Carafate and magic mouthwash.  Acute bronchitis. Appears to be resolving.  Hypokalemia and hypophosphatemia and hyponatremia Being managed by Pharmacy  Leukocytosis. Patient is afebrile. WBC has been improving. Elevated WBC could be due to hemoconcentration/dehydration. Repeat labs tomorrow. She is not on any antibiotics currently.  Normocytic anemia. Drop in hemoglobin is due to dilution. No evidence for overt bleeding.   Hypothyroidism Low TSH noted. Will obtain free T4. Continue levothyroxine for now.  History of seizure disorder and bipolar disorder. She is on gabapentin, which is being continued.  DVT Prophylaxis: Subcutaneous heparin    Code Status: Full  code  Family Communication: Discussed with the patient  Disposition Plan: Management as outlined above. Mobilize as tolerated.    LOS: 2 days   [facility] Pager [phone] 05/07/2017, 10:37 AM  If 7PM-7AM, please contact night-coverage at [email or web address], password [facility]

## 2017-05-07 NOTE — Progress Notes (Signed)
Inpatient Diabetes Program Recommendations  AACE/ADA: New Consensus Statement on Inpatient Glycemic Control (2015)  Target Ranges:  Prepandial:   less than 140 mg/dL      Peak postprandial:   less than 180 mg/dL (1-2 hours)      Critically ill patients:  140 - 180 mg/dL   Lab Results  Component Value Date   GLUCAP 100 (H) 05/07/2017    Review of Glycemic Control  Pt states she's nauseated. Breakfast tray has not been touched. Pt states she bolused 3x during the night, and blood sugars remained elevated.  Lab glucose 299 at 0333.  CO2 - dropped to 15 AG - increased to 11 CBG at 0800 - 100 mg/dL  Novolog 2-4-6 units Q4H is ordered.  Inpatient Diabetes Program Recommendations:    Pump settings - Basal - approx 28 units Bolus - 1:10 CHO ratio, CF - 50, Goal 140 mg/dL  Recommendations: D/C Novolog 2-4-6. Insulin pump does not recognize pt is receiving SQ insulin. If CBGs continue > 180 mg/dL, will need to d/c insulin pump and give basal-bolus insulin. Need HgbA1C to assess glycemic control PTA.   Will watch closely throughout the day.  Thank you. Lorenda Peck, RD, LDN, CDE Inpatient Diabetes Coordinator [phone]

## 2017-05-07 NOTE — Telephone Encounter (Signed)
No show letter mailed to patient. 

## 2017-05-08 DIAGNOSIS — E039 Hypothyroidism, unspecified: Secondary | ICD-10-CM

## 2017-05-08 LAB — CBC
HCT: 29.2 % — ABNORMAL LOW (ref 36.0–46.0)
Hemoglobin: 10.1 g/dL — ABNORMAL LOW (ref 12.0–15.0)
MCH: 27.7 pg (ref 26.0–34.0)
MCHC: 34.6 g/dL (ref 30.0–36.0)
MCV: 80.2 fL (ref 78.0–100.0)
PLATELETS: 214 10*3/uL (ref 150–400)
RBC: 3.64 MIL/uL — AB (ref 3.87–5.11)
RDW: 13.8 % (ref 11.5–15.5)
WBC: 7.7 10*3/uL (ref 4.0–10.5)

## 2017-05-08 LAB — BASIC METABOLIC PANEL
ANION GAP: 9 (ref 5–15)
ANION GAP: 9 (ref 5–15)
Anion gap: 11 (ref 5–15)
BUN: 6 mg/dL (ref 6–20)
CALCIUM: 8.6 mg/dL — AB (ref 8.9–10.3)
CHLORIDE: 112 mmol/L — AB (ref 101–111)
CHLORIDE: 110 mmol/L (ref 101–111)
CHLORIDE: 104 mmol/L (ref 101–111)
CO2: 18 mmol/L — ABNORMAL LOW (ref 22–32)
CO2: 19 mmol/L — ABNORMAL LOW (ref 22–32)
CO2: 18 mmol/L — ABNORMAL LOW (ref 22–32)
CREATININE: 0.58 mg/dL (ref 0.44–1.00)
Calcium: 8.4 mg/dL — ABNORMAL LOW (ref 8.9–10.3)
Calcium: 8.5 mg/dL — ABNORMAL LOW (ref 8.9–10.3)
Creatinine, Ser: 0.45 mg/dL (ref 0.44–1.00)
Creatinine, Ser: 0.55 mg/dL (ref 0.44–1.00)
GFR calc Af Amer: >60 mL/min (ref >60)
GFR calc Af Amer: >60 mL/min (ref >60)
GFR calc non Af Amer: >60 mL/min (ref >60)
GFR calc non Af Amer: >60 mL/min (ref >60)
GFR calc non Af Amer: >60 mL/min (ref >60)
GLUCOSE: 79 mg/dL (ref 65–99)
GLUCOSE: 108 mg/dL — AB (ref 65–99)
Glucose, Bld: 242 mg/dL — ABNORMAL HIGH (ref 65–99)
POTASSIUM: 4 mmol/L (ref 3.5–5.1)
POTASSIUM: 3.8 mmol/L (ref 3.5–5.1)
Potassium: 3.3 mmol/L — ABNORMAL LOW (ref 3.5–5.1)
SODIUM: 138 mmol/L (ref 135–145)
SODIUM: 133 mmol/L — AB (ref 135–145)
Sodium: 139 mmol/L (ref 135–145)

## 2017-05-08 LAB — GLUCOSE, CAPILLARY
GLUCOSE-CAPILLARY: 168 mg/dL — AB (ref 65–99)
GLUCOSE-CAPILLARY: 234 mg/dL — AB (ref 65–99)
GLUCOSE-CAPILLARY: 90 mg/dL (ref 65–99)
Glucose-Capillary: 100 mg/dL — ABNORMAL HIGH (ref 65–99)
Glucose-Capillary: 176 mg/dL — ABNORMAL HIGH (ref 65–99)
Glucose-Capillary: 179 mg/dL — ABNORMAL HIGH (ref 65–99)

## 2017-05-08 LAB — HEMOGLOBIN A1C
HEMOGLOBIN A1C: 9.1 % — AB (ref 4.8–5.6)
Mean Plasma Glucose: 214.47 mg/dL

## 2017-05-08 LAB — T4, FREE: FREE T4: 1.26 ng/dL — AB (ref 0.61–1.12)

## 2017-05-08 LAB — PHOSPHORUS: Phosphorus: 2.6 mg/dL (ref 2.5–4.6)

## 2017-05-08 LAB — MAGNESIUM: Magnesium: 1.7 mg/dL (ref 1.7–2.4)

## 2017-05-08 MED ORDER — POTASSIUM CHLORIDE 10 MEQ/100ML IV SOLN
10.0000 meq | INTRAVENOUS | Status: AC
  Administered 2017-05-08 (×4): 10 meq via INTRAVENOUS
  Filled 2017-05-08 (×4): qty 100

## 2017-05-08 MED ORDER — LOPERAMIDE HCL 2 MG PO CAPS
4.0000 mg | ORAL_CAPSULE | Freq: Once | ORAL | Status: AC
  Administered 2017-05-08: 4 mg via ORAL
  Filled 2017-05-08: qty 2

## 2017-05-08 MED ORDER — INSULIN GLARGINE 100 UNIT/ML ~~LOC~~ SOLN
18.0000 [IU] | Freq: Every day | SUBCUTANEOUS | Status: DC
  Administered 2017-05-08 – 2017-05-09 (×2): 18 [IU] via SUBCUTANEOUS
  Filled 2017-05-08 (×3): qty 0.18

## 2017-05-08 MED ORDER — SACCHAROMYCES BOULARDII 250 MG PO CAPS
250.0000 mg | ORAL_CAPSULE | Freq: Two times a day (BID) | ORAL | Status: DC
  Administered 2017-05-08 – 2017-05-10 (×5): 250 mg via ORAL
  Filled 2017-05-08 (×6): qty 1

## 2017-05-08 MED ORDER — METOCLOPRAMIDE HCL 5 MG PO TABS
5.0000 mg | ORAL_TABLET | Freq: Three times a day (TID) | ORAL | Status: DC
  Administered 2017-05-08 – 2017-05-10 (×8): 5 mg via ORAL
  Filled 2017-05-08 (×8): qty 1

## 2017-05-08 MED ORDER — LOPERAMIDE HCL 2 MG PO CAPS
4.0000 mg | ORAL_CAPSULE | Freq: Three times a day (TID) | ORAL | Status: DC | PRN
  Administered 2017-05-08 (×2): 4 mg via ORAL
  Filled 2017-05-08 (×2): qty 2

## 2017-05-08 MED ORDER — POTASSIUM CHLORIDE CRYS ER 20 MEQ PO TBCR: 40.0000 meq | EXTENDED_RELEASE_TABLET | Freq: Once | ORAL | Status: DC

## 2017-05-08 MED ORDER — POTASSIUM CHLORIDE CRYS ER 20 MEQ PO TBCR
40.0000 meq | EXTENDED_RELEASE_TABLET | Freq: Once | ORAL | Status: AC
  Administered 2017-05-08: 40 meq via ORAL
  Filled 2017-05-08: qty 2

## 2017-05-08 NOTE — Progress Notes (Signed)
Inpatient Diabetes Program Recommendations  AACE/ADA: New Consensus Statement on Inpatient Glycemic Control (2015)  Target Ranges:  Prepandial:   less than 140 mg/dL      Peak postprandial:   less than 180 mg/dL (1-2 hours)      Critically ill patients:  140 - 180 mg/dL   Results for Karen Hayden, Karen Hayden (MRN [id number]) as of 05/08/2017 10:21  Ref. Range 05/07/2017 07:57 05/07/2017 11:39 05/07/2017 13:21 05/07/2017 15:19 05/07/2017 17:52 05/07/2017 18:49 05/07/2017 19:51 05/07/2017 21:13 05/07/2017 22:04 05/07/2017 23:19 05/08/2017 03:37 05/08/2017 07:38  Glucose-Capillary Latest Ref Range: 65 - 99 mg/dL 100 (H) 298 (H) 374 (H) 432 (H) 285 (H) 261 (H) 212 (H) 142 (H) 100 (H) 70 100 (H) 168 (H)   Review of Glycemic Control  Diabetes history: DM1 Outpatient Diabetes medications: Insulin pump (Pump settings: 16.6 units - basal; CHO ratio - 1:10; Sensitivity - 50 (1 units drops blood sugar 50 mg/dL); Goal - 140 mg/dL Current orders for Inpatient glycemic control: Lantus 18 units QD, Novolog 0-9 units TID with meals  Inpatient Diabetes Program Recommendations: Correction (SSI): Please consider ordering Novolog 0-5 units QHS for bedtime correction. Insulin - Meal Coverage: Patient has DM1 and requires insulin for carbohydrates consumed. Please order Novolog 5 units TID with meals for meal coverage if patient eats at least 50% of meals.  Thanks, Barnie Alderman, RN, MSN, CDE Diabetes Coordinator Inpatient Diabetes Program [phone] (Team Pager from 8am to 5pm)

## 2017-05-08 NOTE — Progress Notes (Signed)
Pt reports fluttering sensation in chest. EKG obtained. MD made aware. No new orders at this time. Will continue to monitor.

## 2017-05-08 NOTE — Progress Notes (Signed)
[facility] PROGRESS NOTE  Karen Hayden CWU:[id number] DOB: December 23, 1975 DOA: 05/05/2017  PCP: Jacelyn Pi, MD  Brief History/Interval Summary: 41 year old Caucasian female with type 1 diabetes on insulin pump, presented with fever, chills, cough, diarrhea and was found to have diabetic ketoacidosis. She was admitted to the intensive care unit. Subsequently transitioned to stepdown.  Reason for Visit: Poorly controlled diabetes  Consultants: None  Procedures: None  Antibiotics: None  Subjective/Interval History: Patient states that she is feeling much better this morning. Denies any nausea, vomiting. Her swallowing difficulty has almost resolved. Mentions dark stools and frequent loose stools since yesterday. Denies any abdominal pain.   ROS: Denies any chest pain or shortness of breath  Objective:  Vital Signs  Vitals:   05/07/17 2129 05/07/17 2352 05/08/17 0000 05/08/17 0421  BP: 130/67  (!) 124/55   Pulse: 84  84   Resp: 16  20   Temp:  97.9 F (36.6 C)  97.9 F (36.6 C)  TempSrc:  Oral  Oral  SpO2: 100%  98%   Weight:      Height:        Intake/Output Summary (Last 24 hours) at 05/08/17 0750 Last data filed at 05/08/17 0014  Gross per 24 hour  Intake             1025 ml  Output             2151 ml  Net            -1126 ml   Filed Weights   05/05/17 1247 05/05/17 2010  Weight: 53.5 kg (118 lb) 53.2 kg (117 lb 4.6 oz)    General appearance: Awake, alert. In no distress. Resp: Clear to auscultation bilaterally. Cardio: S1, S2 is normal, regular. No S3 , S4. No rubs, murmurs, or bruit GI: Abdomen is soft. Nontender, nondistended. Bowel sounds are present. No masses, organomegaly Extremities: No edema Neurologic: No focal neurological deficits  Lab Results:  Data Reviewed: I have personally reviewed following labs and imaging studies  CBC:  Recent Labs Lab 05/05/17 1345 05/06/17 0405 05/08/17 0351  WBC 37.3* 20.3* 7.7  NEUTROABS 33.2*   --   --   HGB 13.0 10.6* 10.1*  HCT 38.4 31.1* 29.2*  MCV 81.9 79.3 80.2  PLT 472* 302 388    Basic Metabolic Panel:  Recent Labs Lab 05/05/17 1345  05/06/17 0405  05/07/17 0333 05/07/17 1535 05/07/17 2054 05/08/17 0127 05/08/17 0351  NA 124*  < > 137  < > 133* 129* 136 139 138  K 5.7*  < > 3.6  < > 3.1* 4.2 3.2* 4.0 3.8  CL 93*  < > 113*  < > 107 99* 108 112* 110  CO2 <7*  < > 15*  < > 15* 12* 20* 18* 19*  GLUCOSE 587*  < > 111*  < > 299* 474* 178* 79 108*  BUN 25*  < > 9  < > 6 9 6  <5* <5*  CREATININE 1.38*  < > 0.63  < > 0.63 0.64 0.48 0.45 0.55  CALCIUM 9.3  < > 8.2*  < > 8.3* 8.4* 8.0* 8.4* 8.5*  MG 1.8  --  1.3*  --  1.7  --   --   --  1.7  PHOS  --   --  1.8*  --  1.5*  --   --   --  2.6  < > = values in this interval not displayed.  GFR: Estimated  Creatinine Clearance: 76.9 mL/min (by C-G formula based on SCr of 0.55 mg/dL).  Cardiac Enzymes:  Recent Labs Lab 05/05/17 1345  TROPONINI <0.03    CBG:  Recent Labs Lab 05/07/17 2113 05/07/17 2204 05/07/17 2319 05/08/17 0337 05/08/17 0738  GLUCAP 142* 100* 70 100* 168*    Thyroid Function Tests:  Recent Labs  05/05/17 1345  TSH 0.011*      Recent Results (from the past 240 hour(s))  Culture, blood (Routine X 2) w Reflex to ID Panel     Status: None (Preliminary result)   Collection Time: 05/05/17  5:09 PM  Result Value Ref Range Status   Specimen Description BLOOD LEFT ANTECUBITAL  Final   Special Requests   Final    BOTTLES DRAWN AEROBIC AND ANAEROBIC Blood Culture adequate volume   Culture   Final    NO GROWTH 2 DAYS Performed at [facility], [place] [address], [place], [place] [address]    Report Status PENDING  Incomplete  Culture, blood (Routine X 2) w Reflex to ID Panel     Status: None (Preliminary result)   Collection Time: 05/05/17  5:14 PM  Result Value Ref Range Status   Specimen Description BLOOD RIGHT WRIST  Final   Special Requests IN PEDIATRIC BOTTLE Blood Culture  adequate volume  Final   Culture   Final    NO GROWTH 2 DAYS Performed at [facility], [place] [address] [place], [place] [address]    Report Status PENDING  Incomplete  MRSA PCR Screening     Status: None   Collection Time: 05/05/17 11:00 PM  Result Value Ref Range Status   MRSA by PCR NEGATIVE NEGATIVE Final    Comment:        The GeneXpert MRSA Assay (FDA approved for NASAL specimens only), is one component of a comprehensive MRSA colonization surveillance program. It is not intended to diagnose MRSA infection nor to guide or monitor treatment for MRSA infections.       Radiology Studies: Dg Chest 2 View  Result Date: 05/07/2017 CLINICAL DATA:  Chest pain EXAM: CHEST  2 VIEW COMPARISON:  May 05, 2017 FINDINGS: There is left base atelectatic change. There is a questionable small left pleural effusion. There is no edema or consolidation. The heart size and pulmonary vascularity are normal. No adenopathy. No pneumothorax. No bone lesions. IMPRESSION: Left base atelectasis with questionable small left pleural effusion. No frank edema or consolidation. Lungs elsewhere clear. No pneumothorax. Cardiac silhouette within normal limits. Electronically Signed   By: Lowella Grip III M.D.   On: 05/07/2017 09:13     Medications:  Scheduled: . gabapentin  300 mg Oral TID  . heparin  5,000 Units Subcutaneous Q8H  . insulin aspart  0-9 Units Subcutaneous TID WC  . insulin glargine  18 Units Subcutaneous Daily  . levothyroxine  100 mcg Oral QAC breakfast  . loperamide  4 mg Oral Once  . magic mouthwash  10 mL Oral TID  . metoCLOPramide  5 mg Oral TID AC  . potassium chloride  40 mEq Oral Once  . saccharomyces boulardii  250 mg Oral BID  . sucralfate  1 g Oral TID WC & HS   Continuous: . sodium chloride Stopped (05/07/17 2102)   DXI:[id number], ondansetron, promethazine, promethazine  Assessment/Plan:  Active Problems:   Type 1 diabetes mellitus ([place])   Diabetic  gastroparesis ([place])   Diabetic ketoacidosis without coma associated with type 1 diabetes mellitus ([place])  Diabetic ketoacidosis in the setting of type 1 diabetes. It appears the patient has had problems with getting supplies for the pump. She is followed by an endocrinologist. It also appears that there are some financial difficulties. Patient again went into diabetic ketoacidosis yesterday afternoon. She had to be placed back on an insulin infusion. Anion gap closed again overnight. Will initiate Lantus. Discussed in detail with patient. Recommend not using her pump for now until she has discussed with her endocrinologist as outpatient. Will attempt to transition her to 70/30 insulin at discharge due to cost considerations. HbA1c 9.1.  Diabetic gastroparesis/loose stools Seems to have improved. Continue Reglan. She does mention loose stools which could be due to the Reglan. We will decrease the dose. Give her Imodium. Dark coloration of stool could be due to Carafate. Her dysphagia/odynophagia has improved significantly. Cut back on the dose of Carafate. Chest x-ray did not show any acute findings. Continue to monitor for now.   Acute bronchitis. Resolved  Hypokalemia and hypophosphatemia and hyponatremia Improved  Leukocytosis. Patient is afebrile. WBC has been improving. Elevated WBC could be due to hemoconcentration/dehydration. WBC is normal today. Not on any antibiotics currently.  Normocytic anemia. Drop in hemoglobin is due to dilution. No evidence for overt bleeding. Hemoglobin is stable.  Hypothyroidism TSH was low. Free T4 is 1.26. No signs or symptoms of hyperthyroidism. Continue current dose of Synthroid. Will recommend repeating thyroid function tests in a few weeks.  History of seizure disorder and bipolar disorder. She is on gabapentin, which is being continued.  DVT Prophylaxis: Subcutaneous heparin    Code Status: Full code  Family Communication: Discussed with the  patient  Disposition Plan: Management as outlined above. Anticipate transfer to the floor by this afternoon if she remains.    LOS: 3 days   [place] [facility] Pager [phone] 05/08/2017, 7:50 AM  If 7PM-7AM, please contact night-coverage at [email or web address], password [facility]

## 2017-05-09 LAB — BASIC METABOLIC PANEL
ANION GAP: 8 (ref 5–15)
CALCIUM: 8.5 mg/dL — AB (ref 8.9–10.3)
CO2: 24 mmol/L (ref 22–32)
CREATININE: 0.49 mg/dL (ref 0.44–1.00)
Chloride: 107 mmol/L (ref 101–111)
GFR calc Af Amer: >60 mL/min (ref >60)
GLUCOSE: 61 mg/dL — AB (ref 65–99)
Potassium: 3.4 mmol/L — ABNORMAL LOW (ref 3.5–5.1)
Sodium: 139 mmol/L (ref 135–145)

## 2017-05-09 LAB — CBC
HEMATOCRIT: 28.5 % — AB (ref 36.0–46.0)
Hemoglobin: 9.8 g/dL — ABNORMAL LOW (ref 12.0–15.0)
MCH: 27.4 pg (ref 26.0–34.0)
MCHC: 34.4 g/dL (ref 30.0–36.0)
MCV: 79.6 fL (ref 78.0–100.0)
PLATELETS: 198 10*3/uL (ref 150–400)
RBC: 3.58 MIL/uL — ABNORMAL LOW (ref 3.87–5.11)
RDW: 13.5 % (ref 11.5–15.5)
WBC: 6.8 10*3/uL (ref 4.0–10.5)

## 2017-05-09 LAB — GLUCOSE, CAPILLARY
GLUCOSE-CAPILLARY: 179 mg/dL — AB (ref 65–99)
GLUCOSE-CAPILLARY: 278 mg/dL — AB (ref 65–99)
GLUCOSE-CAPILLARY: 146 mg/dL — AB (ref 65–99)
GLUCOSE-CAPILLARY: 165 mg/dL — AB (ref 65–99)
Glucose-Capillary: 85 mg/dL (ref 65–99)
Glucose-Capillary: 80 mg/dL (ref 65–99)

## 2017-05-09 MED ORDER — PANTOPRAZOLE SODIUM 40 MG PO TBEC
40.0000 mg | DELAYED_RELEASE_TABLET | Freq: Every day | ORAL | Status: DC
  Administered 2017-05-09 – 2017-05-10 (×2): 40 mg via ORAL
  Filled 2017-05-09 (×2): qty 1

## 2017-05-09 MED ORDER — POTASSIUM CHLORIDE CRYS ER 20 MEQ PO TBCR
40.0000 meq | EXTENDED_RELEASE_TABLET | Freq: Once | ORAL | Status: AC
  Administered 2017-05-09: 40 meq via ORAL
  Filled 2017-05-09: qty 2

## 2017-05-09 NOTE — Progress Notes (Signed)
Patient ambulated in hallway with standby assist. While ambulating heart rate when up to 130.  Patient assisted back to chair heart rate returned to normal.

## 2017-05-09 NOTE — Progress Notes (Signed)
[facility] PROGRESS NOTE  Karen Hayden JXB:[id number] DOB: 1975/09/18 DOA: 05/05/2017  PCP: Jacelyn Pi, MD  Brief History/Interval Summary: 41 year old Caucasian female with type 1 diabetes on insulin pump, presented with fever, chills, cough, diarrhea and was found to have diabetic ketoacidosis. She was admitted to the intensive care unit. Subsequently transitioned to stepdown. Patient had a very slow recovery. She went back into DKA requiring reinitiation of insulin infusion. Seems to be stable over the past 24 hours.  Reason for Visit: Poorly controlled diabetes  Consultants: None  Procedures: None  Antibiotics: None  Subjective/Interval History: Patient states that she is feeling much better this morning. Denies any nausea, vomiting. Does not have any further difficulty swallowing. No chest pain. No shortness of breath. Diarrhea is improving.  ROS: Denies any headaches.  Objective:  Vital Signs  Vitals:   05/09/17 0004 05/09/17 0148 05/09/17 0400 05/09/17 0425  BP:  135/81 139/72   Pulse:  65 (!) 49   Resp:  11 11   Temp: 98.6 F (37 C)   98.4 F (36.9 C)  TempSrc: Oral   Oral  SpO2:  100% 100%   Weight:      Height:        Intake/Output Summary (Last 24 hours) at 05/09/17 0742 Last data filed at 05/09/17 0500  Gross per 24 hour  Intake          2976.72 ml  Output                0 ml  Net          2976.72 ml   Filed Weights   05/05/17 1247 05/05/17 2010  Weight: 53.5 kg (118 lb) 53.2 kg (117 lb 4.6 oz)    General appearance: Awake, alert. In no distress Resp: Clear to auscultation bilaterally Cardio: S1, S2 is normal, regular. No S3, S4. No rubs, murmurs, or bruit GI: Abdomen is soft. Nontender, nondistended. Bowel sounds present. No masses or organomegaly Extremities: No edema Neurologic: No focal neurological deficits  Lab Results:  Data Reviewed: I have personally reviewed following labs and imaging studies  CBC:  Recent Labs Lab  05/05/17 1345 05/06/17 0405 05/08/17 0351 05/09/17 0342  WBC 37.3* 20.3* 7.7 6.8  NEUTROABS 33.2*  --   --   --   HGB 13.0 10.6* 10.1* 9.8*  HCT 38.4 31.1* 29.2* 28.5*  MCV 81.9 79.3 80.2 79.6  PLT 472* 302 214 130    Basic Metabolic Panel:  Recent Labs Lab 05/05/17 1345  05/06/17 0405  05/07/17 0333  05/07/17 2054 05/08/17 0127 05/08/17 0351 05/08/17 1234 05/09/17 0342  NA 124*  < > 137  < > 133*  < > 136 139 138 133* 139  K 5.7*  < > 3.6  < > 3.1*  < > 3.2* 4.0 3.8 3.3* 3.4*  CL 93*  < > 113*  < > 107  < > 108 112* 110 104 107  CO2 <7*  < > 15*  < > 15*  < > 20* 18* 19* 18* 24  GLUCOSE 587*  < > 111*  < > 299*  < > 178* 79 108* 242* 61*  BUN 25*  < > 9  < > 6  < > 6 <5* <5* 6 <5*  CREATININE 1.38*  < > 0.63  < > 0.63  < > 0.48 0.45 0.55 0.58 0.49  CALCIUM 9.3  < > 8.2*  < > 8.3*  < > 8.0* 8.4* 8.5*  8.6* 8.5*  MG 1.8  --  1.3*  --  1.7  --   --   --  1.7  --   --   PHOS  --   --  1.8*  --  1.5*  --   --   --  2.6  --   --   < > = values in this interval not displayed.  GFR: Estimated Creatinine Clearance: 76.9 mL/min (by C-G formula based on SCr of 0.49 mg/dL).  Cardiac Enzymes:  Recent Labs Lab 05/05/17 1345  TROPONINI <0.03    CBG:  Recent Labs Lab 05/08/17 1153 05/08/17 1518 05/08/17 1629 05/08/17 2115 05/08/17 2340  GLUCAP 234* 176* 179* 90 85    Thyroid Function Tests:  Recent Labs  05/08/17 0351  FREET4 1.26*      Recent Results (from the past 240 hour(s))  Culture, blood (Routine X 2) w Reflex to ID Panel     Status: None (Preliminary result)   Collection Time: 05/05/17  5:09 PM  Result Value Ref Range Status   Specimen Description BLOOD LEFT ANTECUBITAL  Final   Special Requests   Final    BOTTLES DRAWN AEROBIC AND ANAEROBIC Blood Culture adequate volume   Culture   Final    NO GROWTH 3 DAYS Performed at [facility], [place] [address] [place], [place] [address]    Report Status PENDING  Incomplete  Culture, blood (Routine  X 2) w Reflex to ID Panel     Status: None (Preliminary result)   Collection Time: 05/05/17  5:14 PM  Result Value Ref Range Status   Specimen Description BLOOD RIGHT WRIST  Final   Special Requests IN PEDIATRIC BOTTLE Blood Culture adequate volume  Final   Culture   Final    NO GROWTH 3 DAYS Performed at [facility], [place] [address], [place], [place] [address]    Report Status PENDING  Incomplete  MRSA PCR Screening     Status: None   Collection Time: 05/05/17 11:00 PM  Result Value Ref Range Status   MRSA by PCR NEGATIVE NEGATIVE Final    Comment:        The GeneXpert MRSA Assay (FDA approved for NASAL specimens only), is one component of a comprehensive MRSA colonization surveillance program. It is not intended to diagnose MRSA infection nor to guide or monitor treatment for MRSA infections.       Radiology Studies: Dg Chest 2 View  Result Date: 05/07/2017 CLINICAL DATA:  Chest pain EXAM: CHEST  2 VIEW COMPARISON:  May 05, 2017 FINDINGS: There is left base atelectatic change. There is a questionable small left pleural effusion. There is no edema or consolidation. The heart size and pulmonary vascularity are normal. No adenopathy. No pneumothorax. No bone lesions. IMPRESSION: Left base atelectasis with questionable small left pleural effusion. No frank edema or consolidation. Lungs elsewhere clear. No pneumothorax. Cardiac silhouette within normal limits. Electronically Signed   By: Lowella Grip III M.D.   On: 05/07/2017 09:13     Medications:  Scheduled: . gabapentin  300 mg Oral TID  . heparin  5,000 Units Subcutaneous Q8H  . insulin aspart  0-9 Units Subcutaneous TID WC  . insulin glargine  18 Units Subcutaneous Daily  . levothyroxine  100 mcg Oral QAC breakfast  . magic mouthwash  10 mL Oral TID  . metoCLOPramide  5 mg Oral TID AC  . saccharomyces boulardii  250 mg Oral BID  . sucralfate  1 g Oral TID WC & HS   Continuous: . sodium chloride 75  mL/hr at 05/09/17 0500   ULA:GTXMIWOEHO, ondansetron, promethazine, promethazine  Assessment/Plan:  Active Problems:   Type 1 diabetes mellitus ([place])   Diabetic gastroparesis ([place])   Diabetic ketoacidosis without coma associated with type 1 diabetes mellitus ([place])    Diabetic ketoacidosis in the setting of type 1 diabetes. It appears the patient has had problems with getting supplies for the pump. She is followed by an endocrinologist. It also appears that there are some financial difficulties. Patient again went into diabetic ketoacidosis. This was treated with insulin infusion. She was transitioned to Lantus yesterday morning. She had some more episodes of nausea yesterday but feels better this morning. Anion gap remains closed. Continue current dose of Lantus and continue sliding scale insulin coverage. HbA1c is 9.1. If she remains stable, then we will transition her to 70/30 insulin at discharge due to cost considerations. She should not use her insulin pump for now until she has had further discussions with her endocrinologist.  Diabetic gastroparesis/loose stools Seems to be improved. Continue current dose of Reglan. Continue Imodium as needed. Dark coloration of stool could be due to Carafate. Her dysphagia has improved significantly.   Acute bronchitis. Resolved  Hypokalemia and hypophosphatemia and hyponatremia Replace potassium.  Leukocytosis. This is resolved. Elevated WBC could have been due to hemoconcentration/dehydration. Not on any antibiotics currently.  Normocytic anemia. Drop in hemoglobin is due to dilution. No evidence for overt bleeding. Hemoglobin is stable.  Hypothyroidism TSH was low. Free T4 is 1.26. No signs or symptoms of hyperthyroidism. Continue current dose of Synthroid. Will recommend repeating thyroid function tests in a few weeks.  History of seizure disorder and bipolar disorder. She is on gabapentin, which is being continued.  DVT Prophylaxis:  Subcutaneous heparin    Code Status: Full code  Family Communication: Discussed with the patient  Disposition Plan: Continue to mobilize. If she remains stable after lunch, she could be transferred to the floor.    LOS: 4 days   [facility] Pager [phone] 05/09/2017, 7:42 AM  If 7PM-7AM, please contact night-coverage at [email or web address], password [facility]

## 2017-05-10 LAB — BASIC METABOLIC PANEL
Anion gap: 8 (ref 5–15)
CALCIUM: 8.7 mg/dL — AB (ref 8.9–10.3)
CO2: 25 mmol/L (ref 22–32)
Chloride: 105 mmol/L (ref 101–111)
Creatinine, Ser: 0.49 mg/dL (ref 0.44–1.00)
GFR calc Af Amer: >60 mL/min (ref >60)
GLUCOSE: 65 mg/dL (ref 65–99)
Potassium: 3.8 mmol/L (ref 3.5–5.1)
Sodium: 138 mmol/L (ref 135–145)

## 2017-05-10 LAB — CULTURE, BLOOD (ROUTINE X 2)
Culture: NO GROWTH
Culture: NO GROWTH
SPECIAL REQUESTS: ADEQUATE
SPECIAL REQUESTS: ADEQUATE

## 2017-05-10 LAB — GLUCOSE, CAPILLARY
GLUCOSE-CAPILLARY: 82 mg/dL (ref 65–99)
Glucose-Capillary: 58 mg/dL — ABNORMAL LOW (ref 65–99)
Glucose-Capillary: 220 mg/dL — ABNORMAL HIGH (ref 65–99)
Glucose-Capillary: 76 mg/dL (ref 65–99)

## 2017-05-10 LAB — MAGNESIUM: Magnesium: 1.5 mg/dL — ABNORMAL LOW (ref 1.7–2.4)

## 2017-05-10 MED ORDER — LORATADINE 10 MG PO TABS
10.0000 mg | ORAL_TABLET | Freq: Every day | ORAL | Status: DC
  Administered 2017-05-10: 10 mg via ORAL
  Filled 2017-05-10: qty 1

## 2017-05-10 MED ORDER — INSULIN ASPART PROT & ASPART (70-30 MIX) 100 UNIT/ML ~~LOC~~ SUSP: SUBCUTANEOUS | 1 refills | Status: DC

## 2017-05-10 MED ORDER — MAGNESIUM SULFATE 2 GM/50ML IV SOLN
2.0000 g | Freq: Once | INTRAVENOUS | Status: AC
  Administered 2017-05-10: 2 g via INTRAVENOUS
  Filled 2017-05-10: qty 50

## 2017-05-10 MED ORDER — SUCRALFATE 1 GM/10ML PO SUSP: 1.0000 g | Freq: Three times a day (TID) | ORAL | 0 refills | Status: DC

## 2017-05-10 MED ORDER — PANTOPRAZOLE SODIUM 40 MG PO TBEC: 40.0000 mg | DELAYED_RELEASE_TABLET | Freq: Every day | ORAL | 0 refills | Status: DC

## 2017-05-10 MED ORDER — LORATADINE 10 MG PO TABS: 10.0000 mg | ORAL_TABLET | Freq: Every day | ORAL | 0 refills | Status: DC

## 2017-05-10 MED ORDER — INSULIN ASPART PROT & ASPART (70-30 MIX) 100 UNIT/ML ~~LOC~~ SUSP
14.0000 [IU] | Freq: Two times a day (BID) | SUBCUTANEOUS | Status: DC
  Administered 2017-05-10: 14 [IU] via SUBCUTANEOUS
  Filled 2017-05-10: qty 10

## 2017-05-10 MED ORDER — "INSULIN SYRINGE 28G X 1/2"" 0.5 ML MISC": 1 refills | Status: DC

## 2017-05-10 NOTE — Discharge Summary (Signed)
[facility]  Physician Discharge Summary   Patient ID: Karen Hayden MRN: [id number] DOB/AGE: March 20, 1976 41 y.o.  Admit date: 05/05/2017 Discharge date: 05/10/2017  PCP: Jacelyn Pi, MD  DISCHARGE DIAGNOSES:  Active Problems:   Type 1 diabetes mellitus ([place])   Diabetic gastroparesis ([place])   Diabetic ketoacidosis without coma associated with type 1 diabetes mellitus ([place])   RECOMMENDATIONS FOR OUTPATIENT FOLLOW UP: 1. Patient asked to discontinue her insulin pump until she has had a chance to discuss with her endocrinologist. 2. Will need repeat thyroid function tests in 4-6 weeks. Please see below.   DISCHARGE CONDITION: fair  Diet recommendation: Modified carbohydrate  Filed Weights   05/05/17 1247 05/05/17 2010 05/09/17 1448  Weight: 53.5 kg (118 lb) 53.2 kg (117 lb 4.6 oz) 54 kg (119 lb)    INITIAL HISTORY: 41 year old Caucasian female with type 1 diabetes on insulin pump, presented with fever, chills, cough, diarrhea and was found to have diabetic ketoacidosis. She was admitted to the intensive care unit. Subsequently transitioned to stepdown. Patient had a very slow recovery. She went back into DKA requiring reinitiation of insulin infusion. Seems to be stable over the past 24 hours.   HOSPITAL COURSE:   Diabetic ketoacidosis in the setting of type 1 diabetes. It appears the patient has had problems with getting supplies for the pump. She is followed by an endocrinologist. It also appears that there are some financial difficulties. Patient was treated as per the DKA protocol and was initially transitioned over to her pump. However, she again went into diabetic ketoacidosis requiring insulin infusion. Subsequently transitioned over to Lantus. Diabetes coordinator was assisting with care. It is felt that patient should discontinue use of pump for now since there clearly appears to be an problem with it. She will discuss this further with her endocrinologist as  outpatient. She will be discharged on 70/30 insulin. HbA1c was detected to be 9.1. She appears to have brittle diabetes and blood sugars have been fluctuating. However, symptomatically she feels much better this morning and wishes to go home. She has tolerated lunch. Okay for discharge. She's already made an appointment with her endocrinologist for next week.  Diabetic gastroparesis/loose stools Improved. Continue Reglan. She is tolerating her diet. Dysphagia has improved. Was likely due to multiple episodes of nausea and vomiting and gastritis. Continue PPI. Carafate for a few more days.   Acute bronchitis. Resolved  Hypokalemia and hypophosphatemia and hyponatremia We replaced potassium. Normal today. Magnesium was replaced.  Leukocytosis. This is resolved. Elevated WBC could have been due to hemoconcentration/dehydration.   Normocytic anemia. Drop in hemoglobin is due to dilution. No evidence for overt bleeding. Hemoglobin is stable.  Hypothyroidism TSH was low. Free T4 is 1.26. No signs or symptoms of hyperthyroidism. Continue current dose of Synthroid. Will recommend repeating thyroid function tests in a few weeks.  History of seizure disorder and bipolar disorder. She is on gabapentin, which is being continued.  Overall, stable. Very keen on going home. Tolerating her diet and ambulating. Okay for discharge.   PERTINENT LABS:  The results of significant diagnostics from this hospitalization (including imaging, microbiology, ancillary and laboratory) are listed below for reference.    Microbiology: Recent Results (from the past 240 hour(s))  Culture, blood (Routine X 2) w Reflex to ID Panel     Status: None (Preliminary result)   Collection Time: 05/05/17  5:09 PM  Result Value Ref Range Status   Specimen Description BLOOD LEFT ANTECUBITAL  Final   Special  Requests   Final    BOTTLES DRAWN AEROBIC AND ANAEROBIC Blood Culture adequate volume   Culture   Final    NO  GROWTH 4 DAYS Performed at [facility], [place] [address], [place], [place] [address]    Report Status PENDING  Incomplete  Culture, blood (Routine X 2) w Reflex to ID Panel     Status: None (Preliminary result)   Collection Time: 05/05/17  5:14 PM  Result Value Ref Range Status   Specimen Description BLOOD RIGHT WRIST  Final   Special Requests IN PEDIATRIC BOTTLE Blood Culture adequate volume  Final   Culture   Final    NO GROWTH 4 DAYS Performed at [facility], [place] [address] [place], [place] [address]    Report Status PENDING  Incomplete  MRSA PCR Screening     Status: None   Collection Time: 05/05/17 11:00 PM  Result Value Ref Range Status   MRSA by PCR NEGATIVE NEGATIVE Final    Comment:        The GeneXpert MRSA Assay (FDA approved for NASAL specimens only), is one component of a comprehensive MRSA colonization surveillance program. It is not intended to diagnose MRSA infection nor to guide or monitor treatment for MRSA infections.      Labs: Basic Metabolic Panel:  Recent Labs Lab 05/05/17 1345  05/06/17 0405  05/07/17 8295  05/08/17 0127 05/08/17 0351 05/08/17 1234 05/09/17 0342 05/10/17 0409  NA 124*  < > 137  < > 133*  < > 139 138 133* 139 138  K 5.7*  < > 3.6  < > 3.1*  < > 4.0 3.8 3.3* 3.4* 3.8  CL 93*  < > 113*  < > 107  < > 112* 110 104 107 105  CO2 <7*  < > 15*  < > 15*  < > 18* 19* 18* 24 25  GLUCOSE 587*  < > 111*  < > 299*  < > 79 108* 242* 61* 65  BUN 25*  < > 9  < > 6  < > <5* <5* 6 <5* <5*  CREATININE 1.38*  < > 0.63  < > 0.63  < > 0.45 0.55 0.58 0.49 0.49  CALCIUM 9.3  < > 8.2*  < > 8.3*  < > 8.4* 8.5* 8.6* 8.5* 8.7*  MG 1.8  --  1.3*  --  1.7  --   --  1.7  --   --  1.5*  PHOS  --   --  1.8*  --  1.5*  --   --  2.6  --   --   --   < > = values in this interval not displayed.  CBC:  Recent Labs Lab 05/05/17 1345 05/06/17 0405 05/08/17 0351 05/09/17 0342  WBC 37.3* 20.3* 7.7 6.8  NEUTROABS 33.2*  --   --   --   HGB  13.0 10.6* 10.1* 9.8*  HCT 38.4 31.1* 29.2* 28.5*  MCV 81.9 79.3 80.2 79.6  PLT 472* 302 214 198   Cardiac Enzymes:  Recent Labs Lab 05/05/17 1345  TROPONINI <0.03    CBG:  Recent Labs Lab 05/09/17 2127 05/10/17 0420 05/10/17 0438 05/10/17 0745 05/10/17 1144  GLUCAP 80 58* 82 220* 76     IMAGING STUDIES Dg Chest 2 View  Result Date: 05/07/2017 CLINICAL DATA:  Chest pain EXAM: CHEST  2 VIEW COMPARISON:  May 05, 2017 FINDINGS: There is left base atelectatic change. There is  a questionable small left pleural effusion. There is no edema or consolidation. The heart size and pulmonary vascularity are normal. No adenopathy. No pneumothorax. No bone lesions. IMPRESSION: Left base atelectasis with questionable small left pleural effusion. No frank edema or consolidation. Lungs elsewhere clear. No pneumothorax. Cardiac silhouette within normal limits. Electronically Signed   By: Lowella Grip III M.D.   On: 05/07/2017 09:13   Dg Chest 2 View  Result Date: 04/18/2017 CLINICAL DATA:  Patient states that she was recently treated for a HA - is a type one diabetic and has "passed out" several times since the HA treatment of low Blood sugar. The patient also reports that she has had HTN over the last 2 -3 days. The patient comes to triage, hyperventilating, anxious with complaints of "i dont know if I want ot call it chest pain or tightness" - palpitations and SOB while at work earlier today. Numbness to her face and hands. EXAM: CHEST  2 VIEW COMPARISON:  10/06/2016 FINDINGS: Cardiac silhouette is normal size. No mediastinal or hilar masses. No evidence of adenopathy. Clear lungs.  No pleural effusion or pneumothorax. Skeletal structures are unremarkable. IMPRESSION: No active cardiopulmonary disease. Electronically Signed   By: Lajean Manes M.D.   On: 04/18/2017 13:22   Portable Chest X-ray (1 View)  Result Date: 05/05/2017 CLINICAL DATA:  Dyspnea. EXAM: PORTABLE CHEST 1 VIEW  COMPARISON:  Radiographs of April 18, 2017. FINDINGS: The heart size and mediastinal contours are within normal limits. Both lungs are clear. No pneumothorax or pleural effusion is noted. The visualized skeletal structures are unremarkable. IMPRESSION: No acute cardiopulmonary abnormality seen. Electronically Signed   By: Marijo Conception, M.D.   On: 05/05/2017 17:26    DISCHARGE EXAMINATION: Vitals:   05/09/17 1404 05/09/17 1448 05/09/17 2128 05/10/17 0513  BP: 136/76 (!) 123/58 134/89 110/67  Pulse: 72 62 71 69  Resp: 18 18 16 17   Temp:  98.2 F (36.8 C) 98.3 F (36.8 C) 97.9 F (36.6 C)  TempSrc:  Oral Oral Oral  SpO2: 100% 100% 100% 100%  Weight:  54 kg (119 lb)    Height:  5\' 1"  (1.549 m)     General appearance: alert, cooperative, appears stated age and no distress Resp: clear to auscultation bilaterally Cardio: regular rate and rhythm, S1, S2 normal, no murmur, click, rub or gallop GI: soft, non-tender; bowel sounds normal; no masses,  no organomegaly  DISPOSITION: Home  Discharge Instructions    Call MD for:  extreme fatigue    Complete by:  As directed    Call MD for:  persistant dizziness or light-headedness    Complete by:  As directed    Call MD for:  persistant nausea and vomiting    Complete by:  As directed    Call MD for:  severe uncontrolled pain    Complete by:  As directed    Call MD for:  temperature >100.4    Complete by:  As directed    Diet Carb Modified    Complete by:  As directed    Discharge instructions    Complete by:  As directed    Please be sure to follow-up with her endocrinologist within 1 week. Take your insulin as prescribed. Monitor your blood glucose levels at home as before. Keep a log for your technologist.  You were cared for by a hospitalist during your hospital stay. If you have any questions about your discharge medications or the care you received while  you were in the hospital after you are discharged, you can call the unit and  asked to speak with the hospitalist on call if the hospitalist that took care of you is not available. Once you are discharged, your primary care physician will handle any further medical issues. Please note that NO REFILLS for any discharge medications will be authorized once you are discharged, as it is imperative that you return to your primary care physician (or establish a relationship with a primary care physician if you do not have one) for your aftercare needs so that they can reassess your need for medications and monitor your lab values. If you do not have a primary care physician, you can call [phone] for a physician referral.   Increase activity slowly    Complete by:  As directed       ALLERGIES:  Allergies  Allergen Reactions  . Lipitor [Atorvastatin]     Attacks joints, inflamation  . Codeine Rash      Discharge Medication List as of 05/10/2017  1:03 PM    START taking these medications   Details  insulin aspart protamine- aspart (NOVOLOG MIX 70/30) (70-30) 100 UNIT/ML injection Take 12 units in AM (withbreakfast) daily and 10 units at night (with supper) daily., Print    INSULIN SYRINGE .5CC/28G (INS SYRINGE/NEEDLE .5CC/28G) 28G X 1/2" 0.5 ML MISC Use as instructed to administer insulin, Print    loratadine (CLARITIN) 10 MG tablet Take 1 tablet (10 mg total) by mouth daily., Starting Mon 05/10/2017, Until Thu 05/20/2017, Print    pantoprazole (PROTONIX) 40 MG tablet Take 1 tablet (40 mg total) by mouth daily at 12 noon., Starting Mon 05/10/2017, Print    sucralfate (CARAFATE) 1 GM/10ML suspension Take 10 mLs (1 g total) by mouth 4 (four) times daily -  with meals and at bedtime., Starting Mon 05/10/2017, Until Sat 05/15/2017, Print      CONTINUE these medications which have NOT CHANGED   Details  gabapentin (NEURONTIN) 300 MG capsule Take 300 mg by mouth 3 (three) times daily., Historical Med    levothyroxine (SYNTHROID, LEVOTHROID) 100 MCG tablet Take 100 mcg by mouth  daily., Historical Med    metoCLOPramide (REGLAN) 10 MG tablet Take 1 tablet every morning and 1 tablet at bedtime, Normal    Multiple Vitamin (MULTIVITAMIN WITH MINERALS) TABS tablet Take 1 tablet by mouth daily., Historical Med    ondansetron (ZOFRAN ODT) 4 MG disintegrating tablet 4mg  ODT q4 hours prn nausea/vomit, Print    promethazine (PHENERGAN) 25 MG tablet Take 1 tablet (25 mg total) by mouth every 6 (six) hours as needed for nausea or vomiting., Starting Thu 03/04/2017, Normal    trimethoprim (TRIMPEX) 100 MG tablet Take 100 mg by mouth daily. Long term med, Historical Med      STOP taking these medications     HYDROcodone-acetaminophen (NORCO) 5-325 MG tablet      Insulin Human (INSULIN PUMP) SOLN          Follow-up Information    Balan, Bindubal, MD. Schedule an appointment as soon as possible for a visit in 1 week(s).   Specialty:  Endocrinology Contact information: [address] [place] [address] [phone]           TOTAL DISCHARGE TIME: 61 minutes  [facility] Pager [phone]  05/10/2017, 2:24 PM

## 2017-05-10 NOTE — Progress Notes (Signed)
Nutrition Follow-up  DOCUMENTATION CODES:   Non-severe (moderate) malnutrition in context of acute illness/injury  INTERVENTION:   -Provided "Gastroparesis Nutrition Therapy" handout from [employer] -Encouraged PO intake -RD will continue to monitor for needs  NUTRITION DIAGNOSIS:   Inadequate oral intake related to acute illness, nausea, vomiting as evidenced by per patient/family report. -N/A  NEW NUTRITION DIAGNOSIS:   Malnutrition (moderate) related to acute illness, nausea, vomiting (gastroparesis) as evidenced by mild depletion of body fat, moderate depletions of muscle mass, energy intake < 75% for > 7 days.  GOAL:   Patient will meet greater than or equal to 90% of their needs  Progressing.  MONITOR:   PO intake, Diet advancement, Weight trends, Labs  ASSESSMENT:   41 y.o. female with PMH including but not limited to DM on an insulin pump.  She presented to [facility] ED 05/05/17 with weakness, confusion, hyperglycemia.  She states she had not been feeling herself for a couple of days and had experienced subjective fevers, chills, productive cough, nausea, diarrhea.  She has apparently been exposed to what sounds like a viral GI illness through her work (co-workers with vomiting and diarrhea for a few days).  Has used insulin pump as usual, no known malfunctions.  Patient reports feeling much better today. States she ate a boiled egg, bacon, toast and apple juice this morning for breakfast. Tolerated well so far. Pt is interested in gastroparesis diet information. RD provided handout for patient and reviewed briefly.   Nutrition-Focused physical exam completed. Findings are mild fat depletion, mild-moderate muscle depletion, and no edema. Given that patient has had poor PO intake since 2 days PTA on 9/5 d/t N/V ( 7 days ).   Medications: Magic Mouthwash TID, Reglan tablet TID, Protonix tablet daily, Florastor capsule BID, Carafate suspension QID Labs  reviewed: CBGs: 82-220 Low Mg  Diet Order:  Diet Carb Modified Fluid consistency: Thin; Room service appropriate? Yes  Skin:  Reviewed, no issues  Last BM:  9/9  Height:   Ht Readings from Last 1 Encounters:  05/09/17 5\' 1"  (1.549 m)    Weight:   Wt Readings from Last 1 Encounters:  05/09/17 119 lb (54 kg)    Ideal Body Weight:  56.82 kg  BMI:  Body mass index is 22.48 kg/m.  Estimated Nutritional Needs:   Kcal:  1330-1600 (25-30 kcal/kg)  Protein:  53-64 grams (1-1.2 grams/kg)  Fluid:  >/= 1.8 L/day  EDUCATION NEEDS:   Education needs addressed  Clayton Bibles, MS, RD, LDN Pager: [phone] After Hours Pager: [phone]

## 2017-05-10 NOTE — Progress Notes (Signed)
Inpatient Diabetes Program Recommendations  AACE/ADA: New Consensus Statement on Inpatient Glycemic Control (2015)  Target Ranges:  Prepandial:   less than 140 mg/dL      Peak postprandial:   less than 180 mg/dL (1-2 hours)      Critically ill patients:  140 - 180 mg/dL   Lab Results  Component Value Date   GLUCAP 220 (H) 05/10/2017   HGBA1C 9.1 (H) 05/08/2017    Review of Glycemic Control  Diabetes history: DM1 Outpatient Diabetes medications: Insulin pump (Pump settings: 16.6 units - basal; CHO ratio - 1:10; Sensitivity - 50 (1 units drops blood sugar 50 mg/dL); Goal - 140 mg/dL Current orders for Inpatient glycemic control: Novolog 70/30 14 units bid, Novolog 0-9 units TID with meals  Inpatient Diabetes Program Recommendations: Noted changed from Lantus insulin to 70/30 bid. Please consider if remains on 70/30: -Decrease 70/30 insulin to 12 units bid -Decrease Novolog correction scale to custom scale Custom Novolog correction scale 0-5 units       151-200  1 unit      201-250  2 units      251-300  3 units      301-350  4 units      351-400  5 units  Thank you, Bethena Roys E. Jabori Henegar, RN, MSN, CDE  Diabetes Coordinator Inpatient Glycemic Control Team Team Pager [phone] (8am-5pm) 05/10/2017 11:31 AM

## 2017-05-10 NOTE — Discharge Instructions (Signed)
Diabetic Ketoacidosis °Diabetic ketoacidosis is a life-threatening complication of diabetes. If it is not treated, it can cause severe dehydration and organ damage and can lead to a coma or death. °What are the causes? °This condition develops when there is not enough of the hormone insulin in the body. Insulin helps the body to break down sugar for energy. Without insulin, the body cannot break down sugar, so it breaks down fats instead. This leads to the production of acids that are called ketones. Ketones are poisonous at high levels. °This condition can be triggered by: °· Stress on the body that is brought on by an illness. °· Medicines that raise blood glucose levels. °· Not taking diabetes medicine. ° °What are the signs or symptoms? °Symptoms of this condition include: °· Fatigue. °· Weight loss. °· Excessive thirst. °· Light-headedness. °· Fruity or sweet-smelling breath. °· Excessive urination. °· Vision changes. °· Confusion or irritability. °· Nausea. °· Vomiting. °· Rapid breathing. °· Abdominal pain. °· Feeling flushed. ° °How is this diagnosed? °This condition is diagnosed based on a medical history, a physical exam, and blood tests. You may also have a urine test that checks for ketones. °How is this treated? °This condition may be treated with: °· Fluid replacement. This may be done to correct dehydration. °· Insulin injections. These may be given through the skin or through an IV tube. °· Electrolyte replacement. Electrolytes, such as potassium and sodium, may be given in pill form or through an IV tube. °· Antibiotic medicines. These may be prescribed if your condition was caused by an infection. ° °Follow these instructions at home: °Eating and drinking °· Drink enough fluids to keep your urine clear or pale yellow. °· If you cannot eat, alternate between drinking fluids with sugar (such as juice) and salty fluids (such as broth or bouillon). °· If you can eat, follow your usual diet and drink  sugar-free liquids, such as water. °Other Instructions ° °· Take insulin as directed by your health care provider. Do not skip insulin injections. Do not use expired insulin. °· If your blood sugar is over 240 mg/dL, monitor your urine ketones every 4-6 hours. °· If you were prescribed an antibiotic medicine, finish all of it even if you start to feel better. °· Rest and exercise only as directed by your health care provider. °· If you get sick, call your health care provider and begin treatment quickly. Your body often needs extra insulin to fight an illness. °· Check your blood glucose levels regularly. If your blood glucose is high, drink plenty of fluids. This helps to flush out ketones. °Contact a health care provider if: °· Your blood glucose level is too high or too low. °· You have ketones in your urine. °· You have a fever. °· You cannot eat. °· You cannot tolerate fluids. °· You have been vomiting for more than 2 hours. °· You continue to have symptoms of this condition. °· You develop new symptoms. °Get help right away if: °· Your blood glucose levels continue to be high (elevated). °· Your monitor reads “high” even when you are taking insulin. °· You faint. °· You have chest pain. °· You have trouble breathing. °· You have a sudden, severe headache. °· You have sudden weakness in one arm or one leg. °· You have sudden trouble speaking or swallowing. °· You have vomiting or diarrhea that gets worse after 3 hours. °· You feel severely fatigued. °· You have trouble thinking. °· You   have abdominal pain. °· You are severely dehydrated. Symptoms of severe dehydration include: °? Extreme thirst. °? Dry mouth. °? Blue lips. °? Cold hands and feet. °? Rapid breathing. °This information is not intended to replace advice given to you by your health care provider. Make sure you discuss any questions you have with your health care provider. °Document Released: 08/14/2000 Document Revised: 01/23/2016 Document  Reviewed: 07/25/2014 °[employer] Interactive Patient Education © 2017 [employer] ° °

## 2017-05-17 DIAGNOSIS — E7800 Pure hypercholesterolemia, unspecified: Secondary | ICD-10-CM | POA: Diagnosis not present

## 2017-05-17 DIAGNOSIS — Z23 Encounter for immunization: Secondary | ICD-10-CM | POA: Diagnosis not present

## 2017-05-17 DIAGNOSIS — E039 Hypothyroidism, unspecified: Secondary | ICD-10-CM | POA: Diagnosis not present

## 2017-05-17 DIAGNOSIS — E109 Type 1 diabetes mellitus without complications: Secondary | ICD-10-CM | POA: Diagnosis not present

## 2017-05-17 DIAGNOSIS — G609 Hereditary and idiopathic neuropathy, unspecified: Secondary | ICD-10-CM | POA: Diagnosis not present

## 2017-05-23 NOTE — ED Provider Notes (Signed)
[facility] DEPT Provider Note   CSN: [id number] Arrival date & time: 05/05/17  1239     History   Chief Complaint Chief Complaint  Patient presents with  . Abdominal Pain  . Hyperglycemia  . Blood In Stools    HPI Karen Hayden is a 41 y.o. female.  HPI   41 year old female with abdominal pain and nausea/vomiting/diarrhea. Symptom onset yesterday. She is an insulin-dependent diabetic. She reports her blood sugars "high" since last night. She does have an insulin pump and she reports that she feels like it's been working appropriately. She reports compliance with her medications. She denies any fevers or chills.  Past Medical History:  Diagnosis Date  . Bipolar 1 disorder ([place])   . Chronic kidney infection   . 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 DM (diabetes mellitus) ([place])   . Gastroparesis    Due to diabetes   . Hyperlipidemia   . Hypertension   . Seizure disorder ([place])   . Thyroid disease   . Umbilical hernia     Patient Active Problem List   Diagnosis Date Noted  . Diabetic ketoacidosis without coma associated with type 1 diabetes mellitus ([place])   . Protein-calorie malnutrition, severe ([place]) 01/29/2014  . Nausea & vomiting 01/26/2014  . Unspecified constipation 01/26/2014  . Cough 01/26/2014  . Tobacco use disorder 01/26/2014  . Left sided abdominal pain 06/21/2013  . Gastroparesis 06/21/2013  . Nausea with vomiting 06/21/2013  . Other general symptoms(780.99)  06/21/2013  . Change in bowel habits 06/21/2013  . Diabetic gastroparesis ([place]) 06/04/2011  . Type 1 diabetes mellitus ([place]) 01/04/2009  . Acute bronchitis 01/04/2009  . SEIZURE DISORDER 01/04/2009    Past Surgical History:  Procedure Laterality Date  . TONSILLECTOMY    . VAGINAL HYSTERECTOMY      OB History    Gravida Para Term Preterm AB Living   2         2   SAB TAB Ectopic Multiple Live Births   0 0             Home Medications    Prior to Admission medications   Medication Sig  Start Date End Date Taking? Authorizing Provider  gabapentin (NEURONTIN) 300 MG capsule Take 300 mg by mouth 3 (three) times daily.   Yes [provider]  levothyroxine (SYNTHROID, LEVOTHROID) 100 MCG tablet Take 100 mcg by mouth daily.   Yes [provider]  metoCLOPramide (REGLAN) 10 MG tablet Take 1 tablet every morning and 1 tablet at bedtime 04/08/17  Yes Pyrtle, Lajuan Lines, MD  Multiple Vitamin (MULTIVITAMIN WITH MINERALS) TABS tablet Take 1 tablet by mouth daily.   Yes [provider]  ondansetron (ZOFRAN ODT) 4 MG disintegrating tablet 4mg  ODT q4 hours prn nausea/vomit Patient taking differently: Take 4 mg by mouth every 4 (four) hours as needed for nausea.  10/06/16  Yes Etta Quill, NP  promethazine (PHENERGAN) 25 MG tablet Take 1 tablet (25 mg total) by mouth every 6 (six) hours as needed for nausea or vomiting. 03/04/17  Yes Pyrtle, Lajuan Lines, MD  trimethoprim (TRIMPEX) 100 MG tablet Take 100 mg by mouth daily. Long term med   Yes [provider]  insulin aspart protamine- aspart (NOVOLOG MIX 70/30) (70-30) 100 UNIT/ML injection Take 12 units in AM (withbreakfast) daily and 10 units at night (with supper) daily. 05/10/17   Bonnielee Haff, MD  INSULIN SYRINGE .5CC/28G (INS SYRINGE/NEEDLE .5CC/28G) 28G X 1/2" 0.5 ML MISC Use as instructed  to administer insulin 05/10/17   Bonnielee Haff, MD  loratadine (CLARITIN) 10 MG tablet Take 1 tablet (10 mg total) by mouth daily. 05/10/17 05/20/17  Bonnielee Haff, MD  pantoprazole (PROTONIX) 40 MG tablet Take 1 tablet (40 mg total) by mouth daily at 12 noon. 05/10/17   Bonnielee Haff, MD  sucralfate (CARAFATE) 1 GM/10ML suspension Take 10 mLs (1 g total) by mouth 4 (four) times daily -  with meals and at bedtime. 05/10/17 05/15/17  Bonnielee Haff, MD    Family History Family History  Problem Relation Age of Onset  . Diabetes Mother   . Heart attack Mother   . Ovarian cancer Paternal Grandmother        great grandmother     Social History Social History  Substance Use Topics  . Smoking status: Current Every Day Smoker    Packs/day: 2.00    Types: Cigarettes  . Smokeless tobacco: Never Used  . Alcohol use No     Allergies   Lipitor [atorvastatin] and Codeine   Review of Systems Review of Systems  All systems reviewed and negative, other than as noted in HPI.   Physical Exam Updated Vital Signs BP 110/67 (BP Location: Left Arm)   Pulse 69   Temp 97.9 F (36.6 C) (Oral)   Resp 17   Ht 5\' 1"  (1.549 m)   Wt 54 kg (119 lb)   SpO2 100%   BMI 22.48 kg/m   Physical Exam  Constitutional: She appears well-developed and well-nourished.  Laying in bed. Appears uncomfortable.  HENT:  Head: Normocephalic and atraumatic.  Dry mucous membranes  Eyes: Conjunctivae are normal. Right eye exhibits no discharge. Left eye exhibits no discharge.  Neck: Neck supple.  Cardiovascular: Normal rate, regular rhythm and normal heart sounds.  Exam reveals no gallop and no friction rub.   No murmur heard. Pulmonary/Chest: Effort normal and breath sounds normal. No respiratory distress.  Abdominal: Soft. She exhibits no distension. There is no tenderness.  Diffuse abdominal tenderness without focality. No peritonitis. No distention  Musculoskeletal: She exhibits no edema or tenderness.  Neurological: She is alert.  Skin: Skin is warm and dry.  Psychiatric: She has a normal mood and affect. Her behavior is normal. Thought content normal.  Nursing note and vitals reviewed.    ED Treatments / Results  Labs (all labs ordered are listed, but only abnormal results are displayed) Labs Reviewed  CBC WITH DIFFERENTIAL/PLATELET - Abnormal; Notable for the following:       Result Value   WBC 37.3 (*)    Platelets 472 (*)    Neutro Abs 33.2 (*)    All other components within normal limits  BLOOD GAS, VENOUS - Abnormal; Notable for the following:    pH, Ven 7.085 (*)    pCO2, Ven 23.5 (*)    pO2, Ven 50.3 (*)     Bicarbonate 6.7 (*)    Acid-base deficit 22.5 (*)    All other components within normal limits  BASIC METABOLIC PANEL - Abnormal; Notable for the following:    Sodium 124 (*)    Potassium 5.7 (*)    Chloride 93 (*)    CO2 <7 (*)    Glucose, Bld 587 (*)    BUN 25 (*)    Creatinine, Ser 1.38 (*)    GFR calc non Af Amer 46 (*)    GFR calc Af Amer 54 (*)    All other components within normal limits  URINALYSIS, ROUTINE W REFLEX  MICROSCOPIC - Abnormal; Notable for the following:    Color, Urine STRAW (*)    Glucose, UA >=500 (*)    Hgb urine dipstick SMALL (*)    Ketones, ur 80 (*)    Squamous Epithelial / LPF 0-5 (*)    All other components within normal limits  TSH - Abnormal; Notable for the following:    TSH 0.011 (*)    All other components within normal limits  BASIC METABOLIC PANEL - Abnormal; Notable for the following:    Chloride 114 (*)    CO2 10 (*)    Glucose, Bld 164 (*)    Calcium 7.4 (*)    All other components within normal limits  BASIC METABOLIC PANEL - Abnormal; Notable for the following:    Sodium 134 (*)    Chloride 114 (*)    CO2 12 (*)    Glucose, Bld 149 (*)    Calcium 7.6 (*)    All other components within normal limits  BASIC METABOLIC PANEL - Abnormal; Notable for the following:    Chloride 113 (*)    CO2 15 (*)    Glucose, Bld 111 (*)    Calcium 8.2 (*)    All other components within normal limits  MAGNESIUM - Abnormal; Notable for the following:    Magnesium 1.3 (*)    All other components within normal limits  PHOSPHORUS - Abnormal; Notable for the following:    Phosphorus 1.8 (*)    All other components within normal limits  CBC - Abnormal; Notable for the following:    WBC 20.3 (*)    Hemoglobin 10.6 (*)    HCT 31.1 (*)    All other components within normal limits  BASIC METABOLIC PANEL - Abnormal; Notable for the following:    Sodium 130 (*)    Potassium 2.9 (*)    CO2 12 (*)    Glucose, Bld 197 (*)    Calcium 8.2 (*)    All  other components within normal limits  GLUCOSE, CAPILLARY - Abnormal; Notable for the following:    Glucose-Capillary 178 (*)    All other components within normal limits  GLUCOSE, CAPILLARY - Abnormal; Notable for the following:    Glucose-Capillary 184 (*)    All other components within normal limits  GLUCOSE, CAPILLARY - Abnormal; Notable for the following:    Glucose-Capillary 186 (*)    All other components within normal limits  BASIC METABOLIC PANEL - Abnormal; Notable for the following:    Sodium 132 (*)    Potassium 3.2 (*)    CO2 14 (*)    Glucose, Bld 176 (*)    BUN <5 (*)    Calcium 8.0 (*)    All other components within normal limits  GLUCOSE, CAPILLARY - Abnormal; Notable for the following:    Glucose-Capillary 144 (*)    All other components within normal limits  GLUCOSE, CAPILLARY - Abnormal; Notable for the following:    Glucose-Capillary 151 (*)    All other components within normal limits  GLUCOSE, CAPILLARY - Abnormal; Notable for the following:    Glucose-Capillary 129 (*)    All other components within normal limits  GLUCOSE, CAPILLARY - Abnormal; Notable for the following:    Glucose-Capillary 113 (*)    All other components within normal limits  GLUCOSE, CAPILLARY - Abnormal; Notable for the following:    Glucose-Capillary 129 (*)    All other components within normal limits  GLUCOSE, CAPILLARY - Abnormal;  Notable for the following:    Glucose-Capillary 148 (*)    All other components within normal limits  GLUCOSE, CAPILLARY - Abnormal; Notable for the following:    Glucose-Capillary 222 (*)    All other components within normal limits  GLUCOSE, CAPILLARY - Abnormal; Notable for the following:    Glucose-Capillary 243 (*)    All other components within normal limits  GLUCOSE, CAPILLARY - Abnormal; Notable for the following:    Glucose-Capillary 146 (*)    All other components within normal limits  BASIC METABOLIC PANEL - Abnormal; Notable for the  following:    Potassium 3.1 (*)    Chloride 112 (*)    CO2 19 (*)    Glucose, Bld 193 (*)    BUN <5 (*)    Calcium 8.3 (*)    All other components within normal limits  BASIC METABOLIC PANEL - Abnormal; Notable for the following:    Sodium 133 (*)    Potassium 3.1 (*)    CO2 16 (*)    Glucose, Bld 134 (*)    Calcium 8.2 (*)    All other components within normal limits  GLUCOSE, CAPILLARY - Abnormal; Notable for the following:    Glucose-Capillary 203 (*)    All other components within normal limits  GLUCOSE, CAPILLARY - Abnormal; Notable for the following:    Glucose-Capillary 131 (*)    All other components within normal limits  GLUCOSE, CAPILLARY - Abnormal; Notable for the following:    Glucose-Capillary 149 (*)    All other components within normal limits  GLUCOSE, CAPILLARY - Abnormal; Notable for the following:    Glucose-Capillary 148 (*)    All other components within normal limits  GLUCOSE, CAPILLARY - Abnormal; Notable for the following:    Glucose-Capillary 166 (*)    All other components within normal limits  PHOSPHORUS - Abnormal; Notable for the following:    Phosphorus 1.5 (*)    All other components within normal limits  GLUCOSE, CAPILLARY - Abnormal; Notable for the following:    Glucose-Capillary 160 (*)    All other components within normal limits  GLUCOSE, CAPILLARY - Abnormal; Notable for the following:    Glucose-Capillary 135 (*)    All other components within normal limits  GLUCOSE, CAPILLARY - Abnormal; Notable for the following:    Glucose-Capillary 165 (*)    All other components within normal limits  GLUCOSE, CAPILLARY - Abnormal; Notable for the following:    Glucose-Capillary 133 (*)    All other components within normal limits  GLUCOSE, CAPILLARY - Abnormal; Notable for the following:    Glucose-Capillary 167 (*)    All other components within normal limits  GLUCOSE, CAPILLARY - Abnormal; Notable for the following:    Glucose-Capillary  319 (*)    All other components within normal limits  GLUCOSE, CAPILLARY - Abnormal; Notable for the following:    Glucose-Capillary 291 (*)    All other components within normal limits  GLUCOSE, CAPILLARY - Abnormal; Notable for the following:    Glucose-Capillary 286 (*)    All other components within normal limits  GLUCOSE, CAPILLARY - Abnormal; Notable for the following:    Glucose-Capillary 281 (*)    All other components within normal limits  BASIC METABOLIC PANEL - Abnormal; Notable for the following:    Sodium 133 (*)    Potassium 3.1 (*)    CO2 15 (*)    Glucose, Bld 299 (*)    Calcium 8.3 (*)  All other components within normal limits  GLUCOSE, CAPILLARY - Abnormal; Notable for the following:    Glucose-Capillary 100 (*)    All other components within normal limits  GLUCOSE, CAPILLARY - Abnormal; Notable for the following:    Glucose-Capillary 298 (*)    All other components within normal limits  GLUCOSE, CAPILLARY - Abnormal; Notable for the following:    Glucose-Capillary 374 (*)    All other components within normal limits  BASIC METABOLIC PANEL - Abnormal; Notable for the following:    Sodium 129 (*)    Chloride 99 (*)    CO2 12 (*)    Glucose, Bld 474 (*)    Calcium 8.4 (*)    Anion gap 18 (*)    All other components within normal limits  GLUCOSE, CAPILLARY - Abnormal; Notable for the following:    Glucose-Capillary 432 (*)    All other components within normal limits  BASIC METABOLIC PANEL - Abnormal; Notable for the following:    Potassium 3.2 (*)    CO2 20 (*)    Glucose, Bld 178 (*)    Calcium 8.0 (*)    All other components within normal limits  BASIC METABOLIC PANEL - Abnormal; Notable for the following:    Chloride 112 (*)    CO2 18 (*)    BUN <5 (*)    Calcium 8.4 (*)    All other components within normal limits  BASIC METABOLIC PANEL - Abnormal; Notable for the following:    CO2 19 (*)    Glucose, Bld 108 (*)    BUN <5 (*)    Calcium 8.5  (*)    All other components within normal limits  HEMOGLOBIN A1C - Abnormal; Notable for the following:    Hgb A1c MFr Bld 9.1 (*)    All other components within normal limits  T4, FREE - Abnormal; Notable for the following:    Free T4 1.26 (*)    All other components within normal limits  CBC - Abnormal; Notable for the following:    RBC 3.64 (*)    Hemoglobin 10.1 (*)    HCT 29.2 (*)    All other components within normal limits  GLUCOSE, CAPILLARY - Abnormal; Notable for the following:    Glucose-Capillary 285 (*)    All other components within normal limits  GLUCOSE, CAPILLARY - Abnormal; Notable for the following:    Glucose-Capillary 261 (*)    All other components within normal limits  GLUCOSE, CAPILLARY - Abnormal; Notable for the following:    Glucose-Capillary 212 (*)    All other components within normal limits  GLUCOSE, CAPILLARY - Abnormal; Notable for the following:    Glucose-Capillary 142 (*)    All other components within normal limits  GLUCOSE, CAPILLARY - Abnormal; Notable for the following:    Glucose-Capillary 100 (*)    All other components within normal limits  GLUCOSE, CAPILLARY - Abnormal; Notable for the following:    Glucose-Capillary 100 (*)    All other components within normal limits  GLUCOSE, CAPILLARY - Abnormal; Notable for the following:    Glucose-Capillary 168 (*)    All other components within normal limits  BASIC METABOLIC PANEL - Abnormal; Notable for the following:    Sodium 133 (*)    Potassium 3.3 (*)    CO2 18 (*)    Glucose, Bld 242 (*)    Calcium 8.6 (*)    All other components within normal limits  GLUCOSE, CAPILLARY -  Abnormal; Notable for the following:    Glucose-Capillary 234 (*)    All other components within normal limits  GLUCOSE, CAPILLARY - Abnormal; Notable for the following:    Glucose-Capillary 176 (*)    All other components within normal limits  GLUCOSE, CAPILLARY - Abnormal; Notable for the following:     Glucose-Capillary 179 (*)    All other components within normal limits  CBC - Abnormal; Notable for the following:    RBC 3.58 (*)    Hemoglobin 9.8 (*)    HCT 28.5 (*)    All other components within normal limits  BASIC METABOLIC PANEL - Abnormal; Notable for the following:    Potassium 3.4 (*)    Glucose, Bld 61 (*)    BUN <5 (*)    Calcium 8.5 (*)    All other components within normal limits  GLUCOSE, CAPILLARY - Abnormal; Notable for the following:    Glucose-Capillary 179 (*)    All other components within normal limits  GLUCOSE, CAPILLARY - Abnormal; Notable for the following:    Glucose-Capillary 278 (*)    All other components within normal limits  GLUCOSE, CAPILLARY - Abnormal; Notable for the following:    Glucose-Capillary 146 (*)    All other components within normal limits  BASIC METABOLIC PANEL - Abnormal; Notable for the following:    BUN <5 (*)    Calcium 8.7 (*)    All other components within normal limits  MAGNESIUM - Abnormal; Notable for the following:    Magnesium 1.5 (*)    All other components within normal limits  GLUCOSE, CAPILLARY - Abnormal; Notable for the following:    Glucose-Capillary 165 (*)    All other components within normal limits  GLUCOSE, CAPILLARY - Abnormal; Notable for the following:    Glucose-Capillary 58 (*)    All other components within normal limits  GLUCOSE, CAPILLARY - Abnormal; Notable for the following:    Glucose-Capillary 220 (*)    All other components within normal limits  CBG MONITORING, ED - Abnormal; Notable for the following:    Glucose-Capillary 577 (*)    All other components within normal limits  CBG MONITORING, ED - Abnormal; Notable for the following:    Glucose-Capillary 534 (*)    All other components within normal limits  CBG MONITORING, ED - Abnormal; Notable for the following:    Glucose-Capillary 289 (*)    All other components within normal limits  CBG MONITORING, ED - Abnormal; Notable for the  following:    Glucose-Capillary 227 (*)    All other components within normal limits  CULTURE, BLOOD (ROUTINE X 2)  CULTURE, BLOOD (ROUTINE X 2)  MRSA PCR SCREENING  LACTIC ACID, PLASMA  MAGNESIUM  PROCALCITONIN  TROPONIN I  MAGNESIUM  MAGNESIUM  PHOSPHORUS  GLUCOSE, CAPILLARY  GLUCOSE, CAPILLARY  GLUCOSE, CAPILLARY  GLUCOSE, CAPILLARY  GLUCOSE, CAPILLARY  GLUCOSE, CAPILLARY    EKG  EKG Interpretation  Date/Time:  Wednesday May 05 2017 12:55:23 EDT Ventricular Rate:  119 PR Interval:    QRS Duration: 95 QT Interval:  299 QTC Calculation: 421 R Axis:   64 Text Interpretation:  Sinus tachycardia Consider right atrial enlargement ST elev, probable normal early repol pattern consider hyperkalemia Confirmed by Virgel Manifold [phone]) on 05/05/2017 1:39:42 PM       Radiology No results found.  Procedures Procedures (including critical care time)  CRITICAL CARE Performed by: Virgel Manifold Total critical care time: 35 minutes Critical care time was exclusive  of separately billable procedures and treating other patients. Critical care was necessary to treat or prevent imminent or life-threatening deterioration. Critical care was time spent personally by me on the following activities: development of treatment plan with patient and/or surrogate as well as nursing, discussions with consultants, evaluation of patient's response to treatment, examination of patient, obtaining history from patient or surrogate, ordering and performing treatments and interventions, ordering and review of laboratory studies, ordering and review of radiographic studies, pulse oximetry and re-evaluation of patient's condition.   Medications Ordered in ED Medications  0.9 %  sodium chloride infusion ( Intravenous Stopped 05/05/17 2303)  0.9 %  sodium chloride infusion ( Intravenous New Bag/Given 05/07/17 1735)  sodium chloride 0.9 % bolus 1,000 mL (0 mLs Intravenous Stopped 05/05/17 1453)    ondansetron (ZOFRAN) injection 4 mg (4 mg Intravenous Given 05/05/17 1353)  sodium chloride 0.9 % bolus 2,000 mL (0 mLs Intravenous Stopped 05/05/17 1744)  promethazine (PHENERGAN) injection 12.5 mg (12.5 mg Intravenous Given 05/05/17 1649)  sodium chloride 0.9 % bolus 2,000 mL (0 mLs Intravenous Stopped 05/05/17 2020)  potassium PHOSPHATE 30 mmol in dextrose 5 % 500 mL infusion (0 mmol Intravenous Stopped 05/06/17 1554)  magnesium sulfate IVPB 4 g 100 mL (0 g Intravenous Stopped 05/06/17 0804)  magnesium sulfate IVPB 2 g 50 mL (0 g Intravenous Stopped 05/07/17 1501)  potassium PHOSPHATE 30 mmol in dextrose 5 % 500 mL infusion (0 mmol Intravenous Stopped 05/07/17 2102)  insulin aspart (novoLOG) injection 15 Units (15 Units Subcutaneous Given 05/07/17 1532)  potassium chloride SA (K-DUR,KLOR-CON) CR tablet 40 mEq (40 mEq Oral Given 05/07/17 2307)  loperamide (IMODIUM) capsule 4 mg (4 mg Oral Given 05/08/17 0816)  potassium chloride SA (K-DUR,KLOR-CON) CR tablet 40 mEq (40 mEq Oral Given 05/08/17 0816)  potassium chloride 10 mEq in 100 mL IVPB (0 mEq Intravenous Stopped 05/08/17 2015)  potassium chloride SA (K-DUR,KLOR-CON) CR tablet 40 mEq (40 mEq Oral Given 05/09/17 1151)  magnesium sulfate IVPB 2 g 50 mL (0 g Intravenous Stopped 05/10/17 1115)     Initial Impression / Assessment and Plan / ED Course  I have reviewed the triage vital signs and the nursing notes.  Pertinent labs & imaging results that were available during my care of the patient were reviewed by me and considered in my medical decision making (see chart for details).     41 year old female with abdominal pain and nausea/vomiting/diarrhea. She is in DKA. IV fluids. Insulin drip. Electrolyte management. Admission.  Final Clinical Impressions(s) / ED Diagnoses   Final diagnoses:  Diabetic ketoacidosis without coma associated with other specified diabetes mellitus ([place])    New Prescriptions Discharge Medication List as of 05/10/2017  1:03 PM     START taking these medications   Details  insulin aspart protamine- aspart (NOVOLOG MIX 70/30) (70-30) 100 UNIT/ML injection Take 12 units in AM (withbreakfast) daily and 10 units at night (with supper) daily., Print    INSULIN SYRINGE .5CC/28G (INS SYRINGE/NEEDLE .5CC/28G) 28G X 1/2" 0.5 ML MISC Use as instructed to administer insulin, Print    loratadine (CLARITIN) 10 MG tablet Take 1 tablet (10 mg total) by mouth daily., Starting Mon 05/10/2017, Until Thu 05/20/2017, Print    pantoprazole (PROTONIX) 40 MG tablet Take 1 tablet (40 mg total) by mouth daily at 12 noon., Starting Mon 05/10/2017, Print    sucralfate (CARAFATE) 1 GM/10ML suspension Take 10 mLs (1 g total) by mouth 4 (four) times daily -  with meals and at bedtime.,  Starting Mon 05/10/2017, Until Sat 05/15/2017, Print         Virgel Manifold, MD 05/23/17 [phone]

## 2017-05-24 DIAGNOSIS — E039 Hypothyroidism, unspecified: Secondary | ICD-10-CM | POA: Diagnosis not present

## 2017-05-24 DIAGNOSIS — E109 Type 1 diabetes mellitus without complications: Secondary | ICD-10-CM | POA: Diagnosis not present

## 2017-05-24 DIAGNOSIS — E7800 Pure hypercholesterolemia, unspecified: Secondary | ICD-10-CM | POA: Diagnosis not present

## 2017-06-02 DIAGNOSIS — E039 Hypothyroidism, unspecified: Secondary | ICD-10-CM | POA: Diagnosis not present

## 2017-06-08 ENCOUNTER — Encounter: Payer: Self-pay | Admitting: Physician Assistant

## 2017-06-08 ENCOUNTER — Ambulatory Visit (INDEPENDENT_AMBULATORY_CARE_PROVIDER_SITE_OTHER): Payer: Medicare Other | Admitting: Physician Assistant

## 2017-06-08 VITALS — BP 110/68 | HR 66 | Ht 61.0 in | Wt 110.0 lb

## 2017-06-08 DIAGNOSIS — K3184 Gastroparesis: Secondary | ICD-10-CM

## 2017-06-08 DIAGNOSIS — E1143 Type 2 diabetes mellitus with diabetic autonomic (poly)neuropathy: Secondary | ICD-10-CM

## 2017-06-08 MED ORDER — METOCLOPRAMIDE HCL 10 MG PO TABS: ORAL_TABLET | ORAL | 3 refills | Status: DC

## 2017-06-08 MED ORDER — PROMETHAZINE HCL 25 MG PO TABS: ORAL_TABLET | ORAL | 3 refills | Status: DC

## 2017-06-08 NOTE — Progress Notes (Addendum)
Subjective:    Patient ID: Karen Hayden, female    DOB: May 09, 1976, 41 y.o.   MRN: [id number]  HPI [name] is a pleasant 41 year old white female, known to Dr. Hilarie Fredrickson who comes in today for routine follow-up. She had missed an appointment in September because she was hospitalized with DKA. She is followed here for gastroparesis in setting of insulin-dependent diabetes mellitus. Patient feels that her recent hospitalization was precipitated by failure of her insulin pump. She has been back on insulin injections since her hospitalization and says she's actually feeling better in that her sugars are much better controlled. She says she has periodic episodes of nausea and occasional vomiting but has been doing well over the past several weeks. She definitely feels that her GI symptoms correlate with higher blood sugars. She has been using metoclopramide 10 mg at bedtime regularly and Phenergan 25 mg on a when necessary basis both of which she says are quite helpful. She denies any current problems with abdominal pain, changes in bowel habits melena or hematochezia. Her appetite has been good for her and weight has been stable.  Review of Systems Pertinent positive and negative review of systems were noted in the above HPI section.  All other review of systems was otherwise negative.  Outpatient Encounter Prescriptions as of 06/08/2017  Medication Sig  . gabapentin (NEURONTIN) 300 MG capsule Take 300 mg by mouth 3 (three) times daily.  . Insulin Lispro (HUMALOG [place]) Inject into the skin. 12 units in the AM and 14 units in the PM  . insulin lispro (HUMALOG) 100 UNIT/ML cartridge Inject into the skin 4 (four) times daily. Carb counts with her meals to get dosage  . INSULIN SYRINGE .5CC/28G (INS SYRINGE/NEEDLE .5CC/28G) 28G X 1/2" 0.5 ML MISC Use as instructed to administer insulin  . levothyroxine (SYNTHROID, LEVOTHROID) 100 MCG tablet Take 100 mcg by mouth daily.  . metoCLOPramide (REGLAN) 10 MG tablet Take  1 tab at bedtime.  . Multiple Vitamin (MULTIVITAMIN WITH MINERALS) TABS tablet Take 1 tablet by mouth daily.  . promethazine (PHENERGAN) 25 MG tablet Take 1 tab by mouth every 6-8 hours as needed for nausea & vomiting.  . trimethoprim (TRIMPEX) 100 MG tablet Take 100 mg by mouth daily. Long term med  . [DISCONTINUED] metoCLOPramide (REGLAN) 10 MG tablet Take 1 tablet every morning and 1 tablet at bedtime  . [DISCONTINUED] promethazine (PHENERGAN) 25 MG tablet Take 1 tablet (25 mg total) by mouth every 6 (six) hours as needed for nausea or vomiting.  . [DISCONTINUED] insulin aspart protamine- aspart (NOVOLOG MIX 70/30) (70-30) 100 UNIT/ML injection Take 12 units in AM (withbreakfast) daily and 10 units at night (with supper) daily.  . [DISCONTINUED] loratadine (CLARITIN) 10 MG tablet Take 1 tablet (10 mg total) by mouth daily.  . [DISCONTINUED] ondansetron (ZOFRAN ODT) 4 MG disintegrating tablet 4mg  ODT q4 hours prn nausea/vomit (Patient taking differently: Take 4 mg by mouth every 4 (four) hours as needed for nausea. )  . [DISCONTINUED] pantoprazole (PROTONIX) 40 MG tablet Take 1 tablet (40 mg total) by mouth daily at 12 noon.  . [DISCONTINUED] sucralfate (CARAFATE) 1 GM/10ML suspension Take 10 mLs (1 g total) by mouth 4 (four) times daily -  with meals and at bedtime.   No facility-administered encounter medications on file as of 06/08/2017.    Allergies  Allergen Reactions  . Lipitor [Atorvastatin]     Attacks joints, inflamation  . Codeine Rash   Patient Active Problem List  Diagnosis Date Noted  . Diabetic ketoacidosis without coma associated with type 1 diabetes mellitus ([place])   . Protein-calorie malnutrition, severe ([place]) 01/29/2014  . Nausea & vomiting 01/26/2014  . Unspecified constipation 01/26/2014  . Cough 01/26/2014  . Tobacco use disorder 01/26/2014  . Left sided abdominal pain 06/21/2013  . Gastroparesis 06/21/2013  . Nausea with vomiting 06/21/2013  . Other general  symptoms(780.99)  06/21/2013  . Change in bowel habits 06/21/2013  . Diabetic gastroparesis ([place]) 06/04/2011  . Type 1 diabetes mellitus ([place]) 01/04/2009  . Acute bronchitis 01/04/2009  . SEIZURE DISORDER 01/04/2009   Social History   Social History  . Marital status: Legally Separated    Spouse name: N/A  . Number of children: 2  . Years of education: N/A   Occupational History  . Vet Tech     [employer]   Social History Main Topics  . Smoking status: Current Every Day Smoker    Packs/day: 0.50    Years: 13.00    Types: Cigarettes  . Smokeless tobacco: Never Used     Comment: Tobacco info given  . Alcohol use No  . Drug use: Yes    Types: Marijuana  . Sexual activity: Yes    Birth control/ protection: Surgical   Other Topics Concern  . Not on file   Social History Narrative  . No narrative on file    Ms. Lofaso's family history includes Diabetes in her mother; Heart attack in her mother; Ovarian cancer in her paternal grandmother.      Objective:    Vitals:   06/08/17 0916  BP: 110/68  Pulse: 66    Physical Exam  well-developed white female in no acute distress, very pleasant blood pressure 110/68 pulse 66, height 5 foot 1, weight 110, BMI 20.7. HEENT; nontraumatic normocephalic EOMI PERRLA sclera anicteric, Cardiovascular ;regular rate and rhythm with S1-S2 no murmur or gallop, Pulmonary; clear bilaterally, Abdomen; soft, nondistended nontender BS are active there is no palpable mass or hepatosplenomegaly, Rectal ;exam not done, Extremities; no clubbing cyanosis or edema skin warm and dry, Neuropsych; mood and affect appropriate       Assessment & Plan:   #19 41 year old female insulin-dependent diabetic with presumed diabetic gastroparesis with periodic nausea and vomiting. Pt  had recent hospitalization September 2018 with DKA secondary to malfunction of her insulin pump. She has been stable from a GI standpoint on current regimen  #2 history of  seizure disorder  Plan; continue gastroparesis diet Refill metoclopramide 10 mg by mouth daily at bedtime Refill Phenergan 25 mg by mouth every 6-8 hours when necessary for nausea, patient only using periodically. Patient is asked to follow up with Dr. Hilarie Fredrickson or myself in about 6 months.    Amy S Esterwood PA-C 06/08/2017   Cc: Jacelyn Pi, MD  Addendum: Reviewed and agree with ongoing management. Pyrtle, Lajuan Lines, MD

## 2017-06-08 NOTE — Patient Instructions (Signed)
We have sent the following medications to your pharmacy for you to pick up at your convenience: [facility], [place], [place]. 1. Reglan 10 mg 2. Phenergan 25 mg  IIf you are age 42 or younger, your body mass index should be between 19-25. Your Body mass index is 20.78 kg/m. If this is out of the aformentioned range listed, please consider follow up with your Primary Care Provider.  We have provided you with a work note for today.

## 2017-07-14 DIAGNOSIS — E039 Hypothyroidism, unspecified: Secondary | ICD-10-CM | POA: Diagnosis not present

## 2017-09-02 DIAGNOSIS — E039 Hypothyroidism, unspecified: Secondary | ICD-10-CM | POA: Diagnosis not present

## 2017-09-06 DIAGNOSIS — R000 Tachycardia, unspecified: Secondary | ICD-10-CM | POA: Diagnosis not present

## 2017-09-06 DIAGNOSIS — E0590 Thyrotoxicosis, unspecified without thyrotoxic crisis or storm: Secondary | ICD-10-CM | POA: Diagnosis not present

## 2017-09-06 DIAGNOSIS — E109 Type 1 diabetes mellitus without complications: Secondary | ICD-10-CM | POA: Diagnosis not present

## 2017-09-20 DIAGNOSIS — E7800 Pure hypercholesterolemia, unspecified: Secondary | ICD-10-CM | POA: Diagnosis not present

## 2017-09-20 DIAGNOSIS — E039 Hypothyroidism, unspecified: Secondary | ICD-10-CM | POA: Diagnosis not present

## 2017-09-20 DIAGNOSIS — E109 Type 1 diabetes mellitus without complications: Secondary | ICD-10-CM | POA: Diagnosis not present

## 2017-09-21 DIAGNOSIS — J069 Acute upper respiratory infection, unspecified: Secondary | ICD-10-CM | POA: Diagnosis not present

## 2017-09-21 DIAGNOSIS — E109 Type 1 diabetes mellitus without complications: Secondary | ICD-10-CM | POA: Diagnosis not present

## 2017-09-21 DIAGNOSIS — E0590 Thyrotoxicosis, unspecified without thyrotoxic crisis or storm: Secondary | ICD-10-CM | POA: Diagnosis not present

## 2017-11-02 DIAGNOSIS — E0590 Thyrotoxicosis, unspecified without thyrotoxic crisis or storm: Secondary | ICD-10-CM | POA: Diagnosis not present

## 2017-11-08 DIAGNOSIS — L709 Acne, unspecified: Secondary | ICD-10-CM | POA: Diagnosis not present

## 2017-11-08 DIAGNOSIS — E109 Type 1 diabetes mellitus without complications: Secondary | ICD-10-CM | POA: Diagnosis not present

## 2017-11-08 DIAGNOSIS — E7800 Pure hypercholesterolemia, unspecified: Secondary | ICD-10-CM | POA: Diagnosis not present

## 2017-11-08 DIAGNOSIS — E039 Hypothyroidism, unspecified: Secondary | ICD-10-CM | POA: Diagnosis not present

## 2017-11-08 DIAGNOSIS — E0590 Thyrotoxicosis, unspecified without thyrotoxic crisis or storm: Secondary | ICD-10-CM | POA: Diagnosis not present

## 2017-11-08 DIAGNOSIS — G609 Hereditary and idiopathic neuropathy, unspecified: Secondary | ICD-10-CM | POA: Diagnosis not present

## 2017-11-08 DIAGNOSIS — K3184 Gastroparesis: Secondary | ICD-10-CM | POA: Diagnosis not present

## 2017-11-29 DIAGNOSIS — E109 Type 1 diabetes mellitus without complications: Secondary | ICD-10-CM | POA: Diagnosis not present

## 2017-12-06 DIAGNOSIS — E0590 Thyrotoxicosis, unspecified without thyrotoxic crisis or storm: Secondary | ICD-10-CM | POA: Diagnosis not present

## 2018-01-13 DIAGNOSIS — E039 Hypothyroidism, unspecified: Secondary | ICD-10-CM | POA: Diagnosis not present

## 2018-02-17 ENCOUNTER — Emergency Department (HOSPITAL_COMMUNITY): Payer: Medicare Other

## 2018-02-17 ENCOUNTER — Encounter (HOSPITAL_COMMUNITY): Payer: Self-pay

## 2018-02-17 ENCOUNTER — Observation Stay (HOSPITAL_COMMUNITY)
Admission: EM | Admit: 2018-02-17 | Discharge: 2018-02-19 | Disposition: A | Discharge status: Home / Self Care | Payer: Medicare Other | Attending: Internal Medicine | Admitting: Internal Medicine

## 2018-02-17 ENCOUNTER — Other Ambulatory Visit: Payer: Self-pay

## 2018-02-17 DIAGNOSIS — E10649 Type 1 diabetes mellitus with hypoglycemia without coma: Secondary | ICD-10-CM | POA: Insufficient documentation

## 2018-02-17 DIAGNOSIS — I200 Unstable angina: Secondary | ICD-10-CM | POA: Diagnosis present

## 2018-02-17 DIAGNOSIS — E785 Hyperlipidemia, unspecified: Secondary | ICD-10-CM | POA: Insufficient documentation

## 2018-02-17 DIAGNOSIS — E039 Hypothyroidism, unspecified: Secondary | ICD-10-CM | POA: Insufficient documentation

## 2018-02-17 DIAGNOSIS — E109 Type 1 diabetes mellitus without complications: Secondary | ICD-10-CM | POA: Diagnosis present

## 2018-02-17 DIAGNOSIS — Z90721 Acquired absence of ovaries, unilateral: Secondary | ICD-10-CM | POA: Diagnosis not present

## 2018-02-17 DIAGNOSIS — Z79899 Other long term (current) drug therapy: Secondary | ICD-10-CM | POA: Insufficient documentation

## 2018-02-17 DIAGNOSIS — E1043 Type 1 diabetes mellitus with diabetic autonomic (poly)neuropathy: Secondary | ICD-10-CM | POA: Insufficient documentation

## 2018-02-17 DIAGNOSIS — R079 Chest pain, unspecified: Secondary | ICD-10-CM

## 2018-02-17 DIAGNOSIS — Z794 Long term (current) use of insulin: Secondary | ICD-10-CM | POA: Insufficient documentation

## 2018-02-17 DIAGNOSIS — F17200 Nicotine dependence, unspecified, uncomplicated: Secondary | ICD-10-CM | POA: Diagnosis not present

## 2018-02-17 DIAGNOSIS — Z23 Encounter for immunization: Secondary | ICD-10-CM | POA: Insufficient documentation

## 2018-02-17 DIAGNOSIS — I25110 Atherosclerotic heart disease of native coronary artery with unstable angina pectoris: Principal | ICD-10-CM | POA: Insufficient documentation

## 2018-02-17 DIAGNOSIS — F1220 Cannabis dependence, uncomplicated: Secondary | ICD-10-CM | POA: Diagnosis not present

## 2018-02-17 DIAGNOSIS — Z8249 Family history of ischemic heart disease and other diseases of the circulatory system: Secondary | ICD-10-CM | POA: Insufficient documentation

## 2018-02-17 DIAGNOSIS — I420 Dilated cardiomyopathy: Secondary | ICD-10-CM | POA: Insufficient documentation

## 2018-02-17 DIAGNOSIS — E108 Type 1 diabetes mellitus with unspecified complications: Secondary | ICD-10-CM

## 2018-02-17 DIAGNOSIS — Z90711 Acquired absence of uterus with remaining cervical stump: Secondary | ICD-10-CM | POA: Diagnosis not present

## 2018-02-17 DIAGNOSIS — Z888 Allergy status to other drugs, medicaments and biological substances status: Secondary | ICD-10-CM | POA: Insufficient documentation

## 2018-02-17 DIAGNOSIS — R531 Weakness: Secondary | ICD-10-CM | POA: Diagnosis not present

## 2018-02-17 DIAGNOSIS — G40909 Epilepsy, unspecified, not intractable, without status epilepticus: Secondary | ICD-10-CM | POA: Insufficient documentation

## 2018-02-17 DIAGNOSIS — Z9889 Other specified postprocedural states: Secondary | ICD-10-CM | POA: Insufficient documentation

## 2018-02-17 DIAGNOSIS — Z833 Family history of diabetes mellitus: Secondary | ICD-10-CM | POA: Diagnosis not present

## 2018-02-17 DIAGNOSIS — F17210 Nicotine dependence, cigarettes, uncomplicated: Secondary | ICD-10-CM | POA: Diagnosis not present

## 2018-02-17 DIAGNOSIS — Z885 Allergy status to narcotic agent status: Secondary | ICD-10-CM | POA: Diagnosis not present

## 2018-02-17 DIAGNOSIS — K3184 Gastroparesis: Secondary | ICD-10-CM | POA: Diagnosis not present

## 2018-02-17 DIAGNOSIS — R0789 Other chest pain: Secondary | ICD-10-CM | POA: Diagnosis not present

## 2018-02-17 DIAGNOSIS — I10 Essential (primary) hypertension: Secondary | ICD-10-CM | POA: Insufficient documentation

## 2018-02-17 DIAGNOSIS — E1143 Type 2 diabetes mellitus with diabetic autonomic (poly)neuropathy: Secondary | ICD-10-CM | POA: Diagnosis present

## 2018-02-17 DIAGNOSIS — Z7982 Long term (current) use of aspirin: Secondary | ICD-10-CM | POA: Insufficient documentation

## 2018-02-17 DIAGNOSIS — E782 Mixed hyperlipidemia: Secondary | ICD-10-CM | POA: Diagnosis not present

## 2018-02-17 DIAGNOSIS — E1165 Type 2 diabetes mellitus with hyperglycemia: Secondary | ICD-10-CM | POA: Diagnosis not present

## 2018-02-17 DIAGNOSIS — R0602 Shortness of breath: Secondary | ICD-10-CM | POA: Diagnosis not present

## 2018-02-17 HISTORY — DX: Hypothyroidism, unspecified: E03.9

## 2018-02-17 LAB — GLUCOSE, CAPILLARY
GLUCOSE-CAPILLARY: 116 mg/dL — AB (ref 65–99)
Glucose-Capillary: 296 mg/dL — ABNORMAL HIGH (ref 65–99)
Glucose-Capillary: 111 mg/dL — ABNORMAL HIGH (ref 65–99)
Glucose-Capillary: 61 mg/dL — ABNORMAL LOW (ref 65–99)
Glucose-Capillary: 83 mg/dL (ref 65–99)

## 2018-02-17 LAB — I-STAT VENOUS BLOOD GAS, ED
Acid-Base Excess: 1 mmol/L (ref 0.0–2.0)
BICARBONATE: 23.5 mmol/L (ref 20.0–28.0)
O2 Saturation: 100 %
TCO2: 24 mmol/L (ref 22–32)
pCO2, Ven: 31.7 mmHg — ABNORMAL LOW (ref 44.0–60.0)
pH, Ven: 7.479 — ABNORMAL HIGH (ref 7.250–7.430)
pO2, Ven: 193 mmHg — ABNORMAL HIGH (ref 32.0–45.0)

## 2018-02-17 LAB — TSH: TSH: 0.141 u[IU]/mL — AB (ref 0.350–4.500)

## 2018-02-17 LAB — CBC
HEMATOCRIT: 42.9 % (ref 36.0–46.0)
Hemoglobin: 13.9 g/dL (ref 12.0–15.0)
MCH: 27.6 pg (ref 26.0–34.0)
MCHC: 32.4 g/dL (ref 30.0–36.0)
MCV: 85.3 fL (ref 78.0–100.0)
Platelets: 350 10*3/uL (ref 150–400)
RBC: 5.03 MIL/uL (ref 3.87–5.11)
RDW: 13.2 % (ref 11.5–15.5)
WBC: 9 10*3/uL (ref 4.0–10.5)

## 2018-02-17 LAB — BASIC METABOLIC PANEL
ANION GAP: 10 (ref 5–15)
BUN: 10 mg/dL (ref 6–20)
CO2: 20 mmol/L — ABNORMAL LOW (ref 22–32)
Calcium: 9.5 mg/dL (ref 8.9–10.3)
Chloride: 107 mmol/L (ref 101–111)
Creatinine, Ser: 0.86 mg/dL (ref 0.44–1.00)
GFR calc Af Amer: >60 mL/min (ref >60)
GFR calc non Af Amer: >60 mL/min (ref >60)
Glucose, Bld: 234 mg/dL — ABNORMAL HIGH (ref 65–99)
POTASSIUM: 4.8 mmol/L (ref 3.5–5.1)
SODIUM: 137 mmol/L (ref 135–145)

## 2018-02-17 LAB — I-STAT TROPONIN, ED: Troponin i, poc: 0 ng/mL (ref 0.00–0.08)

## 2018-02-17 LAB — HEMOGLOBIN A1C
HEMOGLOBIN A1C: 7.7 % — AB (ref 4.8–5.6)
Mean Plasma Glucose: 174.29 mg/dL

## 2018-02-17 LAB — T4, FREE: Free T4: 0.7 ng/dL — ABNORMAL LOW (ref 0.82–1.77)

## 2018-02-17 LAB — MRSA PCR SCREENING: MRSA by PCR: NEGATIVE

## 2018-02-17 LAB — TROPONIN I: Troponin I: <0.03 ng/mL (ref <0.03)

## 2018-02-17 LAB — CBG MONITORING, ED: Glucose-Capillary: 77 mg/dL (ref 65–99)

## 2018-02-17 MED ORDER — HEPARIN BOLUS VIA INFUSION
3200.0000 [IU] | Freq: Once | INTRAVENOUS | Status: AC
  Administered 2018-02-17: 3200 [IU] via INTRAVENOUS
  Filled 2018-02-17: qty 3200

## 2018-02-17 MED ORDER — INSULIN ASPART 100 UNIT/ML ~~LOC~~ SOLN: 0.0000 [IU] | Freq: Every day | SUBCUTANEOUS | Status: DC

## 2018-02-17 MED ORDER — ASPIRIN EC 81 MG PO TBEC: 81.0000 mg | DELAYED_RELEASE_TABLET | Freq: Every day | ORAL | Status: DC

## 2018-02-17 MED ORDER — HEPARIN SODIUM (PORCINE) 5000 UNIT/ML IJ SOLN
5000.0000 [IU] | Freq: Three times a day (TID) | INTRAMUSCULAR | Status: DC
  Administered 2018-02-18 – 2018-02-19 (×4): 5000 [IU] via SUBCUTANEOUS
  Filled 2018-02-17 (×4): qty 1

## 2018-02-17 MED ORDER — MORPHINE SULFATE (PF) 4 MG/ML IV SOLN
2.0000 mg | INTRAVENOUS | Status: DC | PRN
  Administered 2018-02-17 – 2018-02-18 (×2): 2 mg via INTRAVENOUS
  Filled 2018-02-17 (×2): qty 1

## 2018-02-17 MED ORDER — ENOXAPARIN SODIUM 40 MG/0.4ML ~~LOC~~ SOLN
40.0000 mg | SUBCUTANEOUS | Status: DC
  Administered 2018-02-17: 40 mg via SUBCUTANEOUS
  Filled 2018-02-17: qty 0.4

## 2018-02-17 MED ORDER — NITROGLYCERIN 0.4 MG SL SUBL
0.4000 mg | SUBLINGUAL_TABLET | SUBLINGUAL | Status: DC | PRN
  Administered 2018-02-17 (×3): 0.4 mg via SUBLINGUAL
  Filled 2018-02-17 (×3): qty 1

## 2018-02-17 MED ORDER — NITROGLYCERIN 2 % TD OINT
1.0000 [in_us] | TOPICAL_OINTMENT | Freq: Four times a day (QID) | TRANSDERMAL | Status: DC
  Filled 2018-02-17: qty 30

## 2018-02-17 MED ORDER — SODIUM CHLORIDE 0.9 % WEIGHT BASED INFUSION
3.0000 mL/kg/h | INTRAVENOUS | Status: DC
  Administered 2018-02-18: 3 mL/kg/h via INTRAVENOUS

## 2018-02-17 MED ORDER — SODIUM CHLORIDE 0.9% FLUSH
3.0000 mL | Freq: Two times a day (BID) | INTRAVENOUS | Status: DC
  Administered 2018-02-17: 3 mL via INTRAVENOUS

## 2018-02-17 MED ORDER — LISINOPRIL 10 MG PO TABS
10.0000 mg | ORAL_TABLET | Freq: Every day | ORAL | Status: DC
  Administered 2018-02-17 – 2018-02-19 (×3): 10 mg via ORAL
  Filled 2018-02-17 (×3): qty 1

## 2018-02-17 MED ORDER — GI COCKTAIL ~~LOC~~
30.0000 mL | Freq: Four times a day (QID) | ORAL | Status: DC | PRN
Start: 2018-02-17 — End: 2018-02-19
  Administered 2018-02-18: 30 mL via ORAL
  Filled 2018-02-17: qty 30

## 2018-02-17 MED ORDER — GABAPENTIN 300 MG PO CAPS
300.0000 mg | ORAL_CAPSULE | Freq: Three times a day (TID) | ORAL | Status: DC
  Administered 2018-02-17 – 2018-02-19 (×6): 300 mg via ORAL
  Filled 2018-02-17 (×6): qty 1

## 2018-02-17 MED ORDER — INSULIN ASPART 100 UNIT/ML ~~LOC~~ SOLN
5.0000 [IU] | Freq: Once | SUBCUTANEOUS | Status: AC
  Administered 2018-02-17: 5 [IU] via SUBCUTANEOUS

## 2018-02-17 MED ORDER — INSULIN ASPART 100 UNIT/ML ~~LOC~~ SOLN
0.0000 [IU] | Freq: Three times a day (TID) | SUBCUTANEOUS | Status: DC
  Administered 2018-02-17: 5 [IU] via SUBCUTANEOUS
  Administered 2018-02-18: 1 [IU] via SUBCUTANEOUS
  Administered 2018-02-18: 5 [IU] via SUBCUTANEOUS
  Administered 2018-02-19: 9 [IU] via SUBCUTANEOUS

## 2018-02-17 MED ORDER — MORPHINE SULFATE (PF) 4 MG/ML IV SOLN
4.0000 mg | Freq: Once | INTRAVENOUS | Status: AC
  Administered 2018-02-17: 4 mg via INTRAVENOUS
  Filled 2018-02-17: qty 1

## 2018-02-17 MED ORDER — SODIUM CHLORIDE 0.9 % IV BOLUS
1000.0000 mL | Freq: Once | INTRAVENOUS | Status: AC
  Administered 2018-02-17: 1000 mL via INTRAVENOUS

## 2018-02-17 MED ORDER — INSULIN NPH (HUMAN) (ISOPHANE) 100 UNIT/ML ~~LOC~~ SUSP
10.0000 [IU] | Freq: Two times a day (BID) | SUBCUTANEOUS | Status: DC
  Administered 2018-02-17 – 2018-02-19 (×4): 10 [IU] via SUBCUTANEOUS
  Filled 2018-02-17 (×3): qty 10

## 2018-02-17 MED ORDER — SODIUM CHLORIDE 0.9% FLUSH: 3.0000 mL | INTRAVENOUS | Status: DC | PRN

## 2018-02-17 MED ORDER — SODIUM CHLORIDE 0.9 % IV SOLN: 250.0000 mL | INTRAVENOUS | Status: DC | PRN

## 2018-02-17 MED ORDER — PNEUMOCOCCAL VAC POLYVALENT 25 MCG/0.5ML IJ INJ
0.5000 mL | INJECTION | INTRAMUSCULAR | Status: AC
  Administered 2018-02-18: 0.5 mL via INTRAMUSCULAR
  Filled 2018-02-17: qty 0.5

## 2018-02-17 MED ORDER — NITROGLYCERIN IN D5W 200-5 MCG/ML-% IV SOLN
0.0000 ug/min | INTRAVENOUS | Status: DC
  Administered 2018-02-17: 5 ug/min via INTRAVENOUS
  Filled 2018-02-17: qty 250

## 2018-02-17 MED ORDER — ACETAMINOPHEN 325 MG PO TABS
650.0000 mg | ORAL_TABLET | ORAL | Status: DC | PRN
  Administered 2018-02-17 – 2018-02-18 (×2): 650 mg via ORAL
  Filled 2018-02-17 (×2): qty 2

## 2018-02-17 MED ORDER — HEPARIN (PORCINE) IN NACL 100-0.45 UNIT/ML-% IJ SOLN
650.0000 [IU]/h | INTRAMUSCULAR | Status: DC
  Administered 2018-02-17: 650 [IU]/h via INTRAVENOUS
  Filled 2018-02-17: qty 250

## 2018-02-17 MED ORDER — METOCLOPRAMIDE HCL 10 MG PO TABS
10.0000 mg | ORAL_TABLET | Freq: Every day | ORAL | Status: DC
  Administered 2018-02-17 – 2018-02-18 (×2): 10 mg via ORAL
  Filled 2018-02-17 (×2): qty 1

## 2018-02-17 MED ORDER — INSULIN NPH (HUMAN) (ISOPHANE) 100 UNIT/ML ~~LOC~~ SUSP
10.0000 [IU] | Freq: Once | SUBCUTANEOUS | Status: AC
  Administered 2018-02-17: 10 [IU] via SUBCUTANEOUS
  Filled 2018-02-17: qty 10

## 2018-02-17 MED ORDER — ROSUVASTATIN CALCIUM 20 MG PO TABS
40.0000 mg | ORAL_TABLET | Freq: Every day | ORAL | Status: DC
  Administered 2018-02-17 – 2018-02-18 (×2): 40 mg via ORAL
  Filled 2018-02-17: qty 1
  Filled 2018-02-17: qty 2

## 2018-02-17 MED ORDER — SODIUM CHLORIDE 0.9 % IV SOLN
INTRAVENOUS | Status: DC | PRN
  Administered 2018-02-17: 19:00:00 via INTRAVENOUS

## 2018-02-17 MED ORDER — ASPIRIN EC 81 MG PO TBEC
81.0000 mg | DELAYED_RELEASE_TABLET | Freq: Every day | ORAL | Status: DC
  Administered 2018-02-17 – 2018-02-19 (×3): 81 mg via ORAL
  Filled 2018-02-17 (×3): qty 1

## 2018-02-17 MED ORDER — ONDANSETRON HCL 4 MG/2ML IJ SOLN
4.0000 mg | Freq: Four times a day (QID) | INTRAMUSCULAR | Status: DC | PRN
  Administered 2018-02-17: 4 mg via INTRAVENOUS
  Filled 2018-02-17: qty 2

## 2018-02-17 MED ORDER — SODIUM CHLORIDE 0.9 % WEIGHT BASED INFUSION
1.0000 mL/kg/h | INTRAVENOUS | Status: DC
  Administered 2018-02-18: 1 mL/kg/h via INTRAVENOUS

## 2018-02-17 NOTE — Progress Notes (Signed)
Patient complain of CP to RN around 1600 rating pain at a 7 on a 0-10 pain scale, 2 nitro given and cp resolved, MD paged about pt pain, ordered nitro drip, RN inform MD that per unit policy we couldn't start drip here, MD ordered transfer for step down unit, in the process of getting bed for transfer, patient started having CP again around 1800 rating 10/10, nitro tab had been d/c by MD so RN gave pt morphine and transfer patient to stepdown for nitro drip.

## 2018-02-17 NOTE — ED Triage Notes (Addendum)
[facility]- pt coming from work with c/o substernal chest pressure radiating to left arm. Also c/o SOB. No cardiac hx. Hx T1 diabetes. CBG was 431. Pt has had 324 ASA, 3 nitro. 20g LFA.

## 2018-02-17 NOTE — Consult Note (Addendum)
Cardiology Consult    Patient ID: Karen Hayden MRN: [id number], DOB/AGE: 1976/08/09   Admit date: 02/17/2018 Date of Consult: 02/17/2018  Primary Physician: Jacelyn Pi, MD Primary Cardiologist: New (Dr. Meda Coffee) Requesting Provider: Dr. Lorin Mercy Reason for Consultation: Chest pain  Karen Hayden is a 42 y.o. female who is being seen today for the evaluation of chest pain at the request of Dr. Lorin Mercy.  Patient Profile    42 year old female with past medical history of hypertension, hypothyroidism, insulin-dependent diabetes, and tobacco use who presented with chest pain.  Past Medical History   Past Medical History:  Diagnosis Date  . Bipolar 1 disorder ([place])   . Chronic kidney infection   . Diabetic gastroparesis ([place])   . DM (diabetes mellitus) ([place])   . Gastroparesis    Due to diabetes   . Hyperlipidemia   . Hypertension   . Hypothyroidism    Has thyroiditis with subsequent hyperthyroidism so off Synthroid  . Seizure disorder ([place])   . Umbilical hernia     Past Surgical History:  Procedure Laterality Date  . TONSILLECTOMY    . VAGINAL HYSTERECTOMY     left ovary removed , has her right ovary     Allergies  Allergies  Allergen Reactions  . Lipitor [Atorvastatin]     Attacks joints, inflamation  . Codeine Rash    History of Present Illness    Karen Hayden is a 42 year old female with past medical history of hypertension, hypothyroidism, insulin-dependent diabetes, and tobacco use.  Reports she has been followed by PCP for her diabetes for many years.  Was first diagnosed in 2003.  States she has struggled to have good control over her blood sugars.  She is also used tobacco over the past 20 years.  Does report a strong family history with her mother having an MI at 73 and subsequent CABG, and passed at age 76.  Uncle also with MI and CABG in his 64s.  Grandmother passed of heart attack in her early 64s as well.  Also has a history of hyperlipidemia, but has not been  able to be on statins secondary to myalgias.  She does report marijuana use, but denies any cocaine.  Currently works as a Emergency planning/management officer.  States over the past couple of days she has developed centralized chest pain, that does radiate down her left arm along with nausea, diaphoresis and palpitations.  States it has been intermittent at least for the past 2 to 3 days.  This morning when she woke up her symptoms were present again, but she went to work.  When she got to work her chest pain worsened, she felt dizzy and lightheaded, and also became diaphoretic.  Sat down on the floor at work, and a coworker called 911.  She was given 324 of aspirin and sublingual nitro with resolution of her pain prior to presenting to the ER.  Had a subsequent episode of chest pain in the ER was sublingual nitro given and resolution of symptoms.   In the ED her labs showed stable electrolytes POC troponin negative, delta troponin negative, hemoglobin 13.9, hemoglobin A1c 7.7, TSH 0.141, and T4 0.70.  Chest x-ray was negative for edema.  EKG showed sinus rhythm with no acute ST/T wave abnormalities.  She was admitted to internal medicine for further work-up.  Inpatient Medications    . aspirin EC  81 mg Oral Daily  . enoxaparin (LOVENOX) injection  40 mg Subcutaneous Q24H  . gabapentin  300  mg Oral TID  . insulin aspart  0-5 Units Subcutaneous QHS  . insulin aspart  0-9 Units Subcutaneous TID WC  . insulin aspart  5 Units Subcutaneous Once  . insulin NPH Human  10 Units Subcutaneous BID AC & HS  . insulin NPH Human  10 Units Subcutaneous Once  . metoCLOPramide  10 mg Oral QHS  . [START ON 02/18/2018] pneumococcal 23 valent vaccine  0.5 mL Intramuscular Tomorrow-1000    Family History    Family History  Problem Relation Age of Onset  . Diabetes Mother   . Heart attack Mother   . Ovarian cancer Paternal Grandmother        great grandmother  . Colon cancer Neg Hx     Social History    Social History     Socioeconomic History  . Marital status: Legally Separated    Spouse name: Not on file  . Number of children: 2  . Years of education: Not on file  . Highest education level: Not on file  Occupational History  . 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 Financial resource strain: Not on file  . Food insecurity:    Worry: Not on file    Inability: Not on file  . Transportation needs:    Medical: Not on file    Non-medical: Not on file  Tobacco Use  . Smoking status: Current Every Day Smoker    Packs/day: 1.00    Years: 13.00    Pack years: 13.00    Types: Cigarettes  . Smokeless tobacco: Never Used  . Tobacco comment: Tobacco info given  Substance and Sexual Activity  . Alcohol use: Yes    Comment: occasional  . 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 Sexual activity: Yes    Birth control/protection: Surgical  Lifestyle  . Physical activity:    Days per week: Not on file    Minutes per session: Not on file  . Stress: Not on file  Relationships  . Social connections:    Talks on phone: Not on file    Gets together: Not on file    Attends religious service: Not on file    Active member of club or organization: Not on file    Attends meetings of clubs or organizations: Not on file    Relationship status: Not on file  . Intimate partner violence:    Fear of current or ex partner: Not on file    Emotionally abused: Not on file    Physically abused: Not on file    Forced sexual activity: Not on file  Other Topics Concern  . Not on file  Social History Narrative  . Not on file     Review of Systems    See HPI  All other systems reviewed and are otherwise negative except as noted above.  Physical Exam    Blood pressure 121/74, pulse 65, temperature (!) 97.5 F (36.4 C), temperature source Oral, resp. rate 18, height 5\' 1"  (1.549 m), weight 118 lb 1.6 oz (53.6 kg), SpO2 100 %.  General:  Pleasant, appears older than stated age WF, NAD Psych: Normal affect. Neuro: Alert and oriented X 3. Moves all extremities spontaneously. HEENT: Normal  Neck: Supple without bruits or JVD. Lungs:  Resp regular and unlabored, CTA. Heart: RRR no s3, s4, or murmurs. Abdomen: Soft, non-tender, non-distended, BS +  x 4.  Extremities: No clubbing, cyanosis or edema. DP/PT/Radials 2+ and equal bilaterally. Numerous tattoos noted.   Labs    Troponin [facility] Test) Recent Labs    02/17/18 0843  TROPIPOC 0.00   Recent Labs    02/17/18 1203  TROPONINI <0.03   Lab Results  Component Value Date   WBC 9.0 02/17/2018   HGB 13.9 02/17/2018   HCT 42.9 02/17/2018   MCV 85.3 02/17/2018   PLT 350 02/17/2018    Recent Labs  Lab 02/17/18 0859  NA 137  K 4.8  CL 107  CO2 20*  BUN 10  CREATININE 0.86  CALCIUM 9.5  GLUCOSE 234*   No results found for: CHOL, HDL, LDLCALC, TRIG Lab Results  Component Value Date   DDIMER <0.27 04/18/2017     Radiology Studies    Dg Chest Port 1 View  Result Date: 02/17/2018 CLINICAL DATA:  Chest pain and shortness of breath.  Tobacco use. EXAM: PORTABLE CHEST 1 VIEW COMPARISON:  May 07, 2017 FINDINGS: Lungs are mildly hyperexpanded. There is no edema or consolidation. Heart size and pulmonary vascularity are normal. No adenopathy. No bone lesions. IMPRESSION: Lungs mildly hyperexpanded.  No edema or consolidation. Electronically Signed   By: Lowella Grip III M.D.   On: 02/17/2018 08:57    ECG & Cardiac Imaging    EKG:  The EKG was personally reviewed and demonstrates SR  Assessment & Plan    42 year old female with past medical history of hypertension, hypothyroidism, insulin-dependent diabetes, and tobacco use who presented with chest pain.  1. Unstable Angina: She reports several days of intermittent chest pain with shortness of breath, nausea, and diaphoresis. Symptoms seem to be getting worse. Had another episode while at work  today and 911 was called. Chest pain is responsive to SL nitro. Trop neg x2. EKG nonischemic. Admitted to IM and brought to the floor. Had another episode of chest pain about 20 minutes prior to exam that improved with 2 SL nitro. Comfortable at the time of exam. CRFs with tobacco use, IDDM, and strong family hx of CAD.  -- start IV heparin -- will need cardiac cath, add nitro paste for now. Plan for cath in the morning. NPO at midnight. The patient understands that risks included but are not limited to stroke (1 in 1000), death (1 in 59), kidney failure [usually temporary] (1 in 500), bleeding (1 in 200), allergic reaction [possibly serious] (1 in 200).  -- cycle troponins -- check echo  2. IDDM: Hgb A1c is 7.7 on admission. Reports trouble controlling her blood sugars in general. Needs tighter glycemic control.   3. HL: reports having been on atorvastatin in the past but unable to tolerate 2/2 to myalgias.  -- check lipids -- add Crestor 20mg  daily   4. Tobacco use: has been smoking for the past 20 years. Cessation advised.   Barnet Pall, NP-C Pager [phone] 02/17/2018, 4:14 PM   The patient was seen, examined and discussed with Reino Bellis, NP-C and I agree with the above.   Very pleasant 42 year old, older appearing female who presented with typical retrosternal chest pressure associated with diaphoresis, shortness of breath and radiation and numbness in her left arm.  The pain was on and off but escalating, she called EMS, received nitroglycerin that helped significantly however her pain recurred again relieved by nitroglycerin currently still feeling short of breath and sweating. She has very strong family history of coronary artery disease, her mother had myocardial  infarction at age of 25, grandfather age 59, completely unrelated to coronary artery disease however her daughter had influenza induced cardiomyopathy in February 2018 with acute cardiogenic and  respiratory failure, on ECMO and ultimately ended with heart and lung transplant.  The patient is a lifelong smoker, she also has poorly controlled type 1 diabetes for over 20 years now.  , This presentation is highly suspicious for unstable angina, will start heparin drip, nitroglycerin drip, her EKG is currently normal and her troponin is negative, she has recurrent chest pain we will catheter earlier tonight, otherwise will wait for the morning.  We will keep her n.p.o., she might need to be started on dextrose drip given her type 1 diabetes.  We will start on rosuvastatin 40 mg po daily as she is allergic to atorvastatin, add aspirin 81 mg daily, she is already bradycardic so no beta-blockers but we will start lisinopril for hypertension.  Ena Dawley, MD. 02/17/2018   Ena Dawley, MD 02/17/2018

## 2018-02-17 NOTE — H&P (Signed)
History and Physical    Karen Hayden:[id number] DOB: Mar 14, 1976 DOA: 02/17/2018  PCP: Jacelyn Pi, MD Consultants:  [facility] - GI Patient coming from:  Home - lives alone; NOK: Daughter, [place], [phone]  Chief Complaint:  Chest pain  HPI: Karen Hayden is a 42 y.o. female with medical history significant of seizure D/O; HTN; HLD; DM; and bipolar d/o presenting with chest pain.  The last few days, she hasn't been feeling well. She has noticed swelling in her feet, legs, ankles.  This AM, her glucose was 418.  She noticed a sensation of an elephant sitting on her chest.  Associated with nausea, SOB, light headed, sweaty, and tingling in her left shoulder and upper arm.  Symptoms lasted until NTG given by EMS.  She took 3 on the way to the ER with resolution of pain.  About 20 minutes ago in the ER, she needed another one and immediately the sensation resolved.  She has never had anything similar  Mother with MI at 34 and subsequent CABG, died at 30.  Uncle with MI and CABG at 61.  MGF died of heart attack in early 75s.     ED Course:  Chest pain, needs a diagnostic test.  Pain improved with NTG x 2.  No h/o stress testing in the past.    Review of Systems: As per HPI; otherwise review of systems reviewed and negative.   Ambulatory Status:  Ambulates without assistance  Past Medical History:  Diagnosis Date  . Bipolar 1 disorder ([place])   . Chronic kidney infection   . Diabetic gastroparesis ([place])   . DM (diabetes mellitus) ([place])   . Gastroparesis    Due to diabetes   . Hyperlipidemia   . Hypertension   . Hypothyroidism    Has thyroiditis with subsequent hyperthyroidism so off Synthroid  . Seizure disorder ([place])   . Umbilical hernia     Past Surgical History:  Procedure Laterality Date  . TONSILLECTOMY    . VAGINAL HYSTERECTOMY     left ovary removed , has her right ovary    Social History   Socioeconomic History  . Marital status: Legally Separated    Spouse name:  Not on file  . Number of children: 2  . Years of education: Not on file  . Highest education level: Not on file  Occupational History  . 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 Financial resource strain: Not on file  . Food insecurity:    Worry: Not on file    Inability: Not on file  . Transportation needs:    Medical: Not on file    Non-medical: Not on file  Tobacco Use  . Smoking status: Current Every Day Smoker    Packs/day: 1.00    Years: 13.00    Pack years: 13.00    Types: Cigarettes  . Smokeless tobacco: Never Used  . Tobacco comment: Tobacco info given  Substance and Sexual Activity  . Alcohol use: Yes    Comment: occasional  . 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 Sexual activity: Yes    Birth control/protection: Surgical  Lifestyle  . Physical activity:    Days per week: Not on file    Minutes per session: Not on file  . Stress: Not on file  Relationships  . Social connections:    Talks on phone: Not  on file    Gets together: Not on file    Attends religious service: Not on file    Active member of club or organization: Not on file    Attends meetings of clubs or organizations: Not on file    Relationship status: Not on file  . Intimate partner violence:    Fear of current or ex partner: Not on file    Emotionally abused: Not on file    Physically abused: Not on file    Forced sexual activity: Not on file  Other Topics Concern  . Not on file  Social History Narrative  . Not on file    Allergies  Allergen Reactions  . Lipitor [Atorvastatin]     Attacks joints, inflamation  . Codeine Rash    Family History  Problem Relation Age of Onset  . Diabetes Mother   . Heart attack Mother   . Ovarian cancer Paternal Grandmother        great grandmother  . Colon cancer Neg Hx     Prior to Admission medications   Medication Sig Start Date End Date Taking? Authorizing Provider    gabapentin (NEURONTIN) 300 MG capsule Take 300 mg by mouth 3 (three) times daily.   Yes [provider]  ibuprofen (ADVIL,MOTRIN) 200 MG tablet Take 800 mg by mouth as needed for moderate pain.   Yes [provider]  insulin lispro (HUMALOG) 100 UNIT/ML cartridge Inject into the skin 4 (four) times daily. 1 unit per 10gm of Carbs. Counts carbs with her meals to get dosage   Yes [provider]  Insulin NPH Human, Isophane, (HUMULIN N PEN [place]) Inject 10 Units into the skin 2 (two) times daily.   Yes [provider]  metoCLOPramide (REGLAN) 10 MG tablet Take 1 tab at bedtime. Patient taking differently: Take 10 mg by mouth at bedtime. Take 1 tab at bedtime. 06/08/17  Yes Esterwood, Amy S, PA-C  Multiple Vitamin (MULTIVITAMIN WITH MINERALS) TABS tablet Take 1 tablet by mouth daily.   Yes [provider]  promethazine (PHENERGAN) 25 MG tablet Take 1 tab by mouth every 6-8 hours as needed for nausea & vomiting. 06/08/17  Yes Esterwood, Amy S, PA-C  INSULIN SYRINGE .5CC/28G (INS SYRINGE/NEEDLE .5CC/28G) 28G X 1/2" 0.5 ML MISC Use as instructed to administer insulin 05/10/17   Bonnielee Haff, MD    Physical Exam: Vitals:   02/17/18 0945 02/17/18 1015 02/17/18 1030 02/17/18 1047  BP: 131/73 124/74 127/68 118/72  Pulse: (!) 53 (!) 45 (!) 52 68  Resp: 13 12 12 13   Temp:      TempSrc:      SpO2: 100% 100% 100% 100%     General:  Appears calm and comfortable and is NAD; appears older than stated age Eyes:  PERRL, EOMI, normal lids, iris ENT:  grossly normal hearing, lips & tongue, mmm Neck:  no LAD, masses or thyromegaly; no carotid bruits Cardiovascular:  RRR, no m/r/g. No LE edema.  Respiratory:   CTA bilaterally with no wheezes/rales/rhonchi.  Normal respiratory effort. Abdomen:  soft, NT, ND, NABS Back:   normal alignment, no CVAT Skin:  no rash or induration seen on limited exam Musculoskeletal:  grossly normal tone BUE/BLE, good ROM, no bony  abnormality Lower extremity:  No LE edema.  Limited foot exam with no ulcerations.  2+ distal pulses. Psychiatric:  grossly normal mood and affect, speech fluent and appropriate, AOx3 Neurologic:  CN 2-12 grossly intact, moves all extremities in coordinated  fashion, sensation intact    Radiological Exams on Admission: Dg Chest Port 1 View  Result Date: 02/17/2018 CLINICAL DATA:  Chest pain and shortness of breath.  Tobacco use. EXAM: PORTABLE CHEST 1 VIEW COMPARISON:  May 07, 2017 FINDINGS: Lungs are mildly hyperexpanded. There is no edema or consolidation. Heart size and pulmonary vascularity are normal. No adenopathy. No bone lesions. IMPRESSION: Lungs mildly hyperexpanded.  No edema or consolidation. Electronically Signed   By: Lowella Grip III M.D.   On: 02/17/2018 08:57    EKG: Independently reviewed.  NSR with rate 65, 68; nonspecific ST changes with no evidence of acute ischemia; NSCSLT   Labs on Admission: I have personally reviewed the available labs and imaging studies at the time of the admission.  Pertinent labs:   VBG: 7.479/31.7/193 CO2 20 Glucose 234 Normal CBC Troponin 0.00 TSH 0.141  Assessment/Plan Principal Problem:   Chest pain Active Problems:   Type 1 diabetes mellitus (HCC)   Diabetic gastroparesis (HCC)   Tobacco use disorder   Marijuana dependence ([place])   Chest pain -Patient with substernal chest pressure that came on acutely this AM x 2 and resolved with NTG -2/3 typical symptoms suggestive of atypical cardiac chest pain.  -CXR unremarkable.   -Initial cardiac troponin negative.  -EKG not indicative of acute ischemia.   -GRACE score is 78; which predicts an in-hospital death rate of 0.4%.  -Will plan to place in observation status on telemetry to rule out ACS by overnight observation.  -cycle troponin q6h x 3 and repeat EKG in AM -Start ASA 81 mg  daily -morphine given -Risk factor stratification with HgbA1c and FLP; will also check  TSH (slightly low, add T4) and UDS -Cardiology consultation in AM - NPO for possible stress test  -Will plan to start Heparin drip if enzymes are positive   DM -Reports glucose from 30-400 at home, appears to have suboptimal control -Uses NPH 10 units BID, will continue -Will cover with sensitive-scale SSI -Continue Neurontin for neuropathy and Reglan for gastroparesis  Marijuana dependence -Cessation encouraged; this should be encouraged on an ongoing basis -UDS ordered  Tobacco dependence -Encourage cessation.  This was discussed with the patient and should be reviewed on an ongoing basis.   -Patch declined by patient.  DVT prophylaxis:  Lovenox  Code Status:  Full - confirmed with patient/family Family Communication: Father present for majority of evaluation  Disposition Plan:  Home once clinically improved Consults called: Cardiology - for possible stress test in AM; CardsMaster message sent  Admission status: It is my clinical opinion that referral for OBSERVATION is reasonable and necessary in this patient based on the above information provided. The aforementioned taken together are felt to place the patient at high risk for further clinical deterioration. However it is anticipated that the patient may be medically stable for discharge from the hospital within 24 to 48 hours.     Karmen Bongo MD [facility]  If note is complete, please contact covering daytime or nighttime physician. [email or web address] Password [facility]  02/17/2018, 11:56 AM

## 2018-02-17 NOTE — ED Notes (Signed)
Called into room due to patient having 8/10 chest pain again at this time. Pt tearful and reports SOB. Administered 1 nitro which relieved her pain. MD aware, repeat EKG obtained.

## 2018-02-17 NOTE — ED Provider Notes (Signed)
[facility] EMERGENCY DEPARTMENT Provider Note   CSN: [id number] Arrival date & time: 02/17/18  9323     History   Chief Complaint No chief complaint on file.   HPI Karen Hayden is a 42 y.o. female.  42 year old female with no prior cardiac history but multiple risk factors including diabetes hypertension hyperlipidemia smoking and strong family history presents with substernal chest pressure radiating to the left shoulder 10 out of 10 intensity that started around 6 AM when she woke up this morning.  It was associated with generalized weakness and diaphoresis.  She also had an elevated blood sugar this morning and she states her sugars range between 30 and 400 on a routine basis.  She smokes about a pack of cigarettes a day.  She also does use marijuana but she denies any cocaine.  When EMS arrived they gave her aspirin and nitro which improved the pain from 10 to 2 out of 10  The history is provided by the patient.  Chest Pain   This is a new problem. The current episode started 3 to 5 hours ago. The problem occurs constantly. The problem has been rapidly improving. The pain is associated with rest. The pain is present in the substernal region. The pain is at a severity of 10/10. The quality of the pain is described as pressure-like. The pain radiates to the left shoulder and left arm. Associated symptoms include diaphoresis, malaise/fatigue, shortness of breath and weakness. Pertinent negatives include no abdominal pain, no back pain, no cough, no fever, no headaches, no hemoptysis, no leg pain, no lower extremity edema, no nausea, no palpitations, no sputum production, no syncope and no vomiting. She has tried nitroglycerin for the symptoms. The treatment provided moderate relief. Risk factors include smoking/tobacco exposure.  Her past medical history is significant for diabetes, hyperlipidemia and hypertension.  Her family medical history is significant for heart disease.     Past Medical History:  Diagnosis Date  . Bipolar 1 disorder ([place])   . Chronic kidney infection   . Diabetic gastroparesis ([place])   . DM (diabetes mellitus) ([place])   . Gastroparesis    Due to diabetes   . Hyperlipidemia   . Hypertension   . Seizure disorder ([place])   . Thyroid disease   . Umbilical hernia     Patient Active Problem List   Diagnosis Date Noted  . Diabetic ketoacidosis without coma associated with type 1 diabetes mellitus ([place])   . Protein-calorie malnutrition, severe ([place]) 01/29/2014  . Nausea & vomiting 01/26/2014  . Unspecified constipation 01/26/2014  . Cough 01/26/2014  . Tobacco use disorder 01/26/2014  . Left sided abdominal pain 06/21/2013  . Gastroparesis 06/21/2013  . Nausea with vomiting 06/21/2013  . Other general symptoms(780.99)  06/21/2013  . Change in bowel habits 06/21/2013  . Diabetic gastroparesis ([place]) 06/04/2011  . Type 1 diabetes mellitus ([place]) 01/04/2009  . Acute bronchitis 01/04/2009  . SEIZURE DISORDER 01/04/2009    Past Surgical History:  Procedure Laterality Date  . TONSILLECTOMY    . VAGINAL HYSTERECTOMY     left ovary removed , has her right ovary     OB History    Gravida  2   Para      Term      Preterm      AB      Living  2     SAB  0   TAB  0   Ectopic  Multiple      Live Births               Home Medications    Prior to Admission medications   Medication Sig Start Date End Date Taking? Authorizing Provider  gabapentin (NEURONTIN) 300 MG capsule Take 300 mg by mouth 3 (three) times daily.    [provider]  Insulin Lispro (HUMALOG [place]) Inject into the skin. 12 units in the AM and 14 units in the PM    [provider]  insulin lispro (HUMALOG) 100 UNIT/ML cartridge Inject into the skin 4 (four) times daily. Carb counts with her meals to get dosage    [provider]  INSULIN SYRINGE .5CC/28G (INS SYRINGE/NEEDLE .5CC/28G) 28G X 1/2" 0.5 ML MISC Use as instructed  to administer insulin 05/10/17   Bonnielee Haff, MD  levothyroxine (SYNTHROID, LEVOTHROID) 100 MCG tablet Take 100 mcg by mouth daily.    [provider]  metoCLOPramide (REGLAN) 10 MG tablet Take 1 tab at bedtime. 06/08/17   Esterwood, Amy S, PA-C  Multiple Vitamin (MULTIVITAMIN WITH MINERALS) TABS tablet Take 1 tablet by mouth daily.    [provider]  promethazine (PHENERGAN) 25 MG tablet Take 1 tab by mouth every 6-8 hours as needed for nausea & vomiting. 06/08/17   Esterwood, Amy S, PA-C  trimethoprim (TRIMPEX) 100 MG tablet Take 100 mg by mouth daily. Long term med    [provider]    Family History Family History  Problem Relation Age of Onset  . Diabetes Mother   . Heart attack Mother   . Ovarian cancer Paternal Grandmother        great grandmother  . Colon cancer Neg Hx     Social History Social History   Tobacco Use  . Smoking status: Current Every Day Smoker    Packs/day: 0.50    Years: 13.00    Pack years: 6.50    Types: Cigarettes  . Smokeless tobacco: Never Used  . Tobacco comment: Tobacco info given  Substance Use Topics  . Alcohol use: No  . Drug use: Yes    Types: Marijuana     Allergies   Lipitor [atorvastatin] and Codeine   Review of Systems Review of Systems  Constitutional: Positive for diaphoresis and malaise/fatigue. Negative for chills and fever.  HENT: Negative for ear pain and sore throat.   Eyes: Negative for pain and visual disturbance.  Respiratory: Positive for shortness of breath. Negative for cough, hemoptysis and sputum production.   Cardiovascular: Positive for chest pain. Negative for palpitations and syncope.  Gastrointestinal: Negative for abdominal pain, nausea and vomiting.  Genitourinary: Negative for dysuria and hematuria.  Musculoskeletal: Positive for arthralgias. Negative for back pain.  Skin: Negative for color change and rash.  Neurological: Positive for weakness. Negative for syncope and  headaches.  All other systems reviewed and are negative.    Physical Exam Updated Vital Signs BP 102/70 (BP Location: Right Arm)   Pulse 67   Temp 97.7 F (36.5 C) (Oral)   Resp 12   SpO2 98%   Physical Exam  Constitutional: She appears well-developed and well-nourished. No distress.  HENT:  Head: Normocephalic and atraumatic.  Right Ear: External ear normal.  Left Ear: External ear normal.  Nose: Nose normal.  Mouth/Throat: Oropharynx is clear and moist.  Eyes: Pupils are equal, round, and reactive to light. Conjunctivae and EOM are normal.  Neck: Normal range of motion. Neck supple. No tracheal deviation present.  Cardiovascular:  Normal rate, regular rhythm, normal heart sounds and intact distal pulses.  No murmur heard. Pulmonary/Chest: Effort normal and breath sounds normal. No respiratory distress.  Abdominal: Soft. There is no tenderness.  Musculoskeletal: Normal range of motion. She exhibits no edema, tenderness or deformity.  Neurological: She is alert.  Skin: Skin is warm and dry. Capillary refill takes less than 2 seconds.  Psychiatric: She has a normal mood and affect.  Nursing note and vitals reviewed.    ED Treatments / Results  Labs (all labs ordered are listed, but only abnormal results are displayed) Labs Reviewed  BASIC METABOLIC PANEL - Abnormal; Notable for the following components:      Result Value   CO2 20 (*)    Glucose, Bld 234 (*)    All other components within normal limits  TSH - Abnormal; Notable for the following components:   TSH 0.141 (*)    All other components within normal limits  HEMOGLOBIN A1C - Abnormal; Notable for the following components:   Hgb A1c MFr Bld 7.7 (*)    All other components within normal limits  T4, FREE - Abnormal; Notable for the following components:   Free T4 0.70 (*)    All other components within normal limits  GLUCOSE, CAPILLARY - Abnormal; Notable for the following components:   Glucose-Capillary 296  (*)    All other components within normal limits  LIPID PANEL - Abnormal; Notable for the following components:   HDL 38 (*)    All other components within normal limits  GLUCOSE, CAPILLARY - Abnormal; Notable for the following components:   Glucose-Capillary 111 (*)    All other components within normal limits  GLUCOSE, CAPILLARY - Abnormal; Notable for the following components:   Glucose-Capillary 61 (*)    All other components within normal limits  GLUCOSE, CAPILLARY - Abnormal; Notable for the following components:   Glucose-Capillary 116 (*)    All other components within normal limits  GLUCOSE, CAPILLARY - Abnormal; Notable for the following components:   Glucose-Capillary 60 (*)    All other components within normal limits  GLUCOSE, CAPILLARY - Abnormal; Notable for the following components:   Glucose-Capillary 153 (*)    All other components within normal limits  GLUCOSE, CAPILLARY - Abnormal; Notable for the following components:   Glucose-Capillary 61 (*)    All other components within normal limits  I-STAT VENOUS BLOOD GAS, ED - Abnormal; Notable for the following components:   pH, Ven 7.479 (*)    pCO2, Ven 31.7 (*)    pO2, Ven 193.0 (*)    All other components within normal limits  MRSA PCR SCREENING  CBC  HIV ANTIBODY (ROUTINE TESTING)  TROPONIN I  TROPONIN I  TROPONIN I  GLUCOSE, CAPILLARY  PROTIME-INR  GLUCOSE, CAPILLARY  I-STAT TROPONIN, ED  CBG MONITORING, ED    EKG EKG Interpretation  Date/Time:  Thursday February 17 2018 10:40:30 EDT Ventricular Rate:  68 PR Interval:    QRS Duration: 93 QT Interval:  429 QTC Calculation: 457 R Axis:   91 Text Interpretation:  Sinus rhythm Prolonged PR interval Lateral infarct, old unchanged from prior  Confirmed by Aletta Edouard [phone]) on 02/17/2018 10:49:45 AM   Radiology Dg Chest Port 1 View  Result Date: 02/17/2018 CLINICAL DATA:  Chest pain and shortness of breath.  Tobacco use. EXAM: PORTABLE CHEST 1  VIEW COMPARISON:  May 07, 2017 FINDINGS: Lungs are mildly hyperexpanded. There is no edema or consolidation. Heart size and pulmonary vascularity  are normal. No adenopathy. No bone lesions. IMPRESSION: Lungs mildly hyperexpanded.  No edema or consolidation. Electronically Signed   By: Lowella Grip III M.D.   On: 02/17/2018 08:57    Procedures Procedures (including critical care time)  Medications Ordered in ED Medications  nitroGLYCERIN (NITROSTAT) SL tablet 0.4 mg (has no administration in time range)  sodium chloride 0.9 % bolus 1,000 mL (has no administration in time range)  morphine 4 MG/ML injection 4 mg (has no administration in time range)     Initial Impression / Assessment and Plan / ED Course  I have reviewed the triage vital signs and the nursing notes.  Pertinent labs & imaging results that were available during my care of the patient were reviewed by me and considered in my medical decision making (see chart for details).  Clinical Course as of Feb 19 800  Thu Feb 17, 2018  1048 Patient had a recurrence again of chest pain 8 out of 10 intensity which improved with one nitro.  Her EKG is unchanged.  She should probably be admitted for cardiac testing.   [MB]  1126 Discussed with Dr. Lorin Mercy from the hospitalist service will admit to her service.   [MB]    Clinical Course User Index [MB] Hayden Rasmussen, MD     Final Clinical Impressions(s) / ED Diagnoses   Final diagnoses:  Unstable angina [facility])    ED Discharge Orders    None       Hayden Rasmussen, MD 02/18/18 [id number]

## 2018-02-17 NOTE — Progress Notes (Addendum)
Inpatient Diabetes Program Recommendations  AACE/ADA: New Consensus Statement on Inpatient Glycemic Control (2015)  Target Ranges:  Prepandial:   less than 140 mg/dL      Peak postprandial:   less than 180 mg/dL (1-2 hours)      Critically ill patients:  140 - 180 mg/dL   Lab Results  Component Value Date   GLUCAP 77 02/17/2018   HGBA1C 7.7 (H) 02/17/2018    Review of Glycemic Control Results for Karen Hayden, Karen Hayden (MRN [id number]) as of 02/17/2018 15:31  Ref. Range 02/17/2018 12:11  Glucose-Capillary Latest Ref Range: 65 - 99 mg/dL 77   Diabetes history: Type 1 DM Outpatient Diabetes medications: NPH 10 units QAM, QPM, Humulin Regular covering 1 unit per 10 gm CHO Current orders for Inpatient glycemic control: Novolog 0-9 units TId, Novolog 0-5 units QHS, NPH 10 units QAM, QPM  Inpatient Diabetes Program Recommendations:    Spoke with patient briefly regarding outpatient regimen. Patient is tearful and says that she is frustrated with her DM. It is not well controlled. Has multiple hypoglycemic events per week. She reports compliance with her regimen. Patient has established care with Dr Suzette Battiest and is scheduled for follow up next month.   Patient has a Dexcom and the reading at the bedside is 400 mg/dL. Patient has not had NPH insulin since 0600 am and is a type 1 DM. This lasts for 6-8 hours. Recommend administering dose of NPH 10 units now and give Novolog 5 units x1. Per patient 1 unit of insulin drops patient 50-70 pts., at high risk for lows while inpatient. Dr Lorin Mercy called, orders placed.  Reviewed patient's current A1c of 7.7%. Explained what a A1c is and what it measures. Also reviewed goal A1c with patient, importance of good glucose control @ home, and blood sugar goals. Will continue to follow patient.   Spoke with RN regarding Dexcom reading. Encouraged to get another BS on patient and to expect additional orders.  Thanks, Bronson Curb, MSN, RNC-OB Diabetes  Coordinator [phone] (8a-5p)

## 2018-02-17 NOTE — ED Notes (Signed)
Heart Healthy Lunch Tray Ordered @ 1215-per RN-called by Levada Dy

## 2018-02-17 NOTE — Progress Notes (Signed)
ANTICOAGULATION CONSULT NOTE - Initial Consult  Pharmacy Consult for Heparin Indication: chest pain/ACS  Allergies  Allergen Reactions  . Lipitor [Atorvastatin]     Attacks joints, inflamation  . Codeine Rash    Patient Measurements: Height: 5\' 1"  (154.9 cm) Weight: 118 lb 1.6 oz (53.6 kg) IBW/kg (Calculated) : 47.8  Vital Signs: Temp: 97.5 F (36.4 C) (06/20 1256) Temp Source: Oral (06/20 1256) BP: 121/74 (06/20 1608) Pulse Rate: 65 (06/20 1608)  Labs: Recent Labs    02/17/18 0859 02/17/18 1203  HGB 13.9  --   HCT 42.9  --   PLT 350  --   CREATININE 0.86  --   TROPONINI  --  <0.03    Estimated Creatinine Clearance: 64.3 mL/min (by C-G formula based on SCr of 0.86 mg/dL).   Medical History: Past Medical History:  Diagnosis Date  . Bipolar 1 disorder ([place])   . Chronic kidney infection   . Diabetic gastroparesis ([place])   . DM (diabetes mellitus) ([place])   . Gastroparesis    Due to diabetes   . Hyperlipidemia   . Hypertension   . Hypothyroidism    Has thyroiditis with subsequent hyperthyroidism so off Synthroid  . Seizure disorder ([place])   . Umbilical hernia     Assessment: 42 yo female admitted with chest pain. Will start heparin drip  Goal of Therapy:  Heparin level 0.3-0.7 units/ml Monitor platelets by anticoagulation protocol: Yes   Plan:  Give 3200 units bolus x 1 Start heparin infusion at 650 units/hr Check anti-Xa level in 6 hours and daily while on heparin Continue to monitor H&H and platelets  Corinda Gubler, PharmD, FCCM 02/17/2018,5:04 PM

## 2018-02-18 ENCOUNTER — Encounter (HOSPITAL_COMMUNITY): Payer: Self-pay | Admitting: Interventional Cardiology

## 2018-02-18 ENCOUNTER — Observation Stay (HOSPITAL_BASED_OUTPATIENT_CLINIC_OR_DEPARTMENT_OTHER): Payer: Medicare Other

## 2018-02-18 ENCOUNTER — Encounter (HOSPITAL_COMMUNITY)
Admission: EM | Disposition: A | Discharge status: Home / Self Care | Payer: Self-pay | Source: Home / Self Care | Attending: Emergency Medicine

## 2018-02-18 DIAGNOSIS — I25110 Atherosclerotic heart disease of native coronary artery with unstable angina pectoris: Secondary | ICD-10-CM | POA: Diagnosis not present

## 2018-02-18 DIAGNOSIS — E039 Hypothyroidism, unspecified: Secondary | ICD-10-CM | POA: Diagnosis not present

## 2018-02-18 DIAGNOSIS — I420 Dilated cardiomyopathy: Secondary | ICD-10-CM | POA: Diagnosis not present

## 2018-02-18 DIAGNOSIS — I10 Essential (primary) hypertension: Secondary | ICD-10-CM | POA: Diagnosis not present

## 2018-02-18 DIAGNOSIS — Z8249 Family history of ischemic heart disease and other diseases of the circulatory system: Secondary | ICD-10-CM

## 2018-02-18 DIAGNOSIS — Z833 Family history of diabetes mellitus: Secondary | ICD-10-CM | POA: Diagnosis not present

## 2018-02-18 DIAGNOSIS — R079 Chest pain, unspecified: Secondary | ICD-10-CM

## 2018-02-18 DIAGNOSIS — E108 Type 1 diabetes mellitus with unspecified complications: Secondary | ICD-10-CM | POA: Diagnosis not present

## 2018-02-18 DIAGNOSIS — I200 Unstable angina: Secondary | ICD-10-CM | POA: Diagnosis not present

## 2018-02-18 DIAGNOSIS — E1043 Type 1 diabetes mellitus with diabetic autonomic (poly)neuropathy: Secondary | ICD-10-CM | POA: Diagnosis not present

## 2018-02-18 DIAGNOSIS — F17210 Nicotine dependence, cigarettes, uncomplicated: Secondary | ICD-10-CM | POA: Diagnosis not present

## 2018-02-18 DIAGNOSIS — Z23 Encounter for immunization: Secondary | ICD-10-CM | POA: Diagnosis not present

## 2018-02-18 DIAGNOSIS — K3184 Gastroparesis: Secondary | ICD-10-CM | POA: Diagnosis not present

## 2018-02-18 DIAGNOSIS — E785 Hyperlipidemia, unspecified: Secondary | ICD-10-CM | POA: Diagnosis not present

## 2018-02-18 DIAGNOSIS — F17200 Nicotine dependence, unspecified, uncomplicated: Secondary | ICD-10-CM | POA: Diagnosis not present

## 2018-02-18 DIAGNOSIS — E10649 Type 1 diabetes mellitus with hypoglycemia without coma: Secondary | ICD-10-CM | POA: Diagnosis not present

## 2018-02-18 HISTORY — PX: LEFT HEART CATH AND CORONARY ANGIOGRAPHY: CATH118249

## 2018-02-18 LAB — GLUCOSE, CAPILLARY
GLUCOSE-CAPILLARY: 60 mg/dL — AB (ref 65–99)
GLUCOSE-CAPILLARY: 61 mg/dL — AB (ref 65–99)
GLUCOSE-CAPILLARY: 117 mg/dL — AB (ref 65–99)
GLUCOSE-CAPILLARY: 254 mg/dL — AB (ref 65–99)
Glucose-Capillary: 153 mg/dL — ABNORMAL HIGH (ref 65–99)
Glucose-Capillary: 74 mg/dL (ref 65–99)
Glucose-Capillary: 121 mg/dL — ABNORMAL HIGH (ref 65–99)
Glucose-Capillary: 112 mg/dL — ABNORMAL HIGH (ref 65–99)

## 2018-02-18 LAB — ECHOCARDIOGRAM COMPLETE
Height: 61 in
Weight: 1857.6 oz

## 2018-02-18 LAB — PROTIME-INR
INR: 1.11
Prothrombin Time: 14.2 seconds (ref 11.4–15.2)

## 2018-02-18 LAB — LIPID PANEL
CHOL/HDL RATIO: 3.4 ratio
CHOLESTEROL: 129 mg/dL (ref 0–200)
HDL: 38 mg/dL — ABNORMAL LOW (ref >40)
LDL CALC: 63 mg/dL (ref 0–99)
TRIGLYCERIDES: 140 mg/dL (ref <150)
VLDL: 28 mg/dL (ref 0–40)

## 2018-02-18 LAB — TROPONIN I

## 2018-02-18 LAB — HIV ANTIBODY (ROUTINE TESTING W REFLEX): HIV SCREEN 4TH GENERATION: NONREACTIVE

## 2018-02-18 SURGERY — LEFT HEART CATH AND CORONARY ANGIOGRAPHY
Anesthesia: LOCAL

## 2018-02-18 MED ORDER — FENTANYL CITRATE (PF) 100 MCG/2ML IJ SOLN
INTRAMUSCULAR | Status: DC | PRN
  Administered 2018-02-18: 25 ug via INTRAVENOUS

## 2018-02-18 MED ORDER — LIDOCAINE HCL (PF) 1 % IJ SOLN
INTRAMUSCULAR | Status: AC
  Filled 2018-02-18: qty 30

## 2018-02-18 MED ORDER — DEXTROSE 50 % IV SOLN
25.0000 mL | Freq: Once | INTRAVENOUS | Status: AC
  Administered 2018-02-18: 25 mL via INTRAVENOUS

## 2018-02-18 MED ORDER — VERAPAMIL HCL 2.5 MG/ML IV SOLN
INTRAVENOUS | Status: AC
  Filled 2018-02-18: qty 2

## 2018-02-18 MED ORDER — FENTANYL CITRATE (PF) 100 MCG/2ML IJ SOLN
INTRAMUSCULAR | Status: AC
  Filled 2018-02-18: qty 2

## 2018-02-18 MED ORDER — IOPAMIDOL (ISOVUE-370) INJECTION 76%
INTRAVENOUS | Status: AC
  Filled 2018-02-18: qty 100

## 2018-02-18 MED ORDER — IOPAMIDOL (ISOVUE-370) INJECTION 76%
INTRAVENOUS | Status: DC | PRN
  Administered 2018-02-18: 30 mL

## 2018-02-18 MED ORDER — VERAPAMIL HCL 2.5 MG/ML IV SOLN
INTRAVENOUS | Status: DC | PRN
  Administered 2018-02-18: 10 mL via INTRA_ARTERIAL

## 2018-02-18 MED ORDER — DEXTROSE-NACL 5-0.9 % IV SOLN
INTRAVENOUS | Status: DC
  Administered 2018-02-18: 05:00:00 via INTRAVENOUS

## 2018-02-18 MED ORDER — DEXTROSE 50 % IV SOLN
INTRAVENOUS | Status: AC
  Administered 2018-02-18: 25 mL via INTRAVENOUS
  Filled 2018-02-18: qty 50

## 2018-02-18 MED ORDER — HEPARIN (PORCINE) IN NACL 2-0.9 UNITS/ML
INTRAMUSCULAR | Status: AC | PRN
  Administered 2018-02-18: 1000 mL

## 2018-02-18 MED ORDER — MIDAZOLAM HCL 2 MG/2ML IJ SOLN
INTRAMUSCULAR | Status: AC
  Filled 2018-02-18: qty 2

## 2018-02-18 MED ORDER — LIDOCAINE HCL (PF) 1 % IJ SOLN
INTRAMUSCULAR | Status: DC | PRN
  Administered 2018-02-18: 2 mL

## 2018-02-18 MED ORDER — MIDAZOLAM HCL 2 MG/2ML IJ SOLN
INTRAMUSCULAR | Status: DC | PRN
  Administered 2018-02-18: 2 mg via INTRAVENOUS

## 2018-02-18 MED ORDER — HEPARIN (PORCINE) IN NACL 1000-0.9 UT/500ML-% IV SOLN
INTRAVENOUS | Status: AC
  Filled 2018-02-18: qty 1000

## 2018-02-18 MED ORDER — HEPARIN SODIUM (PORCINE) 1000 UNIT/ML IJ SOLN
INTRAMUSCULAR | Status: AC
  Filled 2018-02-18: qty 1

## 2018-02-18 MED ORDER — POLYETHYLENE GLYCOL 3350 17 G PO PACK
17.0000 g | PACK | Freq: Every day | ORAL | Status: DC
  Administered 2018-02-18: 17 g via ORAL
  Filled 2018-02-18: qty 1

## 2018-02-18 MED ORDER — ACETAMINOPHEN 325 MG PO TABS
650.0000 mg | ORAL_TABLET | ORAL | Status: DC | PRN
  Administered 2018-02-18 – 2018-02-19 (×2): 650 mg via ORAL
  Filled 2018-02-18 (×2): qty 2

## 2018-02-18 MED ORDER — HEPARIN SODIUM (PORCINE) 1000 UNIT/ML IJ SOLN
INTRAMUSCULAR | Status: DC | PRN
  Administered 2018-02-18: 2500 [IU] via INTRAVENOUS

## 2018-02-18 SURGICAL SUPPLY — 12 items
CATH 5FR JL3.5 JR4 ANG PIG MP (CATHETERS) ×2 IMPLANT
COVER PRB 48X5XTLSCP FOLD TPE (BAG) ×1 IMPLANT
COVER PROBE 5X48 (BAG) ×1
DEVICE RAD TR BAND REGULAR (VASCULAR PRODUCTS) ×2 IMPLANT
ELECT DEFIB PAD ADLT CADENCE (PAD) ×2 IMPLANT
GLIDESHEATH SLEND SS 6F .021 (SHEATH) ×2 IMPLANT
GUIDEWIRE INQWIRE 1.5J.035X260 (WIRE) ×1 IMPLANT
INQWIRE 1.5J .035X260CM (WIRE) ×2
KIT HEART LEFT (KITS) ×2 IMPLANT
PACK CARDIAC CATHETERIZATION (CUSTOM PROCEDURE TRAY) ×2 IMPLANT
TRANSDUCER W/STOPCOCK (MISCELLANEOUS) ×2 IMPLANT
TUBING CIL FLEX 10 FLL-RA (TUBING) ×2 IMPLANT

## 2018-02-18 NOTE — Progress Notes (Signed)
Inpatient Diabetes Program Recommendations  AACE/ADA: New Consensus Statement on Inpatient Glycemic Control (2015)  Target Ranges:  Prepandial:   less than 140 mg/dL      Peak postprandial:   less than 180 mg/dL (1-2 hours)      Critically ill patients:  140 - 180 mg/dL   Results for ARMANDO, LAUMAN (MRN [id number]) as of 02/18/2018 08:39  Ref. Range 02/17/2018 12:11 02/17/2018 16:13 02/17/2018 19:14 02/17/2018 19:41 02/17/2018 20:06 02/17/2018 23:20 02/18/2018 03:22 02/18/2018 04:01 02/18/2018 04:50 02/18/2018 05:26 02/18/2018 08:23  Glucose-Capillary Latest Ref Range: 65 - 99 mg/dL 77 296 (H) 111 (H) 61 (L) 83 116 (H) 60 (L) 153 (H) 74 61 (L) 121 (H)    Review of Glycemic Control  Diabetes history: DM 1 Outpatient Diabetes medications: NPH 10 units BID, humalog 1:10 Carb Coverage at meal time Current orders for Inpatient glycemic control: NPH 10 units BID, Novolog 0-9 units tid + Novolog HS scale 0-5 units  Inpatient Diabetes Program Recommendations:    Patient had hypoglycemia repeatedly yesterday and into this am on home regimen. Consider decreasing NPH to 8 units BID.  Thanks,  Tama Headings RN, MSN, BC-ADM, [facility] Inpatient Diabetes Coordinator Team Pager [phone] (8a-5p)

## 2018-02-18 NOTE — Progress Notes (Signed)
Cards paged about Nitro gtt; was turned off while at cath lab; currently off - pt states no chest pain. Will continue to monitor.  [place]  Jewell Ryans, RN

## 2018-02-18 NOTE — H&P (View-Only) (Signed)
Progress Note  Patient Name: Karen Hayden Date of Encounter: 02/18/2018  Primary Cardiologist: No primary care provider on file.   Subjective   Chest pain free since started on NTG drip. She states that "nitro helped tremendously".  Inpatient Medications    Scheduled Meds: . aspirin EC  81 mg Oral Daily  . gabapentin  300 mg Oral TID  . heparin injection (subcutaneous)  5,000 Units Subcutaneous Q8H  . insulin aspart  0-5 Units Subcutaneous QHS  . insulin aspart  0-9 Units Subcutaneous TID WC  . insulin NPH Human  10 Units Subcutaneous BID AC & HS  . lisinopril  10 mg Oral Daily  . metoCLOPramide  10 mg Oral QHS  . pneumococcal 23 valent vaccine  0.5 mL Intramuscular Tomorrow-1000  . rosuvastatin  40 mg Oral q1800  . sodium chloride flush  3 mL Intravenous Q12H   Continuous Infusions: . sodium chloride    . sodium chloride 161 mL/hr at 02/18/18 0607  . sodium chloride 3 mL/kg/hr (02/18/18 0604)   Followed by  . sodium chloride    . dextrose 5 % and 0.9% NaCl 50 mL/hr at 02/18/18 0607  . nitroGLYCERIN 10 mcg/min (02/18/18 0607)   PRN Meds: sodium chloride, sodium chloride, acetaminophen, gi cocktail, morphine injection, ondansetron (ZOFRAN) IV, sodium chloride flush   Vital Signs    Vitals:   02/17/18 2123 02/17/18 2353 02/18/18 0300 02/18/18 0450  BP:  (!) 114/56 96/71   Pulse:  69 64   Resp:  12 14   Temp:  98 F (36.7 C) 97.8 F (36.6 C)   TempSrc:  Oral Oral   SpO2:  98% 100%   Weight: 116 lb (52.6 kg)   116 lb 1.6 oz (52.7 kg)  Height:        Intake/Output Summary (Last 24 hours) at 02/18/2018 0639 Last data filed at 02/18/2018 1025 Gross per 24 hour  Intake 1730.44 ml  Output 1000 ml  Net 730.44 ml   Filed Weights   02/17/18 1256 02/17/18 2123 02/18/18 0450  Weight: 118 lb 1.6 oz (53.6 kg) 116 lb (52.6 kg) 116 lb 1.6 oz (52.7 kg)    Telemetry    SB to SR - Personally Reviewed  ECG    SR, normal ECG - Personally Reviewed  Physical Exam    GEN: No acute distress.   Neck: No JVD Cardiac: RRR, no murmurs, rubs, or gallops.  Respiratory: Clear to auscultation bilaterally. GI: Soft, nontender, non-distended  MS: No edema; No deformity. Neuro:  Nonfocal  Psych: Normal affect   Labs    Chemistry Recent Labs  Lab 02/17/18 0859  NA 137  K 4.8  CL 107  CO2 20*  GLUCOSE 234*  BUN 10  CREATININE 0.86  CALCIUM 9.5  GFRNONAA >60  GFRAA >60  ANIONGAP 10     Hematology Recent Labs  Lab 02/17/18 0859  WBC 9.0  RBC 5.03  HGB 13.9  HCT 42.9  MCV 85.3  MCH 27.6  MCHC 32.4  RDW 13.2  PLT 350    Cardiac Enzymes Recent Labs  Lab 02/17/18 1203 02/17/18 1828 02/17/18 2309  TROPONINI <0.03 <0.03 <0.03    Recent Labs  Lab 02/17/18 0843  TROPIPOC 0.00     BNPNo results for input(s): BNP, PROBNP in the last 168 hours.   DDimer No results for input(s): DDIMER in the last 168 hours.   Radiology    Dg Chest Port 1 View  Result Date: 02/17/2018 CLINICAL  DATA:  Chest pain and shortness of breath.  Tobacco use. EXAM: PORTABLE CHEST 1 VIEW COMPARISON:  May 07, 2017 FINDINGS: Lungs are mildly hyperexpanded. There is no edema or consolidation. Heart size and pulmonary vascularity are normal. No adenopathy. No bone lesions. IMPRESSION: Lungs mildly hyperexpanded.  No edema or consolidation. Electronically Signed   By: Lowella Grip III M.D.   On: 02/17/2018 08:57    Cardiac Studies   Echo is pending  Patient Profile     42 y.o. female older appearing female who presented with typical retrosternal chest pressure associated with diaphoresis, shortness of breath and radiation and numbness in her left arm.  The pain was on and off but escalating, she called EMS, received nitroglycerin that helped significantly however her pain recurred again relieved by nitroglycerin currently still feeling short of breath and sweating. She has very strong family history of coronary artery disease, her mother had  myocardial infarction at age of 47, grandfather age 27, completely unrelated to coronary artery disease however her daughter had influenza induced cardiomyopathy in February 2018 with acute cardiogenic and respiratory failure, on ECMO and ultimately ended with heart and lung transplant.  The patient is a lifelong smoker, she also has poorly controlled type 1 diabetes for over 20 years now.  This presentation is highly suspicious for unstable angina, will start heparin drip, nitroglycerin drip, her EKG is currently normal and her troponin is negative, she has recurrent chest pain we will catheter earlier tonight, otherwise will wait for the morning.  We will keep her n.p.o., she might need to be started on dextrose drip given her type 1 diabetes.  We will start on rosuvastatin 40 mg po daily as she is allergic to atorvastatin, add aspirin 81 mg daily, she is already bradycardic so no beta-blockers but we will start lisinopril for hypertension.  Assessment & Plan    1. UA, troponin negative x3 2. HTN 3. FH of premature CAD 4. HLP 5. Smoking 6. IDDM, type 1  Plan: cardiac catheterization today.Started on D50 at 4 am today as she is type 1 diabetic and developed hypoglycemia. Now asymptomatic, NPO.  For questions or updates, please contact [place] Please consult [email or web address] for contact info under Cardiology/STEMI.     Signed, Ena Dawley, MD  02/18/2018, 6:39 AM

## 2018-02-18 NOTE — Progress Notes (Signed)
  Echocardiogram 2D Echocardiogram has been performed.  Karen Hayden F 02/18/2018, 12:11 PM

## 2018-02-18 NOTE — Progress Notes (Signed)
Progress Note  Patient Name: Karen Hayden Date of Encounter: 02/18/2018  Primary Cardiologist: No primary care provider on file.   Subjective   Chest pain free since started on NTG drip. She states that "nitro helped tremendously".  Inpatient Medications    Scheduled Meds: . aspirin EC  81 mg Oral Daily  . gabapentin  300 mg Oral TID  . heparin injection (subcutaneous)  5,000 Units Subcutaneous Q8H  . insulin aspart  0-5 Units Subcutaneous QHS  . insulin aspart  0-9 Units Subcutaneous TID WC  . insulin NPH Human  10 Units Subcutaneous BID AC & HS  . lisinopril  10 mg Oral Daily  . metoCLOPramide  10 mg Oral QHS  . pneumococcal 23 valent vaccine  0.5 mL Intramuscular Tomorrow-1000  . rosuvastatin  40 mg Oral q1800  . sodium chloride flush  3 mL Intravenous Q12H   Continuous Infusions: . sodium chloride    . sodium chloride 161 mL/hr at 02/18/18 0607  . sodium chloride 3 mL/kg/hr (02/18/18 0604)   Followed by  . sodium chloride    . dextrose 5 % and 0.9% NaCl 50 mL/hr at 02/18/18 0607  . nitroGLYCERIN 10 mcg/min (02/18/18 0607)   PRN Meds: sodium chloride, sodium chloride, acetaminophen, gi cocktail, morphine injection, ondansetron (ZOFRAN) IV, sodium chloride flush   Vital Signs    Vitals:   02/17/18 2123 02/17/18 2353 02/18/18 0300 02/18/18 0450  BP:  (!) 114/56 96/71   Pulse:  69 64   Resp:  12 14   Temp:  98 F (36.7 C) 97.8 F (36.6 C)   TempSrc:  Oral Oral   SpO2:  98% 100%   Weight: 116 lb (52.6 kg)   116 lb 1.6 oz (52.7 kg)  Height:        Intake/Output Summary (Last 24 hours) at 02/18/2018 0639 Last data filed at 02/18/2018 1610 Gross per 24 hour  Intake 1730.44 ml  Output 1000 ml  Net 730.44 ml   Filed Weights   02/17/18 1256 02/17/18 2123 02/18/18 0450  Weight: 118 lb 1.6 oz (53.6 kg) 116 lb (52.6 kg) 116 lb 1.6 oz (52.7 kg)    Telemetry    SB to SR - Personally Reviewed  ECG    SR, normal ECG - Personally Reviewed  Physical Exam    GEN: No acute distress.   Neck: No JVD Cardiac: RRR, no murmurs, rubs, or gallops.  Respiratory: Clear to auscultation bilaterally. GI: Soft, nontender, non-distended  MS: No edema; No deformity. Neuro:  Nonfocal  Psych: Normal affect   Labs    Chemistry Recent Labs  Lab 02/17/18 0859  NA 137  K 4.8  CL 107  CO2 20*  GLUCOSE 234*  BUN 10  CREATININE 0.86  CALCIUM 9.5  GFRNONAA >60  GFRAA >60  ANIONGAP 10     Hematology Recent Labs  Lab 02/17/18 0859  WBC 9.0  RBC 5.03  HGB 13.9  HCT 42.9  MCV 85.3  MCH 27.6  MCHC 32.4  RDW 13.2  PLT 350    Cardiac Enzymes Recent Labs  Lab 02/17/18 1203 02/17/18 1828 02/17/18 2309  TROPONINI <0.03 <0.03 <0.03    Recent Labs  Lab 02/17/18 0843  TROPIPOC 0.00     BNPNo results for input(s): BNP, PROBNP in the last 168 hours.   DDimer No results for input(s): DDIMER in the last 168 hours.   Radiology    Dg Chest Port 1 View  Result Date: 02/17/2018 CLINICAL  DATA:  Chest pain and shortness of breath.  Tobacco use. EXAM: PORTABLE CHEST 1 VIEW COMPARISON:  May 07, 2017 FINDINGS: Lungs are mildly hyperexpanded. There is no edema or consolidation. Heart size and pulmonary vascularity are normal. No adenopathy. No bone lesions. IMPRESSION: Lungs mildly hyperexpanded.  No edema or consolidation. Electronically Signed   By: Lowella Grip III M.D.   On: 02/17/2018 08:57    Cardiac Studies   Echo is pending  Patient Profile     42 y.o. female older appearing female who presented with typical retrosternal chest pressure associated with diaphoresis, shortness of breath and radiation and numbness in her left arm.  The pain was on and off but escalating, she called EMS, received nitroglycerin that helped significantly however her pain recurred again relieved by nitroglycerin currently still feeling short of breath and sweating. She has very strong family history of coronary artery disease, her mother had  myocardial infarction at age of 91, grandfather age 54, completely unrelated to coronary artery disease however her daughter had influenza induced cardiomyopathy in February 2018 with acute cardiogenic and respiratory failure, on ECMO and ultimately ended with heart and lung transplant.  The patient is a lifelong smoker, she also has poorly controlled type 1 diabetes for over 20 years now.  This presentation is highly suspicious for unstable angina, will start heparin drip, nitroglycerin drip, her EKG is currently normal and her troponin is negative, she has recurrent chest pain we will catheter earlier tonight, otherwise will wait for the morning.  We will keep her n.p.o., she might need to be started on dextrose drip given her type 1 diabetes.  We will start on rosuvastatin 40 mg po daily as she is allergic to atorvastatin, add aspirin 81 mg daily, she is already bradycardic so no beta-blockers but we will start lisinopril for hypertension.  Assessment & Plan    1. UA, troponin negative x3 2. HTN 3. FH of premature CAD 4. HLP 5. Smoking 6. IDDM, type 1  Plan: cardiac catheterization today.Started on D50 at 4 am today as she is type 1 diabetic and developed hypoglycemia. Now asymptomatic, NPO.  For questions or updates, please contact [place] Please consult [email or web address] for contact info under Cardiology/STEMI.     Signed, Ena Dawley, MD  02/18/2018, 6:39 AM

## 2018-02-18 NOTE — Progress Notes (Deleted)
Notified MD on-call about patient going to cath lab in am and need for VTE order since heparin drip was d/c earlier.  Received verbal order for Heparin SQ.

## 2018-02-18 NOTE — Plan of Care (Signed)
Vitals within baseline; no current chest pain; TR band at 8cc; will continue to monitor.   [place]  Otha Monical, RN

## 2018-02-18 NOTE — Progress Notes (Signed)
Karen Hayden - Stepdown/ICU TEAM  Karen Hayden  MPN:[id number] DOB: Jul 30, 1976 DOA: 02/17/2018 PCP: Jacelyn Pi, MD    Brief Narrative:  42 y.o. female with a hx of seizures; HTN; HLD; DM; and bipolar d/o who presented with chest pressure with nausea, SOB, lightheadedness, sweaty, and tingling in her left shoulder and upper arm.  Symptoms resolved after use of NTG.    Significant Events: 6/20 admit 6/21 cardiac cath - 10% stenosis prox RCA (?spasm) - EF 55-65% - no AoS  6/21 TTE - EF 55-60% - no WMA  Subjective: Resting comfortably following her cardiac cath.  No chest pain at the present time.  No shortness of breath nausea vomiting or abdominal pain.  Assessment & Plan:  Chest pain Cardiac cath w/o signif disease - Cards suggests continued preventing tx - must stop smoking - if sx persist will consider tx for ?vasospasm - monitor tonight and if no recurrence of pain or if pain able to be well-controlled when treated for vasospasm will plan for discharge home 6/22  DM1 CBG well controlled - follow trend with advancing diet  HLD reports having been on atorvastatin in the past but unable to tolerate 2/2 to myalgias - Cards added Crestor 20mg  daily   HTN BP well controlled  Hypothyroidism Continue home medical regimen  Bipolar d/o Continue home medical regimen  Tobacco abuse  Patient has been advised to discontinue tobacco abuse  DVT prophylaxis: SQ heparin  Code Status: FULL CODE Family Communication: no family present at time of exam  Disposition Plan: anticipate d/c home 6/22  Consultants:  [facility] Cardiology   Antimicrobials:  none  Objective: Blood pressure 98/62, pulse (!) 57, temperature 98.2 F (36.8 C), temperature source Oral, resp. rate 18, height 5\' Hayden"  (Hayden.549 m), weight 52.7 kg (116 lb Hayden.6 oz), SpO2 100 %.  Intake/Output Summary (Last 24 hours) at 02/18/2018 1714 Last data filed at 02/18/2018 1507 Gross per 24 hour  Intake 1266.76 ml  Output  1500 ml  Net -233.24 ml   Filed Weights   02/17/18 1256 02/17/18 2123 02/18/18 0450  Weight: 53.6 kg (118 lb Hayden.6 oz) 52.6 kg (116 lb) 52.7 kg (116 lb Hayden.6 oz)    Examination: General: No acute respiratory distress Lungs: Clear to auscultation bilaterally without wheezes or crackles Cardiovascular: Regular rate and rhythm without murmur gallop or rub normal S1 and S2 Abdomen: Nontender, nondistended, soft, bowel sounds positive, no rebound, no ascites, no appreciable mass Extremities: No significant cyanosis, clubbing, or edema bilateral lower extremities  CBC: Recent Labs  Lab 02/17/18 0859  WBC 9.0  HGB 13.9  HCT 42.9  MCV 85.3  PLT 008   Basic Metabolic Panel: Recent Labs  Lab 02/17/18 0859  NA 137  K 4.8  CL 107  CO2 20*  GLUCOSE 234*  BUN 10  CREATININE 0.86  CALCIUM 9.5   GFR: Estimated Creatinine Clearance: 64.3 mL/min (by C-G formula based on SCr of 0.86 mg/dL).  Liver Function Tests: No results for input(s): AST, ALT, ALKPHOS, BILITOT, PROT, ALBUMIN in the last 168 hours. No results for input(s): LIPASE, AMYLASE in the last 168 hours. No results for input(s): AMMONIA in the last 168 hours.  Coagulation Profile: Recent Labs  Lab 02/18/18 0310  INR Hayden.11    Cardiac Enzymes: Recent Labs  Lab 02/17/18 1203 02/17/18 1828 02/17/18 2309  TROPONINI <0.03 <0.03 <0.03    HbA1C: Hgb A1c MFr Bld  Date/Time Value Ref Range Status  02/17/2018 12:03 PM 7.7 (  H) 4.8 - 5.6 % Final    Comment:    (NOTE) Pre diabetes:          5.7%-6.4% Diabetes:              >6.4% Glycemic control for   <7.0% adults with diabetes   05/08/2017 03:51 AM 9.Hayden (H) 4.8 - 5.6 % Final    Comment:    (NOTE) Pre diabetes:          5.7%-6.4% Diabetes:              >6.4% Glycemic control for   <7.0% adults with diabetes     CBG: Recent Labs  Lab 02/18/18 0401 02/18/18 0450 02/18/18 0526 02/18/18 0823 02/18/18 1208  GLUCAP 153* 74 61* 121* 117*    Recent Results  (from the past 240 hour(s))  MRSA PCR Screening     Status: None   Collection Time: 02/17/18  6:30 PM  Result Value Ref Range Status   MRSA by PCR NEGATIVE NEGATIVE Final    Comment:        The GeneXpert MRSA Assay (FDA approved for NASAL specimens only), is one component of a comprehensive MRSA colonization surveillance program. It is not intended to diagnose MRSA infection nor to guide or monitor treatment for MRSA infections. Performed at [facility], [place] [address], [place], [place] [address]      Scheduled Meds: . aspirin EC  81 mg Oral Daily  . gabapentin  300 mg Oral TID  . heparin injection (subcutaneous)  5,000 Units Subcutaneous Q8H  . insulin aspart  0-5 Units Subcutaneous QHS  . insulin aspart  0-9 Units Subcutaneous TID WC  . insulin NPH Human  10 Units Subcutaneous BID AC & HS  . lisinopril  10 mg Oral Daily  . metoCLOPramide  10 mg Oral QHS  . rosuvastatin  40 mg Oral q1800     LOS: 0 days   Cherene Altes, MD [facility] Office  [phone] Pager - Text Page per Amion as per below:  On-Call/Text Page:      [email or web address]      password [email or web address]  If 7PM-7AM, please contact night-coverage [email or web address] Password [facility] 02/18/2018, 5:14 PM

## 2018-02-18 NOTE — Progress Notes (Addendum)
Cards paged again about Nitro gtt - whether to start it again or d/c. CBG 117.   [place]  Javaughn Opdahl, RN

## 2018-02-18 NOTE — Progress Notes (Signed)
Hypoglycemic Event  CBG: 61  Treatment: 15 GM carbohydrate snack  Symptoms: Shaky  Follow-up CBG: Time:2006 CBG Result:83  Possible Reasons for Event: Inadequate meal intake      Sharol Given

## 2018-02-18 NOTE — Progress Notes (Signed)
Accompanied pt to cath lab; tearful/nervous; reassured pt and family.   [place]  Faylene Allerton, RN

## 2018-02-18 NOTE — Care Management Obs Status (Signed)
[place] NOTIFICATION   Patient Details  Name: Karen Hayden MRN: [id number] Date of Birth: 1975/12/22   Medicare Observation Status Notification Given:  Yes    Maryclare Labrador, RN 02/18/2018, 3:28 PM

## 2018-02-18 NOTE — Progress Notes (Signed)
Hypoglycemic Event  CBG: 60  Treatment: D50 IV 25 mL  Symptoms: Shaky  Follow-up CBG: Time:0400 CBG Result:153  Possible Reasons for Event: Inadequate meal intake    Sharol Given

## 2018-02-18 NOTE — Progress Notes (Signed)
CRITICAL VALUE ALERT  Critical Value:  68 CBG  Date & Time Notied:  11:43am  Provider Notified: No  Orders Received/Actions taken: OJ x2; recheck in 52mins

## 2018-02-18 NOTE — Progress Notes (Signed)
Medication was effective for pain has decreases to 0 on pain scale and no further c/o nausea at this time.  Will continue to monitor.

## 2018-02-18 NOTE — Progress Notes (Signed)
Patient had c/o headache 10 on pain scale and felt nauseated was given Tylenol 650 mg and Zofran 4mg  IV injection at this time for comfort. Will monitor effectiveness.

## 2018-02-18 NOTE — Progress Notes (Signed)
Doctor on-call was notified of patient going to cath lab in am.  Patient had no order for VTE, heparin drip was d/c earlier.  Received verbal order for heparin 5,000 units SQ.

## 2018-02-18 NOTE — Interval H&P Note (Signed)
Cath Lab Visit (complete for each Cath Lab visit)  Clinical Evaluation Leading to the Procedure:   ACS: Yes.    Non-ACS:    Anginal Classification: CCS IV  Anti-ischemic medical therapy: Minimal Therapy (1 class of medications)  Non-Invasive Test Results: No non-invasive testing performed  Prior CABG: No previous CABG      History and Physical Interval Note:  02/18/2018 10:16 AM  Karen Hayden  has presented today for surgery, with the diagnosis of ua  The various methods of treatment have been discussed with the patient and family. After consideration of risks, benefits and other options for treatment, the patient has consented to  Procedure(s): LEFT HEART CATH AND CORONARY ANGIOGRAPHY (N/A) as a surgical intervention .  The patient's history has been reviewed, patient examined, no change in status, stable for surgery.  I have reviewed the patient's chart and labs.  Questions were answered to the patient's satisfaction.     Karen Hayden

## 2018-02-19 DIAGNOSIS — I201 Angina pectoris with documented spasm: Secondary | ICD-10-CM

## 2018-02-19 DIAGNOSIS — E108 Type 1 diabetes mellitus with unspecified complications: Secondary | ICD-10-CM | POA: Diagnosis not present

## 2018-02-19 DIAGNOSIS — I200 Unstable angina: Secondary | ICD-10-CM | POA: Diagnosis not present

## 2018-02-19 DIAGNOSIS — F17200 Nicotine dependence, unspecified, uncomplicated: Secondary | ICD-10-CM | POA: Diagnosis not present

## 2018-02-19 LAB — COMPREHENSIVE METABOLIC PANEL
ALT: 22 U/L (ref 14–54)
AST: 26 U/L (ref 15–41)
Albumin: 2.9 g/dL — ABNORMAL LOW (ref 3.5–5.0)
Alkaline Phosphatase: 63 U/L (ref 38–126)
Anion gap: 5 (ref 5–15)
BUN: 5 mg/dL — AB (ref 6–20)
CHLORIDE: 110 mmol/L (ref 101–111)
CO2: 25 mmol/L (ref 22–32)
CREATININE: 0.63 mg/dL (ref 0.44–1.00)
Calcium: 8.8 mg/dL — ABNORMAL LOW (ref 8.9–10.3)
GFR calc Af Amer: >60 mL/min (ref >60)
GFR calc non Af Amer: >60 mL/min (ref >60)
Glucose, Bld: 65 mg/dL (ref 65–99)
Potassium: 4.1 mmol/L (ref 3.5–5.1)
Sodium: 140 mmol/L (ref 135–145)
Total Bilirubin: 0.6 mg/dL (ref 0.3–1.2)
Total Protein: 4.8 g/dL — ABNORMAL LOW (ref 6.5–8.1)

## 2018-02-19 LAB — GLUCOSE, CAPILLARY
GLUCOSE-CAPILLARY: 392 mg/dL — AB (ref 65–99)
Glucose-Capillary: 50 mg/dL — ABNORMAL LOW (ref 65–99)
Glucose-Capillary: 169 mg/dL — ABNORMAL HIGH (ref 65–99)
Glucose-Capillary: 126 mg/dL — ABNORMAL HIGH (ref 65–99)

## 2018-02-19 LAB — CBC
HCT: 37.9 % (ref 36.0–46.0)
Hemoglobin: 12 g/dL (ref 12.0–15.0)
MCH: 27.3 pg (ref 26.0–34.0)
MCHC: 31.7 g/dL (ref 30.0–36.0)
MCV: 86.1 fL (ref 78.0–100.0)
PLATELETS: 262 10*3/uL (ref 150–400)
RBC: 4.4 MIL/uL (ref 3.87–5.11)
RDW: 13.2 % (ref 11.5–15.5)
WBC: 7.3 10*3/uL (ref 4.0–10.5)

## 2018-02-19 MED ORDER — NITROGLYCERIN 0.4 MG SL SUBL: 0.4000 mg | SUBLINGUAL_TABLET | SUBLINGUAL | 1 refills | Status: DC | PRN

## 2018-02-19 MED ORDER — NITROGLYCERIN 0.4 MG SL SUBL
0.4000 mg | SUBLINGUAL_TABLET | SUBLINGUAL | Status: DC | PRN
  Administered 2018-02-19: 0.4 mg via SUBLINGUAL

## 2018-02-19 MED ORDER — ROSUVASTATIN CALCIUM 40 MG PO TABS: 40.0000 mg | ORAL_TABLET | Freq: Every day | ORAL | 0 refills | Status: DC

## 2018-02-19 NOTE — Progress Notes (Signed)
Inpatient Diabetes Program Recommendations  AACE/ADA: New Consensus Statement on Inpatient Glycemic Control (2019)  Target Ranges:  Prepandial:   less than 140 mg/dL      Peak postprandial:   less than 180 mg/dL (1-2 hours)      Critically ill patients:  140 - 180 mg/dL  Results for DEEANA, ATWATER (MRN [id number]) as of 02/19/2018 08:40  Ref. Range 02/18/2018 05:26 02/18/2018 08:23 02/18/2018 12:08 02/18/2018 17:21 02/18/2018 21:47 02/19/2018 03:12 02/19/2018 04:39  Glucose-Capillary Latest Ref Range: 65 - 99 mg/dL 61 (L) 121 (H) 117 (H) 254 (H) 112 (H) 50 (L) 169 (H)    Review of Glycemic Control  Outpatient Diabetes medications: NPH 10 units BID, Humalog 1:10 carb ratio Current orders for Inpatient glycemic control: NPH 10 units BID, Novolog 0-9 units TID with meals, Novolog 0-5 units QHS  Inpatient Diabetes Program Recommendations:  Insulin - Basal: Fasting glucose 61 mg/dl on 02/18/18 and 50 mg/dl this morning at 3:12 am. Please consider decreasing evening NPH dose to 7 units QHS (continue NPH 10 units QAM). Insulin - Meal Coverage: Please consider ordering Novolog 2 units TID with meals for meal coverage if patient eats at least 50% of meals.  Thanks, Barnie Alderman, RN, MSN, CDE Diabetes Coordinator Inpatient Diabetes Program [phone] (Team Pager from 8am to 5pm)

## 2018-02-19 NOTE — Progress Notes (Signed)
Hypoglycemic Event  CBG: 50  Treatment: 15 GM carbohydrate snack  Symptoms: Shaky  Follow-up CBG: Time: 0330 CBG Result: 73  Possible Reasons for Event: Inadequate meal intake     Sharol Given

## 2018-02-19 NOTE — Plan of Care (Signed)
Pt prepared for discharge, education provided through teach back method.

## 2018-02-19 NOTE — Discharge Instructions (Signed)
Nitroglycerin sublingual tablets What is this medicine? NITROGLYCERIN (nye troe GLI ser in) is a type of vasodilator. It relaxes blood vessels, increasing the blood and oxygen supply to your heart. This medicine is used to relieve chest pain caused by angina. It is also used to prevent chest pain before activities like climbing stairs, going outdoors in cold weather, or sexual activity. This medicine may be used for other purposes; ask your health care provider or pharmacist if you have questions. COMMON BRAND NAME(S): Nitroquick, Nitrostat, Nitrotab What should I tell my health care provider before I take this medicine? They need to know if you have any of these conditions: -anemia -head injury, recent stroke, or bleeding in the brain -liver disease -previous heart attack -an unusual or allergic reaction to nitroglycerin, other medicines, foods, dyes, or preservatives -pregnant or trying to get pregnant -breast-feeding How should I use this medicine? Take this medicine by mouth as needed. At the first sign of an angina attack (chest pain or tightness) place one tablet under your tongue. You can also take this medicine 5 to 10 minutes before an event likely to produce chest pain. Follow the directions on the prescription label. Let the tablet dissolve under the tongue. Do not swallow whole. Replace the dose if you accidentally swallow it. It will help if your mouth is not dry. Saliva around the tablet will help it to dissolve more quickly. Do not eat or drink, smoke or chew tobacco while a tablet is dissolving. If you are not better within 5 minutes after taking ONE dose of nitroglycerin, call 9-1-1 immediately to seek emergency medical care. Do not take more than 3 nitroglycerin tablets over 15 minutes. If you take this medicine often to relieve symptoms of angina, your doctor or health care professional may provide you with different instructions to manage your symptoms. If symptoms do not go away  after following these instructions, it is important to call 9-1-1 immediately. Do not take more than 3 nitroglycerin tablets over 15 minutes. Talk to your pediatrician regarding the use of this medicine in children. Special care may be needed. Overdosage: If you think you have taken too much of this medicine contact a poison control center or emergency room at once. NOTE: This medicine is only for you. Do not share this medicine with others. What if I miss a dose? This does not apply. This medicine is only used as needed. What may interact with this medicine? Do not take this medicine with any of the following medications: -certain migraine medicines like ergotamine and dihydroergotamine (DHE) -medicines used to treat erectile dysfunction like sildenafil, tadalafil, and vardenafil -riociguat This medicine may also interact with the following medications: -alteplase -aspirin -heparin -medicines for high blood pressure -medicines for mental depression -other medicines used to treat angina -phenothiazines like chlorpromazine, mesoridazine, prochlorperazine, thioridazine This list may not describe all possible interactions. Give your health care provider a list of all the medicines, herbs, non-prescription drugs, or dietary supplements you use. Also tell them if you smoke, drink alcohol, or use illegal drugs. Some items may interact with your medicine. What should I watch for while using this medicine? Tell your doctor or health care professional if you feel your medicine is no longer working. Keep this medicine with you at all times. Sit or lie down when you take your medicine to prevent falling if you feel dizzy or faint after using it. Try to remain calm. This will help you to feel better faster. If you feel  dizzy, take several deep breaths and lie down with your feet propped up, or bend forward with your head resting between your knees. You may get drowsy or dizzy. Do not drive, use machinery,  or do anything that needs mental alertness until you know how this drug affects you. Do not stand or sit up quickly, especially if you are an older patient. This reduces the risk of dizzy or fainting spells. Alcohol can make you more drowsy and dizzy. Avoid alcoholic drinks. Do not treat yourself for coughs, colds, or pain while you are taking this medicine without asking your doctor or health care professional for advice. Some ingredients may increase your blood pressure. What side effects may I notice from receiving this medicine? Side effects that you should report to your doctor or health care professional as soon as possible: -blurred vision -dry mouth -skin rash -sweating -the feeling of extreme pressure in the head -unusually weak or tired Side effects that usually do not require medical attention (report to your doctor or health care professional if they continue or are bothersome): -flushing of the face or neck -headache -irregular heartbeat, palpitations -nausea, vomiting This list may not describe all possible side effects. Call your doctor for medical advice about side effects. You may report side effects to FDA at 1-800-FDA-1088. Where should I keep my medicine? Keep out of the reach of children. Store at room temperature between 20 and 25 degrees C (68 and 77 degrees F). Store in [employer]. Protect from light and moisture. Keep tightly closed. Throw away any unused medicine after the expiration date. NOTE: This sheet is a summary. It may not cover all possible information. If you have questions about this medicine, talk to your doctor, pharmacist, or health care provider.  2018 Elsevier/Gold Standard (2013-06-15 17:57:36)   Angina Pectoris  Angina pectoris is a very bad feeling in the chest, neck, or arm. Your doctor may call it angina. There are four types of angina. Angina is caused by a lack of blood in the middle and thickest layer of the heart wall (myocardium).  Angina may feel like a crushing or squeezing pain in the chest. It may feel like tightness or heavy pressure in the chest. Some people say it feels like gas, heartburn, or indigestion. Some people have symptoms other than pain. These include:  Shortness of breath.  Cold sweats.  Feeling sick to your stomach (nausea).  Feeling light-headed.  Many women have chest discomfort and some of the other symptoms. However, women often have different symptoms, such as:  Feeling tired (fatigue).  Feeling nervous for no reason.  Feeling weak for no reason.  Dizziness or fainting.  Women may have angina without any symptoms. Follow these instructions at home:  Take medicines only as told by your doctor.  Take care of other health issues as told by your doctor. These include: ? High blood pressure (hypertension). ? Diabetes.  Follow a heart-healthy diet. Your doctor can help you to choose healthy food options and make changes.  Talk to your doctor to learn more about healthy cooking methods and use them. These include: ? Roasting. ? Grilling. ? Broiling. ? Baking. ? Poaching. ? Steaming. ? Stir-frying.  Follow an exercise program approved by your doctor.  Keep a healthy weight. Lose weight as told by your doctor.  Rest when you are tired.  Learn to manage stress.  Do not use any tobacco, such as cigarettes, chewing tobacco, or electronic cigarettes. If you need help quitting,  ask your doctor.  If you drink alcohol, and your doctor says it is okay, limit yourself to no more than 1 drink per day. One drink equals 12 ounces of beer, 5 ounces of wine, or 1 ounces of hard liquor.  Stop illegal drug use.  Keep all follow-up visits as told by your doctor. This is important. Do not take these medicines unless your doctor says that you can:  Nonsteroidal anti-inflammatory drugs (NSAIDs). These include: ? Ibuprofen. ? Naproxen. ? Celecoxib.  Vitamin supplements that have vitamin  A, vitamin E, or both.  Hormone therapy that contains estrogen with or without progestin.  Get help right away if:  You have pain in your chest, neck, arm, jaw, stomach, or back that: ? Lasts more than a few minutes. ? Comes back. ? Does not get better after you take medicine under your tongue (sublingual nitroglycerin).  You have any of these symptoms for no reason: ? Gas, heartburn, or indigestion. ? Sweating a lot. ? Shortness of breath or trouble breathing. ? Feeling sick to your stomach or throwing up. ? Feeling more tired than usual. ? Feeling nervous or worrying more than usual. ? Feeling weak. ? Diarrhea.  You are suddenly dizzy or light-headed.  You faint or pass out. These symptoms may be an emergency. Do not wait to see if the symptoms will go away. Get medical help right away. Call your local emergency services (911 in the [place] Do not drive yourself to the hospital. This information is not intended to replace advice given to you by your health care provider. Make sure you discuss any questions you have with your health care provider. Document Released: 02/03/2008 Document Revised: 01/23/2016 Document Reviewed: 12/19/2013 Elsevier Interactive Patient Education  2017 [employer].

## 2018-02-19 NOTE — Progress Notes (Signed)
Progress Note  Patient Name: Karen Hayden Date of Encounter: 02/19/2018  Primary Cardiologist: No primary care provider on file.   Subjective   Feels "100% better". No further angina. Breathing better. She credits her improvement to smoking cessation. NTG did provide prompt relief each time she had chest pain. No problems at radial access site.  Inpatient Medications    Scheduled Meds: . aspirin EC  81 mg Oral Daily  . gabapentin  300 mg Oral TID  . heparin injection (subcutaneous)  5,000 Units Subcutaneous Q8H  . insulin aspart  0-5 Units Subcutaneous QHS  . insulin aspart  0-9 Units Subcutaneous TID WC  . insulin NPH Human  10 Units Subcutaneous BID AC & HS  . lisinopril  10 mg Oral Daily  . metoCLOPramide  10 mg Oral QHS  . polyethylene glycol  17 g Oral Daily  . rosuvastatin  40 mg Oral q1800   Continuous Infusions:  PRN Meds: acetaminophen, gi cocktail, ondansetron (ZOFRAN) IV   Vital Signs    Vitals:   02/18/18 2029 02/18/18 2257 02/19/18 0300 02/19/18 0716  BP: 109/64 110/62 106/65 (!) 87/56  Pulse: 71 60 (!) 58 65  Resp: 12 15 11 19   Temp: 98 F (36.7 C) (!) 97.5 F (36.4 C) 97.8 F (36.6 C) 98 F (36.7 C)  TempSrc: Oral Oral Oral Oral  SpO2: 100% 100% 100% 99%  Weight:      Height:        Intake/Output Summary (Last 24 hours) at 02/19/2018 1129 Last data filed at 02/19/2018 0900 Gross per 24 hour  Intake 1630 ml  Output 550 ml  Net 1080 ml   Filed Weights   02/17/18 1256 02/17/18 2123 02/18/18 0450  Weight: 118 lb 1.6 oz (53.6 kg) 116 lb (52.6 kg) 116 lb 1.6 oz (52.7 kg)    Telemetry    Sinus bradycardia - Personally Reviewed  ECG    No new tracing - Personally Reviewed  Physical Exam  Very slender GEN: No acute distress.   Neck: No JVD Cardiac: RRR, no murmurs, rubs, or gallops.  Healthy radial access site Respiratory: Clear to auscultation bilaterally. GI: Soft, nontender, non-distended  MS: No edema; No deformity. Neuro:   Nonfocal  Psych: Normal affect   Labs    Chemistry Recent Labs  Lab 02/17/18 0859 02/19/18 0301  NA 137 140  K 4.8 4.1  CL 107 110  CO2 20* 25  GLUCOSE 234* 65  BUN 10 5*  CREATININE 0.86 0.63  CALCIUM 9.5 8.8*  PROT  --  4.8*  ALBUMIN  --  2.9*  AST  --  26  ALT  --  22  ALKPHOS  --  63  BILITOT  --  0.6  GFRNONAA >60 >60  GFRAA >60 >60  ANIONGAP 10 5     Hematology Recent Labs  Lab 02/17/18 0859 02/19/18 0301  WBC 9.0 7.3  RBC 5.03 4.40  HGB 13.9 12.0  HCT 42.9 37.9  MCV 85.3 86.1  MCH 27.6 27.3  MCHC 32.4 31.7  RDW 13.2 13.2  PLT 350 262    Cardiac Enzymes Recent Labs  Lab 02/17/18 1203 02/17/18 1828 02/17/18 2309  TROPONINI <0.03 <0.03 <0.03    Recent Labs  Lab 02/17/18 0843  TROPIPOC 0.00     BNPNo results for input(s): BNP, PROBNP in the last 168 hours.   DDimer No results for input(s): DDIMER in the last 168 hours.   Radiology    No results  found.  Cardiac Studies  02/18/2018 ECHO - Left ventricle: The cavity size was normal. Systolic function was   normal. The estimated ejection fraction was in the range of 55%   to 60%. Wall motion was normal; there were no regional wall   motion abnormalities. Left ventricular diastolic function   parameters were normal. - Mitral valve: There was trivial regurgitation. - Left atrium: The atrium was moderately dilated. Volume/bsa, ES   (1-plane Simpson&'s, A4C): 39.5 ml/m^2.  02/18/2018  CATH  Prox RCA lesion is 10% stenosed. This may have been catheter induced spasm.  The left ventricular systolic function is normal.  LV end diastolic pressure is normal.  The left ventricular ejection fraction is 55-65% by visual estimate.  There is no aortic valve stenosis.   Continue preventive therapy.  Smoking cessation.  If sx persist, consider medical therapy for vasospasm.   Patient Profile     42 y.o. female smoker with recurrent chest pain, NTG responsive, minor CAD.  Assessment & Plan     Strongly suspect coronary vasospasm. Smoking cessation (to which she appears firmly committed) is most important treatment. Would plan prn SL NTG. With her low BP she is unlikely to do well with daily long acting nitrates or calcium channel blockers.  For questions or updates, please contact [place] Please consult [email or web address] for contact info under Cardiology/STEMI.      Signed, Sanda Klein, MD  02/19/2018, 11:29 AM

## 2018-02-19 NOTE — Discharge Summary (Signed)
DISCHARGE SUMMARY  Karen Hayden  MR#: [id number]  DOB:Apr 22, 1976  Date of Admission: 02/17/2018 Date of Discharge: 02/19/2018  Attending Physician:Jeffrey Hennie Duos, MD  Patient's WUJ:[id number], Mindi Curling, MD  Consults: [facility] Cardiology   Disposition: D/C home   Follow-up Appts: Follow-up Information    Dorothy Spark, MD Follow up.   Specialty:  Cardiology Why:  our office will call you with a hospital follow-up visit in 2-3 weeks. Contact information: [address] [place] [address] [phone]        Jacelyn Pi, MD. Schedule an appointment as soon as possible for a visit in 1 week(s).   Specialty:  Endocrinology Contact information: [address] [place] [address] [phone]           Tests Needing Follow-up: -assess BP control  -assess nicotine abstinence -assess chest pain recurrence / frequency of use of nitro SL  Discharge Diagnoses: Chest pain - coronary artery vasospasm Tobacco abuse  DM1 HLD HTN Hypothyroidism Bipolar d/o  Initial presentation: 42 y.o.femalewith a hx ofseizures; HTN; HLD; DM; and bipolar d/o who presented with chest pressure with nausea, SOB, lightheadedness, sweaty, and tingling in her left shoulder and upper arm. Symptoms resolved after use of NTG.   Hospital Course: 6/20 admit 6/21 cardiac cath - 10% stenosis prox RCA (?spasm) - EF 55-65% - no AoS  6/21 TTE - EF 55-60% - no WMA  Chest pain Cardiac cath w/o signif atherosclerotic disease but did reveal suggestion of proximal RCA vasospasm - Cards suggests continued preventing tx - must stop smoking -one recurrent episode on date of discharge rapidly responded to nitroglycerin use -patient is provided with a prescription for nitroglycerin at time of discharge and counseled on issues - patient is educated as to the direct connection between tobacco abuse and vasospasm and the absolute requirement that she never smoked  again  DM1 CBG well controlled at time of discharge  HLD reports having been on atorvastatin in the past but unable to tolerate 2/2 to myalgias - Cards added Crestor 20mg  daily   HTN BP well controlled /marginally low such that long-acting nitrates or calcium channel blockers not able to be safely used  Hypothyroidism Continue home medical regimen  Bipolar d/o Continue home medical regimen  Tobacco abuse  Patient has been advised to discontinue tobacco abuse as discussed above    Allergies as of 02/19/2018      Reactions   Lipitor [atorvastatin]    Attacks joints, inflamation   Codeine Rash      Medication List    TAKE these medications   gabapentin 300 MG capsule Commonly known as:  NEURONTIN Take 300 mg by mouth 3 (three) times daily.   HUMALOG 100 UNIT/ML cartridge Generic drug:  insulin lispro Inject into the skin 4 (four) times daily. 1 unit per 10gm of Carbs. Counts carbs with her meals to get dosage   ibuprofen 200 MG tablet Commonly known as:  ADVIL,MOTRIN Take 800 mg by mouth as needed for moderate pain.   INSULIN SYRINGE .5CC/28G 28G X 1/2" 0.5 ML Misc Commonly known as:  INS SYRINGE/NEEDLE .5CC/28G Use as instructed to administer insulin   metoCLOPramide 10 MG tablet Commonly known as:  REGLAN Take 1 tab at bedtime. What changed:    how much to take  how to take this  when to take this  additional instructions   multivitamin with minerals Tabs tablet Take 1 tablet by mouth daily.   nitroGLYCERIN 0.4 MG SL  tablet Commonly known as:  NITROSTAT Place 1 tablet (0.4 mg total) under the tongue every 5 (five) minutes as needed for chest pain.   promethazine 25 MG tablet Commonly known as:  PHENERGAN Take 1 tab by mouth every 6-8 hours as needed for nausea & vomiting.       Day of Discharge BP (!) 97/57 (BP Location: Left Arm)   Pulse (!) 57   Temp 97.9 F (36.6 C) (Oral)   Resp 13   Ht 5\' 1"  (1.549 m)   Wt 52.7 kg (116 lb 1.6  oz)   SpO2 99%   BMI 21.94 kg/m   Physical Exam: General: No acute respiratory distress Lungs: Clear to auscultation bilaterally without wheezes or crackles Cardiovascular: Regular rate and rhythm without murmur gallop or rub normal S1 and S2 Abdomen: Nontender, nondistended, soft, bowel sounds positive, no rebound, no ascites, no appreciable mass Extremities: No significant cyanosis, clubbing, or edema bilateral lower extremities  Basic Metabolic Panel: Recent Labs  Lab 02/17/18 0859 02/19/18 0301  NA 137 140  K 4.8 4.1  CL 107 110  CO2 20* 25  GLUCOSE 234* 65  BUN 10 5*  CREATININE 0.86 0.63  CALCIUM 9.5 8.8*    Liver Function Tests: Recent Labs  Lab 02/19/18 0301  AST 26  ALT 22  ALKPHOS 63  BILITOT 0.6  PROT 4.8*  ALBUMIN 2.9*   Coags: Recent Labs  Lab 02/18/18 0310  INR 1.11   CBC: Recent Labs  Lab 02/17/18 0859 02/19/18 0301  WBC 9.0 7.3  HGB 13.9 12.0  HCT 42.9 37.9  MCV 85.3 86.1  PLT 350 262    Cardiac Enzymes: Recent Labs  Lab 02/17/18 1203 02/17/18 1828 02/17/18 2309  TROPONINI <0.03 <0.03 <0.03    CBG: Recent Labs  Lab 02/18/18 2147 02/19/18 0312 02/19/18 0439 02/19/18 0834 02/19/18 1140  GLUCAP 112* 50* 169* 126* 392*    Recent Results (from the past 240 hour(s))  MRSA PCR Screening     Status: None   Collection Time: 02/17/18  6:30 PM  Result Value Ref Range Status   MRSA by PCR NEGATIVE NEGATIVE Final    Comment:        The GeneXpert MRSA Assay (FDA approved for NASAL specimens only), is one component of a comprehensive MRSA colonization surveillance program. It is not intended to diagnose MRSA infection nor to guide or monitor treatment for MRSA infections. Performed at [facility], [place] [address] [place], [place] [address]      Time spent in discharge (includes decision making & examination of pt): 30 minutes  02/19/2018, 2:12 PM   Cherene Altes, MD [facility] Office   [phone] Pager [phone]  On-Call/Text Page:      [email or web address]      password [facility]

## 2018-02-23 DIAGNOSIS — E7800 Pure hypercholesterolemia, unspecified: Secondary | ICD-10-CM | POA: Diagnosis not present

## 2018-02-23 DIAGNOSIS — E109 Type 1 diabetes mellitus without complications: Secondary | ICD-10-CM | POA: Diagnosis not present

## 2018-02-23 DIAGNOSIS — Z09 Encounter for follow-up examination after completed treatment for conditions other than malignant neoplasm: Secondary | ICD-10-CM | POA: Diagnosis not present

## 2018-02-24 ENCOUNTER — Telehealth: Payer: Self-pay | Admitting: Cardiology

## 2018-02-24 NOTE — Telephone Encounter (Signed)
Spoke with pt post cardiac cath. Pt states she has been having chest pain and SOB since D/C from the hospital. Pt states she has  The same pain as prior having her heart cath on 02/18/18. Pt states that she has been taking NTG for the CP with relief. Yesterday pt took 3 NTG SL. Today she has taken one NTG SL. An appointment was made for pt for tomorrow 6/28 at 9:45 AM. Pt is aware to come 15 minutes early for appointment. Pt is aware that if symptoms get worse prior appointment she is  to go to the ER. Pt verbalized understanding.

## 2018-02-24 NOTE — Telephone Encounter (Signed)
NEW MESSAGE   Patient states she has been having chest pain since 02/19/18, after leaving hospital, s/p heart cath.  Pt c/o of Chest Pain: STAT if CP now or developed within 24 hours  1. Are you having CP right now? NO 2. Are you experiencing any other symptoms (ex. SOB, nausea, vomiting, sweating)? SOB  3. How long have you been experiencing CP? 5 DAYS  4. Is your CP continuous or coming and going? COMING AND GOING  5. Have you taken Nitroglycerin? YES ?

## 2018-02-25 ENCOUNTER — Ambulatory Visit (INDEPENDENT_AMBULATORY_CARE_PROVIDER_SITE_OTHER): Payer: Medicare Other | Admitting: Physician Assistant

## 2018-02-25 ENCOUNTER — Encounter: Payer: Self-pay | Admitting: Physician Assistant

## 2018-02-25 ENCOUNTER — Telehealth: Payer: Self-pay | Admitting: Physician Assistant

## 2018-02-25 VITALS — BP 152/60 | HR 52 | Ht 61.0 in | Wt 118.0 lb

## 2018-02-25 DIAGNOSIS — Z720 Tobacco use: Secondary | ICD-10-CM

## 2018-02-25 DIAGNOSIS — E109 Type 1 diabetes mellitus without complications: Secondary | ICD-10-CM

## 2018-02-25 DIAGNOSIS — I10 Essential (primary) hypertension: Secondary | ICD-10-CM

## 2018-02-25 DIAGNOSIS — R002 Palpitations: Secondary | ICD-10-CM | POA: Diagnosis not present

## 2018-02-25 DIAGNOSIS — I200 Unstable angina: Secondary | ICD-10-CM

## 2018-02-25 DIAGNOSIS — R457 State of emotional shock and stress, unspecified: Secondary | ICD-10-CM | POA: Diagnosis not present

## 2018-02-25 DIAGNOSIS — R110 Nausea: Secondary | ICD-10-CM | POA: Diagnosis not present

## 2018-02-25 DIAGNOSIS — I201 Angina pectoris with documented spasm: Secondary | ICD-10-CM

## 2018-02-25 DIAGNOSIS — R000 Tachycardia, unspecified: Secondary | ICD-10-CM | POA: Diagnosis not present

## 2018-02-25 MED ORDER — ISOSORBIDE MONONITRATE ER 30 MG PO TB24: 30.0000 mg | ORAL_TABLET | Freq: Every day | ORAL | 3 refills | Status: DC

## 2018-02-25 MED ORDER — NITROGLYCERIN 0.4 MG SL SUBL: 0.4000 mg | SUBLINGUAL_TABLET | SUBLINGUAL | 3 refills | Status: DC | PRN

## 2018-02-25 MED ORDER — ROSUVASTATIN CALCIUM 10 MG PO TABS: 10.0000 mg | ORAL_TABLET | Freq: Every day | ORAL | 3 refills | Status: DC

## 2018-02-25 NOTE — Progress Notes (Signed)
Cardiology Office Note:    Date:  02/25/2018   ID:  Karen Hayden, DOB 03-Jul-1976, MRN [id number]  PCP:  Jacelyn Pi, MD  Cardiologist:  Ena Dawley, MD   Referring MD: Jacelyn Pi, MD   Chief Complaint  Patient presents with  . Hospitalization Follow-up    Chest pain, status post cardiac catheterization    History of Present Illness:    Karen Hayden is a 42 y.o. female with insulin-dependent diabetes, hypertension, hyperlipidemia, hypothyroidism, bipolar disorder, tobacco abuse.  She was admitted 6/20-6/22 with chest discomfort.  Cardiac catheterization demonstrated no significant CAD.  There was 10% stenosis in the proximal RCA felt to be related to catheter induced spasm.  Medical therapy was recommended for possible vasospasm causing her chest discomfort.  She was not placed on long-acting nitrates or calcium channel blocker secondary to low blood pressure.  PRN nitroglycerin was recommended.  Karen Hayden returns for posthospitalization follow-up.  She is here alone.  She continues to have frequent episodes of chest discomfort.  This is relieved by nitroglycerin.  Chest symptoms are associated with shortness of breath and nausea.  She denies syncope.  She has been dizzy at times.  She has had issues with her thyroid and is followed by endocrinology.  She does describe fatigue.  She has had some ankle swelling.  She has stopped smoking.  Prior CV studies:   The following studies were reviewed today:  Echocardiogram 02/18/2018 EF 55-60, normal wall motion, normal diastolic function, trivial MR, moderate LAE  Cardiac catheterization 02/18/2018 RCA proximal 10 (? Catheter induced spasm) EF 55-65 Consider medical therapy for vasospasm if symptoms persist  Past Medical History:  Diagnosis Date  . Bipolar 1 disorder ([place])   . Chronic kidney infection   . Diabetic gastroparesis ([place])   . DM (diabetes mellitus) ([place])   . Gastroparesis    Due to diabetes   . Hyperlipidemia     . Hypertension   . Hypothyroidism    Has thyroiditis with subsequent hyperthyroidism so off Synthroid  . Seizure disorder ([place])   . Umbilical hernia    Surgical Hx: The patient  has a past surgical history that includes Vaginal hysterectomy; Tonsillectomy; and LEFT HEART CATH AND CORONARY ANGIOGRAPHY (N/A, 02/18/2018).   Current Medications: Current Meds  Medication Sig  . gabapentin (NEURONTIN) 300 MG capsule Take 300 mg by mouth 3 (three) times daily.  Marland Kitchen ibuprofen (ADVIL,MOTRIN) 200 MG tablet Take 800 mg by mouth as needed for moderate pain.  Marland Kitchen insulin lispro (HUMALOG) 100 UNIT/ML cartridge Inject into the skin 4 (four) times daily. 1 unit per 10gm of Carbs. Counts carbs with her meals to get dosage  . INSULIN SYRINGE .5CC/28G (INS SYRINGE/NEEDLE .5CC/28G) 28G X 1/2" 0.5 ML MISC Use as instructed to administer insulin  . metoCLOPramide (REGLAN) 10 MG tablet Take 1 tab at bedtime. (Patient taking differently: Take 10 mg by mouth at bedtime. Take 1 tab at bedtime.)  . Multiple Vitamin (MULTIVITAMIN WITH MINERALS) TABS tablet Take 1 tablet by mouth daily.  . nitroGLYCERIN (NITROSTAT) 0.4 MG SL tablet Place 1 tablet (0.4 mg total) under the tongue every 5 (five) minutes as needed for chest pain.  . promethazine (PHENERGAN) 25 MG tablet Take 1 tab by mouth every 6-8 hours as needed for nausea & vomiting.  . [DISCONTINUED] nitroGLYCERIN (NITROSTAT) 0.4 MG SL tablet Place 1 tablet (0.4 mg total) under the tongue every 5 (five) minutes as needed for chest pain.     Allergies:  Lipitor [atorvastatin] and Codeine   Social History   Tobacco Use  . Smoking status: Current Every Day Smoker    Packs/day: 1.00    Years: 13.00    Pack years: 13.00    Types: Cigarettes  . Smokeless tobacco: Never Used  . Tobacco comment: Tobacco info given  Substance Use Topics  . Alcohol use: Yes    Comment: occasional  . Drug use: Yes    Types: Marijuana    Comment: last use last night, uses daily      Family Hx: The patient's family history includes Diabetes in her mother; Heart attack in her mother; Ovarian cancer in her paternal grandmother. There is no history of Colon cancer.  ROS:   Please see the history of present illness.    Review of Systems  Constitution: Positive for diaphoresis and malaise/fatigue.  Cardiovascular: Positive for chest pain, leg swelling and palpitations.  Respiratory: Positive for shortness of breath.   Gastrointestinal: Positive for constipation and nausea.  Neurological: Positive for dizziness and headaches.   All other systems reviewed and are negative.   EKGs/Labs/Other Test Reviewed:    EKG:  EKG is  ordered today.  The ekg ordered today demonstrates sinus bradycardia, heart rate 52, normal axis, no ST-T wave changes, QTC 392  Recent Labs: 05/10/2017: Magnesium 1.5 02/17/2018: TSH 0.141 02/19/2018: ALT 22; BUN 5; Creatinine, Ser 0.63; Hemoglobin 12.0; Platelets 262; Potassium 4.1; Sodium 140   Recent Lipid Panel Lab Results  Component Value Date/Time   CHOL 129 02/18/2018 03:10 AM   TRIG 140 02/18/2018 03:10 AM   HDL 38 (L) 02/18/2018 03:10 AM   CHOLHDL 3.4 02/18/2018 03:10 AM   LDLCALC 63 02/18/2018 03:10 AM    Physical Exam:    VS:  BP (!) 152/60   Pulse (!) 52   Ht 5\' 1"  (1.549 m)   Wt 118 lb (53.5 kg)   SpO2 98%   BMI 22.30 kg/m     Wt Readings from Last 3 Encounters:  02/25/18 118 lb (53.5 kg)  02/18/18 116 lb 1.6 oz (52.7 kg)  06/08/17 110 lb (49.9 kg)     Physical Exam  Constitutional: She is oriented to person, place, and time. She appears well-developed and well-nourished. No distress.  HENT:  Head: Normocephalic and atraumatic.  Neck: No JVD present.  Cardiovascular: Normal rate, regular rhythm, S1 normal, S2 normal and normal heart sounds.  No murmur heard. Pulmonary/Chest: Effort normal. She has no rales.  Abdominal: Soft. There is no hepatomegaly.  Musculoskeletal: She exhibits no edema.  Right wrist without  hematoma  Neurological: She is alert and oriented to person, place, and time.  Skin: Skin is warm and dry.    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  Cardiac catheterization demonstrated no CAD.  She does have symptoms suggestive of angina that is responsive to sublingual nitroglycerin.  Her blood pressure is also elevated.  Since her symptoms are relieved by nitroglycerin, I have recommended that we start her on long-acting nitrates.  -Start isosorbide mononitrate 30 mg daily  -Continue sublingual nitroglycerin as needed  -Follow-up 4 weeks  -Reduce caffeine  Type 1 diabetes mellitus without complication ([place]) Managed by endocrinology.  10-year ASCVD risk 5.7%.  She does have a significant family history of coronary artery disease as well as insulin-dependent diabetes.  She did not have CAD documented on cardiac catheterization.  We discussed the pros and cons of starting  statin therapy.  It is not unreasonable for her to take statin therapy.  She would like to pursue this.  -Crestor 10 mg daily  -Lipids, LFTs 3 months  Essential hypertension Blood pressure above target.  Start isosorbide as noted.  Consider adjusting dose or adding amlodipine if needed.  Tobacco abuse She has quit smoking.  Dispo:  Return in about 1 month (around 03/25/2018) for Close Follow Up w/ Dr. Meda Coffee, or [name], PA-C.   Medication Adjustments/Labs and Tests Ordered: Current medicines are reviewed at length with the patient today.  Concerns regarding medicines are outlined above.  Tests Ordered: Orders Placed This Encounter  Procedures  . Lipid Profile  . Hepatic function panel  . EKG 12-Lead   Medication Changes: Meds ordered this encounter  Medications  . nitroGLYCERIN (NITROSTAT) 0.4 MG SL tablet    Sig: Place 1 tablet (0.4 mg total) under the tongue every 5 (five) minutes as needed for chest pain.    Dispense:  30 tablet     Refill:  3  . isosorbide mononitrate (IMDUR) 30 MG 24 hr tablet    Sig: Take 1 tablet (30 mg total) by mouth daily.    Dispense:  90 tablet    Refill:  3  . rosuvastatin (CRESTOR) 10 MG tablet    Sig: Take 1 tablet (10 mg total) by mouth daily.    Dispense:  90 tablet    Refill:  3    Signed, [name], PA-C  02/25/2018 2:26 PM    [facility] [place], [place], [place]  [address] Phone: [phone]; Fax: [phone]

## 2018-02-25 NOTE — Patient Instructions (Addendum)
Medication Instructions:  1. START IMDUR 30 MG DAILY  2. START CRESTOR 10 MG DAILY  3. A REFILL FOR NITROGLYCERIN HAS BEEN SENT IN   Labwork: 1. IN 3 MONTHS FOR FASTING LIPID AND LIVER PANEL ;NOTHING TO EAT AFTER MIDNIGHT THE NIGHT THE BEFORE LAB WORK. MORNING MEDS WITH WATER IS FINE   Testing/Procedures: NONE ORDERED TODAY  Follow-Up: Jacinto Reap, PAC ON 03/25/18 @ 11:45 AM   Any Other Special Instructions Will Be Listed Below (If Applicable).     If you need a refill on your cardiac medications before your next appointment, please call your pharmacy.

## 2018-02-25 NOTE — Telephone Encounter (Signed)
Paged by answering service, patient is feeling "pounding sensation" after taking her isosorbide about 30 minutes ago first time.  Denies chest pain, shortness of breath, dizziness, headache or syncope.  He does not have a blood pressure machine at home.  She manually checked her pulse while I was on hold.  It was 80.  She is feeling palpitation despite her heart rate is stable.  Advised to watch her symptoms.  If no improvement or worse in few hours, recommended to seek medical attention by calling EMS or go to ER.  Advised not to take Imdur again.  She will try to get blood pressure machine from her neighbor and check accurately.  Advised to call EMS or go to ER if heart rate above 115 on machine.  She was appreciative your call.  No prior similar episode.

## 2018-02-28 ENCOUNTER — Other Ambulatory Visit: Payer: Medicare Other

## 2018-02-28 DIAGNOSIS — E039 Hypothyroidism, unspecified: Secondary | ICD-10-CM | POA: Diagnosis not present

## 2018-02-28 DIAGNOSIS — E109 Type 1 diabetes mellitus without complications: Secondary | ICD-10-CM | POA: Diagnosis not present

## 2018-03-08 DIAGNOSIS — L709 Acne, unspecified: Secondary | ICD-10-CM | POA: Diagnosis not present

## 2018-03-08 DIAGNOSIS — E109 Type 1 diabetes mellitus without complications: Secondary | ICD-10-CM | POA: Diagnosis not present

## 2018-03-08 DIAGNOSIS — K3184 Gastroparesis: Secondary | ICD-10-CM | POA: Diagnosis not present

## 2018-03-08 DIAGNOSIS — G609 Hereditary and idiopathic neuropathy, unspecified: Secondary | ICD-10-CM | POA: Diagnosis not present

## 2018-03-08 DIAGNOSIS — E7800 Pure hypercholesterolemia, unspecified: Secondary | ICD-10-CM | POA: Diagnosis not present

## 2018-03-08 DIAGNOSIS — E039 Hypothyroidism, unspecified: Secondary | ICD-10-CM | POA: Diagnosis not present

## 2018-03-08 DIAGNOSIS — E0590 Thyrotoxicosis, unspecified without thyrotoxic crisis or storm: Secondary | ICD-10-CM | POA: Diagnosis not present

## 2018-03-25 ENCOUNTER — Encounter: Payer: Self-pay | Admitting: Physician Assistant

## 2018-03-25 ENCOUNTER — Ambulatory Visit (INDEPENDENT_AMBULATORY_CARE_PROVIDER_SITE_OTHER): Payer: Medicare Other | Admitting: Physician Assistant

## 2018-03-25 VITALS — BP 116/82 | HR 81 | Ht 61.0 in | Wt 116.0 lb

## 2018-03-25 DIAGNOSIS — I200 Unstable angina: Secondary | ICD-10-CM

## 2018-03-25 DIAGNOSIS — R002 Palpitations: Secondary | ICD-10-CM | POA: Diagnosis not present

## 2018-03-25 DIAGNOSIS — I201 Angina pectoris with documented spasm: Secondary | ICD-10-CM | POA: Insufficient documentation

## 2018-03-25 DIAGNOSIS — E108 Type 1 diabetes mellitus with unspecified complications: Secondary | ICD-10-CM

## 2018-03-25 NOTE — Progress Notes (Signed)
Cardiology Office Note:    Date:  03/25/2018   ID:  Karen Hayden, DOB [birth year], MRN [id number]  PCP:  Jacelyn Pi, MD  Cardiologist:  Ena Dawley, MD   Referring MD: Jacelyn Pi, MD   Chief Complaint  Patient presents with  . Follow-up    angina 2/2 vasospasm    History of Present Illness:    Karen Hayden is a 42 y.o. female with coronary artery vasospasm, insulin-dependent diabetes, hypertension, hyperlipidemia, hypothyroidism, bipolar disorder, tobacco abuse.    She was admitted in June 2019 with chest discomfort.  Cardiac catheterization demonstrated no significant CAD.  There was catheter induced spasm in the RCA.  She was not placed on long-acting nitrates or calcium channel blocker at the time of discharge secondary to low blood pressure.  She was last seen in clinic 02/25/2018.  I started her on Isosorbide.   [name] returns for follow up.  She developed tachycardia after her 1st dose of Isosorbide.  She did call EMS and was told her HR was 140 but it was sinus rhythm.  She has not taken it since and has not had further symptoms.  She has cut back on caffeine and quit smoking.  She has had rare chest pain since and has only taken NTG a few times.  She denies shortness of breath, syncope, leg swelling.  Unfortunately, she had to put her dog to sleep this AM.  She is somewhat upset and is having some minor chest pain today.    Prior CV studies:   The following studies were reviewed today:  Echocardiogram 02/18/2018 EF 55-60, normal wall motion, normal diastolic function, trivial MR, moderate LAE   Cardiac catheterization 02/18/2018 RCA proximal 10 (? Catheter induced spasm) EF 55-65 Consider medical therapy for vasospasm if symptoms persist   Past Medical History:  Diagnosis Date  . Bipolar 1 disorder ([place])   . Chronic kidney infection   . Diabetic gastroparesis ([place])   . DM (diabetes mellitus) ([place])   . Gastroparesis    Due to diabetes   . Hyperlipidemia     . Hypertension   . Hypothyroidism    Has thyroiditis with subsequent hyperthyroidism so off Synthroid  . Seizure disorder ([place])   . Umbilical hernia    Surgical Hx: The patient  has a past surgical history that includes Vaginal hysterectomy; Tonsillectomy; and LEFT HEART CATH AND CORONARY ANGIOGRAPHY (N/A, 02/18/2018).   Current Medications: Current Meds  Medication Sig  . gabapentin (NEURONTIN) 300 MG capsule Take 300 mg by mouth 3 (three) times daily.  Marland Kitchen ibuprofen (ADVIL,MOTRIN) 200 MG tablet Take 800 mg by mouth as needed for moderate pain.  Marland Kitchen insulin lispro (HUMALOG) 100 UNIT/ML cartridge Inject into the skin 4 (four) times daily. 1 unit per 10gm of Carbs. Counts carbs with her meals to get dosage  . INSULIN SYRINGE .5CC/28G (INS SYRINGE/NEEDLE .5CC/28G) 28G X 1/2" 0.5 ML MISC Use as instructed to administer insulin  . metoCLOPramide (REGLAN) 10 MG tablet Take 1 tab at bedtime. (Patient taking differently: Take 10 mg by mouth at bedtime. Take 1 tab at bedtime.)  . Multiple Vitamin (MULTIVITAMIN WITH MINERALS) TABS tablet Take 1 tablet by mouth daily.  . nitroGLYCERIN (NITROSTAT) 0.4 MG SL tablet Place 1 tablet (0.4 mg total) under the tongue every 5 (five) minutes as needed for chest pain.  . promethazine (PHENERGAN) 25 MG tablet Take 1 tab by mouth every 6-8 hours as needed for nausea & vomiting.  . rosuvastatin (CRESTOR)  10 MG tablet Take 1 tablet (10 mg total) by mouth daily.  . [DISCONTINUED] isosorbide mononitrate (IMDUR) 30 MG 24 hr tablet Take 1 tablet (30 mg total) by mouth daily.     Allergies:   Lipitor [atorvastatin]; Codeine; and Isosorbide   Social History   Tobacco Use  . Smoking status: Current Every Day Smoker    Packs/day: 1.00    Years: 13.00    Pack years: 13.00    Types: Cigarettes  . Smokeless tobacco: Never Used  . Tobacco comment: Tobacco info given  Substance Use Topics  . Alcohol use: Yes    Comment: occasional  . Drug use: Yes    Types: Marijuana     Comment: last use last night, uses daily     Family Hx: The patient's family history includes Diabetes in her mother; Heart attack in her mother; Ovarian cancer in her paternal grandmother. There is no history of Colon cancer.  ROS:   Please see the history of present illness.    Review of Systems  Cardiovascular: Positive for chest pain and palpitations.   All other systems reviewed and are negative.   EKGs/Labs/Other Test Reviewed:    EKG:  EKG is  ordered today.  The ekg ordered today demonstrates normal sinus rhythm, heart rate 64, normal axis, no ST-T wave changes, QTC 427  Recent Labs: 05/10/2017: Magnesium 1.5 02/17/2018: TSH 0.141 02/19/2018: ALT 22; BUN 5; Creatinine, Ser 0.63; Hemoglobin 12.0; Platelets 262; Potassium 4.1; Sodium 140   Recent Lipid Panel Lab Results  Component Value Date/Time   CHOL 129 02/18/2018 03:10 AM   TRIG 140 02/18/2018 03:10 AM   HDL 38 (L) 02/18/2018 03:10 AM   CHOLHDL 3.4 02/18/2018 03:10 AM   LDLCALC 63 02/18/2018 03:10 AM    Physical Exam:    VS:  BP 116/82   Pulse 81   Ht 5\' 1"  (1.549 m)   Wt 116 lb (52.6 kg)   SpO2 91%   BMI 21.92 kg/m     Wt Readings from Last 3 Encounters:  03/25/18 116 lb (52.6 kg)  02/25/18 118 lb (53.5 kg)  02/18/18 116 lb 1.6 oz (52.7 kg)     Physical Exam  Constitutional: She is oriented to person, place, and time. She appears well-developed and well-nourished. No distress.  HENT:  Head: Normocephalic and atraumatic.  Eyes: No scleral icterus.  Neck: No JVD present.  Cardiovascular: Normal rate and regular rhythm.  No murmur heard. Pulmonary/Chest: Effort normal. She has no rales.  Abdominal: Soft.  Musculoskeletal: She exhibits no edema.  Neurological: She is alert and oriented to person, place, and time.  Skin: Skin is warm and dry.  Psychiatric: She has a normal mood and affect.    ASSESSMENT & PLAN:    Coronary artery vasospasm ([facility]) Overall, her symptoms are stable.  She has had  much less chest discomfort since reducing caffeine and stopping smoking.  Unfortunately, she had significant side effects to isosorbide.  She has only had to take a couple nitroglycerin since she was last here.  Blood pressure is optimal.  At this point, I recommend she continue using as needed nitroglycerin.  If she has more frequent chest symptoms in the future, we could consider adding amlodipine.  Type 1 diabetes mellitus with complication ([facility]) Continue follow-up with primary care and endocrinology.  I started her on rosuvastatin at last visit for risk reduction.  Follow-up lipids and LFTs will be obtained in September.  Palpitations I suspect she had  hypotension with rebound tachycardia leading to her palpitations after taking isosorbide.  She apparently had normal sinus rhythm when EMS arrived at her house.  She is had no symptoms since stopping isosorbide.  No further work-up at this time.  If she has recurrent palpitations in the future, consider a 30-day event monitor.   Dispo:  Return in about 6 months (around 09/25/2018) for Routine Follow Up with Dr. Meda Coffee, or [name], PA-C.   Medication Adjustments/Labs and Tests Ordered: Current medicines are reviewed at length with the patient today.  Concerns regarding medicines are outlined above.  Tests Ordered: Orders Placed This Encounter  Procedures  . Lipid Profile  . Hepatic function panel  . EKG 12-Lead   Medication Changes: No orders of the defined types were placed in this encounter.   Signed, [name], PA-C  03/25/2018 1:11 PM    [facility] [place], [place], [place]  [address] Phone: [phone]; Fax: [phone]

## 2018-03-25 NOTE — Patient Instructions (Signed)
Medication Instructions:  Do not take Isosorbide again. Otherwise, no changes.   Labwork: Lipids and LFTs in 05/2018  Testing/Procedures: None   Follow-Up: Ena Dawley, MD or [name], PA-C in 6 mos.  Any Other Special Instructions Will Be Listed Below (If Applicable).  If you need a refill on your cardiac medications before your next appointment, please call your pharmacy.

## 2018-05-27 ENCOUNTER — Other Ambulatory Visit: Payer: Medicare Other

## 2018-05-30 DIAGNOSIS — Z23 Encounter for immunization: Secondary | ICD-10-CM | POA: Diagnosis not present

## 2018-06-07 DIAGNOSIS — E039 Hypothyroidism, unspecified: Secondary | ICD-10-CM | POA: Diagnosis not present

## 2018-06-07 DIAGNOSIS — R300 Dysuria: Secondary | ICD-10-CM | POA: Diagnosis not present

## 2018-06-08 DIAGNOSIS — E109 Type 1 diabetes mellitus without complications: Secondary | ICD-10-CM | POA: Diagnosis not present

## 2018-06-08 DIAGNOSIS — E0590 Thyrotoxicosis, unspecified without thyrotoxic crisis or storm: Secondary | ICD-10-CM | POA: Diagnosis not present

## 2018-06-08 DIAGNOSIS — E039 Hypothyroidism, unspecified: Secondary | ICD-10-CM | POA: Diagnosis not present

## 2018-06-08 DIAGNOSIS — F329 Major depressive disorder, single episode, unspecified: Secondary | ICD-10-CM | POA: Diagnosis not present

## 2018-07-05 ENCOUNTER — Encounter: Payer: Self-pay | Admitting: Physician Assistant

## 2018-07-06 ENCOUNTER — Ambulatory Visit: Payer: Medicare Other | Admitting: Physician Assistant

## 2018-07-06 ENCOUNTER — Emergency Department (HOSPITAL_COMMUNITY)
Admission: EM | Admit: 2018-07-06 | Discharge: 2018-07-07 | Disposition: A | Discharge status: Home / Self Care | Payer: Medicare Other | Attending: Emergency Medicine | Admitting: Emergency Medicine

## 2018-07-06 ENCOUNTER — Encounter: Payer: Self-pay | Admitting: Physician Assistant

## 2018-07-06 ENCOUNTER — Ambulatory Visit (HOSPITAL_COMMUNITY)
Admission: RE | Admit: 2018-07-06 | Discharge: 2018-07-06 | Disposition: A | Discharge status: Home / Self Care | Payer: Medicare Other | Source: Home / Self Care | Attending: Psychiatry | Admitting: Psychiatry

## 2018-07-06 ENCOUNTER — Ambulatory Visit (INDEPENDENT_AMBULATORY_CARE_PROVIDER_SITE_OTHER): Payer: Medicare Other | Admitting: Physician Assistant

## 2018-07-06 VITALS — BP 142/100 | HR 95 | Ht 61.0 in | Wt 111.4 lb

## 2018-07-06 DIAGNOSIS — R002 Palpitations: Secondary | ICD-10-CM

## 2018-07-06 DIAGNOSIS — E785 Hyperlipidemia, unspecified: Secondary | ICD-10-CM | POA: Insufficient documentation

## 2018-07-06 DIAGNOSIS — F17210 Nicotine dependence, cigarettes, uncomplicated: Secondary | ICD-10-CM

## 2018-07-06 DIAGNOSIS — F4311 Post-traumatic stress disorder, acute: Secondary | ICD-10-CM | POA: Insufficient documentation

## 2018-07-06 DIAGNOSIS — R569 Unspecified convulsions: Secondary | ICD-10-CM

## 2018-07-06 DIAGNOSIS — F329 Major depressive disorder, single episode, unspecified: Secondary | ICD-10-CM

## 2018-07-06 DIAGNOSIS — Z794 Long term (current) use of insulin: Secondary | ICD-10-CM | POA: Insufficient documentation

## 2018-07-06 DIAGNOSIS — F17200 Nicotine dependence, unspecified, uncomplicated: Secondary | ICD-10-CM

## 2018-07-06 DIAGNOSIS — E1065 Type 1 diabetes mellitus with hyperglycemia: Secondary | ICD-10-CM | POA: Diagnosis not present

## 2018-07-06 DIAGNOSIS — I10 Essential (primary) hypertension: Secondary | ICD-10-CM

## 2018-07-06 DIAGNOSIS — K3184 Gastroparesis: Secondary | ICD-10-CM

## 2018-07-06 DIAGNOSIS — I200 Unstable angina: Secondary | ICD-10-CM

## 2018-07-06 DIAGNOSIS — R112 Nausea with vomiting, unspecified: Secondary | ICD-10-CM | POA: Diagnosis not present

## 2018-07-06 DIAGNOSIS — I201 Angina pectoris with documented spasm: Secondary | ICD-10-CM

## 2018-07-06 DIAGNOSIS — E1143 Type 2 diabetes mellitus with diabetic autonomic (poly)neuropathy: Secondary | ICD-10-CM | POA: Insufficient documentation

## 2018-07-06 DIAGNOSIS — Z79899 Other long term (current) drug therapy: Secondary | ICD-10-CM | POA: Diagnosis not present

## 2018-07-06 DIAGNOSIS — E039 Hypothyroidism, unspecified: Secondary | ICD-10-CM | POA: Insufficient documentation

## 2018-07-06 DIAGNOSIS — R739 Hyperglycemia, unspecified: Secondary | ICD-10-CM

## 2018-07-06 HISTORY — DX: Post-traumatic stress disorder, acute: F43.11

## 2018-07-06 HISTORY — DX: Palpitations: R00.2

## 2018-07-06 LAB — COMPREHENSIVE METABOLIC PANEL
ALT: 21 IU/L (ref 0–32)
AST: 20 IU/L (ref 0–40)
Albumin/Globulin Ratio: 2.1 (ref 1.2–2.2)
Albumin: 4.7 g/dL (ref 3.5–5.5)
Alkaline Phosphatase: 104 IU/L (ref 39–117)
BUN/Creatinine Ratio: 12 (ref 9–23)
BUN: 9 mg/dL (ref 6–24)
Bilirubin Total: 0.5 mg/dL (ref 0.0–1.2)
CALCIUM: 10.3 mg/dL — AB (ref 8.7–10.2)
CO2: 25 mmol/L (ref 20–29)
Chloride: 98 mmol/L (ref 96–106)
Creatinine, Ser: 0.74 mg/dL (ref 0.57–1.00)
GFR, EST AFRICAN AMERICAN: 115 mL/min/{1.73_m2} (ref >59)
GFR, EST NON AFRICAN AMERICAN: 100 mL/min/{1.73_m2} (ref >59)
GLOBULIN, TOTAL: 2.2 g/dL (ref 1.5–4.5)
Glucose: 224 mg/dL — ABNORMAL HIGH (ref 65–99)
Potassium: 4.8 mmol/L (ref 3.5–5.2)
SODIUM: 137 mmol/L (ref 134–144)
TOTAL PROTEIN: 6.9 g/dL (ref 6.0–8.5)

## 2018-07-06 LAB — CBC
HEMATOCRIT: 42.9 % (ref 34.0–46.6)
HEMOGLOBIN: 13.7 g/dL (ref 11.1–15.9)
MCH: 27 pg (ref 26.6–33.0)
MCHC: 31.9 g/dL (ref 31.5–35.7)
MCV: 84 fL (ref 79–97)
Platelets: 404 10*3/uL (ref 150–450)
RBC: 5.08 x10E6/uL (ref 3.77–5.28)
RDW: 12.6 % (ref 12.3–15.4)
WBC: 9.3 10*3/uL (ref 3.4–10.8)

## 2018-07-06 MED ORDER — METOPROLOL SUCCINATE 25 MG PO CS24: 12.5000 mg | EXTENDED_RELEASE_CAPSULE | Freq: Every day | ORAL | 3 refills | Status: DC

## 2018-07-06 NOTE — BH Assessment (Signed)
Tele Assessment Note   Patient Name: Karen Hayden MRN: [id number] Referring Physician: Patriciaann Clan, PA Location of Patient: [facility] Location of Provider: [place] is an 42 y.o. female presenting with suicidal ideations with plan to overdose on insulin medication. Patient stated "if something happen to me like low blood sugar, I wouldn't correct it". Patient has Type 1 Diabetes. Patient had to take 40 year old daughter off life support on 05/16/18. Patient continued to cry throughout assessment stating "as her mother I was supposed to protect her and I didn't". Patients daughter died due to asthma complications, difficulty breathing. Patient reported feelings of being overwhelmed, guilt, worthlessness, irritability/anger, tearful, isolating self, unable to get out of bed and increased anxiety. Patient reports poor concentration. Patient reported being overwhelmed, also because her deceist daughter has a 79 and 54 year old that she and her other daughter are taking care of. Patient reported no past suicide attempts. Patient reported picking fingers and face due to high anxiety. Patient reported living alone. 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 Patient reported approxiatemately 2 hours sleep nightly and poor appetite with weight loss of 15lbs. Patient then shared that her mother committed suicide on Mothers Day by overdose in 2001. Patient was crying sad and depressed throughout entire assessment.   Diagnosis: Major depressive disorder   Past Medical History:  Past Medical History:  Diagnosis Date  . Bipolar 1 disorder ([place])   . Chronic kidney infection   . Diabetic gastroparesis ([place])   . DM (diabetes mellitus) ([place])   . Gastroparesis    Due to diabetes   . Hyperlipidemia   . Hypertension   . Hypothyroidism    Has thyroiditis with subsequent hyperthyroidism so off Synthroid  . Seizure disorder ([place])   .  Umbilical hernia     Past Surgical History:  Procedure Laterality Date  . LEFT HEART CATH AND CORONARY ANGIOGRAPHY N/A 02/18/2018   Procedure: LEFT HEART CATH AND CORONARY ANGIOGRAPHY;  Surgeon: Jettie Booze, MD;  Location: [facility];  Service: Cardiovascular;  Laterality: N/A;  . TONSILLECTOMY    . VAGINAL HYSTERECTOMY     left ovary removed , has her right ovary    Family History:  Family History  Problem Relation Age of Onset  . Diabetes Mother   . Heart attack Mother   . Ovarian cancer Paternal Grandmother        great grandmother  . Colon cancer Neg Hx     Social History:  reports that she has been smoking cigarettes. She has a 13.00 pack-year smoking history. She has never used smokeless tobacco. She reports that she drinks alcohol. She reports that she has current or past drug history. Drug: Marijuana.  Additional Social History:  Alcohol / Drug Use Pain Medications: see MAR Prescriptions: see MAR Over the Counter: see MAR  CIWA:   COWS:    Allergies:  Allergies  Allergen Reactions  . Lipitor [Atorvastatin]     Attacks joints, inflamation  . Codeine Rash  . Isosorbide Palpitations    Rapid HR after 1st dose - ?hypotension with rebound tachy / she tolerates prn SL NTG just fine    Home Medications:  (Not in a hospital admission)  OB/GYN Status:  No LMP recorded. Patient has had a hysterectomy.  General Assessment Data Location of Assessment: [facility] Assessment Services TTS Assessment: In system Is this a Tele or Face-to-Face Assessment?: Face-to-Face Is this  an Initial Assessment or a Re-assessment for this encounter?: Initial Assessment Patient Accompanied by:: N/A Language Other than English: No Living Arrangements: (home alone) What gender do you identify as?: Female Marital status: Single Pregnancy Status: Unknown Living Arrangements: Alone Can pt return to current living arrangement?: Yes Admission Status: Voluntary Is patient  capable of signing voluntary admission?: Yes Referral Source: Self/Family/Friend  Medical Screening Exam [facility] Walk-in ONLY) Medical Exam completed: Yes  Crisis Care Plan Living Arrangements: Alone Legal Guardian: (self) Name of Psychiatrist: (none) Name of Therapist: (none)  Education Status Is patient currently in school?: No Is the patient employed, unemployed or receiving disability?: 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 : No Suicidal Intent: Yes-Currently Present Has patient had any suicidal intent within the past 6 months prior to admission? : No Is patient at risk for suicide?: Yes Suicidal Plan?: Yes-Currently Present Has patient had any suicidal plan within the past 6 months prior to admission? : No Specify Current Suicidal Plan: (overdose on insulin, patient is diabetic) Access to Means: Yes Specify Access to Suicidal Means: (insulin in home) What has been your use of drugs/alcohol within the last 12 months?: (marijuana) Previous Attempts/Gestures: No How many times?: (0) Triggers for Past Attempts: None known Family Suicide History: Yes(2001 mother committed suicide on Mothers Day) Recent stressful life event(s): Other (Comment)(grief loss daughter died on 05-29-18) Persecutory voices/beliefs?: No Depression: Yes Depression Symptoms: Tearfulness, Loss of interest in usual pleasures, Feeling angry/irritable, Guilt, Insomnia Substance abuse history and/or treatment for substance abuse?: No  Risk to Others within the past 6 months Homicidal Ideation: No Does patient have any lifetime risk of violence toward others beyond the six months prior to admission? : No Thoughts of Harm to Others: No Current Homicidal Intent: No Current Homicidal Plan: No Access to Homicidal Means: No Identified Victim: (n/a) History of harm to  others?: No Assessment of Violence: None Noted Violent Behavior Description: (n/a) Does patient have access to weapons?: No Criminal Charges Pending?: No Does patient have a court date: No Is patient on probation?: No  Psychosis Hallucinations: None noted Delusions: None noted  Mental Status Report Appearance/Hygiene: Unremarkable Eye Contact: Good Motor Activity: Unremarkable Speech: Logical/coherent Level of Consciousness: Alert, Crying Mood: Depressed, Helpless, Sad Affect: Depressed, Sad Anxiety Level: Moderate Thought Processes: Coherent Judgement: Unimpaired Orientation: Person, Place, Time, Situation Obsessive Compulsive Thoughts/Behaviors: None  Cognitive Functioning Concentration: Normal Memory: Recent Intact, Remote Intact Is patient IDD: No Insight: Good Impulse Control: Fair Appetite: Poor Have you had any weight changes? : Loss Amount of the weight change? (lbs): (15) Sleep: Decreased Total Hours of Sleep: (2) Vegetative Symptoms: None  ADLScreening [facility] Assessment Services) Patient's cognitive ability adequate to safely complete daily activities?: Yes Patient able to express need for assistance with ADLs?: Yes Independently performs ADLs?: Yes (appropriate for developmental age)  Prior Inpatient Therapy Prior Inpatient Therapy: No  Prior Outpatient Therapy Prior Outpatient Therapy: No Does patient have an ACCT team?: No Does patient have Intensive In-House Services?  : No Does patient have [facility] services? : No Does patient have P4CC services?: No  ADL Screening (condition at time of admission) Patient's cognitive ability adequate to safely complete daily activities?: Yes Patient able to express need for assistance with ADLs?: Yes Independently performs ADLs?: Yes (appropriate for developmental age)    Disposition:  Disposition Initial Assessment Completed for this Encounter:  Yes  Patriciaann Clan, PA patient meets inpatient criteria. No [facility]  Unit beds available per Doree Albee. Patient sent to [facility] ED for stay. TTS to secure placement. TTS Clinician spoke with Denzil Hughes Charge RN, to inform of patients arrival to [facility].  This service was provided via telemedicine using a 2-way, interactive audio and video technology.  Names of all persons participating in this telemedicine service and their role in this encounter. Name: Tichina Koebel Role: patient  Name: Kirtland Bouchard, [facility] Role: TTS Clinician  Name:  Role:   Name: Role:     Venora Maples 07/06/2018 10:49 PM

## 2018-07-06 NOTE — Progress Notes (Signed)
Cardiology Office Note    Date:  07/06/2018   ID:  MAIANA Hayden, DOB 03/31/1976, MRN [id number]  PCP:  Jacelyn Pi, MD  Cardiologist: Ena Dawley, MD EPS: None  Chief Complaint  Patient presents with  . Palpitations    History of Present Illness:  Karen Hayden is a 42 y.o. female with history of coronary vasospasm, no significant CAD on cardiac catheterization 01/2018, insulin-dependent diabetes mellitus, HTN, HLD, hypothyroidism, bipolar disorder and tobacco abuse.  Has had low blood pressures so can be started on CCB's or long-acting nitrates at discharge but Karen Dopp, PA-C started on isosorbide 02/25/2018.  She developed tachycardia after her first dose of isosorbide and called EMS and her heart rate was 140 bpm but normal sinus rhythm.  She saw Karen Dopp, PA-C back 03/25/2018 at which time her chest pain was less with decreasing caffeine and smoking.  Patient comes in today for increased palpitations. She lost her 55 year old daughter in Sept to an asthmatic/PE. She was kept on a ventilator for a couple days because she was an organ donor.She left behind a 8-year-old and 32-year-old that the patient and her other daughter trying to care for.  Patient extremely upset and crying in office.  Is not in any type of counseling or seen a therapist for this.  Having a lot of skipping, fluttering in chest and is worried. Also having some chest heaviness and pressure. Diagnosed with hyperthyroidism (previously hypothyroid) and started on methimazole.  She is due for thyroid check in the next 2 weeks.   Past Medical History:  Diagnosis Date  . Bipolar 1 disorder ([place])   . Chronic kidney infection   . Diabetic gastroparesis ([place])   . DM (diabetes mellitus) ([place])   . Gastroparesis    Due to diabetes   . Hyperlipidemia   . Hypertension   . Hypothyroidism    Has thyroiditis with subsequent hyperthyroidism so off Synthroid  . Seizure disorder ([place])   . Umbilical hernia     Past  Surgical History:  Procedure Laterality Date  . LEFT HEART CATH AND CORONARY ANGIOGRAPHY N/A 02/18/2018   Procedure: LEFT HEART CATH AND CORONARY ANGIOGRAPHY;  Surgeon: Jettie Booze, MD;  Location: [place] [facility];  Service: Cardiovascular;  Laterality: N/A;  . TONSILLECTOMY    . VAGINAL HYSTERECTOMY     left ovary removed , has her right ovary    Current Medications: Current Meds  Medication Sig  . ibuprofen (ADVIL,MOTRIN) 200 MG tablet Take 800 mg by mouth as needed for moderate pain.  Marland Kitchen insulin lispro (HUMALOG) 100 UNIT/ML cartridge Inject into the skin 4 (four) times daily. 1 unit per 10gm of Carbs. Counts carbs with her meals to get dosage  . INSULIN SYRINGE .5CC/28G (INS SYRINGE/NEEDLE .5CC/28G) 28G X 1/2" 0.5 ML MISC Use as instructed to administer insulin  . methimazole (TAPAZOLE) 5 MG tablet Take 5 mg by mouth daily.  . nitroGLYCERIN (NITROSTAT) 0.4 MG SL tablet Place 1 tablet (0.4 mg total) under the tongue every 5 (five) minutes as needed for chest pain.     Allergies:   Lipitor [atorvastatin]; Codeine; and Isosorbide   Social History   Socioeconomic History  . Marital status: Legally Separated    Spouse name: Not on file  . Number of children: 2  . Years of education: Not on file  . Highest education level: Not on file  Occupational History  . Occupation: Vet Tech    Comment: Contractor  Social Needs  . Financial resource strain: Not on file  . Food insecurity:    Worry: Not on file    Inability: Not on file  . Transportation needs:    Medical: Not on file    Non-medical: Not on file  Tobacco Use  . Smoking status: Current Every Day Smoker    Packs/day: 1.00    Years: 13.00    Pack years: 13.00    Types: Cigarettes  . Smokeless tobacco: Never Used  . Tobacco comment: Tobacco info given  Substance and Sexual Activity  . Alcohol use: Yes    Comment: occasional  . Drug use: Yes    Types: Marijuana    Comment: last use last night, uses  daily  . Sexual activity: Yes    Birth control/protection: Surgical  Lifestyle  . Physical activity:    Days per week: Not on file    Minutes per session: Not on file  . Stress: Not on file  Relationships  . Social connections:    Talks on phone: Not on file    Gets together: Not on file    Attends religious service: Not on file    Active member of club or organization: Not on file    Attends meetings of clubs or organizations: Not on file    Relationship status: Not on file  Other Topics Concern  . Not on file  Social History Narrative  . Not on file     Family History:  The patient's family history includes Diabetes in her mother; Heart attack in her mother; Ovarian cancer in her paternal grandmother.   ROS:   Please see the history of present illness.    Review of Systems  Constitution: Positive for decreased appetite.  HENT: Negative.   Eyes: Negative.   Cardiovascular: Positive for chest pain, irregular heartbeat and palpitations.  Respiratory: Negative.   Hematologic/Lymphatic: Negative.   Musculoskeletal: Negative.  Negative for joint pain.  Gastrointestinal: Negative.   Genitourinary: Negative.   Neurological: Positive for headaches.  Psychiatric/Behavioral: Positive for depression. The patient is nervous/anxious.    All other systems reviewed and are negative.   PHYSICAL EXAM:   VS:  BP (!) 142/100   Pulse 95   Ht _0  (1.549 m)   Wt 111 lb 6.4 oz (50.5 kg)   SpO2 99%   BMI 21.05 kg/m   Physical Exam  GEN: Thin, in no acute distress  Neck: no JVD, carotid bruits, or masses Cardiac:RRR; no murmurs, rubs, or gallops  Respiratory:  clear to auscultation bilaterally, normal work of breathing GI: soft, nontender, nondistended, + BS Ext: without cyanosis, clubbing, or edema, Good distal pulses bilaterally Neuro:  Alert and Oriented x 3 Psych: Extremely anxious, tearful, grieving  Wt Readings from Last 3 Encounters:  07/06/18 111 lb 6.4 oz (50.5 kg)    03/25/18 116 lb (52.6 kg)  02/25/18 118 lb (53.5 kg)      Studies/Labs Reviewed:   EKG:  EKG is ordered today.  The ekg ordered today demonstrates normal sinus rhythm, no acute change  Recent Labs: 02/17/2018: TSH 0.141 02/19/2018: ALT 22; BUN 5; Creatinine, Ser 0.63; Hemoglobin 12.0; Platelets 262; Potassium 4.1; Sodium 140   Lipid Panel    Component Value Date/Time   CHOL 129 02/18/2018 0310   TRIG 140 02/18/2018 0310   HDL 38 (L) 02/18/2018 0310   CHOLHDL 3.4 02/18/2018 0310   VLDL 28 02/18/2018 0310   LDLCALC 63 02/18/2018 0310    Additional  studies/ records that were reviewed today include:  2D echo 6/2019Study Conclusions   - Left ventricle: The cavity size was normal. Systolic function was   normal. The estimated ejection fraction was in the range of 55%   to 60%. Wall motion was normal; there were no regional wall   motion abnormalities. Left ventricular diastolic function   parameters were normal. - Mitral valve: There was trivial regurgitation. - Left atrium: The atrium was moderately dilated. Volume/bsa, ES   (1-plane Simpson&'s, A4C): 39.5 ml/m^2.    Cardiac catheterization 6/21/2019Prox RCA lesion is 10% stenosed. This may have been catheter induced spasm.  The left ventricular systolic function is normal.  LV end diastolic pressure is normal.  The left ventricular ejection fraction is 55-65% by visual estimate.  There is no aortic valve stenosis.   Continue preventive therapy.  Smoking cessation.  If sx persist, consider medical therapy for vasospasm.         ASSESSMENT:    1. Coronary artery vasospasm (HCC)   2. Palpitations   3. Tobacco use disorder   4. Acute posttraumatic stress disorder      PLAN:  In order of problems listed above:  Palpitations occurred after taking 1 dose of isosorbide and heart rate got up to 140 bpm.  Having significant palpitations since the sudden loss of her daughter.  Was also diagnosed with hyperthyroidism  after being hypothyroid.  This could be contributing to her palpitations as well.  Will place a 30-day monitor.  We will also check C met and CBC.  She has also lost 14 pounds.  Coronary spasm suspected with history of chest pain and nonobstructive CAD on cath in 01/2018  Tobacco abuse quit smoking in June  Acute posttraumatic stress disorder secondary to the sudden loss of her 78 year old daughter.  She is extremely upset and tearful in the office today.  Will refer to Dr. Cheryln Manly for further counseling and treatment.  Medication Adjustments/Labs and Tests Ordered: Current medicines are reviewed at length with the patient today.  Concerns regarding medicines are outlined above.  Medication changes, Labs and Tests ordered today are listed in the Patient Instructions below. Patient Instructions  Your physician has recommended you make the following change in your medication:  START METOPROLOL 12.5 MG EVERY DAY  Your physician recommends that you return for lab work in:  [place] has recommended that you wear an event monitor. Event monitors are medical devices that record the heart's electrical activity. Doctors most often [place] these monitors to diagnose arrhythmias. Arrhythmias are problems with the speed or rhythm of the heartbeat. The monitor is a small, portable device. You can wear one while you do your normal daily activities. This is usually used to diagnose what is causing palpitations/syncope (passing out). [place] IF POSSIBLE  You have been referred to ASAP WITH DR Cheryln Manly  Your physician recommends that you schedule a follow-up appointment in:  [place], Ermalinda Barrios, PA-C  07/06/2018 10:56 AM    [facility] [address], [place], [place]  [address] Phone: [phone]; Fax: [phone]

## 2018-07-06 NOTE — Addendum Note (Signed)
Addended by: Jones Broom on: 07/06/2018 11:06 AM   Modules accepted: Orders

## 2018-07-06 NOTE — ED Notes (Signed)
Bed: [facility] Expected date:  Expected time:  Means of arrival:  Comments: 

## 2018-07-06 NOTE — Patient Instructions (Signed)
Your physician has recommended you make the following change in your medication:  START METOPROLOL 12.5 MG EVERY DAY  Your physician recommends that you return for lab work in:  [place] has recommended that you wear an event monitor. Event monitors are medical devices that record the heart's electrical activity. Doctors most often [place] these monitors to diagnose arrhythmias. Arrhythmias are problems with the speed or rhythm of the heartbeat. The monitor is a small, portable device. You can wear one while you do your normal daily activities. This is usually used to diagnose what is causing palpitations/syncope (passing out). [place] IF POSSIBLE  You have been referred to ASAP WITH DR Cheryln Manly  Your physician recommends that you schedule a follow-up appointment in:  [place]

## 2018-07-06 NOTE — ED Notes (Signed)
Karen Simon, PA patient meets inpatient criteria. No [facility] Unit beds available per [place], AC. Patient sent to [facility] ED for stay. TTS to secure placement. TTS Clinician spoke with Jennifer, [facility] Charge RN, to inform of patients arrival to [facility]. 

## 2018-07-07 ENCOUNTER — Encounter (HOSPITAL_COMMUNITY): Payer: Self-pay | Admitting: *Deleted

## 2018-07-07 ENCOUNTER — Other Ambulatory Visit: Payer: Self-pay

## 2018-07-07 ENCOUNTER — Other Ambulatory Visit: Payer: Medicare Other

## 2018-07-07 LAB — CBG MONITORING, ED
Glucose-Capillary: 148 mg/dL — ABNORMAL HIGH (ref 70–99)
Glucose-Capillary: 278 mg/dL — ABNORMAL HIGH (ref 70–99)
Glucose-Capillary: 286 mg/dL — ABNORMAL HIGH (ref 70–99)
Glucose-Capillary: 322 mg/dL — ABNORMAL HIGH (ref 70–99)
Glucose-Capillary: 375 mg/dL — ABNORMAL HIGH (ref 70–99)

## 2018-07-07 LAB — RAPID URINE DRUG SCREEN, HOSP PERFORMED
AMPHETAMINES: NOT DETECTED
BARBITURATES: NOT DETECTED
BENZODIAZEPINES: POSITIVE — AB
COCAINE: NOT DETECTED
Opiates: NOT DETECTED
TETRAHYDROCANNABINOL: POSITIVE — AB

## 2018-07-07 LAB — BLOOD GAS, VENOUS
ACID-BASE DEFICIT: 0.6 mmol/L (ref 0.0–2.0)
Bicarbonate: 24.8 mmol/L (ref 20.0–28.0)
O2 Saturation: 73.2 %
PCO2 VEN: 46.2 mmHg (ref 44.0–60.0)
PH VEN: 7.35 (ref 7.250–7.430)
Patient temperature: 98.6
pO2, Ven: 43.1 mmHg (ref 32.0–45.0)

## 2018-07-07 LAB — BASIC METABOLIC PANEL
ANION GAP: 11 (ref 5–15)
BUN: 7 mg/dL (ref 6–20)
CALCIUM: 9.4 mg/dL (ref 8.9–10.3)
CO2: 24 mmol/L (ref 22–32)
Chloride: 101 mmol/L (ref 98–111)
Creatinine, Ser: 0.8 mg/dL (ref 0.44–1.00)
GFR calc non Af Amer: >60 mL/min (ref >60)
GLUCOSE: 367 mg/dL — AB (ref 70–99)
POTASSIUM: 4 mmol/L (ref 3.5–5.1)
Sodium: 136 mmol/L (ref 135–145)

## 2018-07-07 LAB — CBC
HCT: 43.3 % (ref 36.0–46.0)
HEMOGLOBIN: 13.9 g/dL (ref 12.0–15.0)
MCH: 27.5 pg (ref 26.0–34.0)
MCHC: 32.1 g/dL (ref 30.0–36.0)
MCV: 85.7 fL (ref 80.0–100.0)
NRBC: 0 % (ref 0.0–0.2)
PLATELETS: 360 10*3/uL (ref 150–400)
RBC: 5.05 MIL/uL (ref 3.87–5.11)
RDW: 13.3 % (ref 11.5–15.5)
WBC: 7.8 10*3/uL (ref 4.0–10.5)

## 2018-07-07 LAB — URINALYSIS, ROUTINE W REFLEX MICROSCOPIC
BILIRUBIN URINE: NEGATIVE
Bacteria, UA: NONE SEEN
Hgb urine dipstick: NEGATIVE
KETONES UR: NEGATIVE mg/dL
LEUKOCYTES UA: NEGATIVE
Nitrite: NEGATIVE
Protein, ur: NEGATIVE mg/dL
Specific Gravity, Urine: 1.006 (ref 1.005–1.030)
pH: 6 (ref 5.0–8.0)

## 2018-07-07 LAB — I-STAT BETA HCG BLOOD, ED (MC, WL, AP ONLY)

## 2018-07-07 MED ORDER — SODIUM CHLORIDE 0.9 % IV BOLUS
2000.0000 mL | Freq: Once | INTRAVENOUS | Status: AC
  Administered 2018-07-07: 2000 mL via INTRAVENOUS

## 2018-07-07 MED ORDER — PROMETHAZINE HCL 25 MG/ML IJ SOLN
25.0000 mg | Freq: Once | INTRAMUSCULAR | Status: AC
  Administered 2018-07-07: 25 mg via INTRAVENOUS
  Filled 2018-07-07 (×2): qty 1

## 2018-07-07 MED ORDER — INSULIN REGULAR(HUMAN) IN NACL 100-0.9 UT/100ML-% IV SOLN
INTRAVENOUS | Status: DC
  Administered 2018-07-07: 2.2 [IU]/h via INTRAVENOUS
  Filled 2018-07-07 (×2): qty 100

## 2018-07-07 MED ORDER — PROMETHAZINE HCL 25 MG PO TABS: 25.0000 mg | ORAL_TABLET | Freq: Four times a day (QID) | ORAL | 0 refills | Status: DC | PRN

## 2018-07-07 MED ORDER — PROMETHAZINE HCL 25 MG RE SUPP: 25.0000 mg | Freq: Four times a day (QID) | RECTAL | 0 refills | Status: DC | PRN

## 2018-07-07 NOTE — ED Triage Notes (Signed)
Pt is teary in triage, states her daughter passed away in 2023-06-07.  She was given a referral to see a psychiatrist today by her Cardiologist but she went to [facility] for outpatient resources but was instructed to come to the ED for medical clearance.  She denies SI/HI.   DM:  She does not recall if she's had her insulin tonight.  [id number]-[id number].  Denies abd pain, vomiting or diarrhea but reports nausea.  Hx of gastroparesis.

## 2018-07-07 NOTE — ED Provider Notes (Signed)
[facility] DEPT Provider Note: Georgena Spurling, MD, FACEP  CSN: [id number] MRN: [id number] ARRIVAL: 07/06/18 at 2302 ROOM: [place]  Hyperglycemia and Vomiting   HISTORY OF PRESENT ILLNESS  07/07/18 12:43 AM Karen Hayden is a 42 y.o. female with type 1 diabetes.  Her daughter died in 2023-06-07 and she is had subsequent depression.  She went to behavioral health yesterday to seek outpatient resources and they sent her here for medical clearance.  She does not recall if she had her insulin yesterday evening.  She has had several hours of nausea and vomiting and believes she may be on the edge of diabetic ketoacidosis.  She has some mild associated abdominal pain and has had diarrhea for several days.  Her sugar on arrival was noted to be 322.   She saw her cardiologist yesterday regarding some type of arrhythmia.  She is scheduled for monitor placement tomorrow.   Past Medical History:  Diagnosis Date  . Bipolar 1 disorder ([place])   . Chronic kidney infection   . Diabetic gastroparesis ([place])   . DM (diabetes mellitus) ([place])   . Gastroparesis    Due to diabetes   . Hyperlipidemia   . Hypertension   . Hypothyroidism    Has thyroiditis with subsequent hyperthyroidism so off Synthroid  . Seizure disorder ([place])   . Umbilical hernia     Past Surgical History:  Procedure Laterality Date  . LEFT HEART CATH AND CORONARY ANGIOGRAPHY N/A 02/18/2018   Procedure: LEFT HEART CATH AND CORONARY ANGIOGRAPHY;  Surgeon: Jettie Booze, MD;  Location: [facility];  Service: Cardiovascular;  Laterality: N/A;  . TONSILLECTOMY    . VAGINAL HYSTERECTOMY     left ovary removed , has her right ovary    Family History  Problem Relation Age of Onset  . Diabetes Mother   . Heart attack Mother   . Ovarian cancer Paternal Grandmother        great grandmother  . Colon cancer Neg Hx     Social History   Tobacco Use  . Smoking status: Current Every Day Smoker   Packs/day: 1.00    Years: 13.00    Pack years: 13.00    Types: Cigarettes  . Smokeless tobacco: Never Used  . Tobacco comment: Tobacco info given  Substance Use Topics  . Alcohol use: Yes    Comment: occasional  . Drug use: Yes    Types: Marijuana    Comment: last use last night, uses daily    Prior to Admission medications   Medication Sig Start Date End Date Taking? Authorizing Provider  ibuprofen (ADVIL,MOTRIN) 200 MG tablet Take 800 mg by mouth as needed for moderate pain.   Yes [provider]  insulin lispro (HUMALOG) 100 UNIT/ML cartridge Inject into the skin 4 (four) times daily. 1 unit per 10gm of Carbs. Counts carbs with her meals to get dosage   Yes [provider]  insulin NPH Human (HUMULIN N,NOVOLIN N) 100 UNIT/ML injection Inject 11 Units into the skin 2 (two) times daily.   Yes [provider]  methimazole (TAPAZOLE) 5 MG tablet Take 5 mg by mouth daily.   Yes [provider]  nitroGLYCERIN (NITROSTAT) 0.4 MG SL tablet Place 1 tablet (0.4 mg total) under the tongue every 5 (five) minutes as needed for chest pain. 02/25/18  Yes Weaver, Scott T, PA-C  INSULIN SYRINGE .5CC/28G (INS SYRINGE/NEEDLE .5CC/28G) 28G X 1/2" 0.5 ML MISC Use as instructed  to administer insulin 05/10/17   Bonnielee Haff, MD  Metoprolol Succinate 25 MG CS24 Take 12.5 mg by mouth daily. 07/06/18   Imogene Burn, PA-C    Allergies Lipitor [atorvastatin]; Codeine; and Isosorbide   REVIEW OF SYSTEMS  Negative except as noted here or in the History of Present Illness.   PHYSICAL EXAMINATION  Initial Vital Signs Blood pressure 108/65, pulse 79, temperature 97.7 F (36.5 C), temperature source Oral, resp. rate (!) 22, height 4\' 3"  (1.295 m), weight 49 kg, SpO2 100 %.  Examination General: Well-developed, well-nourished female in no acute distress; appearance consistent with age of record HENT: normocephalic; atraumatic; breath nonketotic Eyes: pupils equal,  round and reactive to light; extraocular muscles intact Neck: supple Heart: regular rate and rhythm Lungs: clear to auscultation bilaterally Abdomen: soft; nondistended; mild diffuse tenderness; no masses or hepatosplenomegaly; bowel sounds present Extremities: No deformity; full range of motion; pulses normal Neurologic: Awake, alert and oriented; motor function intact in all extremities and symmetric; no facial droop Skin: Warm and dry Psychiatric: Depressed mood with congruent affect; no SI; no HI   RESULTS  Summary of this visit's results, reviewed by myself:   EKG Interpretation  Date/Time:  Thursday July 07 2018 01:14:00 EST Ventricular Rate:  73 PR Interval:    QRS Duration: 95 QT Interval:  435 QTC Calculation: 480 R Axis:   77 Text Interpretation:  Sinus rhythm Borderline prolonged PR interval Rate is faster Confirmed by Alton Tremblay, Jenny Reichmann [id number]) on 07/07/2018 1:33:41 AM      Laboratory Studies: Results for orders placed or performed during the hospital encounter of 07/06/18 (from the past 24 hour(s))  CBG monitoring, ED     Status: Abnormal   Collection Time: 07/07/18 12:03 AM  Result Value Ref Range   Glucose-Capillary 322 (H) 70 - 99 mg/dL   Comment 1 Notify RN   Basic metabolic panel     Status: Abnormal   Collection Time: 07/07/18 12:24 AM  Result Value Ref Range   Sodium 136 135 - 145 mmol/L   Potassium 4.0 3.5 - 5.1 mmol/L   Chloride 101 98 - 111 mmol/L   CO2 24 22 - 32 mmol/L   Glucose, Bld 367 (H) 70 - 99 mg/dL   BUN 7 6 - 20 mg/dL   Creatinine, Ser 0.80 0.44 - 1.00 mg/dL   Calcium 9.4 8.9 - 10.3 mg/dL   GFR calc non Af Amer >60 >60 mL/min   GFR calc Af Amer >60 >60 mL/min   Anion gap 11 5 - 15  CBC     Status: None   Collection Time: 07/07/18 12:24 AM  Result Value Ref Range   WBC 7.8 4.0 - 10.5 K/uL   RBC 5.05 3.87 - 5.11 MIL/uL   Hemoglobin 13.9 12.0 - 15.0 g/dL   HCT 43.3 36.0 - 46.0 %   MCV 85.7 80.0 - 100.0 fL   MCH 27.5 26.0 - 34.0 pg    MCHC 32.1 30.0 - 36.0 g/dL   RDW 13.3 11.5 - 15.5 %   Platelets 360 150 - 400 K/uL   nRBC 0.0 0.0 - 0.2 %  I-Stat beta hCG blood, ED     Status: None   Collection Time: 07/07/18 12:33 AM  Result Value Ref Range   I-stat hCG, quantitative <5.0 <5 mIU/mL   Comment 3          CBG monitoring, ED     Status: Abnormal   Collection Time: 07/07/18  1:12 AM  Result Value Ref Range   Glucose-Capillary 375 (H) 70 - 99 mg/dL   Comment 1 Notify RN    Comment 2 Document in Chart   Blood gas, venous     Status: None   Collection Time: 07/07/18  1:15 AM  Result Value Ref Range   O2 Content ROOM AIR L/min   Delivery systems ROOM AIR    pH, Ven 7.350 7.250 - 7.430   pCO2, Ven 46.2 44.0 - 60.0 mmHg   pO2, Ven 43.1 32.0 - 45.0 mmHg   Bicarbonate 24.8 20.0 - 28.0 mmol/L   Acid-base deficit 0.6 0.0 - 2.0 mmol/L   O2 Saturation 73.2 %   Patient temperature 98.6    Collection site VEIN    Drawn by COLLECTED BY NURSE    Sample type VENOUS   Urinalysis, Routine w reflex microscopic     Status: Abnormal   Collection Time: 07/07/18  1:29 AM  Result Value Ref Range   Color, Urine YELLOW YELLOW   APPearance CLEAR CLEAR   Specific Gravity, Urine 1.006 1.005 - 1.030   pH 6.0 5.0 - 8.0   Glucose, UA >=500 (A) NEGATIVE mg/dL   Hgb urine dipstick NEGATIVE NEGATIVE   Bilirubin Urine NEGATIVE NEGATIVE   Ketones, ur NEGATIVE NEGATIVE mg/dL   Protein, ur NEGATIVE NEGATIVE mg/dL   Nitrite NEGATIVE NEGATIVE   Leukocytes, UA NEGATIVE NEGATIVE   RBC / HPF 0-5 0 - 5 RBC/hpf   WBC, UA 0-5 0 - 5 WBC/hpf   Bacteria, UA NONE SEEN NONE SEEN   Squamous Epithelial / LPF 0-5 0 - 5   Mucus PRESENT   Rapid urine drug screen (hospital performed)     Status: Abnormal   Collection Time: 07/07/18  1:29 AM  Result Value Ref Range   Opiates NONE DETECTED NONE DETECTED   Cocaine NONE DETECTED NONE DETECTED   Benzodiazepines POSITIVE (A) NONE DETECTED   Amphetamines NONE DETECTED NONE DETECTED   Tetrahydrocannabinol  POSITIVE (A) NONE DETECTED   Barbiturates NONE DETECTED NONE DETECTED  CBG monitoring, ED     Status: Abnormal   Collection Time: 07/07/18  2:50 AM  Result Value Ref Range   Glucose-Capillary 286 (H) 70 - 99 mg/dL   Comment 1 Notify RN    Comment 2 Document in Chart   CBG monitoring, ED     Status: Abnormal   Collection Time: 07/07/18  3:29 AM  Result Value Ref Range   Glucose-Capillary 278 (H) 70 - 99 mg/dL   Comment 1 Notify RN   CBG monitoring, ED     Status: Abnormal   Collection Time: 07/07/18  4:31 AM  Result Value Ref Range   Glucose-Capillary 148 (H) 70 - 99 mg/dL   Comment 1 Notify RN    Imaging Studies: No results found.  ED COURSE and MDM  Nursing notes and initial vitals signs, including pulse oximetry, reviewed.  Vitals:   07/07/18 0315 07/07/18 0330 07/07/18 0400 07/07/18 0415  BP: 97/69 104/74 (!) 91/53 99/69  Pulse: 78 67 79 65  Resp: (!) 21 17 (!) 30 18  Temp:      TempSrc:      SpO2: 99% 100% 100% 99%  Weight:      Height:       4:37 AM Patient feeling much better.  Sugar down to 148.  No signs of diabetic ketoacidosis.  Patient declines TTS assessment.  PROCEDURES    ED DIAGNOSES     ICD-10-CM   1. Hyperglycemia  R73.9   2. Nausea and vomiting in adult R11.2        Shanon Rosser, MD 07/07/18 [id number]

## 2018-07-07 NOTE — H&P (Signed)
[facility] Screening Exam  Karen Hayden is an 42 y.o. female with history of coronary vasospasm, no significant CAD on cardiac catheterization 01/2018, insulin-dependent diabetes mellitus, HTN, HLD, hypothyroidism, bipolar disorder and tobacco abuse. Presenting to [facility] with c/o MDD with SI/plan. She is grieving the recent death of her child. She is unaccompanied, tearful and in a lot of emotional distress.  Total Time spent with patient: 20 minutes  Psychiatric Specialty Exam: Physical Exam  Constitutional: She is oriented to person, place, and time. She appears well-developed and well-nourished.  HENT:  Head: Normocephalic.  Eyes: Pupils are equal, round, and reactive to light.  Respiratory: Breath sounds normal. No respiratory distress.  Neurological: She is alert and oriented to person, place, and time. No cranial nerve deficit.  Skin: Skin is warm and dry.  Psychiatric: Her speech is normal. Judgment normal. Her affect is labile. She is withdrawn. Cognition and memory are normal. She exhibits a depressed mood. She expresses suicidal ideation. She expresses suicidal plans.    Review of Systems  Constitutional: Negative for chills, diaphoresis, fever, malaise/fatigue and weight loss.  Cardiovascular: Positive for palpitations.  Psychiatric/Behavioral: Positive for depression and suicidal ideas. Negative for hallucinations, memory loss and substance abuse. The patient is nervous/anxious and has insomnia.     There were no vitals taken for this visit.There is no height or weight on file to calculate BMI.  General Appearance: Casual  Eye Contact:  Fair  Speech:  Clear and Coherent  Volume:  Normal  Mood:  Depressed  Affect:  Congruent  Thought Process:  Goal Directed  Orientation:  Full (Time, Place, and Person)  Thought Content:  Logical  Suicidal Thoughts:  Yes.  with intent/plan  Homicidal Thoughts:  No  Memory:  Immediate;   Fair  Judgement:  Fair  Insight:   Fair  Psychomotor Activity:  Normal  Concentration: Concentration: Fair  Recall:  [employer] of Knowledge:Fair  Language: Good  Akathisia:  Negative  Handed:  Right  AIMS (if indicated):     Assets:  Desire for Improvement  Sleep:       Musculoskeletal: Strength & Muscle Tone: within normal limits Gait & Station: normal Patient leans: Backward  There were no vitals taken for this visit.  Recommendations:  Based on my evaluation the patient does not appear to have an emergency medical condition.  Laverle Hobby, PA-C 07/07/2018, 5:42 AM

## 2018-07-08 ENCOUNTER — Telehealth: Payer: Self-pay

## 2018-07-08 ENCOUNTER — Ambulatory Visit (INDEPENDENT_AMBULATORY_CARE_PROVIDER_SITE_OTHER): Payer: Medicare Other

## 2018-07-08 DIAGNOSIS — R002 Palpitations: Secondary | ICD-10-CM

## 2018-07-08 MED ORDER — METOPROLOL SUCCINATE ER 25 MG PO TB24: 12.5000 mg | ORAL_TABLET | Freq: Every day | ORAL | 3 refills | Status: DC

## 2018-07-08 NOTE — Telephone Encounter (Signed)
[facility] for metoprolol succinate capsule. Rx was sent in for metoprolol succinate 25 mg capsule with instructions for patient to take 1/2 for 12.5 mg total. Confirmed with Ermalinda Barrios, PA that patient is to start metoprolol succinate 12.5 mg QD. Rx sent in for metoprolol succinate 25 mg tablet: Take 1/2 tablet (12.5 mg total) by mouth once a day.

## 2018-07-18 DIAGNOSIS — E0590 Thyrotoxicosis, unspecified without thyrotoxic crisis or storm: Secondary | ICD-10-CM | POA: Diagnosis not present

## 2018-07-18 DIAGNOSIS — E039 Hypothyroidism, unspecified: Secondary | ICD-10-CM | POA: Diagnosis not present

## 2018-07-22 DIAGNOSIS — E039 Hypothyroidism, unspecified: Secondary | ICD-10-CM | POA: Diagnosis not present

## 2018-07-22 DIAGNOSIS — E109 Type 1 diabetes mellitus without complications: Secondary | ICD-10-CM | POA: Diagnosis not present

## 2018-07-22 DIAGNOSIS — N390 Urinary tract infection, site not specified: Secondary | ICD-10-CM | POA: Diagnosis not present

## 2018-07-22 DIAGNOSIS — R300 Dysuria: Secondary | ICD-10-CM | POA: Diagnosis not present

## 2018-07-22 DIAGNOSIS — G609 Hereditary and idiopathic neuropathy, unspecified: Secondary | ICD-10-CM | POA: Diagnosis not present

## 2018-07-22 DIAGNOSIS — E7800 Pure hypercholesterolemia, unspecified: Secondary | ICD-10-CM | POA: Diagnosis not present

## 2018-08-06 ENCOUNTER — Encounter: Payer: Self-pay | Admitting: Cardiology

## 2018-08-09 ENCOUNTER — Ambulatory Visit (INDEPENDENT_AMBULATORY_CARE_PROVIDER_SITE_OTHER): Payer: Medicare Other | Admitting: Psychology

## 2018-08-09 DIAGNOSIS — F339 Major depressive disorder, recurrent, unspecified: Secondary | ICD-10-CM | POA: Diagnosis not present

## 2018-08-12 ENCOUNTER — Ambulatory Visit (INDEPENDENT_AMBULATORY_CARE_PROVIDER_SITE_OTHER)
Admission: RE | Admit: 2018-08-12 | Discharge: 2018-08-12 | Disposition: A | Discharge status: Home / Self Care | Payer: Medicare Other | Source: Ambulatory Visit | Attending: Cardiology | Admitting: Cardiology

## 2018-08-12 ENCOUNTER — Ambulatory Visit (INDEPENDENT_AMBULATORY_CARE_PROVIDER_SITE_OTHER): Payer: Medicare Other | Admitting: Cardiology

## 2018-08-12 ENCOUNTER — Ambulatory Visit (INDEPENDENT_AMBULATORY_CARE_PROVIDER_SITE_OTHER)
Admission: RE | Admit: 2018-08-12 | Discharge: 2018-08-12 | Disposition: A | Discharge status: Home / Self Care | Payer: Medicare Other | Source: Ambulatory Visit

## 2018-08-12 ENCOUNTER — Telehealth: Payer: Self-pay | Admitting: *Deleted

## 2018-08-12 ENCOUNTER — Encounter: Payer: Self-pay | Admitting: Cardiology

## 2018-08-12 VITALS — BP 114/82 | HR 80 | Ht 61.0 in | Wt 107.2 lb

## 2018-08-12 DIAGNOSIS — R0602 Shortness of breath: Secondary | ICD-10-CM | POA: Diagnosis not present

## 2018-08-12 DIAGNOSIS — M542 Cervicalgia: Secondary | ICD-10-CM | POA: Diagnosis not present

## 2018-08-12 DIAGNOSIS — R509 Fever, unspecified: Secondary | ICD-10-CM | POA: Diagnosis not present

## 2018-08-12 DIAGNOSIS — I200 Unstable angina: Secondary | ICD-10-CM | POA: Diagnosis not present

## 2018-08-12 DIAGNOSIS — R070 Pain in throat: Secondary | ICD-10-CM | POA: Diagnosis not present

## 2018-08-12 LAB — CBC
Hematocrit: 41.5 % (ref 34.0–46.6)
Hemoglobin: 13.7 g/dL (ref 11.1–15.9)
MCH: 27.4 pg (ref 26.6–33.0)
MCHC: 33 g/dL (ref 31.5–35.7)
MCV: 83 fL (ref 79–97)
Platelets: 351 10*3/uL (ref 150–450)
RBC: 5 x10E6/uL (ref 3.77–5.28)
RDW: 13.3 % (ref 12.3–15.4)
WBC: 6.8 10*3/uL (ref 3.4–10.8)

## 2018-08-12 LAB — BASIC METABOLIC PANEL
BUN/Creatinine Ratio: 13 (ref 9–23)
BUN: 9 mg/dL (ref 6–24)
CO2: 24 mmol/L (ref 20–29)
Calcium: 10.2 mg/dL (ref 8.7–10.2)
Chloride: 101 mmol/L (ref 96–106)
Creatinine, Ser: 0.71 mg/dL (ref 0.57–1.00)
GFR calc Af Amer: 121 mL/min/{1.73_m2} (ref >59)
GFR calc non Af Amer: 105 mL/min/{1.73_m2} (ref >59)
Glucose: 108 mg/dL — ABNORMAL HIGH (ref 65–99)
Potassium: 4.8 mmol/L (ref 3.5–5.2)
Sodium: 140 mmol/L (ref 134–144)

## 2018-08-12 LAB — C-REACTIVE PROTEIN: CRP: 2 mg/L (ref 0–10)

## 2018-08-12 LAB — HEPATIC FUNCTION PANEL
ALT: 12 IU/L (ref 0–32)
AST: 14 IU/L (ref 0–40)
Albumin: 4.4 g/dL (ref 3.5–5.5)
Alkaline Phosphatase: 100 IU/L (ref 39–117)
Bilirubin Total: 0.3 mg/dL (ref 0.0–1.2)
Bilirubin, Direct: 0.1 mg/dL (ref 0.00–0.40)
Total Protein: 6.6 g/dL (ref 6.0–8.5)

## 2018-08-12 MED ORDER — AZITHROMYCIN 250 MG PO TABS: ORAL_TABLET | ORAL | 0 refills | Status: DC

## 2018-08-12 MED ORDER — NITROGLYCERIN 0.4 MG SL SUBL: 0.4000 mg | SUBLINGUAL_TABLET | SUBLINGUAL | 3 refills | Status: DC | PRN

## 2018-08-12 NOTE — Patient Instructions (Addendum)
Medication Instructions:  Your physician recommends that you continue on your current medications as directed. Please refer to the Current Medication list given to you today.  If you need a refill on your cardiac medications before your next appointment, please call your pharmacy.   Lab work: TODAY: CBC, BMET, LFTs, CRP  If you have labs (blood work) drawn today and your tests are completely normal, you will receive your results only by: Marland Kitchen MyChart Message (if you have MyChart) OR . A paper copy in the mail If you have any lab test that is abnormal or we need to change your treatment, we will call you to review the results.  Testing/Procedures: Your physician recommends that you have a CT of your neck TODAY  Your physician recommends that you have a CT of your chest TODAY   Follow-Up: . Follow up with Dr. Meda Coffee in 3 weeks  Any Other Special Instructions Will Be Listed Below (If Applicable).

## 2018-08-12 NOTE — Progress Notes (Signed)
Cardiology Office Note    Date:  08/16/2018   ID:  Karen Hayden, DOB [birth year], MRN [id number]  PCP:  Karen Pi, MD  Cardiologist: Karen Dawley, MD EPS: None  No chief complaint on file.   History of Present Illness:  Karen Hayden is a 42 y.o. female with history of coronary vasospasm, no significant CAD on cardiac catheterization 01/2018, insulin-dependent diabetes mellitus, HTN, HLD, hypothyroidism, bipolar disorder and tobacco abuse.  Has had low blood pressures so can be started on CCB's or long-acting nitrates at discharge but Karen Dopp, PA-C started on isosorbide 02/25/2018.  She developed tachycardia after her first dose of isosorbide and called EMS and her heart rate was 140 bpm but normal sinus rhythm.  She saw Karen Dopp, PA-C back 03/25/2018 at which time her chest pain was less with decreasing caffeine and smoking.  Patient comes in today for increased palpitations. She lost her 109 year old daughter in Sept to an asthmatic/PE. She was kept on a ventilator for a couple days because she was an organ donor.She left behind a 12-year-old and 91-year-old that the patient and her other daughter trying to care for.  Patient extremely upset and crying in office.  Is not in any type of counseling or seen a therapist for this.  Having a lot of skipping, fluttering in chest and is worried. Also having some chest heaviness and pressure. Diagnosed with hyperthyroidism (previously hypothyroid) and started on methimazole.  She is due for thyroid check in the next 2 weeks.  08/12/2018 - the patient is very tearful over loss of her daughter. She continues to loose weight. She feels very tired. She has been coughing - productive with fever chills x 3 days, symptoms of neck pain and fullness. No LE edema. Continues to have palpitations.   Past Medical History:  Diagnosis Date  . Bipolar 1 disorder ([place])   . Chronic kidney infection   . Diabetic gastroparesis ([place])   . DM (diabetes  mellitus) ([place])   . Gastroparesis    Due to diabetes   . Hyperlipidemia   . Hypertension   . Hypothyroidism    Has thyroiditis with subsequent hyperthyroidism so off Synthroid  . Seizure disorder ([place])   . Umbilical hernia     Past Surgical History:  Procedure Laterality Date  . LEFT HEART CATH AND CORONARY ANGIOGRAPHY N/A 02/18/2018   Procedure: LEFT HEART CATH AND CORONARY ANGIOGRAPHY;  Surgeon: Karen Booze, MD;  Location: [facility];  Service: Cardiovascular;  Laterality: N/A;  . TONSILLECTOMY    . VAGINAL HYSTERECTOMY     left ovary removed , has her right ovary    Current Medications: Current Meds  Medication Sig  . ibuprofen (ADVIL,MOTRIN) 200 MG tablet Take 800 mg by mouth as needed for moderate pain.  Marland Kitchen insulin lispro (HUMALOG) 100 UNIT/ML cartridge Inject into the skin 4 (four) times daily. 1 unit per 10gm of Carbs. Counts carbs with her meals to get dosage  . insulin NPH Human (HUMULIN N,NOVOLIN N) 100 UNIT/ML injection Inject 11 Units into the skin 2 (two) times daily.  . INSULIN SYRINGE .5CC/28G (INS SYRINGE/NEEDLE .5CC/28G) 28G X 1/2" 0.5 ML MISC Use as instructed to administer insulin  . methimazole (TAPAZOLE) 5 MG tablet Take 5 mg by mouth daily.  . metoprolol succinate (TOPROL-XL) 25 MG 24 hr tablet Take 0.5 tablets (12.5 mg total) by mouth daily.  . nitroGLYCERIN (NITROSTAT) 0.4 MG SL tablet Place 1 tablet (0.4 mg total) under  the tongue every 5 (five) minutes as needed for chest pain.  . promethazine (PHENERGAN) 25 MG suppository Place 1 suppository (25 mg total) rectally every 6 (six) hours as needed for nausea or vomiting.  . promethazine (PHENERGAN) 25 MG tablet Take 1 tablet (25 mg total) by mouth every 6 (six) hours as needed for nausea or vomiting.  . [DISCONTINUED] nitroGLYCERIN (NITROSTAT) 0.4 MG SL tablet Place 1 tablet (0.4 mg total) under the tongue every 5 (five) minutes as needed for chest pain.     Allergies:   Lipitor [atorvastatin];  Codeine; and Isosorbide   Social History   Socioeconomic History  . Marital status: Single    Spouse name: Not on file  . Number of children: 2  . Years of education: Not on file  . Highest education level: Not on file  Occupational History  . 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 Financial resource strain: Not on file  . Food insecurity:    Worry: Not on file    Inability: Not on file  . Transportation needs:    Medical: Not on file    Non-medical: Not on file  Tobacco Use  . Smoking status: Current Every Day Smoker    Packs/day: 1.00    Years: 13.00    Pack years: 13.00    Types: Cigarettes  . Smokeless tobacco: Never Used  . Tobacco comment: Tobacco info given  Substance and Sexual Activity  . Alcohol use: Yes    Comment: occasional  . 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 Sexual activity: Yes    Birth control/protection: Surgical  Lifestyle  . Physical activity:    Days per week: Not on file    Minutes per session: Not on file  . Stress: Not on file  Relationships  . Social connections:    Talks on phone: Not on file    Gets together: Not on file    Attends religious service: Not on file    Active member of club or organization: Not on file    Attends meetings of clubs or organizations: Not on file    Relationship status: Not on file  Other Topics Concern  . Not on file  Social History Narrative  . Not on file     Family History:  The patient's family history includes Diabetes in her mother; Heart attack in her mother; Ovarian cancer in her paternal grandmother.   ROS:   Please see the history of present illness.    Review of Systems  Constitution: Positive for decreased appetite.  HENT: Negative.   Eyes: Negative.   Cardiovascular: Positive for chest pain, irregular heartbeat and palpitations.  Respiratory: Negative.   Hematologic/Lymphatic: Negative.   Musculoskeletal:  Negative.  Negative for joint pain.  Gastrointestinal: Negative.   Genitourinary: Negative.   Neurological: Positive for headaches.  Psychiatric/Behavioral: Positive for depression. The patient is nervous/anxious.    All other systems reviewed and are negative.   PHYSICAL EXAM:   VS:  BP 114/82   Pulse 80   Ht 5\' 1"  (1.549 m)   Wt 107 lb 3.2 oz (48.6 kg)   SpO2 99%   BMI 20.26 kg/m   Physical Exam  GEN: Thin, in no acute distress  Neck: no JVD, carotid bruits, or masses Cardiac:RRR; no murmurs, rubs, or gallops  Respiratory:  clear to auscultation bilaterally, normal work of  breathing GI: soft, nontender, nondistended, + BS Ext: without cyanosis, clubbing, or edema, Good distal pulses bilaterally Neuro:  Alert and Oriented x 3 Psych: Extremely anxious, tearful, grieving  Wt Readings from Last 3 Encounters:  08/12/18 107 lb 3.2 oz (48.6 kg)  07/06/18 108 lb (49 kg)  07/06/18 111 lb 6.4 oz (50.5 kg)      Studies/Labs Reviewed:   EKG:  EKG is ordered today.  The ekg ordered today demonstrates normal sinus rhythm, no acute change  Recent Labs: 02/17/2018: TSH 0.141 08/12/2018: ALT 12; BUN 9; Creatinine, Ser 0.71; Hemoglobin 13.7; Platelets 351; Potassium 4.8; Sodium 140   Lipid Panel    Component Value Date/Time   CHOL 129 02/18/2018 0310   TRIG 140 02/18/2018 0310   HDL 38 (L) 02/18/2018 0310   CHOLHDL 3.4 02/18/2018 0310   VLDL 28 02/18/2018 0310   LDLCALC 63 02/18/2018 0310    Additional studies/ records that were reviewed today include:  2D echo 6/2019Study Conclusions   - Left ventricle: The cavity size was normal. Systolic function was   normal. The estimated ejection fraction was in the range of 55%   to 60%. Wall motion was normal; there were no regional wall   motion abnormalities. Left ventricular diastolic function   parameters were normal. - Mitral valve: There was trivial regurgitation. - Left atrium: The atrium was moderately dilated. Volume/bsa,  ES   (1-plane Simpson&'s, A4C): 39.5 ml/m^2.    Cardiac catheterization 6/21/2019Prox RCA lesion is 10% stenosed. This may have been catheter induced spasm.  The left ventricular systolic function is normal.  LV end diastolic pressure is normal.  The left ventricular ejection fraction is 55-65% by visual estimate.  There is no aortic valve stenosis.   Continue preventive therapy.  Smoking cessation.  If sx persist, consider medical therapy for vasospasm.         ASSESSMENT:    1. Neck pain   2. Fever, unspecified fever cause   3. Chills with fever   4. SOB (shortness of breath)      PLAN:  In order of problems listed above:  Palpitations - normal 30 day event monitor, symptoms dont correlate, she is also on therapy with Methimazole.  Coronary spasm suspected with history of chest pain and nonobstructive CAD on cath in 01/2018  Fever/chills/cough, neck fullness - normal CT neck/chest today, we will start Z pack for presumed URI.  Acute posttraumatic stress disorder secondary to the sudden loss of her 67 year old daughter.  She is extremely depressed, she needs to find a good therapist.  Medication Adjustments/Labs and Tests Ordered: Current medicines are reviewed at length with the patient today.  Concerns regarding medicines are outlined above.  Medication changes, Labs and Tests ordered today are listed in the Patient Instructions below. Patient Instructions  Medication Instructions:  Your physician recommends that you continue on your current medications as directed. Please refer to the Current Medication list given to you today.  If you need a refill on your cardiac medications before your next appointment, please call your pharmacy.   Lab work: TODAY: CBC, BMET, LFTs, CRP  If you have labs (blood work) drawn today and your tests are completely normal, you will receive your results only by: Marland Kitchen MyChart Message (if you have MyChart) OR . A paper copy in the  mail If you have any lab test that is abnormal or we need to change your treatment, we will call you to review the results.  Testing/Procedures: Your physician  recommends that you have a CT of your neck TODAY  Your physician recommends that you have a CT of your chest TODAY   Follow-Up: . Follow up with Dr. Meda Coffee in 3 weeks  Any Other Special Instructions Will Be Listed Below (If Applicable).       Signed, Karen Dawley, MD  08/16/2018 12:04 AM    [address], [place], [place]  [address] Phone: [phone]; Fax: [phone]

## 2018-08-12 NOTE — Telephone Encounter (Signed)
-----   Message from Dorothy Spark, MD sent at 08/12/2018  4:53 PM EST ----- Normal chest and neck CT, normal kidney, liver function, normal CBC and CRP. However I would start her on Z -pack for presumed acute bronchitis. Please have her follow with her PCP in 1 week to see if her symptoms have improved, she also needs to be evaluated for depression by either a PCP or a therapist.

## 2018-08-12 NOTE — Telephone Encounter (Signed)
Pt notified. She recently had first appointment with Dr. Cheryln Manly.  [facility] [address] [facility]

## 2018-08-16 ENCOUNTER — Ambulatory Visit (INDEPENDENT_AMBULATORY_CARE_PROVIDER_SITE_OTHER): Payer: Medicare Other | Admitting: Psychology

## 2018-08-16 DIAGNOSIS — F339 Major depressive disorder, recurrent, unspecified: Secondary | ICD-10-CM | POA: Diagnosis not present

## 2018-08-26 ENCOUNTER — Ambulatory Visit (INDEPENDENT_AMBULATORY_CARE_PROVIDER_SITE_OTHER): Payer: Medicare Other | Admitting: Psychology

## 2018-08-26 DIAGNOSIS — F339 Major depressive disorder, recurrent, unspecified: Secondary | ICD-10-CM | POA: Diagnosis not present

## 2018-09-08 ENCOUNTER — Encounter: Payer: Self-pay | Admitting: Cardiology

## 2018-09-08 ENCOUNTER — Ambulatory Visit (INDEPENDENT_AMBULATORY_CARE_PROVIDER_SITE_OTHER): Payer: Medicare Other | Admitting: Cardiology

## 2018-09-08 VITALS — BP 114/72 | HR 69 | Ht 61.0 in | Wt 110.8 lb

## 2018-09-08 DIAGNOSIS — I2510 Atherosclerotic heart disease of native coronary artery without angina pectoris: Secondary | ICD-10-CM

## 2018-09-08 DIAGNOSIS — E0590 Thyrotoxicosis, unspecified without thyrotoxic crisis or storm: Secondary | ICD-10-CM | POA: Diagnosis not present

## 2018-09-08 DIAGNOSIS — R002 Palpitations: Secondary | ICD-10-CM

## 2018-09-08 MED ORDER — METOPROLOL TARTRATE 25 MG PO TABS: 12.5000 mg | ORAL_TABLET | Freq: Two times a day (BID) | ORAL | 1 refills | Status: DC

## 2018-09-08 NOTE — Progress Notes (Signed)
Cardiology Office Note    Date:  09/08/2018   ID:  Karen Hayden 1976/03/07, MRN [id number]  PCP:  Jacelyn Pi, MD  Cardiologist: Ena Dawley, MD EPS: None  No chief complaint on file.   History of Present Illness:  Karen Hayden is a 43 y.o. female with history of coronary vasospasm, no significant CAD on cardiac catheterization 01/2018, insulin-dependent diabetes mellitus, HTN, HLD, hypothyroidism, bipolar disorder and tobacco abuse.  Has had low blood pressures so can be started on CCB's or long-acting nitrates at discharge but [name], PA-C started on isosorbide 02/25/2018.  She developed tachycardia after her first dose of isosorbide and called EMS and her heart rate was 140 bpm but normal sinus rhythm.  She saw [name], PA-C back 03/25/2018 at which time her chest pain was less with decreasing caffeine and smoking.  Patient comes in today for increased palpitations. She lost her 30 year old daughter in Sept to an asthmatic/PE. She was kept on a ventilator for a couple days because she was an organ donor.She left behind a 66-year-old and 9-year-old that the patient and her other daughter trying to care for.  Patient extremely upset and crying in office.  Is not in any type of counseling or seen a therapist for this.  Having a lot of skipping, fluttering in chest and is worried. Also having some chest heaviness and pressure. Diagnosed with hyperthyroidism (previously hypothyroid) and started on methimazole.  She is due for thyroid check in the next 2 weeks.  08/12/2018 - the patient is very tearful over loss of her daughter. She continues to loose weight. She feels very tired. She has been coughing - productive with fever chills x 3 days, symptoms of neck pain and fullness. No LE edema. Continues to have palpitations.   09/08/2018 -the patient is coming after 1 months, she states that she feels significantly better, azithromycin help with her bronchitis and her neck pain has  resolved as well.  CT of her soft tissue in the neck showed no mass.  Most of her symptoms have resolved except for palpitations.  She is being followed by Dr. Chalmers Cater for hyperthyroidism and continues to take methimazole.  She was never given Toprol that was prescribed.  She has palpitations daily.  She now sees a therapist that helps her with her grief from losing her daughter in September.  She is a primary caregiver for her grandchildren now age 82 and 39.  Past Medical History:  Diagnosis Date  . Bipolar 1 disorder ([place])   . Chronic kidney infection   . Diabetic gastroparesis ([place])   . DM (diabetes mellitus) ([place])   . Gastroparesis    Due to diabetes   . Hyperlipidemia   . Hypertension   . Hypothyroidism    Has thyroiditis with subsequent hyperthyroidism so off Synthroid  . Seizure disorder ([place])   . Umbilical hernia    Past Surgical History:  Procedure Laterality Date  . LEFT HEART CATH AND CORONARY ANGIOGRAPHY N/A 02/18/2018   Procedure: LEFT HEART CATH AND CORONARY ANGIOGRAPHY;  Surgeon: Jettie Booze, MD;  Location: [facility];  Service: Cardiovascular;  Laterality: N/A;  . TONSILLECTOMY    . VAGINAL HYSTERECTOMY     left ovary removed , has her right ovary   Current Medications: Current Meds  Medication Sig  . azithromycin (ZITHROMAX Z-PAK) 250 MG tablet Take as directed.  Marland Kitchen ibuprofen (ADVIL,MOTRIN) 200 MG tablet Take 800 mg by mouth as needed  for moderate pain.  Marland Kitchen insulin lispro (HUMALOG) 100 UNIT/ML cartridge Inject into the skin 4 (four) times daily. 1 unit per 10gm of Carbs. Counts carbs with her meals to get dosage  . insulin NPH Human (HUMULIN N,NOVOLIN N) 100 UNIT/ML injection Inject 11 Units into the skin 2 (two) times daily.  . INSULIN SYRINGE .5CC/28G (INS SYRINGE/NEEDLE .5CC/28G) 28G X 1/2" 0.5 ML MISC Use as instructed to administer insulin  . methimazole (TAPAZOLE) 5 MG tablet Take 5 mg by mouth daily.  . nitroGLYCERIN (NITROSTAT) 0.4 MG SL tablet  Place 1 tablet (0.4 mg total) under the tongue every 5 (five) minutes as needed for chest pain.  . promethazine (PHENERGAN) 25 MG tablet Take 1 tablet (25 mg total) by mouth every 6 (six) hours as needed for nausea or vomiting.    Allergies:   Lipitor [atorvastatin]; Codeine; and Isosorbide   Social History   Socioeconomic History  . Marital status: Single    Spouse name: Not on file  . Number of children: 2  . Years of education: Not on file  . Highest education level: Not on file  Occupational History  . 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 Financial resource strain: Not on file  . Food insecurity:    Worry: Not on file    Inability: Not on file  . Transportation needs:    Medical: Not on file    Non-medical: Not on file  Tobacco Use  . Smoking status: Former Smoker    Packs/day: 1.00    Years: 13.00    Pack years: 13.00    Types: Cigarettes    Last attempt to quit: 02/17/2018    Years since quitting: 0.5  . Smokeless tobacco: Never Used  . Tobacco comment: Tobacco info given  Substance and Sexual Activity  . Alcohol use: Yes    Comment: occasional  . 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 Sexual activity: Yes    Birth control/protection: Surgical  Lifestyle  . Physical activity:    Days per week: Not on file    Minutes per session: Not on file  . Stress: Not on file  Relationships  . Social connections:    Talks on phone: Not on file    Gets together: Not on file    Attends religious service: Not on file    Active member of club or organization: Not on file    Attends meetings of clubs or organizations: Not on file    Relationship status: Not on file  Other Topics Concern  . Not on file  Social History Narrative  . Not on file     Family History:  The patient's family history includes Diabetes in her mother; Heart attack in her mother; Ovarian cancer in her paternal grandmother.    ROS:   Please see the history of present illness.    Review of Systems  Constitution: Positive for decreased appetite.  HENT: Negative.   Eyes: Negative.   Cardiovascular: Positive for chest pain, irregular heartbeat and palpitations.  Respiratory: Negative.   Hematologic/Lymphatic: Negative.   Musculoskeletal: Negative.  Negative for joint pain.  Gastrointestinal: Negative.   Genitourinary: Negative.   Neurological: Positive for headaches.  Psychiatric/Behavioral: Positive for depression. The patient is nervous/anxious.    All other systems reviewed and are negative.  PHYSICAL EXAM:   VS:  BP 114/72  Pulse 69   Ht 5\' 1"  (1.549 m)   Wt 110 lb 12.8 oz (50.3 kg)   SpO2 99%   BMI 20.94 kg/m   Physical Exam  GEN: Thin, in no acute distress  Neck: no JVD, carotid bruits, or masses Cardiac:RRR; no murmurs, rubs, or gallops  Respiratory:  clear to auscultation bilaterally, normal work of breathing GI: soft, nontender, nondistended, + BS Ext: without cyanosis, clubbing, or edema, Good distal pulses bilaterally Neuro:  Alert and Oriented x 3  Wt Readings from Last 3 Encounters:  09/08/18 110 lb 12.8 oz (50.3 kg)  08/12/18 107 lb 3.2 oz (48.6 kg)  07/06/18 108 lb (49 kg)     Studies/Labs Reviewed:   EKG:  EKG is ordered today.  The ekg ordered today demonstrates normal sinus rhythm, no acute change  Recent Labs: 02/17/2018: TSH 0.141 08/12/2018: ALT 12; BUN 9; Creatinine, Ser 0.71; Hemoglobin 13.7; Platelets 351; Potassium 4.8; Sodium 140   Lipid Panel    Component Value Date/Time   CHOL 129 02/18/2018 0310   TRIG 140 02/18/2018 0310   HDL 38 (L) 02/18/2018 0310   CHOLHDL 3.4 02/18/2018 0310   VLDL 28 02/18/2018 0310   LDLCALC 63 02/18/2018 0310    Additional studies/ records that were reviewed today include:  2D echo 6/2019Study Conclusions   - Left ventricle: The cavity size was normal. Systolic function was   normal. The estimated ejection fraction was in  the range of 55%   to 60%. Wall motion was normal; there were no regional wall   motion abnormalities. Left ventricular diastolic function   parameters were normal. - Mitral valve: There was trivial regurgitation. - Left atrium: The atrium was moderately dilated. Volume/bsa, ES   (1-plane Simpson&'s, A4C): 39.5 ml/m^2.    Cardiac catheterization 6/21/2019Prox RCA lesion is 10% stenosed. This may have been catheter induced spasm.  The left ventricular systolic function is normal.  LV end diastolic pressure is normal.  The left ventricular ejection fraction is 55-65% by visual estimate.  There is no aortic valve stenosis.   Continue preventive therapy.  Smoking cessation.  If sx persist, consider medical therapy for vasospasm.     ASSESSMENT:    1. Coronary artery disease involving native coronary artery of native heart without angina pectoris   2. Palpitations   3. Hyperthyroidism     PLAN:  In order of problems listed above:  Palpitations - normal 30 day event monitor, related to hyperthyroidism, will start metoprolol 12.5 mg p.o. twice daily  Coronary spasm suspected with history of chest pain and nonobstructive CAD on cath in 01/2018, will check lipids at the next visit and consider starting statin if needed.  Hyperthyroidism -followed by her primary care physician.  Acute posttraumatic stress disorder secondary to the sudden loss of her 65 year old daughter.  She is feeling better with the help of therapist.  Medication Adjustments/Labs and Tests Ordered: Current medicines are reviewed at length with the patient today.  Concerns regarding medicines are outlined above.  Medication changes, Labs and Tests ordered today are listed in the Patient Instructions below. Patient Instructions  Medication Instructions:   STOP TAKING TOPROL XL  START TAKING METOPROLOL TARTRATE 12.5 MG BY MOUTH TWICE DAILY  If you need a refill on your cardiac medications before your next  appointment, please call your pharmacy.       Follow-Up:  3 MONTHS WITH DR Kate Sable, Ena Dawley, MD  09/08/2018 11:34 AM  [facility] [address], [place], [place]  [address] Phone: [phone]; Fax: [phone]

## 2018-09-08 NOTE — Patient Instructions (Signed)
Medication Instructions:   STOP TAKING TOPROL XL  START TAKING METOPROLOL TARTRATE 12.5 MG BY MOUTH TWICE DAILY  If you need a refill on your cardiac medications before your next appointment, please call your pharmacy.       Follow-Up:  3 MONTHS WITH DR Meda Coffee

## 2018-09-12 ENCOUNTER — Ambulatory Visit (INDEPENDENT_AMBULATORY_CARE_PROVIDER_SITE_OTHER): Payer: Medicare Other | Admitting: Psychology

## 2018-09-12 DIAGNOSIS — F339 Major depressive disorder, recurrent, unspecified: Secondary | ICD-10-CM

## 2018-09-16 ENCOUNTER — Ambulatory Visit
Admission: RE | Admit: 2018-09-16 | Discharge: 2018-09-16 | Disposition: A | Discharge status: Home / Self Care | Payer: Medicare Other | Source: Ambulatory Visit | Attending: Endocrinology | Admitting: Endocrinology

## 2018-09-16 ENCOUNTER — Other Ambulatory Visit: Payer: Self-pay | Admitting: Endocrinology

## 2018-09-16 DIAGNOSIS — E0590 Thyrotoxicosis, unspecified without thyrotoxic crisis or storm: Secondary | ICD-10-CM

## 2018-09-19 ENCOUNTER — Ambulatory Visit (INDEPENDENT_AMBULATORY_CARE_PROVIDER_SITE_OTHER): Payer: Medicare Other | Admitting: Psychiatry

## 2018-09-19 ENCOUNTER — Encounter: Payer: Self-pay | Admitting: Psychiatry

## 2018-09-19 VITALS — BP 166/94 | HR 90 | Ht 61.0 in | Wt 108.0 lb

## 2018-09-19 DIAGNOSIS — F5101 Primary insomnia: Secondary | ICD-10-CM

## 2018-09-19 DIAGNOSIS — F3163 Bipolar disorder, current episode mixed, severe, without psychotic features: Secondary | ICD-10-CM | POA: Diagnosis not present

## 2018-09-19 DIAGNOSIS — F419 Anxiety disorder, unspecified: Secondary | ICD-10-CM

## 2018-09-19 MED ORDER — OLANZAPINE 5 MG PO TABS: ORAL_TABLET | ORAL | 1 refills | Status: DC

## 2018-09-19 NOTE — Progress Notes (Signed)
[facility] MD/PA/NP Initial Note  09/20/2018 5:29 PM Karen Hayden  MRN:  [id number]  Chief Complaint:  Anxiety, Depression, Insomnia, Irritability, Decreased Appetite HPI: Pt is a 43 yo female seen for initial evaluation for anxiety, depression, insomnia, irritability, and decreased appetite. She reports that these s/s began when her 21 yo daughter died on 06-02-2018. Daughter was on life support for 8 days. Has intrusive memories and images, as well as re-experiencing negative events about death of daughter and also past traumatic events.   Reports that she has lost 27 lbs and has no appetite. She reports that she was previously sleeping all the time and now cannot sleep. Estimates sleeping about 2 hours a night. Difficulty with sleep initiation and maintenance. Reports that she worries about her deceased child being cold and wet at the [facility] and will want to take an umbrella and coat to her. Reports that she has increased anxiety with reminders of her daughter, such as seeing a car like the one her daughter drove. Reports exaggerated startle response and hypervigilance. Has been picking at her skin. Reports crying uncontrollably. Reports frequent worry. Denies anxiety prior to September.  Reports that she has severe anxiety in the car and that having her dog with her helps with her anxiety. She reports that she will get SOB, feels as if she cannot get enough breath in, chest heaviness, and that she is about to die daily and sometimes multiple times a day. Has had a strong need to flee at times and has walked out of what she was doing at work. Cardiologist said cardiac w/u was negative.   Reports that she is very irritable and has not experienced this before. Mood has been persistently depressed. She reports that prior to September she was a "happy, upbeat person." She reports that she has had some racing thoughts and that her thoughts will jump to different things. Reports that her energy and  motivation have been very low. She reports difficulty with concentration and sustaining focus. Denies SI. Reports passive death wishes.   Reports suicide attempt by trying to drown herself in 12-10-00. Denies any other suicide attempts. Denies SIB.   She reports increased interest in sex. Reports some impulsive and risky behavior. Denies excessive spending. Reports that she has days where she is excessively talkative and other days is more withdrawn. Reports that she has had periods of elevated mood in the past. Denies any periods of decreased need for sleep. Reports that she has periods of increased energy and goal-directed activity where she took on a large number of tasks. She reports experiencing these s/s more frequently in the last 4 months. She reports that in the past "I would just deal with it" and denies it interfering with relationships or functioning. Reports it would usually last 1.5 days about twice a month.  Had some depression in 12-11-99 after the death of her mother. Saw Dr. Sheralyn Boatman at that time and was prescribed medication.   Denies AH or VH. Denies paranoia.  Born in [place] and grew up in [place]. She is an only child. "My childhood with my mom was fantastic, my father beat [place] today." Father now lives in [place] and does not have a relationship with him. Parents remained married. She moved out at age 29. Has a college degree. Has worked as a Theme park manager. She is now raising her 25 yo granddaughter and 41 yo grandson and reports that they are "struggling."  Has another daughter  that is 35 yo. Daughter recently moved in with her to help. On disability for DM type I. Divorced 5 years ago and then had to return to work some limited hours to sustain herself financially after not working 12 years. She and her husband were married 50 years when they divorced. Reports that the marriage was "really good until the end."  Husband was an alcoholic and started drinking again.  Husband was father of her children and they maintain an amicable relationship. Had partial hysterectomy at age 74. Was not able to work at all in December. Currently in a relationship for almost 3 years which was "good until my daughter passed away" and describes her irritability and emotions interfering with the relationship. Reports that her boyfriend and daughter are main supports. Her Milana Obey is a source of emotional. Denies having any hobbies or interests.    Visit Diagnosis:    ICD-10-CM   1. Bipolar disorder, current episode mixed, severe, without psychotic features ([place]) F31.63 OLANZapine (ZYPREXA) 5 MG tablet  2. Anxiety disorder, unspecified type F41.9 OLANZapine (ZYPREXA) 5 MG tablet  3. Primary insomnia F51.01 OLANZapine (ZYPREXA) 5 MG tablet    Past Psychiatric History: Reports that she saw Dr. Sheralyn Boatman for several years starting in 2001. She reports that she saw a therapist at that time. Currently seeing Dr. Cheryln Manly every other week since [place]. Reports hospitalization at [facility] for suicide attempt in 2002.   Past Psychiatric Medication Trials: Lexapro- sexual side effects. Helped with depressive episode. Prozac- "I liked Prozac." Effexor- took around 2001 Gabapentin- was prescribed for neuropathy and was effective. Was also prescribed TID by Dr. Caprice Beaver.  Lamictal- Prescribed by Dr. Caprice Beaver. Was helpful for racing thoughts. Depakote- Was prescribed 1500 mg po QHS. Drowsiness. Olanzapine- "calmed me down." Denies excessive somnolence. Geodon- Excessive drowsiness. Risperdal- Prescribed during hospitalization. Some somnolence. Seroquel- Had a seizure.  Buspar- Effective and well tolerated.  Klonopin- Caused her to have nightmares. Lunesta- More effective than Ambien Ambien Trazodone- Sleep paralysis  Not certain if she was ever on Abilify.  Past Medical History:  Past Medical History:  Diagnosis Date  . Bipolar 1 disorder ([place])   . Chronic kidney  infection   . Diabetes mellitus type I ([place])   . Diabetic gastroparesis ([place])   . DM (diabetes mellitus) ([place])   . Gastroparesis    Due to diabetes   . Hyperlipidemia   . Hypertension   . Hypothyroidism    Has thyroiditis with subsequent hyperthyroidism so off Synthroid  . Seizure disorder ([place])   . Umbilical hernia     Past Surgical History:  Procedure Laterality Date  . ADENOIDECTOMY    . LEFT HEART CATH AND CORONARY ANGIOGRAPHY N/A 02/18/2018   Procedure: LEFT HEART CATH AND CORONARY ANGIOGRAPHY;  Surgeon: Jettie Booze, MD;  Location: [facility];  Service: Cardiovascular;  Laterality: N/A;  . TONSILLECTOMY    . VAGINAL HYSTERECTOMY     left ovary removed , has her right ovary     Family History:  Family History  Problem Relation Age of Onset  . Diabetes Mother   . Heart attack Mother   . Depression Mother   . Anxiety disorder Mother   . Ovarian cancer Paternal Grandmother        great grandmother  . Diabetes Paternal Grandmother   . Mood Disorder Father   . Depression Maternal Grandmother   . Anxiety disorder Maternal Grandmother   . Goiter Maternal Grandmother   . Diabetes Maternal  Grandmother   . Depression Paternal Aunt   . Anxiety disorder Paternal Aunt   . Diabetes Paternal Aunt   . Diabetes Paternal Aunt   . Colon cancer Neg Hx     Social History:  Social History   Socioeconomic History  . Marital status: Single    Spouse name: Not on file  . Number of children: 2  . Years of education: Not on file  . Highest education level: Not on file  Occupational History  . 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 Financial resource strain: Not on file  . Food insecurity:    Worry: Not on file    Inability: Not on file  . Transportation needs:    Medical: Not on file    Non-medical: Not on file  Tobacco Use  . Smoking status: Former Smoker    Packs/day: 1.00    Years: 13.00    Pack years: 13.00    Types:  Cigarettes    Last attempt to quit: 02/17/2018    Years since quitting: 0.5  . Smokeless tobacco: Never Used  . Tobacco comment: Tobacco info given  Substance and Sexual Activity  . Alcohol use: Not Currently  . 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 Sexual activity: Yes    Birth control/protection: Surgical  Lifestyle  . Physical activity:    Days per week: Not on file    Minutes per session: Not on file  . Stress: Not on file  Relationships  . Social connections:    Talks on phone: Not on file    Gets together: Not on file    Attends religious service: Not on file    Active member of club or organization: Not on file    Attends meetings of clubs or organizations: Not on file    Relationship status: Not on file  Other Topics Concern  . Not on file  Social History Narrative  . Not on file    Allergies:  Allergies  Allergen Reactions  . Seroquel [Quetiapine Fumarate]     Seizure  . Lipitor [Atorvastatin]     Attacks joints, inflamation  . Codeine Rash  . Isosorbide Palpitations    Rapid HR after 1st dose - ?hypotension with rebound tachy / she tolerates prn SL NTG just fine    Metabolic Disorder Labs: Lab Results  Component Value Date   HGBA1C 7.7 (H) 02/17/2018   MPG 174.29 02/17/2018   MPG 214.47 05/08/2017   No results found for: PROLACTIN Lab Results  Component Value Date   CHOL 129 02/18/2018   TRIG 140 02/18/2018   HDL 38 (L) 02/18/2018   CHOLHDL 3.4 02/18/2018   VLDL 28 02/18/2018   LDLCALC 63 02/18/2018   Lab Results  Component Value Date   TSH 0.141 (L) 02/17/2018   TSH 0.011 (L) 05/05/2017    Therapeutic Level Labs: No results found for: LITHIUM No results found for: VALPROATE No components found for:  CBMZ  Current Medications: Current Outpatient Medications  Medication Sig Dispense Refill  . ibuprofen (ADVIL,MOTRIN) 200 MG tablet Take 800 mg by mouth as needed for moderate pain.    Marland Kitchen insulin  lispro (HUMALOG) 100 UNIT/ML cartridge Inject into the skin 4 (four) times daily. 1 unit per 10gm of Carbs. Counts carbs with her meals to get dosage    . insulin NPH Human (HUMULIN N,NOVOLIN N)  100 UNIT/ML injection Inject 11 Units into the skin 2 (two) times daily.    . metoCLOPramide (REGLAN) 5 MG tablet Take 5 mg by mouth 4 (four) times daily.    . promethazine (PHENERGAN) 25 MG tablet Take 1 tablet (25 mg total) by mouth every 6 (six) hours as needed for nausea or vomiting. 12 tablet 0  . azithromycin (ZITHROMAX Z-PAK) 250 MG tablet Take as directed. (Patient not taking: Reported on 09/19/2018) 5 each 0  . INSULIN SYRINGE .5CC/28G (INS SYRINGE/NEEDLE .5CC/28G) 28G X 1/2" 0.5 ML MISC Use as instructed to administer insulin 60 each 1  . methimazole (TAPAZOLE) 5 MG tablet Take 5 mg by mouth daily.    . metoprolol tartrate (LOPRESSOR) 25 MG tablet Take 0.5 tablets (12.5 mg total) by mouth 2 (two) times daily. (Patient not taking: Reported on 09/19/2018) 180 tablet 1  . nitroGLYCERIN (NITROSTAT) 0.4 MG SL tablet Place 1 tablet (0.4 mg total) under the tongue every 5 (five) minutes as needed for chest pain. 30 tablet 3  . OLANZapine (ZYPREXA) 5 MG tablet Take 1/2 tab po QHS for 5-7 nights, then may increase to 1 tab po QHS as tolerated. 30 tablet 1   No current facility-administered medications for this visit.     Medication Side Effects: none  Orders placed this visit:  No orders of the defined types were placed in this encounter.   Psychiatric Specialty Exam:  Review of Systems  Constitutional: Positive for malaise/fatigue and weight loss.  Cardiovascular: Positive for chest pain and palpitations.  Gastrointestinal: Positive for nausea.  Musculoskeletal: Positive for back pain.  Neurological: Positive for headaches.  Endo/Heme/Allergies:       Cold intolerance  Psychiatric/Behavioral: Positive for depression. The patient is nervous/anxious and has insomnia.     Blood pressure (!)  166/94, pulse 90, height 5\' 1"  (1.549 m), weight 108 lb (49 kg).Body mass index is 20.41 kg/m.  General Appearance: Casual  Eye Contact:  Good  Speech:  Clear and Coherent and Talkative  Volume:  Normal  Mood:  Anxious, Depressed and Dysphoric  Affect:  Labile  Thought Process:  Coherent  Orientation:  Full (Time, Place, and Person)  Thought Content: WDL   Suicidal Thoughts:  No  Homicidal Thoughts:  No  Memory:  WNL  Judgement:  Fair  Insight:  Fair  Psychomotor Activity:  Increased  Concentration:  Concentration: Fair  Recall:  Good  Fund of Knowledge: Good  Language: Good  Assets:  Communication Skills Desire for Improvement  ADL's:  Intact  Cognition: WNL  Prognosis:  Good   Screenings:   Receiving Psychotherapy: Yes   Treatment Plan/Recommendations:  Pt seen for 60 minutes and greater than 50% of visit spent counseling pt re: mood and anxiety s/s. Discussed that she is experiencing some PTSD-like s/s in response to the death of her daughter. Discussed that high anxiety and grief likely interfered with her sleep and then exacerbated pre-existing mood d/o. Discussed s/s of depression and hypomania and that based on her report, she is likely having a current mixed episode. Discussed potential benefits, risks, and side effects of several tx options to include Olanzapine, Buspar, and Lamictal. Discussed potential metabolic side effects associated with atypical antipsychotics, as well as potential risk for movement side effects. Advised pt to contact office if movement side effects occur. Pt reports that she would like to start with trial of Olanzapine since she has tolerated it well in the past and that it could potentially target several of  her current chief complaints to include: insomnia, anxiety, mood instability, and decreased appetite. Pt reports that she prefers to start only one medication at this time and then consider starting additional medications if needed. Will start  Olanzapine 5 mg 1/2 tab po QHS x 1 week, then increase to 1 tab po QHS as tolerated. Discussed notifying office if she experiences increase in glucose levels. Recommend continuing to see Dr. Cheryln Manly for psychotherapy. Pt to f/u with this provider in 3-4 weeks or sooner if clinically indicated.      Thayer Headings, PMHNP

## 2018-09-20 ENCOUNTER — Encounter: Payer: Self-pay | Admitting: Psychiatry

## 2018-09-28 ENCOUNTER — Ambulatory Visit (INDEPENDENT_AMBULATORY_CARE_PROVIDER_SITE_OTHER): Payer: Medicare Other | Admitting: Psychology

## 2018-09-28 DIAGNOSIS — F339 Major depressive disorder, recurrent, unspecified: Secondary | ICD-10-CM | POA: Diagnosis not present

## 2018-10-11 ENCOUNTER — Other Ambulatory Visit: Payer: Self-pay | Admitting: Psychiatry

## 2018-10-11 DIAGNOSIS — F3163 Bipolar disorder, current episode mixed, severe, without psychotic features: Secondary | ICD-10-CM

## 2018-10-11 DIAGNOSIS — F419 Anxiety disorder, unspecified: Secondary | ICD-10-CM

## 2018-10-11 DIAGNOSIS — F5101 Primary insomnia: Secondary | ICD-10-CM

## 2018-10-12 ENCOUNTER — Encounter: Payer: Self-pay | Admitting: Psychiatry

## 2018-10-12 ENCOUNTER — Ambulatory Visit (INDEPENDENT_AMBULATORY_CARE_PROVIDER_SITE_OTHER): Payer: Medicare Other | Admitting: Psychology

## 2018-10-12 ENCOUNTER — Ambulatory Visit (INDEPENDENT_AMBULATORY_CARE_PROVIDER_SITE_OTHER): Payer: Medicare Other | Admitting: Psychiatry

## 2018-10-12 VITALS — Wt 116.0 lb

## 2018-10-12 DIAGNOSIS — F3163 Bipolar disorder, current episode mixed, severe, without psychotic features: Secondary | ICD-10-CM | POA: Diagnosis not present

## 2018-10-12 DIAGNOSIS — F5101 Primary insomnia: Secondary | ICD-10-CM | POA: Diagnosis not present

## 2018-10-12 DIAGNOSIS — F339 Major depressive disorder, recurrent, unspecified: Secondary | ICD-10-CM

## 2018-10-12 DIAGNOSIS — F419 Anxiety disorder, unspecified: Secondary | ICD-10-CM

## 2018-10-12 MED ORDER — BUSPIRONE HCL 15 MG PO TABS: ORAL_TABLET | ORAL | 2 refills | Status: DC

## 2018-10-12 MED ORDER — OLANZAPINE 5 MG PO TABS: 5.0000 mg | ORAL_TABLET | Freq: Every day | ORAL | 2 refills | Status: DC

## 2018-10-12 NOTE — Progress Notes (Signed)
Karen Hayden [id number] 11-28-1975 43 y.o.  Subjective:   Patient ID:  Karen Hayden is a 43 y.o. (DOB 01/28/1976) female.  Chief Complaint:  Chief Complaint  Patient presents with  . Anxiety  . Other    irritability    HPI Karen Hayden presents to the office today for follow-up of mood instability, anxiety, and insomnia. "I'm much better... my happiness. I'm sleeping much better, I'm eating much better." She reports that she took Zyprexa 2.5 mg po QHS x 2 nights, then increased to 5 mg po QHS and is now sleeping through the night. "I like life a little bit more now." Reports that she is now able to have some positive memories of her deceased daughter. She is no longer crying frequently. Reports decreased mood lability.  She reports that she continues to feel anxious and is picking her nails. She reports that she continues to get startled easily, such as when neighbors above her are making noises and when she hears sirens. Reports that she continues to have intrusive memories and some re-experiencing. Reports that there was an event at work where they were suctioning an animal and this triggered flashback of her deceased daughter being suctioned and that she was unable to finish working and had to stay and calm down before she was able to drive home. She reports that she continues to have panic attacks, often after a trigger related to daughter's death. She reports that she has had racing heart, SOB, and feeling as if she is about to pass out. Continued worry. She reports that she continues have some irritability and increased frustration even with objects, such as slamming the remote control when it wouldn't change channels. Reports that irritability may be worse. Reports that she is often impatient. She reports that her sadness has decreased and that she notices feeling somewhat "numb...where it si comfortable." She reports that she is getting out more, doing more things, and socializing more.  Energy and motivation have improved. She reports that her concentration is "much better" and she has been able to perform better at work and is not getting as frustrated at work. Reports occasional racing thoughts and that this has decreased. She reports that has had less impulsivity and hypersexuality. Denies SI.   Continues to see Dr. Cheryln Manly and is  seeing him every 2 weeks.   Past Psychiatric Medication Trials: Lexapro- sexual side effects. Helped with depressive episode. Prozac- "I liked Prozac." Effexor- took around 2001 Gabapentin- was prescribed for neuropathy and was effective. Was also prescribed TID by Dr. Caprice Beaver.  Lamictal- Prescribed by Dr. Caprice Beaver. Was helpful for racing thoughts. Depakote- Was prescribed 1500 mg po QHS. Drowsiness. Olanzapine- "calmed me down." Denies excessive somnolence. Geodon- Excessive drowsiness. Risperdal- Prescribed during hospitalization. Some somnolence. Seroquel- Had a seizure.  Buspar- Effective and well tolerated.  Klonopin- Caused her to have nightmares. Lunesta- More effective than Ambien Ambien Trazodone- Sleep paralysis   Review of Systems:  Review of Systems  Gastrointestinal:       Improved gastroparesis since starting Zyprexa  Endocrine:       Reports improved glycemic control. She reports that her glucose levels were often above 500 before Zyprexa. Reports that now glucose levels are around 130. She reports that she is now eating regularly, checking her glucose levels more frequently, and exercising.   Musculoskeletal: Negative for gait problem.  Neurological: Negative for tremors.  Psychiatric/Behavioral:       Please refer to HPI    Medications:  I have reviewed the patient's current medications.  Current Outpatient Medications  Medication Sig Dispense Refill  . ibuprofen (ADVIL,MOTRIN) 200 MG tablet Take 800 mg by mouth as needed for moderate pain.    Marland Kitchen insulin lispro (HUMALOG) 100 UNIT/ML cartridge Inject into the  skin 4 (four) times daily. 1 unit per 10gm of Carbs. Counts carbs with her meals to get dosage    . insulin NPH Human (HUMULIN N,NOVOLIN N) 100 UNIT/ML injection Inject 11 Units into the skin 2 (two) times daily.    . methimazole (TAPAZOLE) 5 MG tablet Take 5 mg by mouth daily.    . metoCLOPramide (REGLAN) 5 MG tablet Take 5 mg by mouth 4 (four) times daily.    Marland Kitchen OLANZapine (ZYPREXA) 5 MG tablet Take 1 tablet (5 mg total) by mouth at bedtime for 30 days. 30 tablet 2  . promethazine (PHENERGAN) 25 MG tablet Take 1 tablet (25 mg total) by mouth every 6 (six) hours as needed for nausea or vomiting. 12 tablet 0  . azithromycin (ZITHROMAX Z-PAK) 250 MG tablet Take as directed. (Patient not taking: Reported on 09/19/2018) 5 each 0  . busPIRone (BUSPAR) 15 MG tablet Take 1/3 tablet p.o. twice daily for 1 week, then take 2/3 tablet p.o. twice daily for 1 week, then take 1 tablet p.o. twice daily 60 tablet 2  . INSULIN SYRINGE .5CC/28G (INS SYRINGE/NEEDLE .5CC/28G) 28G X 1/2" 0.5 ML MISC Use as instructed to administer insulin 60 each 1  . metoprolol tartrate (LOPRESSOR) 25 MG tablet Take 0.5 tablets (12.5 mg total) by mouth 2 (two) times daily. (Patient not taking: Reported on 09/19/2018) 180 tablet 1  . nitroGLYCERIN (NITROSTAT) 0.4 MG SL tablet Place 1 tablet (0.4 mg total) under the tongue every 5 (five) minutes as needed for chest pain. 30 tablet 3   No current facility-administered medications for this visit.     Medication Side Effects: Other: Mild sexual side effects and mild affective dulling and reports both are tolerable.   Allergies:  Allergies  Allergen Reactions  . Seroquel [Quetiapine Fumarate]     Seizure  . Lipitor [Atorvastatin]     Attacks joints, inflamation  . Codeine Rash  . Isosorbide Palpitations    Rapid HR after 1st dose - ?hypotension with rebound tachy / she tolerates prn SL NTG just fine    Past Medical History:  Diagnosis Date  . Bipolar 1 disorder ([place])   . Chronic  kidney infection   . Diabetes mellitus type I ([place])   . Diabetic gastroparesis ([place])   . DM (diabetes mellitus) ([place])   . Gastroparesis    Due to diabetes   . Hyperlipidemia   . Hypertension   . Hypothyroidism    Has thyroiditis with subsequent hyperthyroidism so off Synthroid  . Seizure disorder ([place])   . Umbilical hernia     Family History  Problem Relation Age of Onset  . Diabetes Mother   . Heart attack Mother   . Depression Mother   . Anxiety disorder Mother   . Ovarian cancer Paternal Grandmother        great grandmother  . Diabetes Paternal Grandmother   . Mood Disorder Father   . Depression Maternal Grandmother   . Anxiety disorder Maternal Grandmother   . Goiter Maternal Grandmother   . Diabetes Maternal Grandmother   . Depression Paternal Aunt   . Anxiety disorder Paternal Aunt   . Diabetes Paternal Aunt   . Diabetes Paternal Aunt   . Colon  cancer Neg Hx     Social History   Socioeconomic History  . Marital status: Single    Spouse name: Not on file  . Number of children: 2  . Years of education: Not on file  . Highest education level: Not on file  Occupational History  . 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 Financial resource strain: Not on file  . Food insecurity:    Worry: Not on file    Inability: Not on file  . Transportation needs:    Medical: Not on file    Non-medical: Not on file  Tobacco Use  . Smoking status: Former Smoker    Packs/day: 1.00    Years: 13.00    Pack years: 13.00    Types: Cigarettes    Last attempt to quit: 02/17/2018    Years since quitting: 0.6  . Smokeless tobacco: Never Used  . Tobacco comment: Tobacco info given  Substance and Sexual Activity  . Alcohol use: Not Currently  . 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 Sexual activity: Yes    Birth control/protection: Surgical  Lifestyle  . Physical activity:    Days per week: Not on  file    Minutes per session: Not on file  . Stress: Not on file  Relationships  . Social connections:    Talks on phone: Not on file    Gets together: Not on file    Attends religious service: Not on file    Active member of club or organization: Not on file    Attends meetings of clubs or organizations: Not on file    Relationship status: Not on file  . Intimate partner violence:    Fear of current or ex partner: Not on file    Emotionally abused: Not on file    Physically abused: Not on file    Forced sexual activity: Not on file  Other Topics Concern  . Not on file  Social History Narrative  . Not on file    Past Medical History, Surgical history, Social history, and Family history were reviewed and updated as appropriate.   Please see review of systems for further details on the patient's review from today.   Objective:   Physical Exam:  Wt 116 lb (52.6 kg)   BMI 21.92 kg/m   Physical Exam Constitutional:      General: She is not in acute distress.    Appearance: She is well-developed.  Musculoskeletal:        General: No deformity.  Neurological:     Mental Status: She is alert and oriented to person, place, and time.     Coordination: Coordination normal.  Psychiatric:        Attention and Perception: Attention and perception normal. She does not perceive auditory or visual hallucinations.        Mood and Affect: Mood is anxious. Mood is not depressed. Affect is not labile, blunt, angry or inappropriate.        Speech: Speech normal.        Behavior: Behavior normal.        Thought Content: Thought content normal. Thought content does not include homicidal or suicidal ideation. Thought content does not include homicidal or suicidal plan.        Cognition and Memory: Cognition and memory normal.        Judgment:  Judgment normal.     Comments: Insight intact. No delusions.  AIMS=0     Lab Review:     Component Value Date/Time   NA 140 08/12/2018 0930    K 4.8 08/12/2018 0930   CL 101 08/12/2018 0930   CO2 24 08/12/2018 0930   GLUCOSE 108 (H) 08/12/2018 0930   GLUCOSE 367 (H) 07/07/2018 0024   BUN 9 08/12/2018 0930   CREATININE 0.71 08/12/2018 0930   CALCIUM 10.2 08/12/2018 0930   PROT 6.6 08/12/2018 0930   ALBUMIN 4.4 08/12/2018 0930   AST 14 08/12/2018 0930   ALT 12 08/12/2018 0930   ALKPHOS 100 08/12/2018 0930   BILITOT 0.3 08/12/2018 0930   GFRNONAA 105 08/12/2018 0930   GFRAA 121 08/12/2018 0930       Component Value Date/Time   WBC 6.8 08/12/2018 0930   WBC 7.8 07/07/2018 0024   RBC 5.00 08/12/2018 0930   RBC 5.05 07/07/2018 0024   HGB 13.7 08/12/2018 0930   HCT 41.5 08/12/2018 0930   PLT 351 08/12/2018 0930   MCV 83 08/12/2018 0930   MCH 27.4 08/12/2018 0930   MCH 27.5 07/07/2018 0024   MCHC 33.0 08/12/2018 0930   MCHC 32.1 07/07/2018 0024   RDW 13.3 08/12/2018 0930   LYMPHSABS 3.4 05/05/2017 1345   MONOABS 0.7 05/05/2017 1345   EOSABS 0.0 05/05/2017 1345   BASOSABS 0.0 05/05/2017 1345    No results found for: POCLITH, LITHIUM   No results found for: PHENYTOIN, PHENOBARB, VALPROATE, CBMZ   .res Assessment: Plan:   Discussed potential benefits, risks, and side effects of treatment options to include increasing Olanzapine to 7.5 mg po QHS to improve anxiety and mild irritability since her sleep and appetite significantly improved with Olanzapine 5 mg po QHS and mood s/s have also improved, or start Buspar for anxiety. Pt reports that she would prefer to continue Olanzapine 5 mg po QHS and start Buspar since it worked well for her anxiety in the past and was well tolerated and since anxiety is currently her primary complaint.  Start BuSpar 15 mg 1/3 tablet twice daily for 1 week, then increase to 2/3 tablet twice daily for 1 week, then increase to 1 tablet twice daily for anxiety. Continue Olanzapine 5 mg po QHS for mood, anxiety, insomnia, and appetite.  Recommend continuing therapy. Bipolar disorder, current  episode mixed, severe, without psychotic features ([place]) - Plan: OLANZapine (ZYPREXA) 5 MG tablet  Anxiety disorder, unspecified type - Plan: OLANZapine (ZYPREXA) 5 MG tablet, busPIRone (BUSPAR) 15 MG tablet  Primary insomnia - Plan: OLANZapine (ZYPREXA) 5 MG tablet  Please see After Visit Summary for patient specific instructions.  Future Appointments  Date Time Provider [place]  10/26/2018  8:30 AM Oren Binet, PhD [facility] None  11/23/2018  8:45 AM Thayer Headings, PMHNP [facility] None  12/22/2018  1:20 PM Dorothy Spark, MD [facility]    No orders of the defined types were placed in this encounter.     -------------------------------

## 2018-10-26 ENCOUNTER — Ambulatory Visit (INDEPENDENT_AMBULATORY_CARE_PROVIDER_SITE_OTHER): Payer: Medicare Other | Admitting: Psychology

## 2018-10-26 DIAGNOSIS — F339 Major depressive disorder, recurrent, unspecified: Secondary | ICD-10-CM | POA: Diagnosis not present

## 2018-11-08 ENCOUNTER — Ambulatory Visit: Payer: Self-pay | Admitting: Psychology

## 2018-11-15 DIAGNOSIS — E109 Type 1 diabetes mellitus without complications: Secondary | ICD-10-CM | POA: Diagnosis not present

## 2018-11-15 DIAGNOSIS — E039 Hypothyroidism, unspecified: Secondary | ICD-10-CM | POA: Diagnosis not present

## 2018-11-15 DIAGNOSIS — E7800 Pure hypercholesterolemia, unspecified: Secondary | ICD-10-CM | POA: Diagnosis not present

## 2018-11-15 DIAGNOSIS — R300 Dysuria: Secondary | ICD-10-CM | POA: Diagnosis not present

## 2018-11-15 DIAGNOSIS — G609 Hereditary and idiopathic neuropathy, unspecified: Secondary | ICD-10-CM | POA: Diagnosis not present

## 2018-11-15 DIAGNOSIS — N390 Urinary tract infection, site not specified: Secondary | ICD-10-CM | POA: Diagnosis not present

## 2018-11-18 ENCOUNTER — Other Ambulatory Visit (HOSPITAL_COMMUNITY): Payer: Self-pay | Admitting: Endocrinology

## 2018-11-18 ENCOUNTER — Other Ambulatory Visit: Payer: Self-pay | Admitting: Endocrinology

## 2018-11-18 DIAGNOSIS — E0590 Thyrotoxicosis, unspecified without thyrotoxic crisis or storm: Secondary | ICD-10-CM

## 2018-11-22 ENCOUNTER — Ambulatory Visit (INDEPENDENT_AMBULATORY_CARE_PROVIDER_SITE_OTHER): Payer: Medicare Other | Admitting: Psychology

## 2018-11-22 DIAGNOSIS — F339 Major depressive disorder, recurrent, unspecified: Secondary | ICD-10-CM

## 2018-11-23 ENCOUNTER — Other Ambulatory Visit: Payer: Self-pay

## 2018-11-23 ENCOUNTER — Ambulatory Visit (INDEPENDENT_AMBULATORY_CARE_PROVIDER_SITE_OTHER): Payer: Medicare Other | Admitting: Psychiatry

## 2018-11-23 ENCOUNTER — Encounter: Payer: Self-pay | Admitting: Psychiatry

## 2018-11-23 DIAGNOSIS — F3163 Bipolar disorder, current episode mixed, severe, without psychotic features: Secondary | ICD-10-CM | POA: Diagnosis not present

## 2018-11-23 DIAGNOSIS — F5101 Primary insomnia: Secondary | ICD-10-CM | POA: Diagnosis not present

## 2018-11-23 DIAGNOSIS — F419 Anxiety disorder, unspecified: Secondary | ICD-10-CM

## 2018-11-23 MED ORDER — OLANZAPINE 5 MG PO TABS: 5.0000 mg | ORAL_TABLET | Freq: Every day | ORAL | 2 refills | Status: DC

## 2018-11-23 MED ORDER — BUSPIRONE HCL 30 MG PO TABS: 30.0000 mg | ORAL_TABLET | Freq: Two times a day (BID) | ORAL | 1 refills | Status: DC

## 2018-11-23 NOTE — Progress Notes (Signed)
Karen Hayden [id number] 31-Mar-1976 43 y.o.  Subjective:   Patient ID:  Karen Hayden is a 43 y.o. (DOB 08-21-1976) female.  Chief Complaint: No chief complaint on file.  Virtual Visit via Telephone Note  I connected with Karen Hayden on 11/23/18 at  8:45 AM EDT by telephone and verified that I am speaking with the correct person using two identifiers.   I discussed the limitations, risks, security and privacy concerns of performing an evaluation and management service by telephone and the availability of in person appointments. I also discussed with the patient that there may be a patient responsible charge related to this service. The patient expressed understanding and agreed to proceed.  I discussed the assessment and treatment plan with the patient. The patient was provided an opportunity to ask questions and all were answered. The patient agreed with the plan and demonstrated an understanding of the instructions.   The patient was advised to call back or seek an in-person evaluation if the symptoms worsen or if the condition fails to improve as anticipated.  I provided 25 minutes of non-face-to-face time during this encounter. The call started at 8:50 and ended at 9:15 am. The provider was located at [place] and pt was at home.    Thayer Headings, PMHNP   HPI Karen Hayden presents to the office today for follow-up of   "I'm doing well."  She reports that she is having increased irritability. Reports that she is going to have radioactive iodine treatment 12/21/18 and she is not sure if this r/t thyroid function. Reports that there has been a sudden worsening in irritability. She reports that she is throwing things and slamming doors, and that her boss has addressed this with her. Denies impulsive or risky behaviors. Denies impulsive spending. Denies increased talkativeness. Reports that thoughts are "racy" at times and jumping from one thing to the next. Denies elevated or  depressed mood. She reports that she has periodic episodes of crying r/t grief. Denies SI.   She reports that she is sleeping well. Appetite is "perfect." Reports that current 113.8 lbs and she has had intentional weight gain. Energy and motivation have been normal. Denies excessive energy. Denies impaired concentration.   She reports "I"m living in the past" and is thinking frequently focused on events around daughter's death. She reports that she has been having worsening intrusive memories and re-experiencing. She reports that her anxiety is "much better" and that she has stopped picking at her nails and her face. Reports worrying about the past and not the present or future. Denies recent panic attacks. Reports that chest pains she was having in the past have stopped with Buspar.   She reports 2 weeks ago that her deceased daughter's fiance was shot in the head and he is now staying at her house. She reports that this was a triggering event for her. She reports that her irritability started prior to this event.  Reports that her work  Past Psychiatric Medication Trials: Lexapro- sexual side effects. Helped with depressive episode. Prozac- "I liked Prozac." Effexor- took around 2001 Gabapentin- was prescribed for neuropathy and was effective. Was also prescribed TID by Dr. Caprice Beaver.  Lamictal- Prescribed by Dr. Caprice Beaver. Was helpful for racing thoughts. Depakote- Was prescribed 1500 mg po QHS. Drowsiness. Olanzapine- "calmed me down." Denies excessive somnolence. Geodon- Excessive drowsiness. Risperdal- Prescribed during hospitalization. Some somnolence. Seroquel- Had a seizure.  Buspar- Effective and well tolerated.  Klonopin- Caused her to have nightmares.  Lunesta- More effective than Ambien Ambien Trazodone- Sleep paralysis  Review of Systems:  Review of Systems  Endocrine:       She reports that she has had fluctuations in glucose. Reports that thyroid levels have been elevated  and endocrinologist suspects Grave's disease.  Genitourinary:       Recent UTI. Currently being tx'd and reports that s/s are improving   Reports that GI s/s have improved with olanzapine and not having to take Promethazine or reglan.  Medications: I have reviewed the patient's current medications.  Current Outpatient Medications  Medication Sig Dispense Refill  . amoxicillin-clavulanate (AUGMENTIN) 500-125 MG tablet     . busPIRone (BUSPAR) 15 MG tablet Take 1/3 tablet p.o. twice daily for 1 week, then take 2/3 tablet p.o. twice daily for 1 week, then take 1 tablet p.o. twice daily 60 tablet 2  . ibuprofen (ADVIL,MOTRIN) 200 MG tablet Take 800 mg by mouth as needed for moderate pain.    Marland Kitchen insulin lispro (HUMALOG) 100 UNIT/ML cartridge Inject into the skin 4 (four) times daily. 1 unit per 8gm of Carbs. Counts carbs with her meals to get dosage    . insulin NPH Human (HUMULIN N,NOVOLIN N) 100 UNIT/ML injection Inject 18 Units into the skin 2 (two) times daily.     . promethazine (PHENERGAN) 25 MG tablet Take 1 tablet (25 mg total) by mouth every 6 (six) hours as needed for nausea or vomiting. 12 tablet 0  . busPIRone (BUSPAR) 30 MG tablet Take 1 tablet (30 mg total) by mouth 2 (two) times daily for 30 days. 60 tablet 1  . INSULIN SYRINGE .5CC/28G (INS SYRINGE/NEEDLE .5CC/28G) 28G X 1/2" 0.5 ML MISC Use as instructed to administer insulin 60 each 1  . methimazole (TAPAZOLE) 5 MG tablet Take 5 mg by mouth daily.    . metoCLOPramide (REGLAN) 5 MG tablet Take 5 mg by mouth 4 (four) times daily.    . metoprolol tartrate (LOPRESSOR) 25 MG tablet Take 0.5 tablets (12.5 mg total) by mouth 2 (two) times daily. (Patient not taking: Reported on 09/19/2018) 180 tablet 1  . nitroGLYCERIN (NITROSTAT) 0.4 MG SL tablet Place 1 tablet (0.4 mg total) under the tongue every 5 (five) minutes as needed for chest pain. 30 tablet 3  . OLANZapine (ZYPREXA) 5 MG tablet Take 1 tablet (5 mg total) by mouth at bedtime for  30 days. 30 tablet 2   No current facility-administered medications for this visit.     Medication Side Effects: None  Allergies:  Allergies  Allergen Reactions  . Seroquel [Quetiapine Fumarate]     Seizure  . Lipitor [Atorvastatin]     Attacks joints, inflamation  . Codeine Rash  . Isosorbide Palpitations    Rapid HR after 1st dose - ?hypotension with rebound tachy / she tolerates prn SL NTG just fine    Past Medical History:  Diagnosis Date  . Bipolar 1 disorder ([place])   . Chronic kidney infection   . Diabetes mellitus type I ([place])   . Diabetic gastroparesis ([place])   . DM (diabetes mellitus) ([place])   . Gastroparesis    Due to diabetes   . Hyperlipidemia   . Hypertension   . Hypothyroidism    Has thyroiditis with subsequent hyperthyroidism so off Synthroid  . Seizure disorder ([place])   . Umbilical hernia     Family History  Problem Relation Age of Onset  . Diabetes Mother   . Heart attack Mother   . Depression Mother   .  Anxiety disorder Mother   . Ovarian cancer Paternal Grandmother        great grandmother  . Diabetes Paternal Grandmother   . Mood Disorder Father   . Depression Maternal Grandmother   . Anxiety disorder Maternal Grandmother   . Goiter Maternal Grandmother   . Diabetes Maternal Grandmother   . Depression Paternal Aunt   . Anxiety disorder Paternal Aunt   . Diabetes Paternal Aunt   . Diabetes Paternal Aunt   . Colon cancer Neg Hx     Social History   Socioeconomic History  . Marital status: Single    Spouse name: Not on file  . Number of children: 2  . Years of education: Not on file  . Highest education level: Not on file  Occupational History  . 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 Financial resource strain: Not on file  . Food insecurity:    Worry: Not on file    Inability: Not on file  . Transportation needs:    Medical: Not on file    Non-medical: Not on file  Tobacco Use  . Smoking status:  Former Smoker    Packs/day: 1.00    Years: 13.00    Pack years: 13.00    Types: Cigarettes    Last attempt to quit: 02/17/2018    Years since quitting: 0.7  . Smokeless tobacco: Never Used  . Tobacco comment: Tobacco info given  Substance and Sexual Activity  . Alcohol use: Not Currently  . 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 Sexual activity: Yes    Birth control/protection: Surgical  Lifestyle  . Physical activity:    Days per week: Not on file    Minutes per session: Not on file  . Stress: Not on file  Relationships  . Social connections:    Talks on phone: Not on file    Gets together: Not on file    Attends religious service: Not on file    Active member of club or organization: Not on file    Attends meetings of clubs or organizations: Not on file    Relationship status: Not on file  . Intimate partner violence:    Fear of current or ex partner: Not on file    Emotionally abused: Not on file    Physically abused: Not on file    Forced sexual activity: Not on file  Other Topics Concern  . Not on file  Social History Narrative  . Not on file    Past Medical History, Surgical history, Social history, and Family history were reviewed and updated as appropriate.   Please see review of systems for further details on the patient's review from today.   Objective:   Physical Exam:  There were no vitals taken for this visit.  Physical Exam Neurological:     Mental Status: She is alert and oriented to person, place, and time.  Psychiatric:        Attention and Perception: Attention normal.        Mood and Affect: Mood is anxious.        Speech: Speech normal.        Behavior: Behavior is cooperative.        Thought Content: Thought content normal.        Cognition and Memory: Cognition normal.        Judgment:  Judgment normal.     Lab Review:     Component Value Date/Time   NA 140 08/12/2018 0930   K 4.8  08/12/2018 0930   CL 101 08/12/2018 0930   CO2 24 08/12/2018 0930   GLUCOSE 108 (H) 08/12/2018 0930   GLUCOSE 367 (H) 07/07/2018 0024   BUN 9 08/12/2018 0930   CREATININE 0.71 08/12/2018 0930   CALCIUM 10.2 08/12/2018 0930   PROT 6.6 08/12/2018 0930   ALBUMIN 4.4 08/12/2018 0930   AST 14 08/12/2018 0930   ALT 12 08/12/2018 0930   ALKPHOS 100 08/12/2018 0930   BILITOT 0.3 08/12/2018 0930   GFRNONAA 105 08/12/2018 0930   GFRAA 121 08/12/2018 0930       Component Value Date/Time   WBC 6.8 08/12/2018 0930   WBC 7.8 07/07/2018 0024   RBC 5.00 08/12/2018 0930   RBC 5.05 07/07/2018 0024   HGB 13.7 08/12/2018 0930   HCT 41.5 08/12/2018 0930   PLT 351 08/12/2018 0930   MCV 83 08/12/2018 0930   MCH 27.4 08/12/2018 0930   MCH 27.5 07/07/2018 0024   MCHC 33.0 08/12/2018 0930   MCHC 32.1 07/07/2018 0024   RDW 13.3 08/12/2018 0930   LYMPHSABS 3.4 05/05/2017 1345   MONOABS 0.7 05/05/2017 1345   EOSABS 0.0 05/05/2017 1345   BASOSABS 0.0 05/05/2017 1345    No results found for: POCLITH, LITHIUM   No results found for: PHENYTOIN, PHENOBARB, VALPROATE, CBMZ   .res Assessment: Plan:   Discussed possible treatment options with patient and potential benefits risks and side effects of these options, to include increasing olanzapine to 7.5 mg at bedtime, which could potentially raise glucose levels, or increasing BuSpar to further improve anxiety and irritability. Pt reports that she would prefer to increase Buspar. Will increase Buspar to 20 mg po BID for anxiety for one week, then increase to 30 mg po BID. Advised pt to contact office if she has any tolerability issues or irritability worsens or becomes intolerable.  Continue Olanzapine 5 mg po QHS for mood, anxiety, and insomnia.  Anxiety disorder, unspecified type - Plan: busPIRone (BUSPAR) 30 MG tablet, OLANZapine (ZYPREXA) 5 MG tablet  Bipolar disorder, current episode mixed, severe, without psychotic features ([place]) - Plan: OLANZapine  (ZYPREXA) 5 MG tablet  Primary insomnia - Plan: OLANZapine (ZYPREXA) 5 MG tablet  Please see After Visit Summary for patient specific instructions.  Future Appointments  Date Time Provider [place]  12/06/2018  3:00 PM Oren Binet, PhD [facility] None  12/20/2018 11:30 AM Oren Binet, PhD [facility] None  12/21/2018  8:00 AM [facility] 1 [facility]  12/21/2018 12:00 PM [facility] 2 [facility]  12/22/2018  8:00 AM [facility] 1 [facility]  12/22/2018  1:20 PM Dorothy Spark, MD [facility]    No orders of the defined types were placed in this encounter.     -------------------------------

## 2018-12-06 ENCOUNTER — Ambulatory Visit (INDEPENDENT_AMBULATORY_CARE_PROVIDER_SITE_OTHER): Payer: Medicare Other | Admitting: Psychology

## 2018-12-06 DIAGNOSIS — F339 Major depressive disorder, recurrent, unspecified: Secondary | ICD-10-CM

## 2018-12-07 ENCOUNTER — Other Ambulatory Visit: Payer: Self-pay | Admitting: Psychiatry

## 2018-12-07 DIAGNOSIS — F419 Anxiety disorder, unspecified: Secondary | ICD-10-CM

## 2018-12-09 ENCOUNTER — Telehealth: Payer: Self-pay | Admitting: *Deleted

## 2018-12-09 NOTE — Telephone Encounter (Signed)
Called patient to discuss video vs telephone visit for 12/22/2018 but did not get an answer. Will wait for patient to return call and/or attempt to reach patient at another time.

## 2018-12-13 NOTE — Telephone Encounter (Signed)
Left the pt a message to call the office back to discuss her upcoming virtual OV appt with Dr Meda Coffee on 4/23, and to discuss whether this will be a telephone vs video visit, and arrange for consent.

## 2018-12-17 ENCOUNTER — Other Ambulatory Visit: Payer: Self-pay | Admitting: Psychiatry

## 2018-12-17 DIAGNOSIS — F419 Anxiety disorder, unspecified: Secondary | ICD-10-CM

## 2018-12-20 ENCOUNTER — Ambulatory Visit (INDEPENDENT_AMBULATORY_CARE_PROVIDER_SITE_OTHER): Payer: Medicare Other | Admitting: Psychology

## 2018-12-20 DIAGNOSIS — F339 Major depressive disorder, recurrent, unspecified: Secondary | ICD-10-CM | POA: Diagnosis not present

## 2018-12-21 ENCOUNTER — Other Ambulatory Visit: Payer: Self-pay

## 2018-12-21 ENCOUNTER — Encounter (HOSPITAL_COMMUNITY)
Admission: RE | Admit: 2018-12-21 | Discharge: 2018-12-21 | Disposition: A | Discharge status: Home / Self Care | Payer: Medicare Other | Source: Ambulatory Visit | Attending: Endocrinology | Admitting: Endocrinology

## 2018-12-21 DIAGNOSIS — E0590 Thyrotoxicosis, unspecified without thyrotoxic crisis or storm: Secondary | ICD-10-CM | POA: Diagnosis not present

## 2018-12-22 ENCOUNTER — Telehealth: Payer: Medicare Other | Admitting: Cardiology

## 2018-12-22 ENCOUNTER — Encounter (HOSPITAL_COMMUNITY)
Admission: RE | Admit: 2018-12-22 | Discharge: 2018-12-22 | Disposition: A | Discharge status: Home / Self Care | Payer: Medicare Other | Source: Ambulatory Visit | Attending: Endocrinology | Admitting: Endocrinology

## 2018-12-22 DIAGNOSIS — E0500 Thyrotoxicosis with diffuse goiter without thyrotoxic crisis or storm: Secondary | ICD-10-CM | POA: Diagnosis not present

## 2018-12-22 DIAGNOSIS — E0590 Thyrotoxicosis, unspecified without thyrotoxic crisis or storm: Secondary | ICD-10-CM | POA: Diagnosis not present

## 2018-12-22 MED ORDER — SODIUM IODIDE I-123 7.4 MBQ CAPS
484.0000 | ORAL_CAPSULE | Freq: Once | ORAL | Status: AC
  Administered 2018-12-21: 09:00:00 484 via ORAL

## 2018-12-22 NOTE — Progress Notes (Signed)
The patient didn't answer the phone after multiple attempts

## 2018-12-22 NOTE — Telephone Encounter (Signed)
Left patient another voicemail to call the office back in regards to her appointment with Dr Meda Coffee.

## 2019-01-04 ENCOUNTER — Other Ambulatory Visit (HOSPITAL_COMMUNITY): Payer: Self-pay | Admitting: Endocrinology

## 2019-01-04 DIAGNOSIS — E0590 Thyrotoxicosis, unspecified without thyrotoxic crisis or storm: Secondary | ICD-10-CM

## 2019-01-06 ENCOUNTER — Encounter (HOSPITAL_COMMUNITY)
Admission: RE | Admit: 2019-01-06 | Discharge: 2019-01-06 | Disposition: A | Discharge status: Home / Self Care | Payer: Medicare Other | Source: Ambulatory Visit | Attending: Endocrinology | Admitting: Endocrinology

## 2019-01-06 ENCOUNTER — Other Ambulatory Visit: Payer: Self-pay

## 2019-01-06 DIAGNOSIS — E0590 Thyrotoxicosis, unspecified without thyrotoxic crisis or storm: Secondary | ICD-10-CM | POA: Insufficient documentation

## 2019-01-06 DIAGNOSIS — E0500 Thyrotoxicosis with diffuse goiter without thyrotoxic crisis or storm: Secondary | ICD-10-CM | POA: Diagnosis not present

## 2019-01-06 MED ORDER — SODIUM IODIDE I 131 CAPSULE
20.2000 | Freq: Once | INTRAVENOUS | Status: AC | PRN
  Administered 2019-01-06: 14:00:00 20.2 via ORAL

## 2019-01-09 DIAGNOSIS — E0590 Thyrotoxicosis, unspecified without thyrotoxic crisis or storm: Secondary | ICD-10-CM | POA: Diagnosis not present

## 2019-01-09 DIAGNOSIS — E039 Hypothyroidism, unspecified: Secondary | ICD-10-CM | POA: Diagnosis not present

## 2019-01-09 DIAGNOSIS — E109 Type 1 diabetes mellitus without complications: Secondary | ICD-10-CM | POA: Diagnosis not present

## 2019-02-09 DIAGNOSIS — N390 Urinary tract infection, site not specified: Secondary | ICD-10-CM | POA: Diagnosis not present

## 2019-02-12 ENCOUNTER — Other Ambulatory Visit: Payer: Self-pay | Admitting: Psychiatry

## 2019-02-12 DIAGNOSIS — F419 Anxiety disorder, unspecified: Secondary | ICD-10-CM

## 2019-02-24 DIAGNOSIS — N390 Urinary tract infection, site not specified: Secondary | ICD-10-CM | POA: Diagnosis not present

## 2019-03-02 ENCOUNTER — Other Ambulatory Visit: Payer: Self-pay | Admitting: Psychiatry

## 2019-03-02 DIAGNOSIS — F419 Anxiety disorder, unspecified: Secondary | ICD-10-CM

## 2019-03-02 NOTE — Telephone Encounter (Signed)
Needs appt

## 2019-03-07 DIAGNOSIS — E039 Hypothyroidism, unspecified: Secondary | ICD-10-CM | POA: Diagnosis not present

## 2019-03-14 DIAGNOSIS — L709 Acne, unspecified: Secondary | ICD-10-CM | POA: Diagnosis not present

## 2019-03-14 DIAGNOSIS — F329 Major depressive disorder, single episode, unspecified: Secondary | ICD-10-CM | POA: Diagnosis not present

## 2019-03-14 DIAGNOSIS — E7800 Pure hypercholesterolemia, unspecified: Secondary | ICD-10-CM | POA: Diagnosis not present

## 2019-03-14 DIAGNOSIS — E039 Hypothyroidism, unspecified: Secondary | ICD-10-CM | POA: Diagnosis not present

## 2019-03-14 DIAGNOSIS — E109 Type 1 diabetes mellitus without complications: Secondary | ICD-10-CM | POA: Diagnosis not present

## 2019-03-14 DIAGNOSIS — G609 Hereditary and idiopathic neuropathy, unspecified: Secondary | ICD-10-CM | POA: Diagnosis not present

## 2019-03-14 DIAGNOSIS — K3184 Gastroparesis: Secondary | ICD-10-CM | POA: Diagnosis not present

## 2019-04-25 DIAGNOSIS — R300 Dysuria: Secondary | ICD-10-CM | POA: Diagnosis not present

## 2019-04-25 DIAGNOSIS — N390 Urinary tract infection, site not specified: Secondary | ICD-10-CM | POA: Diagnosis not present

## 2019-04-25 DIAGNOSIS — E039 Hypothyroidism, unspecified: Secondary | ICD-10-CM | POA: Diagnosis not present

## 2019-05-03 ENCOUNTER — Emergency Department (HOSPITAL_COMMUNITY): Payer: Medicare Other

## 2019-05-03 ENCOUNTER — Observation Stay (HOSPITAL_COMMUNITY)
Admission: EM | Admit: 2019-05-03 | Discharge: 2019-05-04 | Disposition: A | Discharge status: Home / Self Care | Payer: Medicare Other | Attending: Family Medicine | Admitting: Family Medicine

## 2019-05-03 ENCOUNTER — Other Ambulatory Visit: Payer: Self-pay

## 2019-05-03 ENCOUNTER — Encounter (HOSPITAL_COMMUNITY): Payer: Self-pay | Admitting: Internal Medicine

## 2019-05-03 DIAGNOSIS — Z8249 Family history of ischemic heart disease and other diseases of the circulatory system: Secondary | ICD-10-CM | POA: Insufficient documentation

## 2019-05-03 DIAGNOSIS — E785 Hyperlipidemia, unspecified: Secondary | ICD-10-CM | POA: Insufficient documentation

## 2019-05-03 DIAGNOSIS — R112 Nausea with vomiting, unspecified: Principal | ICD-10-CM | POA: Insufficient documentation

## 2019-05-03 DIAGNOSIS — E1065 Type 1 diabetes mellitus with hyperglycemia: Secondary | ICD-10-CM | POA: Diagnosis not present

## 2019-05-03 DIAGNOSIS — F1220 Cannabis dependence, uncomplicated: Secondary | ICD-10-CM | POA: Diagnosis not present

## 2019-05-03 DIAGNOSIS — R531 Weakness: Secondary | ICD-10-CM | POA: Diagnosis not present

## 2019-05-03 DIAGNOSIS — Z79899 Other long term (current) drug therapy: Secondary | ICD-10-CM | POA: Diagnosis not present

## 2019-05-03 DIAGNOSIS — Z888 Allergy status to other drugs, medicaments and biological substances status: Secondary | ICD-10-CM | POA: Insufficient documentation

## 2019-05-03 DIAGNOSIS — Z87891 Personal history of nicotine dependence: Secondary | ICD-10-CM | POA: Diagnosis not present

## 2019-05-03 DIAGNOSIS — Z791 Long term (current) use of non-steroidal anti-inflammatories (NSAID): Secondary | ICD-10-CM | POA: Diagnosis not present

## 2019-05-03 DIAGNOSIS — Z79890 Hormone replacement therapy (postmenopausal): Secondary | ICD-10-CM | POA: Insufficient documentation

## 2019-05-03 DIAGNOSIS — Z794 Long term (current) use of insulin: Secondary | ICD-10-CM | POA: Insufficient documentation

## 2019-05-03 DIAGNOSIS — Z833 Family history of diabetes mellitus: Secondary | ICD-10-CM | POA: Diagnosis not present

## 2019-05-03 DIAGNOSIS — N179 Acute kidney failure, unspecified: Secondary | ICD-10-CM

## 2019-05-03 DIAGNOSIS — E1043 Type 1 diabetes mellitus with diabetic autonomic (poly)neuropathy: Secondary | ICD-10-CM | POA: Insufficient documentation

## 2019-05-03 DIAGNOSIS — R1084 Generalized abdominal pain: Secondary | ICD-10-CM | POA: Diagnosis not present

## 2019-05-03 DIAGNOSIS — G40909 Epilepsy, unspecified, not intractable, without status epilepticus: Secondary | ICD-10-CM | POA: Diagnosis not present

## 2019-05-03 DIAGNOSIS — N390 Urinary tract infection, site not specified: Secondary | ICD-10-CM | POA: Insufficient documentation

## 2019-05-03 DIAGNOSIS — K3184 Gastroparesis: Secondary | ICD-10-CM | POA: Insufficient documentation

## 2019-05-03 DIAGNOSIS — R739 Hyperglycemia, unspecified: Secondary | ICD-10-CM | POA: Diagnosis present

## 2019-05-03 DIAGNOSIS — Z20828 Contact with and (suspected) exposure to other viral communicable diseases: Secondary | ICD-10-CM | POA: Diagnosis not present

## 2019-05-03 DIAGNOSIS — Z0184 Encounter for antibody response examination: Secondary | ICD-10-CM | POA: Diagnosis not present

## 2019-05-03 DIAGNOSIS — Z885 Allergy status to narcotic agent status: Secondary | ICD-10-CM | POA: Insufficient documentation

## 2019-05-03 DIAGNOSIS — R52 Pain, unspecified: Secondary | ICD-10-CM | POA: Diagnosis not present

## 2019-05-03 DIAGNOSIS — R42 Dizziness and giddiness: Secondary | ICD-10-CM | POA: Diagnosis not present

## 2019-05-03 DIAGNOSIS — E039 Hypothyroidism, unspecified: Secondary | ICD-10-CM | POA: Diagnosis not present

## 2019-05-03 DIAGNOSIS — I10 Essential (primary) hypertension: Secondary | ICD-10-CM | POA: Insufficient documentation

## 2019-05-03 DIAGNOSIS — E1165 Type 2 diabetes mellitus with hyperglycemia: Secondary | ICD-10-CM | POA: Diagnosis not present

## 2019-05-03 DIAGNOSIS — E109 Type 1 diabetes mellitus without complications: Secondary | ICD-10-CM | POA: Diagnosis present

## 2019-05-03 DIAGNOSIS — F319 Bipolar disorder, unspecified: Secondary | ICD-10-CM | POA: Insufficient documentation

## 2019-05-03 DIAGNOSIS — R0602 Shortness of breath: Secondary | ICD-10-CM | POA: Diagnosis not present

## 2019-05-03 DIAGNOSIS — R109 Unspecified abdominal pain: Secondary | ICD-10-CM | POA: Diagnosis present

## 2019-05-03 DIAGNOSIS — I201 Angina pectoris with documented spasm: Secondary | ICD-10-CM | POA: Diagnosis not present

## 2019-05-03 DIAGNOSIS — E1143 Type 2 diabetes mellitus with diabetic autonomic (poly)neuropathy: Secondary | ICD-10-CM | POA: Diagnosis present

## 2019-05-03 HISTORY — DX: Acute kidney failure, unspecified: N17.9

## 2019-05-03 HISTORY — DX: Nausea with vomiting, unspecified: R11.2

## 2019-05-03 LAB — CBC WITH DIFFERENTIAL/PLATELET
Abs Immature Granulocytes: 0.02 10*3/uL (ref 0.00–0.07)
Basophils Absolute: 0 10*3/uL (ref 0.0–0.1)
Basophils Relative: 0 %
Eosinophils Absolute: 0.1 10*3/uL (ref 0.0–0.5)
Eosinophils Relative: 1 %
HCT: 41.7 % (ref 36.0–46.0)
Hemoglobin: 13.9 g/dL (ref 12.0–15.0)
Immature Granulocytes: 0 %
Lymphocytes Relative: 12 %
Lymphs Abs: 0.9 10*3/uL (ref 0.7–4.0)
MCH: 28.1 pg (ref 26.0–34.0)
MCHC: 33.3 g/dL (ref 30.0–36.0)
MCV: 84.4 fL (ref 80.0–100.0)
Monocytes Absolute: 0.5 10*3/uL (ref 0.1–1.0)
Monocytes Relative: 6 %
Neutro Abs: 6 10*3/uL (ref 1.7–7.7)
Neutrophils Relative %: 81 %
Platelets: 326 10*3/uL (ref 150–400)
RBC: 4.94 MIL/uL (ref 3.87–5.11)
RDW: 14 % (ref 11.5–15.5)
WBC: 7.5 10*3/uL (ref 4.0–10.5)
nRBC: 0 % (ref 0.0–0.2)

## 2019-05-03 LAB — BLOOD GAS, VENOUS
Acid-base deficit: 0.5 mmol/L (ref 0.0–2.0)
Bicarbonate: 24.8 mmol/L (ref 20.0–28.0)
O2 Saturation: 35.6 %
Patient temperature: 98.6
pCO2, Ven: 45 mmHg (ref 44.0–60.0)
pH, Ven: 7.359 (ref 7.250–7.430)

## 2019-05-03 LAB — COMPREHENSIVE METABOLIC PANEL
ALT: 23 U/L (ref 0–44)
AST: 23 U/L (ref 15–41)
Albumin: 4.3 g/dL (ref 3.5–5.0)
Alkaline Phosphatase: 88 U/L (ref 38–126)
Anion gap: 10 (ref 5–15)
BUN: 10 mg/dL (ref 6–20)
CO2: 23 mmol/L (ref 22–32)
Calcium: 9.2 mg/dL (ref 8.9–10.3)
Chloride: 98 mmol/L (ref 98–111)
Creatinine, Ser: 1.04 mg/dL — ABNORMAL HIGH (ref 0.44–1.00)
GFR calc Af Amer: >60 mL/min (ref >60)
GFR calc non Af Amer: >60 mL/min (ref >60)
Glucose, Bld: 327 mg/dL — ABNORMAL HIGH (ref 70–99)
Potassium: 4.6 mmol/L (ref 3.5–5.1)
Sodium: 131 mmol/L — ABNORMAL LOW (ref 135–145)
Total Bilirubin: 0.9 mg/dL (ref 0.3–1.2)
Total Protein: 7.3 g/dL (ref 6.5–8.1)

## 2019-05-03 LAB — URINALYSIS, ROUTINE W REFLEX MICROSCOPIC
Bilirubin Urine: NEGATIVE
Glucose, UA: >=500 mg/dL — AB
Hgb urine dipstick: NEGATIVE
Ketones, ur: 5 mg/dL — AB
Leukocytes,Ua: NEGATIVE
Nitrite: NEGATIVE
Protein, ur: NEGATIVE mg/dL
Specific Gravity, Urine: 1.023 (ref 1.005–1.030)
pH: 6 (ref 5.0–8.0)

## 2019-05-03 LAB — RAPID URINE DRUG SCREEN, HOSP PERFORMED
Amphetamines: NOT DETECTED
Barbiturates: NOT DETECTED
Benzodiazepines: NOT DETECTED
Cocaine: NOT DETECTED
Opiates: NOT DETECTED
Tetrahydrocannabinol: POSITIVE — AB

## 2019-05-03 LAB — SARS CORONAVIRUS 2 BY RT PCR (HOSPITAL ORDER, PERFORMED IN ~~LOC~~ HOSPITAL LAB): SARS Coronavirus 2: NEGATIVE

## 2019-05-03 LAB — CBG MONITORING, ED
Glucose-Capillary: 329 mg/dL — ABNORMAL HIGH (ref 70–99)
Glucose-Capillary: 69 mg/dL — ABNORMAL LOW (ref 70–99)

## 2019-05-03 LAB — LACTIC ACID, PLASMA: Lactic Acid, Venous: 1.5 mmol/L (ref 0.5–1.9)

## 2019-05-03 MED ORDER — IOHEXOL 300 MG/ML  SOLN
100.0000 mL | Freq: Once | INTRAMUSCULAR | Status: AC | PRN
  Administered 2019-05-03: 100 mL via INTRAVENOUS

## 2019-05-03 MED ORDER — SODIUM CHLORIDE 0.9% FLUSH: 3.0000 mL | Freq: Once | INTRAVENOUS | Status: DC

## 2019-05-03 MED ORDER — SODIUM CHLORIDE 0.9 % IV BOLUS
1000.0000 mL | Freq: Once | INTRAVENOUS | Status: AC
  Administered 2019-05-03: 1000 mL via INTRAVENOUS

## 2019-05-03 MED ORDER — INSULIN NPH (HUMAN) (ISOPHANE) 100 UNIT/ML ~~LOC~~ SUSP
10.0000 [IU] | Freq: Two times a day (BID) | SUBCUTANEOUS | Status: DC
  Filled 2019-05-03: qty 10

## 2019-05-03 MED ORDER — METOCLOPRAMIDE HCL 5 MG/ML IJ SOLN
5.0000 mg | Freq: Once | INTRAMUSCULAR | Status: AC
  Administered 2019-05-03: 5 mg via INTRAVENOUS
  Filled 2019-05-03: qty 2

## 2019-05-03 MED ORDER — SODIUM CHLORIDE (PF) 0.9 % IJ SOLN
INTRAMUSCULAR | Status: AC
  Administered 2019-05-03: 19:00:00
  Filled 2019-05-03: qty 50

## 2019-05-03 MED ORDER — ONDANSETRON HCL 4 MG/2ML IJ SOLN
4.0000 mg | Freq: Four times a day (QID) | INTRAMUSCULAR | Status: DC | PRN
  Administered 2019-05-04: 4 mg via INTRAVENOUS
  Filled 2019-05-03: qty 2

## 2019-05-03 MED ORDER — ACETAMINOPHEN 650 MG RE SUPP: 650.0000 mg | Freq: Four times a day (QID) | RECTAL | Status: DC | PRN

## 2019-05-03 MED ORDER — LEVOTHYROXINE SODIUM 50 MCG PO TABS
50.0000 ug | ORAL_TABLET | Freq: Every day | ORAL | Status: DC
  Administered 2019-05-04: 50 ug via ORAL
  Filled 2019-05-03: qty 1

## 2019-05-03 MED ORDER — INSULIN ASPART 100 UNIT/ML ~~LOC~~ SOLN
0.0000 [IU] | SUBCUTANEOUS | Status: DC
  Administered 2019-05-04: 9 [IU] via SUBCUTANEOUS
  Administered 2019-05-04: 5 [IU] via SUBCUTANEOUS
  Filled 2019-05-03: qty 0.09

## 2019-05-03 MED ORDER — NITROGLYCERIN 0.4 MG SL SUBL: 0.4000 mg | SUBLINGUAL_TABLET | SUBLINGUAL | Status: DC | PRN

## 2019-05-03 MED ORDER — IOHEXOL 300 MG/ML  SOLN
30.0000 mL | Freq: Once | INTRAMUSCULAR | Status: AC
  Administered 2019-05-03: 30 mL via ORAL

## 2019-05-03 MED ORDER — FENTANYL CITRATE (PF) 100 MCG/2ML IJ SOLN
50.0000 ug | Freq: Once | INTRAMUSCULAR | Status: AC
  Administered 2019-05-03: 50 ug via INTRAVENOUS
  Filled 2019-05-03: qty 2

## 2019-05-03 MED ORDER — ONDANSETRON HCL 4 MG PO TABS: 4.0000 mg | ORAL_TABLET | Freq: Four times a day (QID) | ORAL | Status: DC | PRN

## 2019-05-03 MED ORDER — OLANZAPINE 5 MG PO TABS: 5.0000 mg | ORAL_TABLET | Freq: Every day | ORAL | Status: DC

## 2019-05-03 MED ORDER — LACTATED RINGERS IV SOLN
INTRAVENOUS | Status: DC
  Administered 2019-05-03 – 2019-05-04 (×2): via INTRAVENOUS

## 2019-05-03 MED ORDER — ACETAMINOPHEN 325 MG PO TABS: 650.0000 mg | ORAL_TABLET | Freq: Four times a day (QID) | ORAL | Status: DC | PRN

## 2019-05-03 MED ORDER — ENOXAPARIN SODIUM 40 MG/0.4ML ~~LOC~~ SOLN: 40.0000 mg | SUBCUTANEOUS | Status: DC

## 2019-05-03 NOTE — ED Notes (Signed)
Patient reports to RN that it is coming up on the anniversary of her child's death. Patient reports this 06/11/23 is 1 year. Pt is tearful with RN and reports she "feels like she's dying inside" Patient reports she lives at home with her other child and feels sad.

## 2019-05-03 NOTE — ED Triage Notes (Signed)
Pt presents to the ED via [facility] coming from a doctor appt. Pt has been treated for a UTI x1 week but has been unable to keep any of the ABX or other meds down due to nausea. Pt also c/o malaise and fatigue. EMS reports pt is type 1 diabetic and their CBG reading was 441. EMS administered 4 mg of zofran and 518mL enroute. VS WNL.

## 2019-05-03 NOTE — ED Provider Notes (Signed)
[facility] DEPT Provider Note   CSN: [id number] Arrival date & time: 05/03/19  1248     History   Chief Complaint Chief Complaint  Patient presents with  . Hyperglycemia  . Urinary Tract Infection  . Fatigue    HPI Karen Hayden is a 43 y.o. female.     HPI Patient presents from PCP.  For the last week is been treated with a culture proven E. coli UTI.  She has been on Bactrim.  However has had increasing nausea and vomiting.  More fatigued.  Sugars up to 400 at home.  She is a type I diabetic.  Also some lower abdominal pain.  Worse on the right lower quadrant.  States she does not know if she has her appendix.  States she states that her boyfriend told her that they took out her appendix when they did her hysterectomy although she still has her right ovary.  Has not had fevers.  States some of the vomiting feels as if it could be from her gastroparesis.  Pain is dull.  Still having urinary symptoms. Past Medical History:  Diagnosis Date  . Bipolar 1 disorder ([place])   . Chronic kidney infection   . Diabetes mellitus type I ([place])   . Diabetic gastroparesis ([place])   . DM (diabetes mellitus) ([place])   . Gastroparesis    Due to diabetes   . Hyperlipidemia   . Hypertension   . Hypothyroidism    Has thyroiditis with subsequent hyperthyroidism so off Synthroid  . Seizure disorder ([place])   . Umbilical hernia     Patient Active Problem List   Diagnosis Date Noted  . Palpitations 07/06/2018  . Acute posttraumatic stress disorder 07/06/2018  . 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 Marijuana dependence ([place]) 02/17/2018  . Diabetic ketoacidosis without coma associated with type 1 diabetes mellitus ([place])   . Protein-calorie malnutrition, severe ([place]) 01/29/2014  . Unspecified constipation 01/26/2014  . Cough 01/26/2014  . Tobacco use disorder 01/26/2014  . Nausea with vomiting 06/21/2013  . Change in bowel habits 06/21/2013  . Diabetic  gastroparesis ([place]) 06/04/2011  . Type 1 diabetes mellitus ([place]) 01/04/2009  . Acute bronchitis 01/04/2009  . SEIZURE DISORDER 01/04/2009    Past Surgical History:  Procedure Laterality Date  . ADENOIDECTOMY    . LEFT HEART CATH AND CORONARY ANGIOGRAPHY N/A 02/18/2018   Procedure: LEFT HEART CATH AND CORONARY ANGIOGRAPHY;  Surgeon: Jettie Booze, MD;  Location: [facility];  Service: Cardiovascular;  Laterality: N/A;  . TONSILLECTOMY    . VAGINAL HYSTERECTOMY     left ovary removed , has her right ovary     OB History    Gravida  2   Para      Term      Preterm      AB      Living  2     SAB  0   TAB  0   Ectopic      Multiple      Live Births               Home Medications    Prior to Admission medications   Medication Sig Start Date End Date Taking? Authorizing Provider  ibuprofen (ADVIL,MOTRIN) 200 MG tablet Take 800 mg by mouth as needed for moderate pain.   Yes [provider]  insulin NPH Human (NOVOLIN N) 100 UNIT/ML injection Inject 10 Units into the skin 2 (two) times  daily before a meal.   Yes [provider]  insulin regular (NOVOLIN R) 100 units/mL injection Inject into the skin See admin instructions. 1 unit per 8 grams of carbs.at meals and to correct high blood sugar.   Yes [provider]  levothyroxine (SYNTHROID) 50 MCG tablet Take 50 mcg by mouth daily before breakfast.   Yes [provider]  nitroGLYCERIN (NITROSTAT) 0.4 MG SL tablet Place 1 tablet (0.4 mg total) under the tongue every 5 (five) minutes as needed for chest pain. 08/12/18  Yes Dorothy Spark, MD  sulfamethoxazole-trimethoprim (BACTRIM DS) 800-160 MG tablet Take 1 tablet by mouth 2 (two) times daily.   Yes [provider]  busPIRone (BUSPAR) 15 MG tablet Take 1/3 tablet p.o. twice daily for 1 week, then take 2/3 tablet p.o. twice daily for 1 week, then take 1 tablet p.o. twice daily Patient not taking: Reported on  05/03/2019 10/12/18   Thayer Headings, PMHNP  busPIRone (BUSPAR) 30 MG tablet TAKE 1 TABLET (30 MG TOTAL) BY MOUTH 2 (TWO) TIMES DAILY. Patient not taking: Reported on 05/03/2019 03/14/19 06/12/19  Thayer Headings, PMHNP  INSULIN SYRINGE .5CC/28G (INS SYRINGE/NEEDLE .5CC/28G) 28G X 1/2" 0.5 ML MISC Use as instructed to administer insulin 05/10/17   Bonnielee Haff, MD  metoprolol tartrate (LOPRESSOR) 25 MG tablet Take 0.5 tablets (12.5 mg total) by mouth 2 (two) times daily. Patient not taking: Reported on 09/19/2018 09/08/18   Dorothy Spark, MD  OLANZapine (ZYPREXA) 5 MG tablet Take 1 tablet (5 mg total) by mouth at bedtime for 30 days. 11/23/18 12/23/18  Thayer Headings, PMHNP  promethazine (PHENERGAN) 25 MG tablet Take 1 tablet (25 mg total) by mouth every 6 (six) hours as needed for nausea or vomiting. Patient not taking: Reported on 05/03/2019 07/07/18   Molpus, Jenny Reichmann, MD    Family History Family History  Problem Relation Age of Onset  . Diabetes Mother   . Heart attack Mother   . Depression Mother   . Anxiety disorder Mother   . Ovarian cancer Paternal Grandmother        great grandmother  . Diabetes Paternal Grandmother   . Mood Disorder Father   . Depression Maternal Grandmother   . Anxiety disorder Maternal Grandmother   . Goiter Maternal Grandmother   . Diabetes Maternal Grandmother   . Depression Paternal Aunt   . Anxiety disorder Paternal Aunt   . Diabetes Paternal Aunt   . Diabetes Paternal Aunt   . Colon cancer Neg Hx     Social History Social History   Tobacco Use  . Smoking status: Former Smoker    Packs/day: 1.00    Years: 13.00    Pack years: 13.00    Types: Cigarettes    Quit date: 02/17/2018    Years since quitting: 1.2  . Smokeless tobacco: Never Used  . Tobacco comment: Tobacco info given  Substance Use Topics  . Alcohol use: Not Currently  . Drug use: Yes    Types: Marijuana    Comment: last use last night, uses daily     Allergies   Seroquel  [quetiapine fumarate], Lipitor [atorvastatin], Codeine, and Isosorbide   Review of Systems Review of Systems  Constitutional: Positive for appetite change and fatigue. Negative for fever.  HENT: Negative for congestion.   Respiratory: Negative for shortness of breath.   Cardiovascular: Negative for chest pain.  Gastrointestinal: Positive for abdominal pain, nausea and vomiting.  Genitourinary: Positive for dysuria.  Musculoskeletal: Negative for back pain.  Skin: Negative for rash.  Neurological: Positive for weakness.  Psychiatric/Behavioral: Negative for confusion.     Physical Exam Updated Vital Signs BP 117/75   Pulse 60   Temp 97.9 F (36.6 C) (Oral)   Resp 18   Ht 5\' 1"  (1.549 m)   Wt 47.6 kg   SpO2 96%   BMI 19.84 kg/m   Physical Exam Vitals signs and nursing note reviewed.  Constitutional:      Appearance: She is ill-appearing. She is not toxic-appearing or diaphoretic.  HENT:     Head: Atraumatic.     Nose: No congestion.  Eyes:     Pupils: Pupils are equal, round, and reactive to light.  Neck:     Musculoskeletal: Neck supple.  Cardiovascular:     Rate and Rhythm: Regular rhythm.  Pulmonary:     Breath sounds: No wheezing, rhonchi or rales.  Abdominal:     Comments: Right lower quadrant tenderness without rebound or guarding  Musculoskeletal:     Right lower leg: No edema.     Left lower leg: No edema.  Skin:    General: Skin is warm.     Capillary Refill: Capillary refill takes less than 2 seconds.  Neurological:     Mental Status: She is alert. Mental status is at baseline.      ED Treatments / Results  Labs (all labs ordered are listed, but only abnormal results are displayed) Labs Reviewed  COMPREHENSIVE METABOLIC PANEL - Abnormal; Notable for the following components:      Result Value   Sodium 131 (*)    Glucose, Bld 327 (*)    Creatinine, Ser 1.04 (*)    All other components within normal limits  URINALYSIS, ROUTINE W REFLEX  MICROSCOPIC - Abnormal; Notable for the following components:   Glucose, UA >=500 (*)    Ketones, ur 5 (*)    Bacteria, UA RARE (*)    All other components within normal limits  CBG MONITORING, ED - Abnormal; Notable for the following components:   Glucose-Capillary 329 (*)    All other components within normal limits  SARS CORONAVIRUS 2 (HOSPITAL ORDER, [facility])  URINE CULTURE  LACTIC ACID, PLASMA  CBC WITH DIFFERENTIAL/PLATELET  BLOOD GAS, VENOUS  LACTIC ACID, PLASMA  I-STAT BETA HCG BLOOD, ED ([facility], [facility], [facility] ONLY)    EKG None  Radiology Dg Chest 2 View  Result Date: 05/03/2019 CLINICAL DATA:  Acute shortness of breath. EXAM: CHEST - 2 VIEW COMPARISON:  02/17/2018 FINDINGS: The cardiomediastinal silhouette is unremarkable. There is no evidence of focal airspace disease, pulmonary edema, suspicious pulmonary nodule/mass, pleural effusion, or pneumothorax. No acute bony abnormalities are identified. IMPRESSION: No active cardiopulmonary disease. Electronically Signed   By: Margarette Canada M.D.   On: 05/03/2019 14:00   Ct Abdomen Pelvis W Contrast  Result Date: 05/03/2019 CLINICAL DATA:  Nausea. UTI. Malaise and fatigue. EXAM: CT ABDOMEN AND PELVIS WITH CONTRAST TECHNIQUE: Multidetector CT imaging of the abdomen and pelvis was performed using the standard protocol following bolus administration of intravenous contrast. CONTRAST:  121mL OMNIPAQUE IOHEXOL 300 MG/ML  SOLN COMPARISON:  10/15/2016 FINDINGS: Lower chest: Unremarkable. Hepatobiliary: No suspicious focal abnormality within the liver parenchyma. There is no evidence for gallstones, gallbladder wall thickening, or pericholecystic fluid. No intrahepatic or extrahepatic biliary dilation. Pancreas: No focal mass lesion. No dilatation of the main duct. No intraparenchymal cyst. No peripancreatic edema. Spleen: No splenomegaly. No focal mass lesion. Adrenals/Urinary Tract: No adrenal  nodule or mass. Kidneys  unremarkable. Stomach/Bowel: Stomach is unremarkable. No gastric wall thickening. No evidence of outlet obstruction. Duodenum is normally positioned as is the ligament of Treitz. No small bowel wall thickening. No small bowel dilatation. The terminal ileum is normal. Appendix best seen on coronal imaging and unremarkable. No gross colonic mass. No colonic wall thickening. Vascular/Lymphatic: No abdominal aortic aneurysm. No abdominal aortic atherosclerotic calcification. There is no gastrohepatic or hepatoduodenal ligament lymphadenopathy. No intraperitoneal or retroperitoneal lymphadenopathy. No pelvic sidewall lymphadenopathy. Reproductive: The uterus is surgically absent. There is no adnexal mass. Other: No intraperitoneal free fluid. Musculoskeletal: No worrisome lytic or sclerotic osseous abnormality. IMPRESSION: No acute findings in the abdomen or pelvis. Specifically, no findings to explain the patient's history of nausea and fatigue. Electronically Signed   By: Misty Stanley M.D.   On: 05/03/2019 18:41    Procedures Procedures (including critical care time)  Medications Ordered in ED Medications  sodium chloride flush (NS) 0.9 % injection 3 mL (3 mLs Intravenous Not Given 05/03/19 1411)  sodium chloride 0.9 % bolus 1,000 mL (0 mLs Intravenous Stopped 05/03/19 1841)  fentaNYL (SUBLIMAZE) injection 50 mcg (50 mcg Intravenous Given 05/03/19 1507)  metoCLOPramide (REGLAN) injection 5 mg (5 mg Intravenous Given 05/03/19 1505)  iohexol (OMNIPAQUE) 300 MG/ML solution 30 mL (30 mLs Oral Contrast Given 05/03/19 1548)  iohexol (OMNIPAQUE) 300 MG/ML solution 100 mL (100 mLs Intravenous Contrast Given 05/03/19 1800)  sodium chloride (PF) 0.9 % injection (  Given by Other 05/03/19 1841)     Initial Impression / Assessment and Plan / ED Course  I have reviewed the triage vital signs and the nursing notes.  Pertinent labs & imaging results that were available during my care of the patient were reviewed by me and  considered in my medical decision making (see chart for details).        Patient with abdominal pain.  Nausea and vomiting.  Recently treated for E. coli UTI that was culture positive.  Now more vomiting.  Also right lower quadrant pain.  Urine does not show infection here.  CT scan done and reassuring.  Continues to vomit.  Will discuss with hospitalist about admission.  However patient apparently told nurse that the anniversary of her child's death is coming up.  Could be a psychiatric component to this.  Final Clinical Impressions(s) / ED Diagnoses   Final diagnoses:  Nausea and vomiting, intractability of vomiting not specified, unspecified vomiting type  Abdominal pain, unspecified abdominal location  Hyperglycemia    ED Discharge Orders    None       Davonna Belling, MD 05/03/19 1955

## 2019-05-03 NOTE — ED Notes (Signed)
Pt cleaned up after incontinence. New gown. New linens.

## 2019-05-03 NOTE — H&P (Signed)
History and Physical    Karen Hayden [id number] DOB: Jan 18, 1976 DOA: 05/03/2019  PCP: Jacelyn Pi, MD  Patient coming from: Home.  Chief Complaint: Nausea vomiting.  HPI: Karen Hayden is a 43 y.o. female with history of diabetes mellitus type 1, hypothyroidism with history of diabetic gastroparesis as per the patient presents to the ER with complaints of persistent nausea vomiting ongoing for last 1 week.  Patient states he was recently diagnosed with UTI and was placed on Bactrim 10 days which he had taken 6 days.  Due to persistent vomiting patient comes to the ER.  Patient admits to smoking marijuana.  ED Course: In the ER due to persistent vomiting patient had a CT abdomen pelvis which was largely unremarkable UA does not show any signs of infection.  Patient was afebrile.  COVID-19 test was negative.  Lab work show glucose of 327 with anion gap of 10.  Patient was started on IV fluids antiemetics and admitted for intractable nausea vomiting.  Review of Systems: As per HPI, rest all negative.   Past Medical History:  Diagnosis Date   Bipolar 1 disorder ([place])    Chronic kidney infection    Diabetes mellitus type I ([place])    Diabetic gastroparesis ([place])    DM (diabetes mellitus) ([place])    Gastroparesis    Due to diabetes    Hyperlipidemia    Hypertension    Hypothyroidism    Has thyroiditis with subsequent hyperthyroidism so off Synthroid   Seizure disorder ([place])    Umbilical hernia     Past Surgical History:  Procedure Laterality Date   ADENOIDECTOMY     LEFT HEART CATH AND CORONARY ANGIOGRAPHY N/A 02/18/2018   Procedure: LEFT HEART CATH AND CORONARY ANGIOGRAPHY;  Surgeon: Jettie Booze, MD;  Location: [facility];  Service: Cardiovascular;  Laterality: N/A;   TONSILLECTOMY     VAGINAL HYSTERECTOMY     left ovary removed , has her right ovary     reports that she quit smoking about 14 months ago. Her smoking use included cigarettes. She  has a 13.00 pack-year smoking history. She has never used smokeless tobacco. She reports previous alcohol use. She reports current drug use. Drug: Marijuana.  Allergies  Allergen Reactions   Seroquel [Quetiapine Fumarate]     Seizure   Lipitor [Atorvastatin]     Attacks joints, inflamation   Codeine Rash   Isosorbide Palpitations    Rapid HR after 1st dose - ?hypotension with rebound tachy / she tolerates prn SL NTG just fine    Family History  Problem Relation Age of Onset   Diabetes Mother    Heart attack Mother    Depression Mother    Anxiety disorder Mother    Ovarian cancer Paternal Grandmother        great grandmother   Diabetes Paternal Grandmother    Mood Disorder Father    Depression Maternal Grandmother    Anxiety disorder Maternal Grandmother    Goiter Maternal Grandmother    Diabetes Maternal Grandmother    Depression Paternal Aunt    Anxiety disorder Paternal Aunt    Diabetes Paternal Aunt    Diabetes Paternal Aunt    Colon cancer Neg Hx     Prior to Admission medications   Medication Sig Start Date End Date Taking? Authorizing Provider  ibuprofen (ADVIL,MOTRIN) 200 MG tablet Take 800 mg by mouth as needed for moderate pain.   Yes [provider]  insulin NPH  Human (NOVOLIN N) 100 UNIT/ML injection Inject 10 Units into the skin 2 (two) times daily before a meal.   Yes [provider]  insulin regular (NOVOLIN R) 100 units/mL injection Inject into the skin See admin instructions. 1 unit per 8 grams of carbs.at meals and to correct high blood sugar.   Yes [provider]  levothyroxine (SYNTHROID) 50 MCG tablet Take 50 mcg by mouth daily before breakfast.   Yes [provider]  nitroGLYCERIN (NITROSTAT) 0.4 MG SL tablet Place 1 tablet (0.4 mg total) under the tongue every 5 (five) minutes as needed for chest pain. 08/12/18  Yes Dorothy Spark, MD  sulfamethoxazole-trimethoprim (BACTRIM DS) 800-160 MG  tablet Take 1 tablet by mouth 2 (two) times daily.   Yes [provider]  busPIRone (BUSPAR) 15 MG tablet Take 1/3 tablet p.o. twice daily for 1 week, then take 2/3 tablet p.o. twice daily for 1 week, then take 1 tablet p.o. twice daily Patient not taking: Reported on 05/03/2019 10/12/18   Thayer Headings, PMHNP  busPIRone (BUSPAR) 30 MG tablet TAKE 1 TABLET (30 MG TOTAL) BY MOUTH 2 (TWO) TIMES DAILY. Patient not taking: Reported on 05/03/2019 03/14/19 06/12/19  Thayer Headings, PMHNP  INSULIN SYRINGE .5CC/28G (INS SYRINGE/NEEDLE .5CC/28G) 28G X 1/2" 0.5 ML MISC Use as instructed to administer insulin 05/10/17   Bonnielee Haff, MD  metoprolol tartrate (LOPRESSOR) 25 MG tablet Take 0.5 tablets (12.5 mg total) by mouth 2 (two) times daily. Patient not taking: Reported on 09/19/2018 09/08/18   Dorothy Spark, MD  OLANZapine (ZYPREXA) 5 MG tablet Take 1 tablet (5 mg total) by mouth at bedtime for 30 days. 11/23/18 12/23/18  Thayer Headings, PMHNP  promethazine (PHENERGAN) 25 MG tablet Take 1 tablet (25 mg total) by mouth every 6 (six) hours as needed for nausea or vomiting. Patient not taking: Reported on 05/03/2019 07/07/18   Molpus, Jenny Reichmann, MD    Physical Exam: Constitutional: Moderately built and nourished. Vitals:   05/03/19 2148 05/03/19 2200 05/03/19 2230 05/03/19 2300  BP: 105/71 110/60 108/63 (!) 120/106  Pulse: (!) 50 (!) 40 (!) 44 (!) 48  Resp: 20     Temp:      TempSrc:      SpO2: 100% 97% 99% 98%  Weight:      Height:       Eyes: Anicteric no pallor. ENMT: No discharge from the ears or nose or mouth. Neck: No mass felt.  No neck rigidity. Respiratory: No rhonchi or crepitations. Cardiovascular: S1-S2 heard. Abdomen: Soft nontender bowel sounds present. Musculoskeletal: No edema. Skin: No rash. Neurologic: Alert awake oriented to time place and person.  Moves all extremities. Psychiatric: Appears normal, affect.   Labs on Admission: I have personally reviewed following labs  and imaging studies  CBC: Recent Labs  Lab 05/03/19 1305  WBC 7.5  NEUTROABS 6.0  HGB 13.9  HCT 41.7  MCV 84.4  PLT [id number]   Basic Metabolic Panel: Recent Labs  Lab 05/03/19 1305  NA 131*  K 4.6  CL 98  CO2 23  GLUCOSE 327*  BUN 10  CREATININE 1.04*  CALCIUM 9.2   GFR: Estimated Creatinine Clearance: 51.9 mL/min (A) (by C-G formula based on SCr of 1.04 mg/dL (H)). Liver Function Tests: Recent Labs  Lab 05/03/19 1305  AST 23  ALT 23  ALKPHOS 88  BILITOT 0.9  PROT 7.3  ALBUMIN 4.3   No results for input(s): LIPASE, AMYLASE in the last 168 hours. No results for  input(s): AMMONIA in the last 168 hours. Coagulation Profile: No results for input(s): INR, PROTIME in the last 168 hours. Cardiac Enzymes: No results for input(s): CKTOTAL, CKMB, CKMBINDEX, TROPONINI in the last 168 hours. BNP (last 3 results) No results for input(s): PROBNP in the last 8760 hours. HbA1C: No results for input(s): HGBA1C in the last 72 hours. CBG: Recent Labs  Lab 05/03/19 1303 05/03/19 2253  GLUCAP 329* 69*   Lipid Profile: No results for input(s): CHOL, HDL, LDLCALC, TRIG, CHOLHDL, LDLDIRECT in the last 72 hours. Thyroid Function Tests: No results for input(s): TSH, T4TOTAL, FREET4, T3FREE, THYROIDAB in the last 72 hours. Anemia Panel: No results for input(s): VITAMINB12, FOLATE, FERRITIN, TIBC, IRON, RETICCTPCT in the last 72 hours. Urine analysis:    Component Value Date/Time   COLORURINE YELLOW 05/03/2019 1435   APPEARANCEUR CLEAR 05/03/2019 1435   LABSPEC 1.023 05/03/2019 1435   PHURINE 6.0 05/03/2019 1435   GLUCOSEU >=500 (A) 05/03/2019 1435   GLUCOSEU NEGATIVE 06/20/2013 1522   HGBUR NEGATIVE 05/03/2019 1435   BILIRUBINUR NEGATIVE 05/03/2019 1435   KETONESUR 5 (A) 05/03/2019 1435   PROTEINUR NEGATIVE 05/03/2019 1435   UROBILINOGEN 0.2 06/21/2015 0404   NITRITE NEGATIVE 05/03/2019 1435   LEUKOCYTESUR NEGATIVE 05/03/2019 1435   Sepsis  Labs: @LABRCNTIP (procalcitonin:4,lacticidven:4) ) Recent Results (from the past 240 hour(s))  SARS Coronavirus 2 [facility] order, Performed in [facility] hospital lab) Nasopharyngeal Nasopharyngeal Swab     Status: None   Collection Time: 05/03/19  1:14 PM   Specimen: Nasopharyngeal Swab  Result Value Ref Range Status   SARS Coronavirus 2 NEGATIVE NEGATIVE Final    Comment: (NOTE) If result is NEGATIVE SARS-CoV-2 target nucleic acids are NOT DETECTED. The SARS-CoV-2 RNA is generally detectable in upper and lower  respiratory specimens during the acute phase of infection. The lowest  concentration of SARS-CoV-2 viral copies this assay can detect is 250  copies / mL. A negative result does not preclude SARS-CoV-2 infection  and should not be used as the sole basis for treatment or other  patient management decisions.  A negative result may occur with  improper specimen collection / handling, submission of specimen other  than nasopharyngeal swab, presence of viral mutation(s) within the  areas targeted by this assay, and inadequate number of viral copies  (<250 copies / mL). A negative result must be combined with clinical  observations, patient history, and epidemiological information. If result is POSITIVE SARS-CoV-2 target nucleic acids are DETECTED. The SARS-CoV-2 RNA is generally detectable in upper and lower  respiratory specimens dur ing the acute phase of infection.  Positive  results are indicative of active infection with SARS-CoV-2.  Clinical  correlation with patient history and other diagnostic information is  necessary to determine patient infection status.  Positive results do  not rule out bacterial infection or co-infection with other viruses. If result is PRESUMPTIVE POSTIVE SARS-CoV-2 nucleic acids MAY BE PRESENT.   A presumptive positive result was obtained on the submitted specimen  and confirmed on repeat testing.  While 2019 novel coronavirus  (SARS-CoV-2)  nucleic acids may be present in the submitted sample  additional confirmatory testing may be necessary for epidemiological  and / or clinical management purposes  to differentiate between  SARS-CoV-2 and other Sarbecovirus currently known to infect humans.  If clinically indicated additional testing with an alternate test  methodology [phone]) is advised. The SARS-CoV-2 RNA is generally  detectable in upper and lower respiratory sp ecimens during the acute  phase of  infection. The expected result is Negative. Fact Sheet for Patients:  StrictlyIdeas.no Fact Sheet for Healthcare Providers: BankingDealers.co.za This test is not yet approved or cleared by the [place] FDA and has been authorized for detection and/or diagnosis of SARS-CoV-2 by FDA under an Emergency Use Authorization (EUA).  This EUA will remain in effect (meaning this test can be used) for the duration of the COVID-19 declaration under Section 564(b)(1) of the Act, 21 U.S.C. section 360bbb-3(b)(1), unless the authorization is terminated or revoked sooner. Performed at [facility], [place] [address], [place], [place] [address]      Radiological Exams on Admission: Dg Chest 2 View  Result Date: 05/03/2019 CLINICAL DATA:  Acute shortness of breath. EXAM: CHEST - 2 VIEW COMPARISON:  02/17/2018 FINDINGS: The cardiomediastinal silhouette is unremarkable. There is no evidence of focal airspace disease, pulmonary edema, suspicious pulmonary nodule/mass, pleural effusion, or pneumothorax. No acute bony abnormalities are identified. IMPRESSION: No active cardiopulmonary disease. Electronically Signed   By: Margarette Canada M.D.   On: 05/03/2019 14:00   Ct Abdomen Pelvis W Contrast  Result Date: 05/03/2019 CLINICAL DATA:  Nausea. UTI. Malaise and fatigue. EXAM: CT ABDOMEN AND PELVIS WITH CONTRAST TECHNIQUE: Multidetector CT imaging of the abdomen and pelvis was performed  using the standard protocol following bolus administration of intravenous contrast. CONTRAST:  166mL OMNIPAQUE IOHEXOL 300 MG/ML  SOLN COMPARISON:  10/15/2016 FINDINGS: Lower chest: Unremarkable. Hepatobiliary: No suspicious focal abnormality within the liver parenchyma. There is no evidence for gallstones, gallbladder wall thickening, or pericholecystic fluid. No intrahepatic or extrahepatic biliary dilation. Pancreas: No focal mass lesion. No dilatation of the main duct. No intraparenchymal cyst. No peripancreatic edema. Spleen: No splenomegaly. No focal mass lesion. Adrenals/Urinary Tract: No adrenal nodule or mass. Kidneys unremarkable. Stomach/Bowel: Stomach is unremarkable. No gastric wall thickening. No evidence of outlet obstruction. Duodenum is normally positioned as is the ligament of Treitz. No small bowel wall thickening. No small bowel dilatation. The terminal ileum is normal. Appendix best seen on coronal imaging and unremarkable. No gross colonic mass. No colonic wall thickening. Vascular/Lymphatic: No abdominal aortic aneurysm. No abdominal aortic atherosclerotic calcification. There is no gastrohepatic or hepatoduodenal ligament lymphadenopathy. No intraperitoneal or retroperitoneal lymphadenopathy. No pelvic sidewall lymphadenopathy. Reproductive: The uterus is surgically absent. There is no adnexal mass. Other: No intraperitoneal free fluid. Musculoskeletal: No worrisome lytic or sclerotic osseous abnormality. IMPRESSION: No acute findings in the abdomen or pelvis. Specifically, no findings to explain the patient's history of nausea and fatigue. Electronically Signed   By: Misty Stanley M.D.   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 Intractable nausea vomiting -differentials including  diabetic gastroparesis or marijuana abuse.  We will keep patient on IV fluids and advance diet as tolerated.  Avoid narcotics.  May need Reglan if vomiting does not improve.  Patient advised to not use marijuana. 2. Diabetes mellitus type 1 on Novolin insulin 10 units twice daily.  Hyperglycemia on presentation mildly hypoglycemic now.  Will closely follow CBGs with sliding scale and restart Novolin N when sugars get more stable.  Follow metabolic panel closely. 3. Hypothyroidism on Synthroid. 4. Depression on Zyprexa.   DVT prophylaxis: Lovenox. Code Status: Full code. Family Communication: Discussed with patient. Disposition Plan: Home. Consults called: None. Admission status:  Observation.   Rise Patience MD [facility] Pager [phone].  If 7PM-7AM, please contact night-coverage [email or web address] Password [email or web address]  05/03/2019, 11:29 PM

## 2019-05-04 DIAGNOSIS — E1143 Type 2 diabetes mellitus with diabetic autonomic (poly)neuropathy: Secondary | ICD-10-CM

## 2019-05-04 DIAGNOSIS — K3184 Gastroparesis: Secondary | ICD-10-CM

## 2019-05-04 DIAGNOSIS — R112 Nausea with vomiting, unspecified: Secondary | ICD-10-CM | POA: Diagnosis not present

## 2019-05-04 DIAGNOSIS — E1065 Type 1 diabetes mellitus with hyperglycemia: Secondary | ICD-10-CM | POA: Diagnosis not present

## 2019-05-04 LAB — HEPATIC FUNCTION PANEL
ALT: 21 U/L (ref 0–44)
AST: 20 U/L (ref 15–41)
Albumin: 3.9 g/dL (ref 3.5–5.0)
Alkaline Phosphatase: 76 U/L (ref 38–126)
Bilirubin, Direct: 0.2 mg/dL (ref 0.0–0.2)
Indirect Bilirubin: 0.6 mg/dL (ref 0.3–0.9)
Total Bilirubin: 0.8 mg/dL (ref 0.3–1.2)
Total Protein: 6.3 g/dL — ABNORMAL LOW (ref 6.5–8.1)

## 2019-05-04 LAB — CBC
HCT: 38 % (ref 36.0–46.0)
HCT: 39.9 % (ref 36.0–46.0)
Hemoglobin: 12.2 g/dL (ref 12.0–15.0)
Hemoglobin: 12.9 g/dL (ref 12.0–15.0)
MCH: 27.4 pg (ref 26.0–34.0)
MCH: 27.7 pg (ref 26.0–34.0)
MCHC: 32.1 g/dL (ref 30.0–36.0)
MCHC: 32.3 g/dL (ref 30.0–36.0)
MCV: 85.2 fL (ref 80.0–100.0)
MCV: 85.6 fL (ref 80.0–100.0)
Platelets: 285 10*3/uL (ref 150–400)
Platelets: 321 10*3/uL (ref 150–400)
RBC: 4.46 MIL/uL (ref 3.87–5.11)
RBC: 4.66 MIL/uL (ref 3.87–5.11)
RDW: 14.1 % (ref 11.5–15.5)
RDW: 14.2 % (ref 11.5–15.5)
WBC: 5.9 10*3/uL (ref 4.0–10.5)
WBC: 6.5 10*3/uL (ref 4.0–10.5)
nRBC: 0 % (ref 0.0–0.2)
nRBC: 0 % (ref 0.0–0.2)

## 2019-05-04 LAB — URINE CULTURE: Culture: <10000 — AB

## 2019-05-04 LAB — BASIC METABOLIC PANEL
Anion gap: 11 (ref 5–15)
BUN: 6 mg/dL (ref 6–20)
CO2: 21 mmol/L — ABNORMAL LOW (ref 22–32)
Calcium: 8.8 mg/dL — ABNORMAL LOW (ref 8.9–10.3)
Chloride: 101 mmol/L (ref 98–111)
Creatinine, Ser: 0.82 mg/dL (ref 0.44–1.00)
GFR calc Af Amer: >60 mL/min (ref >60)
GFR calc non Af Amer: >60 mL/min (ref >60)
Glucose, Bld: 229 mg/dL — ABNORMAL HIGH (ref 70–99)
Potassium: 4.1 mmol/L (ref 3.5–5.1)
Sodium: 133 mmol/L — ABNORMAL LOW (ref 135–145)

## 2019-05-04 LAB — CBG MONITORING, ED
Glucose-Capillary: 204 mg/dL — ABNORMAL HIGH (ref 70–99)
Glucose-Capillary: 258 mg/dL — ABNORMAL HIGH (ref 70–99)
Glucose-Capillary: 91 mg/dL (ref 70–99)
Glucose-Capillary: 164 mg/dL — ABNORMAL HIGH (ref 70–99)
Glucose-Capillary: 58 mg/dL — ABNORMAL LOW (ref 70–99)

## 2019-05-04 LAB — CREATININE, SERUM
Creatinine, Ser: 0.8 mg/dL (ref 0.44–1.00)
GFR calc Af Amer: >60 mL/min (ref >60)
GFR calc non Af Amer: >60 mL/min (ref >60)

## 2019-05-04 LAB — GLUCOSE, CAPILLARY: Glucose-Capillary: 415 mg/dL — ABNORMAL HIGH (ref 70–99)

## 2019-05-04 LAB — HEMOGLOBIN A1C
Hgb A1c MFr Bld: 7.8 % — ABNORMAL HIGH (ref 4.8–5.6)
Mean Plasma Glucose: 177.16 mg/dL

## 2019-05-04 LAB — HIV ANTIBODY (ROUTINE TESTING W REFLEX): HIV Screen 4th Generation wRfx: NONREACTIVE

## 2019-05-04 MED ORDER — INSULIN NPH (HUMAN) (ISOPHANE) 100 UNIT/ML ~~LOC~~ SUSP
10.0000 [IU] | Freq: Two times a day (BID) | SUBCUTANEOUS | Status: DC
  Administered 2019-05-04: 10 [IU] via SUBCUTANEOUS

## 2019-05-04 MED ORDER — INSULIN NPH (HUMAN) (ISOPHANE) 100 UNIT/ML ~~LOC~~ SUSP
10.0000 [IU] | Freq: Two times a day (BID) | SUBCUTANEOUS | Status: DC
  Filled 2019-05-04: qty 10

## 2019-05-04 MED ORDER — PROMETHAZINE HCL 25 MG PO TABS
25.0000 mg | ORAL_TABLET | Freq: Four times a day (QID) | ORAL | Status: DC | PRN
  Administered 2019-05-04: 25 mg via ORAL
  Filled 2019-05-04: qty 1

## 2019-05-04 MED ORDER — PROMETHAZINE HCL 25 MG PO TABS: 25.0000 mg | ORAL_TABLET | Freq: Four times a day (QID) | ORAL | 0 refills | Status: DC | PRN

## 2019-05-04 NOTE — Plan of Care (Signed)
  Problem: Education: Goal: Knowledge of General Education information will improve Description: Including pain rating scale, medication(s)/side effects and non-pharmacologic comfort measures 05/04/2019 1429 by Dorita Fray, RN Outcome: Adequate for Discharge 05/04/2019 1055 by Dorita Fray, RN Outcome: Progressing   Problem: Health Behavior/Discharge Planning: Goal: Ability to manage health-related needs will improve 05/04/2019 1429 by Dorita Fray, RN Outcome: Adequate for Discharge 05/04/2019 1055 by Dorita Fray, RN Outcome: Progressing   Problem: Clinical Measurements: Goal: Ability to maintain clinical measurements within normal limits will improve 05/04/2019 1429 by Dorita Fray, RN Outcome: Adequate for Discharge 05/04/2019 1055 by Dorita Fray, RN Outcome: Progressing Goal: Will remain free from infection Outcome: Adequate for Discharge Goal: Diagnostic test results will improve Outcome: Adequate for Discharge Goal: Respiratory complications will improve Outcome: Adequate for Discharge Goal: Cardiovascular complication will be avoided Outcome: Adequate for Discharge   Problem: Activity: Goal: Risk for activity intolerance will decrease 05/04/2019 1429 by Dorita Fray, RN Outcome: Adequate for Discharge 05/04/2019 1055 by Dorita Fray, RN Outcome: Progressing   Problem: Nutrition: Goal: Adequate nutrition will be maintained 05/04/2019 1429 by Dorita Fray, RN Outcome: Adequate for Discharge 05/04/2019 1055 by Dorita Fray, RN Outcome: Progressing   Problem: Coping: Goal: Level of anxiety will decrease 05/04/2019 1429 by Dorita Fray, RN Outcome: Adequate for Discharge 05/04/2019 1055 by Dorita Fray, RN Outcome: Progressing   Problem: Elimination: Goal: Will not experience complications related to bowel motility Outcome: Adequate for Discharge Goal: Will not experience complications related to urinary retention Outcome: Adequate for  Discharge   Problem: Pain Managment: Goal: General experience of comfort will improve Outcome: Adequate for Discharge   Problem: Safety: Goal: Ability to remain free from injury will improve 05/04/2019 1429 by Dorita Fray, RN Outcome: Adequate for Discharge 05/04/2019 1055 by Dorita Fray, RN Outcome: Progressing   Problem: Skin Integrity: Goal: Risk for impaired skin integrity will decrease 05/04/2019 1429 by Dorita Fray, RN Outcome: Adequate for Discharge 05/04/2019 1055 by Dorita Fray, RN Outcome: Progressing

## 2019-05-04 NOTE — ED Notes (Signed)
ED TO INPATIENT HANDOFF REPORT  Name/Age/Gender Fran Lowes 43 y.o. female  Code Status    Code Status Orders  (From admission, onward)         Start     Ordered   05/03/19 2325  Full code  Continuous     05/03/19 2327        Code Status History    Date Active Date Inactive Code Status Order ID Comments User Context   02/17/2018 1149 02/19/2018 1805 Full Code [id number]  Karmen Bongo, MD ED   05/05/2017 1641 05/10/2017 1647 Full Code [id number]  Collene Gobble, MD ED   01/26/2014 1707 01/29/2014 1359 Full Code [id number]  Hosie Poisson, MD Inpatient   Advance Care Planning Activity      Home/SNF/Other Home  Chief Complaint weakness hyperglycemia  Level of Care/Admitting Diagnosis ED Disposition    ED Disposition Condition [facility] Area: [facility] [id number]  Level of Care: Telemetry [5]  Admit to tele based on following criteria: Monitor for Ischemic changes  Covid Evaluation: Asymptomatic Screening Protocol (No Symptoms)  Diagnosis: Nausea & vomiting [id number]  Admitting Physician: Rise Patience [phone]  Attending Physician: Rise Patience [email or web address]  PT Class (Do Not Modify): Observation [104]  PT Acc Code (Do Not Modify): Observation [10022]       Medical History Past Medical History:  Diagnosis Date  . Bipolar 1 disorder ([place])   . Chronic kidney infection   . Diabetes mellitus type I ([place])   . Diabetic gastroparesis ([place])   . DM (diabetes mellitus) ([place])   . Gastroparesis    Due to diabetes   . Hyperlipidemia   . Hypertension   . Hypothyroidism    Has thyroiditis with subsequent hyperthyroidism so off Synthroid  . Seizure disorder ([place])   . Umbilical hernia     Allergies Allergies  Allergen Reactions  . Seroquel [Quetiapine Fumarate]     Seizure  . Lipitor [Atorvastatin]     Attacks joints, inflamation  . Codeine Rash  . Isosorbide Palpitations    Rapid HR after 1st dose - ?hypotension with rebound  tachy / she tolerates prn SL NTG just fine    IV Location/Drains/Wounds Patient Lines/Drains/Airways Status   Active Line/Drains/Airways    Name:   Placement date:   Placement time:   Site:   Days:   Peripheral IV 05/03/19 Left Antecubital   05/03/19    1257    Antecubital   1   Peripheral IV 05/04/19 Forearm   05/04/19    0100    Forearm   less than 1   External Urinary Catheter   05/03/19    1413    -   1          Labs/Imaging Results for orders placed or performed during the hospital encounter of 05/03/19 (from the past 48 hour(s))  CBG monitoring, ED     Status: Abnormal   Collection Time: 05/03/19  1:03 PM  Result Value Ref Range   Glucose-Capillary 329 (H) 70 - 99 mg/dL  Lactic acid, plasma     Status: None   Collection Time: 05/03/19  1:05 PM  Result Value Ref Range   Lactic Acid, Venous 1.5 0.5 - 1.9 mmol/L    Comment: Performed at [facility], [place] [address], [place], [place] [address]  Comprehensive metabolic panel     Status: Abnormal   Collection Time: 05/03/19  1:05 PM  Result Value  Ref Range   Sodium 131 (L) 135 - 145 mmol/L   Potassium 4.6 3.5 - 5.1 mmol/L   Chloride 98 98 - 111 mmol/L   CO2 23 22 - 32 mmol/L   Glucose, Bld 327 (H) 70 - 99 mg/dL   BUN 10 6 - 20 mg/dL   Creatinine, Ser 1.04 (H) 0.44 - 1.00 mg/dL   Calcium 9.2 8.9 - 10.3 mg/dL   Total Protein 7.3 6.5 - 8.1 g/dL   Albumin 4.3 3.5 - 5.0 g/dL   AST 23 15 - 41 U/L   ALT 23 0 - 44 U/L   Alkaline Phosphatase 88 38 - 126 U/L   Total Bilirubin 0.9 0.3 - 1.2 mg/dL   GFR calc non Af Amer >60 >60 mL/min   GFR calc Af Amer >60 >60 mL/min   Anion gap 10 5 - 15    Comment: Performed at [facility], [place] [address] [place], [place] [address]  CBC with Differential     Status: None   Collection Time: 05/03/19  1:05 PM  Result Value Ref Range   WBC 7.5 4.0 - 10.5 K/uL   RBC 4.94 3.87 - 5.11 MIL/uL   Hemoglobin 13.9 12.0 - 15.0 g/dL   HCT 41.7 36.0 - 46.0 %    MCV 84.4 80.0 - 100.0 fL   MCH 28.1 26.0 - 34.0 pg   MCHC 33.3 30.0 - 36.0 g/dL   RDW 14.0 11.5 - 15.5 %   Platelets 326 150 - 400 K/uL   nRBC 0.0 0.0 - 0.2 %   Neutrophils Relative % 81 %   Neutro Abs 6.0 1.7 - 7.7 K/uL   Lymphocytes Relative 12 %   Lymphs Abs 0.9 0.7 - 4.0 K/uL   Monocytes Relative 6 %   Monocytes Absolute 0.5 0.1 - 1.0 K/uL   Eosinophils Relative 1 %   Eosinophils Absolute 0.1 0.0 - 0.5 K/uL   Basophils Relative 0 %   Basophils Absolute 0.0 0.0 - 0.1 K/uL   Immature Granulocytes 0 %   Abs Immature Granulocytes 0.02 0.00 - 0.07 K/uL    Comment: Performed at [facility], [place] [address] [place], [place] [address]  Blood gas, venous     Status: None   Collection Time: 05/03/19  1:12 PM  Result Value Ref Range   pH, Ven 7.359 7.250 - 7.430   pCO2, Ven 45.0 44.0 - 60.0 mmHg   pO2, Ven BELOW REPORTABLE RANGE 32.0 - 45.0 mmHg    Comment: CRITICAL RESULT CALLED TO, READ BACK BY AND VERIFIED WITH: N.PICKERING MD AT [id number] BY M.JESTER, RRT, RCP ON 05/03/2019    Bicarbonate 24.8 20.0 - 28.0 mmol/L   Acid-base deficit 0.5 0.0 - 2.0 mmol/L   O2 Saturation 35.6 %   Patient temperature 98.6    Collection site VENOUS    Drawn by DRAWN BY RN    Sample type VENOUS     Comment: Performed at [facility], [place] [address], [place],  [address]  SARS Coronavirus 2 [facility] order, Performed in [facility] hospital lab) Nasopharyngeal Nasopharyngeal Swab     Status: None   Collection Time: 05/03/19  1:14 PM   Specimen: Nasopharyngeal Swab  Result Value Ref Range   SARS Coronavirus 2 NEGATIVE NEGATIVE    Comment: (NOTE) If result is NEGATIVE SARS-CoV-2 target nucleic acids are NOT DETECTED. The SARS-CoV-2 RNA is generally detectable in upper and lower  respiratory specimens during the acute phase  of infection. The lowest  concentration of SARS-CoV-2 viral copies this assay can detect is 250  copies / mL. A negative result does not  preclude SARS-CoV-2 infection  and should not be used as the sole basis for treatment or other  patient management decisions.  A negative result may occur with  improper specimen collection / handling, submission of specimen other  than nasopharyngeal swab, presence of viral mutation(s) within the  areas targeted by this assay, and inadequate number of viral copies  (<250 copies / mL). A negative result must be combined with clinical  observations, patient history, and epidemiological information. If result is POSITIVE SARS-CoV-2 target nucleic acids are DETECTED. The SARS-CoV-2 RNA is generally detectable in upper and lower  respiratory specimens dur ing the acute phase of infection.  Positive  results are indicative of active infection with SARS-CoV-2.  Clinical  correlation with patient history and other diagnostic information is  necessary to determine patient infection status.  Positive results do  not rule out bacterial infection or co-infection with other viruses. If result is PRESUMPTIVE POSTIVE SARS-CoV-2 nucleic acids MAY BE PRESENT.   A presumptive positive result was obtained on the submitted specimen  and confirmed on repeat testing.  While 2019 novel coronavirus  (SARS-CoV-2) nucleic acids may be present in the submitted sample  additional confirmatory testing may be necessary for epidemiological  and / or clinical management purposes  to differentiate between  SARS-CoV-2 and other Sarbecovirus currently known to infect humans.  If clinically indicated additional testing with an alternate test  methodology [phone]) is advised. The SARS-CoV-2 RNA is generally  detectable in upper and lower respiratory sp ecimens during the acute  phase of infection. The expected result is Negative. Fact Sheet for Patients:  StrictlyIdeas.no Fact Sheet for Healthcare Providers: BankingDealers.co.za This test is not yet approved or cleared by  the [place] FDA and has been authorized for detection and/or diagnosis of SARS-CoV-2 by FDA under an Emergency Use Authorization (EUA).  This EUA will remain in effect (meaning this test can be used) for the duration of the COVID-19 declaration under Section 564(b)(1) of the Act, 21 U.S.C. section 360bbb-3(b)(1), unless the authorization is terminated or revoked sooner. Performed at [facility], [place] [address] [place], [place] [address]   Urinalysis, Routine w reflex microscopic     Status: Abnormal   Collection Time: 05/03/19  2:35 PM  Result Value Ref Range   Color, Urine YELLOW YELLOW   APPearance CLEAR CLEAR   Specific Gravity, Urine 1.023 1.005 - 1.030   pH 6.0 5.0 - 8.0   Glucose, UA >=500 (A) NEGATIVE mg/dL   Hgb urine dipstick NEGATIVE NEGATIVE   Bilirubin Urine NEGATIVE NEGATIVE   Ketones, ur 5 (A) NEGATIVE mg/dL   Protein, ur NEGATIVE NEGATIVE mg/dL   Nitrite NEGATIVE NEGATIVE   Leukocytes,Ua NEGATIVE NEGATIVE   RBC / HPF 0-5 0 - 5 RBC/hpf   WBC, UA 0-5 0 - 5 WBC/hpf   Bacteria, UA RARE (A) NONE SEEN   Squamous Epithelial / LPF 0-5 0 - 5    Comment: Performed at [facility], [place] [address] [place], [place] [address]  Urine rapid drug screen (hosp performed)     Status: Abnormal   Collection Time: 05/03/19  8:15 PM  Result Value Ref Range   Opiates NONE DETECTED NONE DETECTED   Cocaine NONE DETECTED NONE DETECTED   Benzodiazepines NONE DETECTED NONE DETECTED   Amphetamines NONE DETECTED NONE DETECTED  Tetrahydrocannabinol POSITIVE (A) NONE DETECTED   Barbiturates NONE DETECTED NONE DETECTED    Comment: (NOTE) DRUG SCREEN FOR MEDICAL PURPOSES ONLY.  IF CONFIRMATION IS NEEDED FOR ANY PURPOSE, NOTIFY LAB WITHIN 5 DAYS. LOWEST DETECTABLE LIMITS FOR URINE DRUG SCREEN Drug Class                     Cutoff (ng/mL) Amphetamine and metabolites    1000 Barbiturate and metabolites    200 Benzodiazepine                  [id number] Tricyclics and metabolites     300 Opiates and metabolites        300 Cocaine and metabolites        300 THC                            50 Performed at [facility], [place] [address] [place], [place] [address]   CBG monitoring, ED     Status: Abnormal   Collection Time: 05/03/19 10:53 PM  Result Value Ref Range   Glucose-Capillary 69 (L) 70 - 99 mg/dL  Hemoglobin A1c     Status: Abnormal   Collection Time: 05/03/19 11:22 PM  Result Value Ref Range   Hgb A1c MFr Bld 7.8 (H) 4.8 - 5.6 %    Comment: (NOTE) Pre diabetes:          5.7%-6.4% Diabetes:              >6.4% Glycemic control for   <7.0% adults with diabetes    Mean Plasma Glucose 177.16 mg/dL    Comment: Performed at [facility], [place] [address], [place], [place] [address]  CBC     Status: None   Collection Time: 05/03/19 11:22 PM  Result Value Ref Range   WBC 5.9 4.0 - 10.5 K/uL   RBC 4.46 3.87 - 5.11 MIL/uL   Hemoglobin 12.2 12.0 - 15.0 g/dL   HCT 38.0 36.0 - 46.0 %   MCV 85.2 80.0 - 100.0 fL   MCH 27.4 26.0 - 34.0 pg   MCHC 32.1 30.0 - 36.0 g/dL   RDW 14.1 11.5 - 15.5 %   Platelets 285 150 - 400 K/uL   nRBC 0.0 0.0 - 0.2 %    Comment: Performed at [facility], [place] [address], [place], [place] [address]  Creatinine, serum     Status: None   Collection Time: 05/03/19 11:22 PM  Result Value Ref Range   Creatinine, Ser 0.80 0.44 - 1.00 mg/dL   GFR calc non Af Amer >60 >60 mL/min   GFR calc Af Amer >60 >60 mL/min    Comment: Performed at [facility], [place] [address] [place], [place] [address]  CBG monitoring, ED     Status: Abnormal   Collection Time: 05/04/19  1:45 AM  Result Value Ref Range   Glucose-Capillary 204 (H) 70 - 99 mg/dL  CBG monitoring, ED     Status: Abnormal   Collection Time: 05/04/19  3:50 AM  Result Value Ref Range   Glucose-Capillary 258 (H) 70 - 99 mg/dL  CBG monitoring, ED     Status: None   Collection Time: 05/04/19   6:17 AM  Result Value Ref Range   Glucose-Capillary 91 70 - 99 mg/dL  CBG monitoring, ED     Status: Abnormal   Collection Time: 05/04/19  7:42 AM  Result Value Ref Range   Glucose-Capillary 58 (L) 70 - 99 mg/dL   Dg Chest 2 View  Result Date: 05/03/2019 CLINICAL DATA:  Acute shortness of breath. EXAM: CHEST - 2 VIEW COMPARISON:  02/17/2018 FINDINGS: The cardiomediastinal silhouette is unremarkable. There is no evidence of focal airspace disease, pulmonary edema, suspicious pulmonary nodule/mass, pleural effusion, or pneumothorax. No acute bony abnormalities are identified. IMPRESSION: No active cardiopulmonary disease. Electronically Signed   By: Margarette Canada M.D.   On: 05/03/2019 14:00   Ct Abdomen Pelvis W Contrast  Result Date: 05/03/2019 CLINICAL DATA:  Nausea. UTI. Malaise and fatigue. EXAM: CT ABDOMEN AND PELVIS WITH CONTRAST TECHNIQUE: Multidetector CT imaging of the abdomen and pelvis was performed using the standard protocol following bolus administration of intravenous contrast. CONTRAST:  141mL OMNIPAQUE IOHEXOL 300 MG/ML  SOLN COMPARISON:  10/15/2016 FINDINGS: Lower chest: Unremarkable. Hepatobiliary: No suspicious focal abnormality within the liver parenchyma. There is no evidence for gallstones, gallbladder wall thickening, or pericholecystic fluid. No intrahepatic or extrahepatic biliary dilation. Pancreas: No focal mass lesion. No dilatation of the main duct. No intraparenchymal cyst. No peripancreatic edema. Spleen: No splenomegaly. No focal mass lesion. Adrenals/Urinary Tract: No adrenal nodule or mass. Kidneys unremarkable. Stomach/Bowel: Stomach is unremarkable. No gastric wall thickening. No evidence of outlet obstruction. Duodenum is normally positioned as is the ligament of Treitz. No small bowel wall thickening. No small bowel dilatation. The terminal ileum is normal. Appendix best seen on coronal imaging and unremarkable. No gross colonic mass. No colonic wall thickening.  Vascular/Lymphatic: No abdominal aortic aneurysm. No abdominal aortic atherosclerotic calcification. There is no gastrohepatic or hepatoduodenal ligament lymphadenopathy. No intraperitoneal or retroperitoneal lymphadenopathy. No pelvic sidewall lymphadenopathy. Reproductive: The uterus is surgically absent. There is no adnexal mass. Other: No intraperitoneal free fluid. Musculoskeletal: No worrisome lytic or sclerotic osseous abnormality. IMPRESSION: No acute findings in the abdomen or pelvis. Specifically, no findings to explain the patient's history of nausea and fatigue. Electronically Signed   By: Misty Stanley M.D.   On: 05/03/2019 18:41    Pending Labs Unresulted Labs (From admission, onward)    Start     Ordered   05/10/19 0500  Creatinine, serum  (enoxaparin (LOVENOX)    CrCl >/= 30 ml/min)  Weekly,   R    Comments: while on enoxaparin therapy    05/03/19 2327   05/04/19 [id number]  Basic metabolic panel  Tomorrow morning,   R     05/03/19 2327   05/04/19 0500  CBC  Tomorrow morning,   R     05/03/19 2327   05/04/19 0500  Hepatic function panel  Tomorrow morning,   R     05/03/19 2327   05/03/19 2322  HIV antibody (Routine Testing)  Once,   STAT     05/03/19 2327   05/03/19 1313  Urine culture  ONCE - STAT,   STAT     05/03/19 1312          Vitals/Pain Today's Vitals   05/04/19 0738 05/04/19 0743 05/04/19 0744 05/04/19 0800  BP: 105/71  116/90 (!) 118/49  Pulse: 79 (!) 115 (!) 125 (!) 55  Resp:  (!) 21 19 13   Temp:      TempSrc:      SpO2: 98% 100% 98% 100%  Weight:      Height:      PainSc:        Isolation Precautions No active isolations  Medications Medications  sodium chloride flush (NS) 0.9 %  injection 3 mL (3 mLs Intravenous Not Given 05/03/19 1411)  lactated ringers infusion ( Intravenous New Bag/Given 05/03/19 2303)  nitroGLYCERIN (NITROSTAT) SL tablet 0.4 mg (has no administration in time range)  OLANZapine (ZYPREXA) tablet 5 mg (has no administration in time  range)  levothyroxine (SYNTHROID) tablet 50 mcg (50 mcg Oral Given 05/04/19 0522)  acetaminophen (TYLENOL) tablet 650 mg (has no administration in time range)    Or  acetaminophen (TYLENOL) suppository 650 mg (has no administration in time range)  ondansetron (ZOFRAN) tablet 4 mg ( Oral See Alternative 05/04/19 0252)    Or  ondansetron (ZOFRAN) injection 4 mg (4 mg Intravenous Given 05/04/19 0252)  insulin aspart (novoLOG) injection 0-9 Units (0 Units Subcutaneous Hold 05/04/19 0716)  enoxaparin (LOVENOX) injection 40 mg (40 mg Subcutaneous Refused 05/03/19 2345)  insulin NPH Human (NOVOLIN N) injection 10 Units (0 Units Subcutaneous Hold 05/04/19 0720)  sodium chloride 0.9 % bolus 1,000 mL (0 mLs Intravenous Stopped 05/03/19 1841)  fentaNYL (SUBLIMAZE) injection 50 mcg (50 mcg Intravenous Given 05/03/19 1507)  metoCLOPramide (REGLAN) injection 5 mg (5 mg Intravenous Given 05/03/19 1505)  iohexol (OMNIPAQUE) 300 MG/ML solution 30 mL (30 mLs Oral Contrast Given 05/03/19 1548)  iohexol (OMNIPAQUE) 300 MG/ML solution 100 mL (100 mLs Intravenous Contrast Given 05/03/19 1800)  sodium chloride (PF) 0.9 % injection (  Given by Other 05/03/19 1841)    Mobility walks

## 2019-05-04 NOTE — Discharge Instructions (Signed)
Karen Hayden,  You were in the hospital because of nausea and vomiting. This may be related to your gastroparesis but it could be related to your marijuana use. We discussed this in depth. You will be discharged with a refill of Phenergan.

## 2019-05-04 NOTE — ED Notes (Signed)
Patient began to have anxiety and ask for help and stated that she felt like her blood sugar was getting low.

## 2019-05-04 NOTE — Discharge Summary (Signed)
Physician Discharge Summary  Karen Hayden [id number] DOB: 06/14/1976 DOA: 05/03/2019  PCP: Jacelyn Pi, MD  Admit date: 05/03/2019 Discharge date: 05/04/2019  Admitted From: Home Disposition: Home  Recommendations for Outpatient Follow-up:  1. Follow up with PCP in 1 week 2. Please obtain BMP/CBC in one week 3. Please follow up on the following pending results: None  Home Health: None Equipment/Devices: None  Discharge Condition: Stable CODE STATUS: Full code Diet recommendation: Carb modified   Brief/Interim Summary:  Admission HPI written by Rise Patience, MD   Chief Complaint: Nausea vomiting.  HPI: Karen Hayden is a 43 y.o. female with history of diabetes mellitus type 1, hypothyroidism with history of diabetic gastroparesis as per the patient presents to the ER with complaints of persistent nausea vomiting ongoing for last 1 week.  Patient states he was recently diagnosed with UTI and was placed on Bactrim 10 days which he had taken 6 days.  Due to persistent vomiting patient comes to the ER.  Patient admits to smoking marijuana.  ED Course: In the ER due to persistent vomiting patient had a CT abdomen pelvis which was largely unremarkable UA does not show any signs of infection.  Patient was afebrile.  COVID-19 test was negative.  Lab work show glucose of 327 with anion gap of 10.  Patient was started on IV fluids antiemetics and admitted for intractable nausea vomiting.   Hospital course:  Intractable nausea and vomiting Improved. Patient given IV fluids and antiemetics. Secondary to gastroparesis vs cannabinoid hyperemesis syndrome. Discussed diagnoses with patient. Discharged with phenergan.  Diabetes mellitus, type 1 Blood sugar elevated prior to discharge secondary to missed insulin dose on AM of discharge. Home regimen resumed prior to discharge.  Hypothyroidism Continued Synthroid.   Discharge Diagnoses:  Principal Problem:   Nausea &  vomiting Active Problems:   Type 1 diabetes mellitus (HCC)   Diabetic gastroparesis (HCC)   Coronary artery vasospasm (HCC)   ARF (acute renal failure) [facility])    Discharge Instructions  Discharge Instructions    Call MD for:  persistant nausea and vomiting   Complete by: As directed    Diet - low sodium heart healthy   Complete by: As directed    Increase activity slowly   Complete by: As directed      Allergies as of 05/04/2019      Reactions   Seroquel [quetiapine Fumarate]    Seizure   Lipitor [atorvastatin]    Attacks joints, inflamation   Codeine Rash   Isosorbide Palpitations   Rapid HR after 1st dose - ?hypotension with rebound tachy / she tolerates prn SL NTG just fine      Medication List    STOP taking these medications   busPIRone 15 MG tablet Commonly known as: BUSPAR   busPIRone 30 MG tablet Commonly known as: BUSPAR   metoprolol tartrate 25 MG tablet Commonly known as: LOPRESSOR   sulfamethoxazole-trimethoprim 800-160 MG tablet Commonly known as: BACTRIM DS     TAKE these medications   ibuprofen 200 MG tablet Commonly known as: ADVIL Take 800 mg by mouth as needed for moderate pain.   insulin NPH Human 100 UNIT/ML injection Commonly known as: NOVOLIN N Inject 10 Units into the skin 2 (two) times daily before a meal.   insulin regular 100 units/mL injection Commonly known as: NOVOLIN R Inject into the skin See admin instructions. 1 unit per 8 grams of carbs.at meals and to correct high blood sugar.  INSULIN SYRINGE .5CC/28G 28G X 1/2" 0.5 ML Misc Use as instructed to administer insulin   levothyroxine 50 MCG tablet Commonly known as: SYNTHROID Take 50 mcg by mouth daily before breakfast.   nitroGLYCERIN 0.4 MG SL tablet Commonly known as: NITROSTAT Place 1 tablet (0.4 mg total) under the tongue every 5 (five) minutes as needed for chest pain.   OLANZapine 5 MG tablet Commonly known as: ZyPREXA Take 1 tablet (5 mg total) by mouth at  bedtime for 30 days.   promethazine 25 MG tablet Commonly known as: PHENERGAN Take 1 tablet (25 mg total) by mouth every 6 (six) hours as needed for nausea or vomiting.      Follow-up Information    Jacelyn Pi, MD. Schedule an appointment as soon as possible for a visit in 1 week(s).   Specialty: Endocrinology Contact information: [place] [address] [phone]          Allergies  Allergen Reactions  . Seroquel [Quetiapine Fumarate]     Seizure  . Lipitor [Atorvastatin]     Attacks joints, inflamation  . Codeine Rash  . Isosorbide Palpitations    Rapid HR after 1st dose - ?hypotension with rebound tachy / she tolerates prn SL NTG just fine    Consultations:  None   Procedures/Studies: Dg Chest 2 View  Result Date: 05/03/2019 CLINICAL DATA:  Acute shortness of breath. EXAM: CHEST - 2 VIEW COMPARISON:  02/17/2018 FINDINGS: The cardiomediastinal silhouette is unremarkable. There is no evidence of focal airspace disease, pulmonary edema, suspicious pulmonary nodule/mass, pleural effusion, or pneumothorax. No acute bony abnormalities are identified. IMPRESSION: No active cardiopulmonary disease. Electronically Signed   By: Margarette Canada M.D.   On: 05/03/2019 14:00   Ct Abdomen Pelvis W Contrast  Result Date: 05/03/2019 CLINICAL DATA:  Nausea. UTI. Malaise and fatigue. EXAM: CT ABDOMEN AND PELVIS WITH CONTRAST TECHNIQUE: Multidetector CT imaging of the abdomen and pelvis was performed using the standard protocol following bolus administration of intravenous contrast. CONTRAST:  177mL OMNIPAQUE IOHEXOL 300 MG/ML  SOLN COMPARISON:  10/15/2016 FINDINGS: Lower chest: Unremarkable. Hepatobiliary: No suspicious focal abnormality within the liver parenchyma. There is no evidence for gallstones, gallbladder wall thickening, or pericholecystic fluid. No intrahepatic or extrahepatic biliary dilation. Pancreas: No focal mass lesion. No dilatation of the  main duct. No intraparenchymal cyst. No peripancreatic edema. Spleen: No splenomegaly. No focal mass lesion. Adrenals/Urinary Tract: No adrenal nodule or mass. Kidneys unremarkable. Stomach/Bowel: Stomach is unremarkable. No gastric wall thickening. No evidence of outlet obstruction. Duodenum is normally positioned as is the ligament of Treitz. No small bowel wall thickening. No small bowel dilatation. The terminal ileum is normal. Appendix best seen on coronal imaging and unremarkable. No gross colonic mass. No colonic wall thickening. Vascular/Lymphatic: No abdominal aortic aneurysm. No abdominal aortic atherosclerotic calcification. There is no gastrohepatic or hepatoduodenal ligament lymphadenopathy. No intraperitoneal or retroperitoneal lymphadenopathy. No pelvic sidewall lymphadenopathy. Reproductive: The uterus is surgically absent. There is no adnexal mass. Other: No intraperitoneal free fluid. Musculoskeletal: No worrisome lytic or sclerotic osseous abnormality. IMPRESSION: No acute findings in the abdomen or pelvis. Specifically, no findings to explain the patient's history of nausea and fatigue. Electronically Signed   By: Misty Stanley M.D.   On: 05/03/2019 18:41      Subjective: No nausea/vomiting this morning.  Discharge Exam: Vitals:   05/04/19 0800 05/04/19 0900  BP: (!) 118/49 109/62  Pulse: (!) 55 66  Resp: 13 15  Temp:  97.8 F (  36.6 C)  SpO2: 100% 100%   Vitals:   05/04/19 0743 05/04/19 0744 05/04/19 0800 05/04/19 0900  BP:  116/90 (!) 118/49 109/62  Pulse: (!) 115 (!) 125 (!) 55 66  Resp: (!) 21 19 13 15   Temp:    97.8 F (36.6 C)  TempSrc:    Oral  SpO2: 100% 98% 100% 100%  Weight:      Height:        General: Pt is alert, awake, not in acute distress Cardiovascular: RRR, S1/S2 +, no rubs, no gallops Respiratory: CTA bilaterally, no wheezing, no rhonchi Abdominal: Soft, NT, ND, bowel sounds + Extremities: no edema, no cyanosis    The results of significant  diagnostics from this hospitalization (including imaging, microbiology, ancillary and laboratory) are listed below for reference.     Microbiology: Recent Results (from the past 240 hour(s))  SARS Coronavirus 2 [facility] order, Performed in [facility] hospital lab) Nasopharyngeal Nasopharyngeal Swab     Status: None   Collection Time: 05/03/19  1:14 PM   Specimen: Nasopharyngeal Swab  Result Value Ref Range Status   SARS Coronavirus 2 NEGATIVE NEGATIVE Final    Comment: (NOTE) If result is NEGATIVE SARS-CoV-2 target nucleic acids are NOT DETECTED. The SARS-CoV-2 RNA is generally detectable in upper and lower  respiratory specimens during the acute phase of infection. The lowest  concentration of SARS-CoV-2 viral copies this assay can detect is 250  copies / mL. A negative result does not preclude SARS-CoV-2 infection  and should not be used as the sole basis for treatment or other  patient management decisions.  A negative result may occur with  improper specimen collection / handling, submission of specimen other  than nasopharyngeal swab, presence of viral mutation(s) within the  areas targeted by this assay, and inadequate number of viral copies  (<250 copies / mL). A negative result must be combined with clinical  observations, patient history, and epidemiological information. If result is POSITIVE SARS-CoV-2 target nucleic acids are DETECTED. The SARS-CoV-2 RNA is generally detectable in upper and lower  respiratory specimens dur ing the acute phase of infection.  Positive  results are indicative of active infection with SARS-CoV-2.  Clinical  correlation with patient history and other diagnostic information is  necessary to determine patient infection status.  Positive results do  not rule out bacterial infection or co-infection with other viruses. If result is PRESUMPTIVE POSTIVE SARS-CoV-2 nucleic acids MAY BE PRESENT.   A presumptive positive result was obtained on the  submitted specimen  and confirmed on repeat testing.  While 2019 novel coronavirus  (SARS-CoV-2) nucleic acids may be present in the submitted sample  additional confirmatory testing may be necessary for epidemiological  and / or clinical management purposes  to differentiate between  SARS-CoV-2 and other Sarbecovirus currently known to infect humans.  If clinically indicated additional testing with an alternate test  methodology [phone]) is advised. The SARS-CoV-2 RNA is generally  detectable in upper and lower respiratory sp ecimens during the acute  phase of infection. The expected result is Negative. Fact Sheet for Patients:  StrictlyIdeas.no Fact Sheet for Healthcare Providers: BankingDealers.co.za This test is not yet approved or cleared by the [place] FDA and has been authorized for detection and/or diagnosis of SARS-CoV-2 by FDA under an Emergency Use Authorization (EUA).  This EUA will remain in effect (meaning this test can be used) for the duration of the COVID-19 declaration under Section 564(b)(1) of the Act, 21 U.S.C. section 360bbb-3(b)(1),  unless the authorization is terminated or revoked sooner. Performed at [facility], [place] [address] [place], [place] [address]   Urine culture     Status: Abnormal   Collection Time: 05/03/19  2:35 PM   Specimen: Urine, Random  Result Value Ref Range Status   Specimen Description   Final    URINE, RANDOM Performed at [facility] [address] [place], [place] [address]    Special Requests   Final    NONE Performed at [facility], [place] [address], [place], [place] [address]    Culture (A)  Final    <10,000 COLONIES/mL INSIGNIFICANT GROWTH Performed at [facility] [address] [place],  [address]    Report Status 05/04/2019 FINAL  Final     Labs: BNP (last 3 results) No results for input(s):  BNP in the last 8760 hours. Basic Metabolic Panel: Recent Labs  Lab 05/03/19 1305 05/03/19 2322 05/04/19 0914  NA 131*  --  133*  K 4.6  --  4.1  CL 98  --  101  CO2 23  --  21*  GLUCOSE 327*  --  229*  BUN 10  --  6  CREATININE 1.04* 0.80 0.82  CALCIUM 9.2  --  8.8*   Liver Function Tests: Recent Labs  Lab 05/03/19 1305 05/04/19 0914  AST 23 20  ALT 23 21  ALKPHOS 88 76  BILITOT 0.9 0.8  PROT 7.3 6.3*  ALBUMIN 4.3 3.9   No results for input(s): LIPASE, AMYLASE in the last 168 hours. No results for input(s): AMMONIA in the last 168 hours. CBC: Recent Labs  Lab 05/03/19 1305 05/03/19 2322 05/04/19 0914  WBC 7.5 5.9 6.5  NEUTROABS 6.0  --   --   HGB 13.9 12.2 12.9  HCT 41.7 38.0 39.9  MCV 84.4 85.2 85.6  PLT 326 285 321   Cardiac Enzymes: No results for input(s): CKTOTAL, CKMB, CKMBINDEX, TROPONINI in the last 168 hours. BNP: Invalid input(s): POCBNP CBG: Recent Labs  Lab 05/04/19 0350 05/04/19 0617 05/04/19 0742 05/04/19 0823 05/04/19 1122  GLUCAP 258* 91 58* 164* 415*   D-Dimer No results for input(s): DDIMER in the last 72 hours. Hgb A1c Recent Labs    05/03/19 2322  HGBA1C 7.8*   Lipid Profile No results for input(s): CHOL, HDL, LDLCALC, TRIG, CHOLHDL, LDLDIRECT in the last 72 hours. Thyroid function studies No results for input(s): TSH, T4TOTAL, T3FREE, THYROIDAB in the last 72 hours.  Invalid input(s): FREET3 Anemia work up No results for input(s): VITAMINB12, FOLATE, FERRITIN, TIBC, IRON, RETICCTPCT in the last 72 hours. Urinalysis    Component Value Date/Time   COLORURINE YELLOW 05/03/2019 1435   APPEARANCEUR CLEAR 05/03/2019 1435   LABSPEC 1.023 05/03/2019 1435   PHURINE 6.0 05/03/2019 1435   GLUCOSEU >=500 (A) 05/03/2019 1435   GLUCOSEU NEGATIVE 06/20/2013 1522   HGBUR NEGATIVE 05/03/2019 1435   BILIRUBINUR NEGATIVE 05/03/2019 1435   KETONESUR 5 (A) 05/03/2019 1435   PROTEINUR NEGATIVE 05/03/2019 1435   UROBILINOGEN 0.2  06/21/2015 0404   NITRITE NEGATIVE 05/03/2019 1435   LEUKOCYTESUR NEGATIVE 05/03/2019 1435   Sepsis Labs Invalid input(s): PROCALCITONIN,  WBC,  LACTICIDVEN Microbiology Recent Results (from the past 240 hour(s))  SARS Coronavirus 2 [facility] order, Performed in [facility] hospital lab) Nasopharyngeal Nasopharyngeal Swab     Status: None   Collection Time: 05/03/19  1:14 PM   Specimen: Nasopharyngeal Swab  Result Value Ref Range Status  SARS Coronavirus 2 NEGATIVE NEGATIVE Final    Comment: (NOTE) If result is NEGATIVE SARS-CoV-2 target nucleic acids are NOT DETECTED. The SARS-CoV-2 RNA is generally detectable in upper and lower  respiratory specimens during the acute phase of infection. The lowest  concentration of SARS-CoV-2 viral copies this assay can detect is 250  copies / mL. A negative result does not preclude SARS-CoV-2 infection  and should not be used as the sole basis for treatment or other  patient management decisions.  A negative result may occur with  improper specimen collection / handling, submission of specimen other  than nasopharyngeal swab, presence of viral mutation(s) within the  areas targeted by this assay, and inadequate number of viral copies  (<250 copies / mL). A negative result must be combined with clinical  observations, patient history, and epidemiological information. If result is POSITIVE SARS-CoV-2 target nucleic acids are DETECTED. The SARS-CoV-2 RNA is generally detectable in upper and lower  respiratory specimens dur ing the acute phase of infection.  Positive  results are indicative of active infection with SARS-CoV-2.  Clinical  correlation with patient history and other diagnostic information is  necessary to determine patient infection status.  Positive results do  not rule out bacterial infection or co-infection with other viruses. If result is PRESUMPTIVE POSTIVE SARS-CoV-2 nucleic acids MAY BE PRESENT.   A presumptive positive  result was obtained on the submitted specimen  and confirmed on repeat testing.  While 2019 novel coronavirus  (SARS-CoV-2) nucleic acids may be present in the submitted sample  additional confirmatory testing may be necessary for epidemiological  and / or clinical management purposes  to differentiate between  SARS-CoV-2 and other Sarbecovirus currently known to infect humans.  If clinically indicated additional testing with an alternate test  methodology [phone]) is advised. The SARS-CoV-2 RNA is generally  detectable in upper and lower respiratory sp ecimens during the acute  phase of infection. The expected result is Negative. Fact Sheet for Patients:  StrictlyIdeas.no Fact Sheet for Healthcare Providers: BankingDealers.co.za This test is not yet approved or cleared by the [place] FDA and has been authorized for detection and/or diagnosis of SARS-CoV-2 by FDA under an Emergency Use Authorization (EUA).  This EUA will remain in effect (meaning this test can be used) for the duration of the COVID-19 declaration under Section 564(b)(1) of the Act, 21 U.S.C. section 360bbb-3(b)(1), unless the authorization is terminated or revoked sooner. Performed at [facility], [place] [address] [place], [place] [address]   Urine culture     Status: Abnormal   Collection Time: 05/03/19  2:35 PM   Specimen: Urine, Random  Result Value Ref Range Status   Specimen Description   Final    URINE, RANDOM Performed at [facility] [address] [place], [place] [address]    Special Requests   Final    NONE Performed at [facility], [place] [address], [place], [place] [address]    Culture (A)  Final    <10,000 COLONIES/mL INSIGNIFICANT GROWTH Performed at [facility] [address], [place], [place] [address]    Report Status 05/04/2019 FINAL  Final     SIGNED:   Cordelia Poche, MD [facility] 05/04/2019, 1:19 PM

## 2019-05-04 NOTE — ED Notes (Signed)
Report given to [facility] for [place], [place]

## 2019-05-04 NOTE — Plan of Care (Signed)
  Problem: Education: Goal: Knowledge of General Education information will improve Description Including pain rating scale, medication(s)/side effects and non-pharmacologic comfort measures Outcome: Progressing   Problem: Health Behavior/Discharge Planning: Goal: Ability to manage health-related needs will improve Outcome: Progressing   Problem: Clinical Measurements: Goal: Ability to maintain clinical measurements within normal limits will improve Outcome: Progressing   Problem: Activity: Goal: Risk for activity intolerance will decrease Outcome: Progressing   Problem: Nutrition: Goal: Adequate nutrition will be maintained Outcome: Progressing   Problem: Coping: Goal: Level of anxiety will decrease Outcome: Progressing   Problem: Safety: Goal: Ability to remain free from injury will improve Outcome: Progressing   Problem: Skin Integrity: Goal: Risk for impaired skin integrity will decrease Outcome: Progressing   

## 2019-06-13 DIAGNOSIS — Z23 Encounter for immunization: Secondary | ICD-10-CM | POA: Diagnosis not present

## 2019-06-21 DIAGNOSIS — K3184 Gastroparesis: Secondary | ICD-10-CM | POA: Diagnosis not present

## 2019-06-21 DIAGNOSIS — F329 Major depressive disorder, single episode, unspecified: Secondary | ICD-10-CM | POA: Diagnosis not present

## 2019-06-21 DIAGNOSIS — G609 Hereditary and idiopathic neuropathy, unspecified: Secondary | ICD-10-CM | POA: Diagnosis not present

## 2019-06-21 DIAGNOSIS — E039 Hypothyroidism, unspecified: Secondary | ICD-10-CM | POA: Diagnosis not present

## 2019-06-21 DIAGNOSIS — E109 Type 1 diabetes mellitus without complications: Secondary | ICD-10-CM | POA: Diagnosis not present

## 2019-06-21 DIAGNOSIS — L709 Acne, unspecified: Secondary | ICD-10-CM | POA: Diagnosis not present

## 2019-06-21 DIAGNOSIS — E7800 Pure hypercholesterolemia, unspecified: Secondary | ICD-10-CM | POA: Diagnosis not present

## 2019-07-05 DIAGNOSIS — R300 Dysuria: Secondary | ICD-10-CM | POA: Diagnosis not present

## 2019-11-01 DIAGNOSIS — R350 Frequency of micturition: Secondary | ICD-10-CM | POA: Diagnosis not present

## 2019-11-30 DIAGNOSIS — E039 Hypothyroidism, unspecified: Secondary | ICD-10-CM | POA: Diagnosis not present

## 2019-12-12 DIAGNOSIS — N390 Urinary tract infection, site not specified: Secondary | ICD-10-CM | POA: Diagnosis not present

## 2019-12-12 DIAGNOSIS — R300 Dysuria: Secondary | ICD-10-CM | POA: Diagnosis not present

## 2019-12-20 DIAGNOSIS — K3184 Gastroparesis: Secondary | ICD-10-CM | POA: Diagnosis not present

## 2019-12-20 DIAGNOSIS — F329 Major depressive disorder, single episode, unspecified: Secondary | ICD-10-CM | POA: Diagnosis not present

## 2019-12-20 DIAGNOSIS — E7800 Pure hypercholesterolemia, unspecified: Secondary | ICD-10-CM | POA: Diagnosis not present

## 2019-12-20 DIAGNOSIS — L709 Acne, unspecified: Secondary | ICD-10-CM | POA: Diagnosis not present

## 2019-12-20 DIAGNOSIS — E109 Type 1 diabetes mellitus without complications: Secondary | ICD-10-CM | POA: Diagnosis not present

## 2019-12-20 DIAGNOSIS — E039 Hypothyroidism, unspecified: Secondary | ICD-10-CM | POA: Diagnosis not present

## 2019-12-20 DIAGNOSIS — L680 Hirsutism: Secondary | ICD-10-CM | POA: Diagnosis not present

## 2019-12-20 DIAGNOSIS — R300 Dysuria: Secondary | ICD-10-CM | POA: Diagnosis not present

## 2019-12-20 DIAGNOSIS — R002 Palpitations: Secondary | ICD-10-CM | POA: Diagnosis not present

## 2019-12-20 DIAGNOSIS — N390 Urinary tract infection, site not specified: Secondary | ICD-10-CM | POA: Diagnosis not present

## 2019-12-20 DIAGNOSIS — G609 Hereditary and idiopathic neuropathy, unspecified: Secondary | ICD-10-CM | POA: Diagnosis not present

## 2019-12-22 DIAGNOSIS — R350 Frequency of micturition: Secondary | ICD-10-CM | POA: Diagnosis not present

## 2019-12-22 DIAGNOSIS — R351 Nocturia: Secondary | ICD-10-CM | POA: Diagnosis not present

## 2019-12-22 DIAGNOSIS — N390 Urinary tract infection, site not specified: Secondary | ICD-10-CM | POA: Diagnosis not present

## 2020-02-21 ENCOUNTER — Other Ambulatory Visit: Payer: Self-pay

## 2020-02-21 ENCOUNTER — Ambulatory Visit: Payer: Medicare Other

## 2020-02-21 ENCOUNTER — Ambulatory Visit: Payer: Medicare Other | Admitting: Cardiology

## 2020-02-21 ENCOUNTER — Encounter: Payer: Self-pay | Admitting: Cardiology

## 2020-02-21 ENCOUNTER — Ambulatory Visit: Payer: Self-pay | Admitting: Cardiology

## 2020-02-21 VITALS — BP 153/85 | HR 64 | Ht 61.0 in | Wt 108.0 lb

## 2020-02-21 DIAGNOSIS — R002 Palpitations: Secondary | ICD-10-CM

## 2020-02-21 DIAGNOSIS — Z794 Long term (current) use of insulin: Secondary | ICD-10-CM

## 2020-02-21 DIAGNOSIS — E108 Type 1 diabetes mellitus with unspecified complications: Secondary | ICD-10-CM

## 2020-02-21 DIAGNOSIS — I201 Angina pectoris with documented spasm: Secondary | ICD-10-CM

## 2020-02-21 DIAGNOSIS — F1220 Cannabis dependence, uncomplicated: Secondary | ICD-10-CM

## 2020-02-21 DIAGNOSIS — E039 Hypothyroidism, unspecified: Secondary | ICD-10-CM

## 2020-02-21 DIAGNOSIS — R0609 Other forms of dyspnea: Secondary | ICD-10-CM

## 2020-02-21 DIAGNOSIS — Z87891 Personal history of nicotine dependence: Secondary | ICD-10-CM

## 2020-02-21 DIAGNOSIS — R072 Precordial pain: Secondary | ICD-10-CM

## 2020-02-21 DIAGNOSIS — R55 Syncope and collapse: Secondary | ICD-10-CM

## 2020-02-21 DIAGNOSIS — Z8249 Family history of ischemic heart disease and other diseases of the circulatory system: Secondary | ICD-10-CM

## 2020-02-21 MED ORDER — DILTIAZEM HCL ER COATED BEADS 120 MG PO CP24: 120.0000 mg | ORAL_CAPSULE | Freq: Every day | ORAL | 0 refills | Status: DC

## 2020-02-21 NOTE — Progress Notes (Signed)
Date:  02/21/2020   ID:  Karen Hayden, DOB [birth year], MRN [id number]  PCP:  Jacelyn Pi, MD  Cardiologist:  Rex Kras, DO, [facility] (established care 02/21/2020) Former Cardiology Providers: Dr. Meda Coffee (w/ [facility] 09/08/2018)  REASON FOR CONSULT: Palpitation and Syncope.   REQUESTING PHYSICIAN:  Jacelyn Pi, [place],  [place] [address]  Chief Complaint  Patient presents with  . Palpitations    c/o Chest pain and SOB  . Loss of Consciousness  . New Patient (Initial Visit)    HPI  Karen Hayden is a 44 y.o. female who presents to the office with a chief complaint of " evaluation of chest pain, palpitations, and syncope." She is referred to the office at the request of Jacelyn Pi, MD. Patient's past medical history and cardiovascular risk factors include: Type 1 diabetes mellitus, insulin-dependent, history of coronary vasospasm, hypothyroidism, former smoker, current marijuana use, family history of premature coronary artery disease.  Patient was originally under the care of Dr. Ottie Glazier please refer to her last office note from 09/08/2018.  She is now referred to the office at the request of Dr. Chalmers Cater for the above-mentioned chief complaint.  Chest pain: Patient states she has intermittent chest pain last episode was yesterday, usually presents at the time of initiating physical activity or at rest, last for about 3 to 6 minutes, located substernally and over the left shoulder, intensity is 10 out of 10, feels like a pressure-like sensation "like a person sitting on my chest."  Does not improve with rest and is usually self-limited.  She has had similar symptoms in the past and has undergone left heart catheterization in 2019 and was noted to have coronary artery vasospasm.  She was started on Isordil but was intolerant to it secondary to tachycardia requiring ER visit.  And she was on metoprolol for a short period of time.   Patient states  that the symptoms are similar to what she experienced in the past but now at times also has effort related dyspnea.  She has sublingual nitroglycerin tablets to use for as needed basis but has not required them.  Palpitation:  Patient also is concern of palpitations that she been having for the last several years.  Initially she was diagnosed with hyperthyroidism and they were attributing her palpitations to her underlying thyroid disease.  However, since then she has undergone radioactive iodine treatment and her symptoms of palpitations continue.  She states that they occur at least couple times a day, lasting for couple minutes and not associated with near syncope or syncope events.    Patient states that back in April 2021 she woke up in the middle of the night with a headache and thought it was secondary to low blood sugars given her type 1 diabetes mellitus.  She went to go to the kitchen to grab some sweets.  However, she passed out on her kitchen floor.  She regained consciousness in approximately 30 seconds and she recalls the events leading up to the episode.  She said her prodromal symptoms included decreased vision, palpitations, and diaphoresis.  She did not soil herself and bowel or bladder.  And no recurrent episodes of syncope.  She does have premature coronary artery disease in the family, Mom had MI and CABG at age of 94.   History of coronary artery spasm. Denies prior history of myocardial infarction, congestive heart failure, deep venous thrombosis, pulmonary embolism, stroke, transient ischemic attack.  ALLERGIES: Allergies  Allergen Reactions  . Seroquel [Quetiapine Fumarate]     Seizure  . Lipitor [Atorvastatin]     Attacks joints, inflamation  . Codeine Rash  . Isosorbide Palpitations    Rapid HR after 1st dose - ?hypotension with rebound tachy / she tolerates prn SL NTG just fine    MEDICATION LIST PRIOR TO VISIT: Current Meds  Medication Sig  . aspirin EC 81 MG  tablet Take 81 mg by mouth daily. Swallow whole.  . insulin NPH Human (NOVOLIN N) 100 UNIT/ML injection Inject 16 Units into the skin 2 (two) times daily before a meal.   . insulin regular (NOVOLIN R) 100 units/mL injection Inject into the skin See admin instructions. 1 unit per 8 grams of carbs.at meals and to correct high blood sugar.  . INSULIN SYRINGE .5CC/28G (INS SYRINGE/NEEDLE .5CC/28G) 28G X 1/2" 0.5 ML MISC Use as instructed to administer insulin  . levothyroxine (SYNTHROID) 50 MCG tablet Take 50 mcg by mouth daily before breakfast.  . metoCLOPramide (REGLAN) 10 MG tablet Take 10 mg by mouth every 6 (six) hours as needed for nausea.  . nitrofurantoin (MACRODANTIN) 100 MG capsule Take 100 mg by mouth daily.  . nitroGLYCERIN (NITROSTAT) 0.4 MG SL tablet Place 1 tablet (0.4 mg total) under the tongue every 5 (five) minutes as needed for chest pain.  Marland Kitchen OLANZapine (ZYPREXA) 5 MG tablet Take 1 tablet (5 mg total) by mouth at bedtime for 30 days.  . promethazine (PHENERGAN) 25 MG tablet Take 1 tablet (25 mg total) by mouth every 6 (six) hours as needed for nausea or vomiting.  . [DISCONTINUED] ibuprofen (ADVIL,MOTRIN) 200 MG tablet Take 800 mg by mouth as needed for moderate pain.     PAST MEDICAL HISTORY: Past Medical History:  Diagnosis Date  . Bipolar 1 disorder ([place])   . Chronic kidney infection   . Coronary artery spasm ([place])   . Diabetes mellitus type I ([place])   . Diabetic gastroparesis ([place])   . DM (diabetes mellitus) ([place])   . Gastroparesis    Due to diabetes   . Hyperlipidemia   . Hypertension   . Hypothyroidism    Has thyroiditis with subsequent hyperthyroidism so off Synthroid  . Seizure disorder ([place])   . Umbilical hernia     PAST SURGICAL HISTORY: Past Surgical History:  Procedure Laterality Date  . ADENOIDECTOMY    . CARDIAC CATHETERIZATION    . LEFT HEART CATH AND CORONARY ANGIOGRAPHY N/A 02/18/2018   Procedure: LEFT HEART CATH AND CORONARY ANGIOGRAPHY;  Surgeon:  Jettie Booze, MD;  Location: [facility];  Service: Cardiovascular;  Laterality: N/A;  . TONSILLECTOMY    . VAGINAL HYSTERECTOMY     left ovary removed , has her right ovary    FAMILY HISTORY: The patient family history includes Anxiety disorder in her maternal grandmother, mother, and paternal aunt; Depression in her maternal grandmother, mother, and paternal aunt; Diabetes in her maternal grandmother, mother, paternal aunt, paternal aunt, and paternal grandmother; Goiter in her maternal grandmother; Heart attack in her mother; Mood Disorder in her father; Ovarian cancer in her paternal grandmother; Pulmonary embolism in her daughter; Thyroid disease in her father.  SOCIAL HISTORY:  The patient  reports that she quit smoking about 2 years ago. Her smoking use included cigarettes. She has a 13.00 pack-year smoking history. She has never used smokeless tobacco. She reports previous alcohol use. She reports current drug use. Drug: Marijuana.  REVIEW OF SYSTEMS: Review of  Systems  Constitutional: Negative for chills and fever.  HENT: Negative for hoarse voice and nosebleeds.   Eyes: Negative for discharge, double vision and pain.  Cardiovascular: Positive for chest pain and palpitations. Negative for claudication, dyspnea on exertion, leg swelling, near-syncope, orthopnea, paroxysmal nocturnal dyspnea and syncope.  Respiratory: Positive for shortness of breath. Negative for hemoptysis.   Musculoskeletal: Negative for muscle cramps and myalgias.  Gastrointestinal: Negative for abdominal pain, constipation, diarrhea, hematemesis, hematochezia, melena, nausea and vomiting.  Neurological: Negative for dizziness and light-headedness.    PHYSICAL EXAM: Vitals with BMI 02/21/2020 05/04/2019 05/04/2019  Height 5\' 1"  - -  Weight 108 lbs - -  BMI 89.38 - -  Systolic 101 751 025  Diastolic 85 62 49  Pulse 64 66 55  Some encounter information is confidential and restricted. Go to Review  Flowsheets activity to see all data.   Orthostatic VS for the past 72 hrs (Last 3 readings):  Orthostatic BP Patient Position BP Location Cuff Size Orthostatic Pulse  02/21/20 1202 141/87 Standing Left Arm Normal 71  02/21/20 1201 155/84 Sitting Left Arm Normal 64  02/21/20 1200 137/73 Supine Left Arm Normal 53   CONSTITUTIONAL: Well-developed and well-nourished. No acute distress.  SKIN: Skin is warm and dry. No rash noted. No cyanosis. No pallor. No jaundice HEAD: Normocephalic and atraumatic.  EYES: No scleral icterus MOUTH/THROAT: Moist oral membranes.  NECK: No JVD present. No thyromegaly noted. No carotid bruits  LYMPHATIC: No visible cervical adenopathy.  CHEST Normal respiratory effort. No intercostal retractions  LUNGS: Clear to auscultation bilaterally.  No stridor. No wheezes. No rales.  CARDIOVASCULAR: Regular rate and rhythm, positive S1-S2, no murmurs rubs or gallops appreciated. ABDOMINAL: No apparent ascites.  EXTREMITIES: No peripheral edema  HEMATOLOGIC: No significant bruising NEUROLOGIC: Oriented to person, place, and time. Nonfocal. Normal muscle tone.  PSYCHIATRIC: Normal mood and affect. Normal behavior. Cooperative  CARDIAC DATABASE: EKG: 02/21/2020:Sinus  Rhythm, 64bpm, Poor R-wave progression, consider old anterior infarct.  Echocardiogram: 02/18/2018: LVEF 85-27%, normal diastolic function, trivial MR, moderately dilated left atrium.  Stress Testing: None  Heart Catheterization: 02/18/2018:  Prox RCA lesion is 10% stenosed. This may have been catheter induced spasm.  The left ventricular systolic function is normal.  LV end diastolic pressure is normal.  The left ventricular ejection fraction is 55-65% by visual estimate.  There is no aortic valve stenosis.   Continue preventive therapy.  Smoking cessation.  If sx persist, consider medical therapy for vasospasm.   30-day cardiac event monitor: Sinus bradycardia to sinus tachycardia. Rare  PAcs. No arrhythmias or pauses. Normal 30 day event monitor. Symptoms don't correlate with any significant arrhythmias.  LABORATORY DATA: CBC Latest Ref Rng & Units 05/04/2019 05/03/2019 05/03/2019  WBC 4.0 - 10.5 K/uL 6.5 5.9 7.5  Hemoglobin 12.0 - 15.0 g/dL 12.9 12.2 13.9  Hematocrit 36 - 46 % 39.9 38.0 41.7  Platelets 150 - 400 K/uL 321 285 326    CMP Latest Ref Rng & Units 05/04/2019 05/03/2019 05/03/2019  Glucose 70 - 99 mg/dL 229(H) - 327(H)  BUN 6 - 20 mg/dL 6 - 10  Creatinine 0.44 - 1.00 mg/dL 0.82 0.80 1.04(H)  Sodium 135 - 145 mmol/L 133(L) - 131(L)  Potassium 3.5 - 5.1 mmol/L 4.1 - 4.6  Chloride 98 - 111 mmol/L 101 - 98  CO2 22 - 32 mmol/L 21(L) - 23  Calcium 8.9 - 10.3 mg/dL 8.8(L) - 9.2  Total Protein 6.5 - 8.1 g/dL 6.3(L) - 7.3  Total Bilirubin  0.3 - 1.2 mg/dL 0.8 - 0.9  Alkaline Phos 38 - 126 U/L 76 - 88  AST 15 - 41 U/L 20 - 23  ALT 0 - 44 U/L 21 - 23    Lipid Panel     Component Value Date/Time   CHOL 129 02/18/2018 0310   TRIG 140 02/18/2018 0310   HDL 38 (L) 02/18/2018 0310   CHOLHDL 3.4 02/18/2018 0310   VLDL 28 02/18/2018 0310   LDLCALC 63 02/18/2018 0310  HEMOGLOBIN A1C Lab Results  Component Value Date   HGBA1C 7.8 (H) 05/03/2019   MPG 177.16 05/03/2019    IMPRESSION:    ICD-10-CM   1. Precordial pain  R07.2 PCV ECHOCARDIOGRAM COMPLETE    PCV MYOCARDIAL PERFUSION WO LEXISCAN  2. Dyspnea on exertion  R06.00 PCV ECHOCARDIOGRAM COMPLETE    PCV MYOCARDIAL PERFUSION WO LEXISCAN  3. Palpitations  R00.2 EKG 12-Lead    diltiazem (CARDIZEM CD) 120 MG 24 hr capsule    EXTERNAL ECG MONITOR (UP TO 30 DAYS) ROCT  4. Syncope and collapse  R55 EXTERNAL ECG MONITOR (UP TO 30 DAYS) ROCT  5. Coronary artery vasospasm (H[facility])  I20.1 diltiazem (CARDIZEM CD) 120 MG 24 hr capsule    PCV MYOCARDIAL PERFUSION WO LEXISCAN  6. Type 1 diabetes mellitus with complication (H[facility])  Z61.0 PCV MYOCARDIAL PERFUSION WO LEXISCAN    Lipid Panel With LDL/HDL Ratio  7. Family history of  premature CAD  Z82.49   8. Long-term insulin use (H[facility])  Z79.4   9. Hypothyroidism, unspecified type  E03.9   10. Former smoker  Z87.891   4. Marijuana dependence (H[facility])  F12.20      RECOMMENDATIONS: Karen Hayden is a 45 y.o. female whose past medical history and cardiac risk factors include: Type 1 diabetes mellitus, insulin-dependent, history of coronary vasospasm, hypothyroidism, former smoker, current marijuana use, family history of premature coronary artery disease.  Precordial chest pain and dyspnea on exertion:  Patient symptoms of chest pain have both typical and atypical features but given her multiple cardiovascular risk factors including type 1 diabetes insulin-dependent, family history of premature coronary artery disease, and former smoker recommend an ischemic evaluation.  Check EKG  Echocardiogram will be ordered to evaluate for structural heart disease and left ventricular systolic function.  Nuclear stress test recommended to evaluate for reversible ischemia.  Results of the left heart catheterization 2019 reviewed with the patient.  We will start diltiazem 120 mg p.o. daily.  Did not choose to start beta-blocker therapy as patient is undergoing a lot of stress (crying at the visit) as she lost her daughter recently in September 2019 at the age of 54 and patient is caregiver for both grandkids.   History of syncope:  Patient had a syncopal event of back in April 2021.  No work-up was performed at that time but based on history it appears to be vasovagal in etiology.   We will still consider 14-day mobile cardiac ambulatory telemetry to evaluate for underlying arrhythmia.  Coronary vasospasm: Initiate diltiazem 120 mg p.o. daily.  Patient did not tolerate the Isordil in the past.  May even consider Norvasc at the next office visit.  Family history of premature coronary artery disease:  For reasons unknown patient is currently not on statin therapy given the fact  she was diagnosed with insulin-dependent type 1 diabetes more than 15 years ago.  Check fasting lipid profile.  Patient would like to discuss this further with her endocrinologist prior to starting statin  therapy.   Former smoker: Educated on the importance of continued smoking cessation.  FINAL MEDICATION LIST END OF ENCOUNTER: Meds ordered this encounter  Medications  . diltiazem (CARDIZEM CD) 120 MG 24 hr capsule    Sig: Take 1 capsule (120 mg total) by mouth daily.    Dispense:  30 capsule    Refill:  0      Current Outpatient Medications:  .  aspirin EC 81 MG tablet, Take 81 mg by mouth daily. Swallow whole., Disp: , Rfl:  .  insulin NPH Human (NOVOLIN N) 100 UNIT/ML injection, Inject 16 Units into the skin 2 (two) times daily before a meal. , Disp: , Rfl:  .  insulin regular (NOVOLIN R) 100 units/mL injection, Inject into the skin See admin instructions. 1 unit per 8 grams of carbs.at meals and to correct high blood sugar., Disp: , Rfl:  .  INSULIN SYRINGE .5CC/28G (INS SYRINGE/NEEDLE .5CC/28G) 28G X 1/2" 0.5 ML MISC, Use as instructed to administer insulin, Disp: 60 each, Rfl: 1 .  levothyroxine (SYNTHROID) 50 MCG tablet, Take 50 mcg by mouth daily before breakfast., Disp: , Rfl:  .  metoCLOPramide (REGLAN) 10 MG tablet, Take 10 mg by mouth every 6 (six) hours as needed for nausea., Disp: , Rfl:  .  nitrofurantoin (MACRODANTIN) 100 MG capsule, Take 100 mg by mouth daily., Disp: , Rfl:  .  nitroGLYCERIN (NITROSTAT) 0.4 MG SL tablet, Place 1 tablet (0.4 mg total) under the tongue every 5 (five) minutes as needed for chest pain., Disp: 30 tablet, Rfl: 3 .  OLANZapine (ZYPREXA) 5 MG tablet, Take 1 tablet (5 mg total) by mouth at bedtime for 30 days., Disp: 30 tablet, Rfl: 2 .  promethazine (PHENERGAN) 25 MG tablet, Take 1 tablet (25 mg total) by mouth every 6 (six) hours as needed for nausea or vomiting., Disp: 20 tablet, Rfl: 0 .  diltiazem (CARDIZEM CD) 120 MG 24 hr capsule, Take 1  capsule (120 mg total) by mouth daily., Disp: 30 capsule, Rfl: 0  Orders Placed This Encounter  Procedures  . Lipid Panel With LDL/HDL Ratio  . EXTERNAL ECG MONITOR (UP TO 30 DAYS) ROCT  . PCV MYOCARDIAL PERFUSION WO LEXISCAN  . EKG 12-Lead  . PCV ECHOCARDIOGRAM COMPLETE    There are no Patient Instructions on file for this visit.   --Continue cardiac medications as reconciled in final medication list. --Return in about 4 weeks (around 03/20/2020) for review test results., re-evaluation of symptoms.. Or sooner if needed. --Continue follow-up with your primary care physician regarding the management of your other chronic comorbid conditions.  Patient's questions and concerns were addressed to her satisfaction. She voices understanding of the instructions provided during this encounter.   This note was created using a voice recognition software as a result there may be grammatical errors inadvertently enclosed that do not reflect the nature of this encounter. Every attempt is made to correct such errors.  Rex Kras, [place], [facility]  Pager: [phone] Office: [phone]

## 2020-02-22 ENCOUNTER — Other Ambulatory Visit: Payer: Self-pay

## 2020-02-22 DIAGNOSIS — E108 Type 1 diabetes mellitus with unspecified complications: Secondary | ICD-10-CM

## 2020-02-27 ENCOUNTER — Other Ambulatory Visit: Payer: Self-pay

## 2020-02-27 ENCOUNTER — Telehealth: Payer: Self-pay | Admitting: Cardiology

## 2020-02-27 ENCOUNTER — Ambulatory Visit: Payer: Self-pay | Admitting: Cardiology

## 2020-02-27 ENCOUNTER — Ambulatory Visit: Payer: Medicare Other

## 2020-02-27 VITALS — BP 124/76 | HR 81

## 2020-02-27 DIAGNOSIS — R079 Chest pain, unspecified: Secondary | ICD-10-CM

## 2020-02-27 DIAGNOSIS — R072 Precordial pain: Secondary | ICD-10-CM

## 2020-02-27 DIAGNOSIS — R42 Dizziness and giddiness: Secondary | ICD-10-CM

## 2020-02-27 LAB — GLUCOSE, POCT (MANUAL RESULT ENTRY): POC Glucose: 171 mg/dl — AB (ref 70–99)

## 2020-02-27 NOTE — Telephone Encounter (Addendum)
Patient came in to [facility] cardiovascular for a routine echocardiogram as ordered at the last office visit.  While she was in the lobby patient started having chest pain mostly located substernally, shortness of breath, lightheadedness, dizziness.   Blood pressures were performed within the lobby and her systolic blood pressure 633/35KTGY and Pulse of 93bpm.  Patient was wheeled back to the room for further evaluation.  Patient had an EKG which showed normal sinus rhythm without ST segment elevation.  Since she is to type I diabetic POC glucose was checked and patient was not hypoglycemic.  Patient rested for 5 to 10 minutes her symptoms improved.  She continues to have mild chest discomfort for which she was given sublingual nitroglycerin tablets 0.4 mg p.o. x1.  Her symptoms resolved.  She was recommended to go to the ER for further evaluation.  She refused EMS transfer.  Shared decision was the patient's daughter would come to the office and take her to the ER herself further evaluation.  Review of electronic medical records around 5 PM note that patient still not in the ER.  Called both the patient and her daughter but nobody picked up the phone calls.  Left a message on the patient's voicemail.  Rex Kras, [place], [facility]  Pager: [phone] Office: [phone]

## 2020-02-28 ENCOUNTER — Telehealth: Payer: Self-pay

## 2020-02-28 NOTE — Telephone Encounter (Signed)
I called the patient at the request of Dr. Terri Skains. Pt did not answer. I asked her to call back to see how she was feeling since yesterday's episode at our office.

## 2020-02-29 NOTE — Telephone Encounter (Signed)
Patient stated that she feels much better today, no fast heartbeat, no SOB,  and no CP, just soreness. Patient stated that she is going to continue taking her Diltiazem  ER to give her the medication a chance to get in her system. Patient has a stress test on Tuesday and wants to proceed with that and the Echo she was not able to have done when she came in.

## 2020-02-29 NOTE — Telephone Encounter (Signed)
That should be fine. 

## 2020-03-04 NOTE — Addendum Note (Signed)
Addended by: Oran Rein on: 03/04/2020 12:35 PM   Modules accepted: Level of Service

## 2020-03-05 ENCOUNTER — Other Ambulatory Visit: Payer: Medicare Other

## 2020-03-07 ENCOUNTER — Other Ambulatory Visit: Payer: Self-pay

## 2020-03-07 ENCOUNTER — Ambulatory Visit: Payer: Medicare Other

## 2020-03-18 NOTE — Progress Notes (Signed)
Good to hear. Thanks for the update.

## 2020-03-25 ENCOUNTER — Other Ambulatory Visit: Payer: Self-pay

## 2020-03-25 ENCOUNTER — Ambulatory Visit: Payer: Medicare Other

## 2020-03-25 DIAGNOSIS — R072 Precordial pain: Secondary | ICD-10-CM

## 2020-03-25 DIAGNOSIS — R0609 Other forms of dyspnea: Secondary | ICD-10-CM | POA: Diagnosis not present

## 2020-03-25 DIAGNOSIS — I201 Angina pectoris with documented spasm: Secondary | ICD-10-CM | POA: Diagnosis not present

## 2020-03-25 DIAGNOSIS — E108 Type 1 diabetes mellitus with unspecified complications: Secondary | ICD-10-CM

## 2020-03-27 ENCOUNTER — Other Ambulatory Visit: Payer: Self-pay | Admitting: Cardiology

## 2020-03-27 DIAGNOSIS — I201 Angina pectoris with documented spasm: Secondary | ICD-10-CM

## 2020-03-27 DIAGNOSIS — R002 Palpitations: Secondary | ICD-10-CM

## 2020-04-02 ENCOUNTER — Telehealth: Payer: Self-pay

## 2020-04-02 NOTE — Telephone Encounter (Signed)
-----   Message from [place], [place] sent at 03/31/2020 11:25 PM EDT ----- We will review the results of the stress test at the next office visit.

## 2020-04-02 NOTE — Telephone Encounter (Signed)
Called pt to inform her that she would need a appt to go over her stress test with Dr. Terri Skains. Pt understood

## 2020-04-05 ENCOUNTER — Encounter: Payer: Self-pay | Admitting: Cardiology

## 2020-04-05 ENCOUNTER — Ambulatory Visit: Payer: Medicare Other | Admitting: Cardiology

## 2020-04-05 ENCOUNTER — Other Ambulatory Visit: Payer: Self-pay

## 2020-04-05 VITALS — BP 108/75 | HR 76 | Resp 17 | Ht 61.0 in | Wt 105.8 lb

## 2020-04-05 DIAGNOSIS — Z712 Person consulting for explanation of examination or test findings: Secondary | ICD-10-CM

## 2020-04-05 DIAGNOSIS — R55 Syncope and collapse: Secondary | ICD-10-CM

## 2020-04-05 DIAGNOSIS — Z794 Long term (current) use of insulin: Secondary | ICD-10-CM

## 2020-04-05 DIAGNOSIS — Z87891 Personal history of nicotine dependence: Secondary | ICD-10-CM

## 2020-04-05 DIAGNOSIS — I201 Angina pectoris with documented spasm: Secondary | ICD-10-CM

## 2020-04-05 DIAGNOSIS — R072 Precordial pain: Secondary | ICD-10-CM

## 2020-04-05 DIAGNOSIS — R002 Palpitations: Secondary | ICD-10-CM

## 2020-04-05 DIAGNOSIS — E108 Type 1 diabetes mellitus with unspecified complications: Secondary | ICD-10-CM | POA: Diagnosis not present

## 2020-04-05 DIAGNOSIS — Z8249 Family history of ischemic heart disease and other diseases of the circulatory system: Secondary | ICD-10-CM

## 2020-04-05 NOTE — Progress Notes (Signed)
Date:  04/05/2020   ID:  Karen Hayden, DOB 06-03-1976, MRN [id number]  PCP:  Jacelyn Pi, MD  Cardiologist:  Rex Kras, DO, [facility] (established care 02/21/2020) Former Cardiology Providers: Dr. Meda Coffee (w/ [facility] 09/08/2018)  REASON FOR CONSULT: Palpitation and Syncope.   Date: 04/05/20 Last Office Visit: 02/21/2020  Chief Complaint  Patient presents with  . Precordial pain  . Follow-up  . Results    HPI  Karen Hayden is a 44 y.o. female who presents to the office with a chief complaint of " follow-up for reevaluation of chest pain and review test results." Patient's past medical history and cardiovascular risk factors include: Type 1 diabetes mellitus, insulin-dependent, history of coronary vasospasm, hypothyroidism, former smoker, current marijuana use, family history of premature coronary artery disease.  Patient was originally under the care of Dr. Ottie Glazier please refer to her last office note from 09/08/2018.  She is now referred to the office at the request of Dr. Chalmers Cater for the management of palpitations and history of syncope.  At the last office visit patient was having symptoms of chest discomfort and given her multiple cardiovascular risk factors including premature coronary artery disease in the family and type 1 diabetes mellitus recommended to undergo an echocardiogram and stress test.  Patient is informed that the LVEF is preserved with mild valvular heart disease.  And nuclear stress test noted to be low risk study.  She also had a recent left heart catheterization in 2019 which did not note any significant CAD.  Patient was reassured of the recent findings.  In addition, patient states that she has not had reoccurrence of chest pain or shortness of breath.  In regards to palpitations patient states that symptoms have resolved since initiation of verapamil.  Most recent monitor results reviewed with the patient as well and noted below for further  reference.  Patient states that she is working with her endocrinologist in regards to having a repeat fasting lipid profile prior to determining if pharmacological therapy is warranted.  Will defer management to endocrinology at this time.  She does have premature coronary artery disease in the family, Mom had MI and CABG at age of 23.   History of coronary artery spasm. Denies prior history of myocardial infarction, congestive heart failure, deep venous thrombosis, pulmonary embolism, stroke, transient ischemic attack.    ALLERGIES: Allergies  Allergen Reactions  . Seroquel [Quetiapine Fumarate]     Seizure  . Lipitor [Atorvastatin]     Attacks joints, inflamation  . Codeine Rash  . Isosorbide Palpitations    Rapid HR after 1st dose - ?hypotension with rebound tachy / she tolerates prn SL NTG just fine    MEDICATION LIST PRIOR TO VISIT: Current Meds  Medication Sig  . aspirin EC 81 MG tablet Take 81 mg by mouth daily. Swallow whole.  . diltiazem (CARDIZEM CD) 120 MG 24 hr capsule Take 1 capsule by mouth once daily  . insulin NPH Human (NOVOLIN N) 100 UNIT/ML injection Inject 16 Units into the skin 2 (two) times daily before a meal.   . insulin regular (NOVOLIN R) 100 units/mL injection Inject into the skin See admin instructions. 1 unit per 8 grams of carbs.at meals and to correct high blood sugar.  . INSULIN SYRINGE .5CC/28G (INS SYRINGE/NEEDLE .5CC/28G) 28G X 1/2" 0.5 ML MISC Use as instructed to administer insulin  . levothyroxine (SYNTHROID) 50 MCG tablet Take 50 mcg by mouth daily before breakfast.  . metoCLOPramide (REGLAN)  10 MG tablet Take 10 mg by mouth every 6 (six) hours as needed for nausea.  . nitrofurantoin (MACRODANTIN) 100 MG capsule Take 100 mg by mouth daily.  . nitroGLYCERIN (NITROSTAT) 0.4 MG SL tablet Place 1 tablet (0.4 mg total) under the tongue every 5 (five) minutes as needed for chest pain.  Marland Kitchen OLANZapine (ZYPREXA) 5 MG tablet Take 1 tablet (5 mg total) by  mouth at bedtime for 30 days.  . promethazine (PHENERGAN) 25 MG tablet Take 1 tablet (25 mg total) by mouth every 6 (six) hours as needed for nausea or vomiting.     PAST MEDICAL HISTORY: Past Medical History:  Diagnosis Date  . Bipolar 1 disorder ([place])   . Chronic kidney infection   . Coronary artery spasm ([place])   . Diabetes mellitus type I ([place])   . Diabetic gastroparesis ([place])   . DM (diabetes mellitus) ([place])   . Gastroparesis    Due to diabetes   . Hyperlipidemia   . Hypertension   . Hypothyroidism    Has thyroiditis with subsequent hyperthyroidism so off Synthroid  . Seizure disorder ([place])   . Umbilical hernia     PAST SURGICAL HISTORY: Past Surgical History:  Procedure Laterality Date  . ADENOIDECTOMY    . CARDIAC CATHETERIZATION    . LEFT HEART CATH AND CORONARY ANGIOGRAPHY N/A 02/18/2018   Procedure: LEFT HEART CATH AND CORONARY ANGIOGRAPHY;  Surgeon: Jettie Booze, MD;  Location: [place] [facility];  Service: Cardiovascular;  Laterality: N/A;  . TONSILLECTOMY    . VAGINAL HYSTERECTOMY     left ovary removed , has her right ovary    FAMILY HISTORY: The patient family history includes Anxiety disorder in her maternal grandmother, mother, and paternal aunt; Depression in her maternal grandmother, mother, and paternal aunt; Diabetes in her maternal grandmother, mother, paternal aunt, paternal aunt, and paternal grandmother; Goiter in her maternal grandmother; Heart attack in her mother; Mood Disorder in her father; Ovarian cancer in her paternal grandmother; Pulmonary embolism in her daughter; Thyroid disease in her father.  SOCIAL HISTORY:  The patient  reports that she quit smoking about 2 years ago. Her smoking use included cigarettes. She has a 13.00 pack-year smoking history. She has never used smokeless tobacco. She reports previous alcohol use. She reports current drug use. Drug: Marijuana.  REVIEW OF SYSTEMS: Review of Systems  Constitutional: Negative  for chills and fever.  HENT: Negative for hoarse voice and nosebleeds.   Eyes: Negative for discharge, double vision and pain.  Cardiovascular: Negative for chest pain, claudication, dyspnea on exertion, leg swelling, near-syncope, orthopnea, paroxysmal nocturnal dyspnea and syncope.  Respiratory: Negative for hemoptysis and shortness of breath.   Musculoskeletal: Negative for muscle cramps and myalgias.  Gastrointestinal: Negative for abdominal pain, constipation, diarrhea, hematemesis, hematochezia, melena, nausea and vomiting.  Neurological: Negative for dizziness and light-headedness.    PHYSICAL EXAM: Vitals with BMI 04/05/2020 02/27/2020 02/27/2020  Height 5\' 1"  - -  Weight 105 lbs 13 oz - -  BMI 20 - -  Systolic 448 185 631  Diastolic 75 76 86  Pulse 76 81 93  Some encounter information is confidential and restricted. Go to Review Flowsheets activity to see all data.   Orthostatic VS for the past 72 hrs (Last 3 readings):  Patient Position BP Location Cuff Size  04/05/20 1050 Sitting Left Arm Normal   CONSTITUTIONAL: Well-developed and well-nourished. No acute distress.  SKIN: Skin is warm and dry. No rash noted. No cyanosis. No  pallor. No jaundice HEAD: Normocephalic and atraumatic.  EYES: No scleral icterus MOUTH/THROAT: Moist oral membranes.  NECK: No JVD present. No thyromegaly noted. No carotid bruits  LYMPHATIC: No visible cervical adenopathy.  CHEST Normal respiratory effort. No intercostal retractions  LUNGS: Clear to auscultation bilaterally.  No stridor. No wheezes. No rales.  CARDIOVASCULAR: Regular rate and rhythm, positive S1-S2, no murmurs rubs or gallops appreciated. ABDOMINAL: No apparent ascites.  EXTREMITIES: No peripheral edema  HEMATOLOGIC: No significant bruising NEUROLOGIC: Oriented to person, place, and time. Nonfocal. Normal muscle tone.  PSYCHIATRIC: Normal mood and affect. Normal behavior. Cooperative  CARDIAC DATABASE: EKG: 02/21/2020:Sinus   Rhythm, 64bpm, Poor R-wave progression, consider old anterior infarct.  Echocardiogram: 03/07/2020:  Normal LV systolic function with EF 55-60%. Left ventricle cavity is normal in size. Normal global wall motion. Normal diastolic filling pattern, normal LAP. Calculated EF 58%.  Mild (Grade I) aortic regurgitation.  Mild (Grade I) mitral regurgitation.  Mild tricuspid regurgitation. No evidence of pulmonary hypertension.  IVC is dilated with a respiratory response of >50%.  No significant change compared to prior study dated 02/18/2018.  Stress Testing: Exercise Sestamibi Stress Test 03/25/2020: Normal ECG stress. Resting EKG demonstrated normal sinus rhythm. Peak EKG revealed no ST-T wave abnormalities. The calculated Duke Treadmill Score is 11.50. Normal BP response.  The patient exercised for 11 minutes and 30 seconds of a Bruce protocol, achieving approximately 12.71 METs.   Myocardial perfusion is normal. Overall LV systolic function is normal without regional wall motion abnormalities. Stress LV EF: 58%.  No previous exam available for comparison. Low risk.   Heart Catheterization: 02/18/2018:  Prox RCA lesion is 10% stenosed. This may have been catheter induced spasm.  The left ventricular systolic function is normal.  LV end diastolic pressure is normal.  The left ventricular ejection fraction is 55-65% by visual estimate.  There is no aortic valve stenosis.   Continue preventive therapy.  Smoking cessation.  If sx persist, consider medical therapy for vasospasm.   14 - day Mobile Cardiac Ambulatory Telemetry. Enrollment Period: 02/21/2020-03/05/2020 (diagnostic yield 24%) Predominant rhythm sinus bradycardia with HR ranging from 36 - 116 bpm, average HR 59 bpm. Minimum HR 36 bpm at 5:08 AM on 02/23/2020. Maximum HR 116 bpm at 5:14 PM on 02/21/2020. No pauses greater than or equal to 3 seconds. No atrial fibrillation detected during the monitoring period. Total  supraventricular ectopic burden <1%. Total ventricular ectopic burden <1%. HR < 60 bpm for 56% of the recording. HR > 100 bpm for 1% of the recording. 1 Auto triggered event which noted normal sinus rhythm with baseline artifact. 5 Patient triggered events reviewed. During 2 of the activated events patient reported symptoms of chest pain the underlying rhythm was normal sinus. During another episode she had shortness of breath and lightheadedness the underlying rhythm was normal sinus with a rate of 78 bpm. During the patient activated events no dysrhythmias.  LABORATORY DATA: CBC Latest Ref Rng & Units 05/04/2019 05/03/2019 05/03/2019  WBC 4.0 - 10.5 K/uL 6.5 5.9 7.5  Hemoglobin 12.0 - 15.0 g/dL 12.9 12.2 13.9  Hematocrit 36 - 46 % 39.9 38.0 41.7  Platelets 150 - 400 K/uL 321 285 326    CMP Latest Ref Rng & Units 05/04/2019 05/03/2019 05/03/2019  Glucose 70 - 99 mg/dL 229(H) - 327(H)  BUN 6 - 20 mg/dL 6 - 10  Creatinine 0.44 - 1.00 mg/dL 0.82 0.80 1.04(H)  Sodium 135 - 145 mmol/L 133(L) - 131(L)  Potassium 3.5 -  5.1 mmol/L 4.1 - 4.6  Chloride 98 - 111 mmol/L 101 - 98  CO2 22 - 32 mmol/L 21(L) - 23  Calcium 8.9 - 10.3 mg/dL 8.8(L) - 9.2  Total Protein 6.5 - 8.1 g/dL 6.3(L) - 7.3  Total Bilirubin 0.3 - 1.2 mg/dL 0.8 - 0.9  Alkaline Phos 38 - 126 U/L 76 - 88  AST 15 - 41 U/L 20 - 23  ALT 0 - 44 U/L 21 - 23    Lipid Panel     Component Value Date/Time   CHOL 129 02/18/2018 0310   TRIG 140 02/18/2018 0310   HDL 38 (L) 02/18/2018 0310   CHOLHDL 3.4 02/18/2018 0310   VLDL 28 02/18/2018 0310   LDLCALC 63 02/18/2018 0310   HEMOGLOBIN A1C Lab Results  Component Value Date   HGBA1C 7.8 (H) 05/03/2019   MPG 177.16 05/03/2019    IMPRESSION:    ICD-10-CM   1. Precordial chest pain  R07.2   2. Family history of premature CAD  Z82.49   3. Coronary artery vasospasm (HCC)  I20.1   4. Palpitations  R00.2   5. Type 1 diabetes mellitus with complication (HCC)  I62.7   6. Long-term insulin  use (HCC)  Z79.4   7. Syncope and collapse  R55   8. Encounter to discuss test results  Z71.2   9. Former smoker  Z87.891      RECOMMENDATIONS: Karen Hayden is a 44 y.o. female whose past medical history and cardiac risk factors include: Type 1 diabetes mellitus, insulin-dependent, history of coronary vasospasm, hypothyroidism, former smoker, current marijuana use, family history of premature coronary artery disease.  Patient symptoms of chest pain and dyspnea on exertion have resolved since last office visit.  No use of sublingual nitroglycerin tablets.  Since last visit patient did undergo an echocardiogram and exercise nuclear stress test.  Patient had excellent functional capacity for age based on the perfusion study the stress test was reported to be low risk.  Echocardiogram notes preserved left ventricular systolic function.  Patient is reassured that her symptoms are less likely to be cardiac in origin.  However if her symptoms increase in intensity, frequency, and/or duration or has typical chest discomfort may consider additional cardiovascular testing.  However at this time would continue current medical management and surveillance.  Patient is educated on importance of improving her modifiable cardiovascular risk factors.  Given her history of syncope she was recommended to have a 14-day mobile cardiac ambulatory telemetry.  Results of which were discussed with her in great detail at today's office visit.  Overall results were favorable.  Patient has tolerated the initiation of diltiazem well without any side effects or intolerances.    Family history of premature coronary artery disease: Currently on aspirin 81 mg p.o. daily.  Patient states that she will he having a fasting lipid profile with her endocrinology and if warranted will initiate statin therapy.  Will defer further management to endocrinology for now.  Former smoker: Educated on the importance of continued smoking  cessation.  FINAL MEDICATION LIST END OF ENCOUNTER: No orders of the defined types were placed in this encounter.     Current Outpatient Medications:  .  aspirin EC 81 MG tablet, Take 81 mg by mouth daily. Swallow whole., Disp: , Rfl:  .  diltiazem (CARDIZEM CD) 120 MG 24 hr capsule, Take 1 capsule by mouth once daily, Disp: 30 capsule, Rfl: 0 .  insulin NPH Human (NOVOLIN N) 100 UNIT/ML injection,  Inject 16 Units into the skin 2 (two) times daily before a meal. , Disp: , Rfl:  .  insulin regular (NOVOLIN R) 100 units/mL injection, Inject into the skin See admin instructions. 1 unit per 8 grams of carbs.at meals and to correct high blood sugar., Disp: , Rfl:  .  INSULIN SYRINGE .5CC/28G (INS SYRINGE/NEEDLE .5CC/28G) 28G X 1/2" 0.5 ML MISC, Use as instructed to administer insulin, Disp: 60 each, Rfl: 1 .  levothyroxine (SYNTHROID) 50 MCG tablet, Take 50 mcg by mouth daily before breakfast., Disp: , Rfl:  .  metoCLOPramide (REGLAN) 10 MG tablet, Take 10 mg by mouth every 6 (six) hours as needed for nausea., Disp: , Rfl:  .  nitrofurantoin (MACRODANTIN) 100 MG capsule, Take 100 mg by mouth daily., Disp: , Rfl:  .  nitroGLYCERIN (NITROSTAT) 0.4 MG SL tablet, Place 1 tablet (0.4 mg total) under the tongue every 5 (five) minutes as needed for chest pain., Disp: 30 tablet, Rfl: 3 .  OLANZapine (ZYPREXA) 5 MG tablet, Take 1 tablet (5 mg total) by mouth at bedtime for 30 days., Disp: 30 tablet, Rfl: 2 .  promethazine (PHENERGAN) 25 MG tablet, Take 1 tablet (25 mg total) by mouth every 6 (six) hours as needed for nausea or vomiting., Disp: 20 tablet, Rfl: 0  No orders of the defined types were placed in this encounter.   There are no Patient Instructions on file for this visit.   --Continue cardiac medications as reconciled in final medication list. --Return in about 6 months (around 10/06/2020) for follow up for palpitations and chest pain. . Or sooner if needed. --Continue follow-up with your primary  care physician regarding the management of your other chronic comorbid conditions.  Patient's questions and concerns were addressed to her satisfaction. She voices understanding of the instructions provided during this encounter.   Total time spent: 30 minutes  This note was created using a voice recognition software as a result there may be grammatical errors inadvertently enclosed that do not reflect the nature of this encounter. Every attempt is made to correct such errors.  Rex Kras, [place], [facility]  Pager: [phone] Office: [phone]

## 2020-04-19 DIAGNOSIS — K3184 Gastroparesis: Secondary | ICD-10-CM | POA: Diagnosis not present

## 2020-04-19 DIAGNOSIS — E109 Type 1 diabetes mellitus without complications: Secondary | ICD-10-CM | POA: Diagnosis not present

## 2020-04-19 DIAGNOSIS — E7800 Pure hypercholesterolemia, unspecified: Secondary | ICD-10-CM | POA: Diagnosis not present

## 2020-04-19 DIAGNOSIS — I10 Essential (primary) hypertension: Secondary | ICD-10-CM | POA: Diagnosis not present

## 2020-04-19 DIAGNOSIS — F329 Major depressive disorder, single episode, unspecified: Secondary | ICD-10-CM | POA: Diagnosis not present

## 2020-04-19 DIAGNOSIS — I201 Angina pectoris with documented spasm: Secondary | ICD-10-CM | POA: Diagnosis not present

## 2020-04-19 DIAGNOSIS — L709 Acne, unspecified: Secondary | ICD-10-CM | POA: Diagnosis not present

## 2020-04-19 DIAGNOSIS — R300 Dysuria: Secondary | ICD-10-CM | POA: Diagnosis not present

## 2020-04-19 DIAGNOSIS — G609 Hereditary and idiopathic neuropathy, unspecified: Secondary | ICD-10-CM | POA: Diagnosis not present

## 2020-04-19 DIAGNOSIS — E039 Hypothyroidism, unspecified: Secondary | ICD-10-CM | POA: Diagnosis not present

## 2020-05-02 ENCOUNTER — Other Ambulatory Visit: Payer: Self-pay | Admitting: Cardiology

## 2020-05-02 DIAGNOSIS — R002 Palpitations: Secondary | ICD-10-CM

## 2020-05-02 DIAGNOSIS — I201 Angina pectoris with documented spasm: Secondary | ICD-10-CM

## 2020-05-22 DIAGNOSIS — Z23 Encounter for immunization: Secondary | ICD-10-CM | POA: Diagnosis not present

## 2020-08-05 DIAGNOSIS — E039 Hypothyroidism, unspecified: Secondary | ICD-10-CM | POA: Diagnosis not present

## 2020-10-07 ENCOUNTER — Ambulatory Visit: Payer: Medicare Other | Admitting: Cardiology

## 2020-10-17 DIAGNOSIS — E039 Hypothyroidism, unspecified: Secondary | ICD-10-CM | POA: Diagnosis not present

## 2020-10-17 DIAGNOSIS — E7800 Pure hypercholesterolemia, unspecified: Secondary | ICD-10-CM | POA: Diagnosis not present

## 2020-10-17 DIAGNOSIS — E109 Type 1 diabetes mellitus without complications: Secondary | ICD-10-CM | POA: Diagnosis not present

## 2020-10-23 DIAGNOSIS — E039 Hypothyroidism, unspecified: Secondary | ICD-10-CM | POA: Diagnosis not present

## 2020-10-23 DIAGNOSIS — G609 Hereditary and idiopathic neuropathy, unspecified: Secondary | ICD-10-CM | POA: Diagnosis not present

## 2020-10-23 DIAGNOSIS — F329 Major depressive disorder, single episode, unspecified: Secondary | ICD-10-CM | POA: Diagnosis not present

## 2020-10-23 DIAGNOSIS — E109 Type 1 diabetes mellitus without complications: Secondary | ICD-10-CM | POA: Diagnosis not present

## 2020-10-23 DIAGNOSIS — I201 Angina pectoris with documented spasm: Secondary | ICD-10-CM | POA: Diagnosis not present

## 2020-10-23 DIAGNOSIS — L709 Acne, unspecified: Secondary | ICD-10-CM | POA: Diagnosis not present

## 2020-10-23 DIAGNOSIS — M2550 Pain in unspecified joint: Secondary | ICD-10-CM | POA: Diagnosis not present

## 2020-10-23 DIAGNOSIS — K3184 Gastroparesis: Secondary | ICD-10-CM | POA: Diagnosis not present

## 2020-10-23 DIAGNOSIS — N390 Urinary tract infection, site not specified: Secondary | ICD-10-CM | POA: Diagnosis not present

## 2020-10-23 DIAGNOSIS — I10 Essential (primary) hypertension: Secondary | ICD-10-CM | POA: Diagnosis not present

## 2020-10-23 DIAGNOSIS — E7800 Pure hypercholesterolemia, unspecified: Secondary | ICD-10-CM | POA: Diagnosis not present

## 2021-01-01 ENCOUNTER — Encounter (HOSPITAL_BASED_OUTPATIENT_CLINIC_OR_DEPARTMENT_OTHER): Payer: Self-pay

## 2021-01-01 ENCOUNTER — Emergency Department (HOSPITAL_BASED_OUTPATIENT_CLINIC_OR_DEPARTMENT_OTHER)
Admission: EM | Admit: 2021-01-01 | Discharge: 2021-01-01 | Disposition: A | Discharge status: Home / Self Care | Payer: Medicare Other | Attending: Emergency Medicine | Admitting: Emergency Medicine

## 2021-01-01 ENCOUNTER — Other Ambulatory Visit: Payer: Self-pay

## 2021-01-01 ENCOUNTER — Emergency Department (HOSPITAL_BASED_OUTPATIENT_CLINIC_OR_DEPARTMENT_OTHER): Payer: Medicare Other

## 2021-01-01 DIAGNOSIS — E039 Hypothyroidism, unspecified: Secondary | ICD-10-CM | POA: Diagnosis not present

## 2021-01-01 DIAGNOSIS — Z79899 Other long term (current) drug therapy: Secondary | ICD-10-CM | POA: Diagnosis not present

## 2021-01-01 DIAGNOSIS — Z794 Long term (current) use of insulin: Secondary | ICD-10-CM | POA: Insufficient documentation

## 2021-01-01 DIAGNOSIS — I10 Essential (primary) hypertension: Secondary | ICD-10-CM | POA: Insufficient documentation

## 2021-01-01 DIAGNOSIS — R102 Pelvic and perineal pain unspecified side: Secondary | ICD-10-CM

## 2021-01-01 DIAGNOSIS — Z87891 Personal history of nicotine dependence: Secondary | ICD-10-CM | POA: Diagnosis not present

## 2021-01-01 DIAGNOSIS — E109 Type 1 diabetes mellitus without complications: Secondary | ICD-10-CM | POA: Diagnosis not present

## 2021-01-01 DIAGNOSIS — E1065 Type 1 diabetes mellitus with hyperglycemia: Secondary | ICD-10-CM | POA: Diagnosis not present

## 2021-01-01 DIAGNOSIS — Z7982 Long term (current) use of aspirin: Secondary | ICD-10-CM | POA: Insufficient documentation

## 2021-01-01 DIAGNOSIS — R109 Unspecified abdominal pain: Secondary | ICD-10-CM | POA: Diagnosis not present

## 2021-01-01 LAB — COMPREHENSIVE METABOLIC PANEL
ALT: 17 U/L (ref 0–44)
AST: 20 U/L (ref 15–41)
Albumin: 4.1 g/dL (ref 3.5–5.0)
Alkaline Phosphatase: 60 U/L (ref 38–126)
Anion gap: 9 (ref 5–15)
BUN: 14 mg/dL (ref 6–20)
CO2: 25 mmol/L (ref 22–32)
Calcium: 9.4 mg/dL (ref 8.9–10.3)
Chloride: 101 mmol/L (ref 98–111)
Creatinine, Ser: 1.01 mg/dL — ABNORMAL HIGH (ref 0.44–1.00)
GFR, Estimated: >60 mL/min (ref >60)
Glucose, Bld: 337 mg/dL — ABNORMAL HIGH (ref 70–99)
Potassium: 4.2 mmol/L (ref 3.5–5.1)
Sodium: 135 mmol/L (ref 135–145)
Total Bilirubin: 0.4 mg/dL (ref 0.3–1.2)
Total Protein: 6.5 g/dL (ref 6.5–8.1)

## 2021-01-01 LAB — URINALYSIS, ROUTINE W REFLEX MICROSCOPIC
Bilirubin Urine: NEGATIVE
Glucose, UA: >1000 mg/dL — AB
Hgb urine dipstick: NEGATIVE
Ketones, ur: NEGATIVE mg/dL
Leukocytes,Ua: NEGATIVE
Nitrite: NEGATIVE
Protein, ur: NEGATIVE mg/dL
Specific Gravity, Urine: 1.034 — ABNORMAL HIGH (ref 1.005–1.030)
pH: 6 (ref 5.0–8.0)

## 2021-01-01 LAB — I-STAT VENOUS BLOOD GAS, ED
Acid-Base Excess: 3 mmol/L — ABNORMAL HIGH (ref 0.0–2.0)
Bicarbonate: 29.8 mmol/L — ABNORMAL HIGH (ref 20.0–28.0)
Calcium, Ion: 1.29 mmol/L (ref 1.15–1.40)
HCT: 37 % (ref 36.0–46.0)
Hemoglobin: 12.6 g/dL (ref 12.0–15.0)
O2 Saturation: 84 %
Patient temperature: 98.3
Potassium: 3.9 mmol/L (ref 3.5–5.1)
Sodium: 139 mmol/L (ref 135–145)
TCO2: 31 mmol/L (ref 22–32)
pCO2, Ven: 52 mmHg (ref 44.0–60.0)
pH, Ven: 7.366 (ref 7.250–7.430)
pO2, Ven: 51 mmHg — ABNORMAL HIGH (ref 32.0–45.0)

## 2021-01-01 LAB — LIPASE, BLOOD: Lipase: 25 U/L (ref 11–51)

## 2021-01-01 LAB — CBC
HCT: 38.3 % (ref 36.0–46.0)
Hemoglobin: 12.6 g/dL (ref 12.0–15.0)
MCH: 27.7 pg (ref 26.0–34.0)
MCHC: 32.9 g/dL (ref 30.0–36.0)
MCV: 84.2 fL (ref 80.0–100.0)
Platelets: 320 10*3/uL (ref 150–400)
RBC: 4.55 MIL/uL (ref 3.87–5.11)
RDW: 13.2 % (ref 11.5–15.5)
WBC: 7.8 10*3/uL (ref 4.0–10.5)
nRBC: 0 % (ref 0.0–0.2)

## 2021-01-01 LAB — CBG MONITORING, ED
Glucose-Capillary: 47 mg/dL — ABNORMAL LOW (ref 70–99)
Glucose-Capillary: 76 mg/dL (ref 70–99)

## 2021-01-01 MED ORDER — TRAMADOL HCL 50 MG PO TABS: ORAL_TABLET | ORAL | 0 refills | Status: DC

## 2021-01-01 MED ORDER — IBUPROFEN 600 MG PO TABS: 600.0000 mg | ORAL_TABLET | Freq: Four times a day (QID) | ORAL | 0 refills | Status: DC | PRN

## 2021-01-01 MED ORDER — IOHEXOL 300 MG/ML  SOLN
75.0000 mL | Freq: Once | INTRAMUSCULAR | Status: AC | PRN
  Administered 2021-01-01: 75 mL via INTRAVENOUS

## 2021-01-01 MED ORDER — IOHEXOL 9 MG/ML PO SOLN
500.0000 mL | ORAL | Status: AC
  Administered 2021-01-01 (×2): 500 mL via ORAL

## 2021-01-01 MED ORDER — ONDANSETRON 4 MG PO TBDP: 4.0000 mg | ORAL_TABLET | ORAL | 0 refills | Status: DC | PRN

## 2021-01-01 MED ORDER — LACTATED RINGERS IV BOLUS
1000.0000 mL | Freq: Once | INTRAVENOUS | Status: AC
  Administered 2021-01-01: 1000 mL via INTRAVENOUS

## 2021-01-01 MED ORDER — HYDROMORPHONE HCL 1 MG/ML IJ SOLN
1.0000 mg | Freq: Once | INTRAMUSCULAR | Status: AC
  Administered 2021-01-01: 1 mg via INTRAVENOUS
  Filled 2021-01-01: qty 1

## 2021-01-01 NOTE — ED Notes (Signed)
Pt states she feels better - went to the bathroom ambulatory - denies dizzy - BGL rechecked  -refer to chart

## 2021-01-01 NOTE — ED Provider Notes (Signed)
[facility] EMERGENCY DEPT Provider Note   CSN: [id number] Arrival date & time: 01/01/21  1757     History Chief Complaint  Patient presents with  . Abdominal Pain    Karen Hayden is a 45 y.o. female.  HPI Patient reports she started developing some abdominal pain yesterday.  She reports it was first somewhat on the right and now today more on the left.  She reports it is very painful today.  She has been getting some really sharp and uncomfortable pains rating down on the left side of her abdomen towards her lower abdomen and groin area.  Reports she is felt kind of nauseated but not had any active vomiting.  No fever.  No diarrhea.  Patient reports her blood sugars have been moderately elevated for a couple of days.  She reports she has had a hysterectomy and only has a remaining right ovary.  She denies she has been having pain or burning with urination.  No abnormal vaginal discharge.  Patient later added that she had experienced pain during sexual intercourse with a partner who had a large genital size relative to herself.  She denies that she is having any vaginal discharge.  She is not having any malodorous discharge.  No bleeding.  She has had a total hysterectomy except for a right ovary by report.    Past Medical History:  Diagnosis Date  . Bipolar 1 disorder ([place])   . Chronic kidney infection   . Coronary artery spasm ([place])   . Diabetes mellitus type I ([place])   . Diabetic gastroparesis ([place])   . DM (diabetes mellitus) ([place])   . Gastroparesis    Due to diabetes   . Hyperlipidemia   . Hypertension   . Hypothyroidism    Has thyroiditis with subsequent hyperthyroidism so off Synthroid  . Seizure disorder ([place])   . Umbilical hernia     Patient Active Problem List   Diagnosis Date Noted  . Nausea & vomiting 05/03/2019  . ARF (acute renal failure) ([place]) 05/03/2019  . Palpitations 07/06/2018  . Acute posttraumatic stress disorder 07/06/2018  . 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 Marijuana dependence ([place]) 02/17/2018  . Diabetic ketoacidosis without coma associated with type 1 diabetes mellitus ([place])   . Protein-calorie malnutrition, severe ([place]) 01/29/2014  . Unspecified constipation 01/26/2014  . Cough 01/26/2014  . Tobacco use disorder 01/26/2014  . Nausea with vomiting 06/21/2013  . Change in bowel habits 06/21/2013  . Diabetic gastroparesis ([place]) 06/04/2011  . Type 1 diabetes mellitus ([place]) 01/04/2009  . Acute bronchitis 01/04/2009  . SEIZURE DISORDER 01/04/2009    Past Surgical History:  Procedure Laterality Date  . ADENOIDECTOMY    . CARDIAC CATHETERIZATION    . LEFT HEART CATH AND CORONARY ANGIOGRAPHY N/A 02/18/2018   Procedure: LEFT HEART CATH AND CORONARY ANGIOGRAPHY;  Surgeon: Jettie Booze, MD;  Location: [facility];  Service: Cardiovascular;  Laterality: N/A;  . TONSILLECTOMY    . VAGINAL HYSTERECTOMY     left ovary removed , has her right ovary     OB History    Gravida  2   Para      Term      Preterm      AB      Living  2     SAB  0   IAB  0   Ectopic      Multiple      Live Births  Family History  Problem Relation Age of Onset  . Diabetes Mother   . Heart attack Mother   . Depression Mother   . Anxiety disorder Mother   . Ovarian cancer Paternal Grandmother        great grandmother  . Diabetes Paternal Grandmother   . Mood Disorder Father   . Thyroid disease Father   . Depression Maternal Grandmother   . Anxiety disorder Maternal Grandmother   . Goiter Maternal Grandmother   . Diabetes Maternal Grandmother   . Depression Paternal Aunt   . Anxiety disorder Paternal Aunt   . Diabetes Paternal Aunt   . Diabetes Paternal Aunt   . Pulmonary embolism Daughter   . Colon cancer Neg Hx     Social History   Tobacco Use  . Smoking status: Former Smoker    Packs/day: 1.00    Years: 13.00    Pack years: 13.00    Types: Cigarettes    Quit  date: 02/17/2018    Years since quitting: 2.8  . Smokeless tobacco: Never Used  . Tobacco comment: Tobacco info given  Vaping Use  . Vaping Use: Every day  Substance Use Topics  . Alcohol use: Not Currently  . Drug use: Yes    Types: Marijuana    Comment: last use last night, uses daily    Home Medications Prior to Admission medications   Medication Sig Start Date End Date Taking? Authorizing Provider  ibuprofen (ADVIL) 600 MG tablet Take 1 tablet (600 mg total) by mouth every 6 (six) hours as needed. 01/01/21  Yes Charlesetta Shanks, MD  traMADol (ULTRAM) 50 MG tablet 1-2 tablets every 6 hours as needed for pain 01/01/21  Yes Mckinzy Fuller, Jeannie Done, MD  aspirin EC 81 MG tablet Take 81 mg by mouth daily. Swallow whole.    [provider]  diltiazem (CARDIZEM CD) 120 MG 24 hr capsule Take 1 capsule by mouth once daily 05/02/20   Tolia, Sunit, DO  insulin NPH Human (NOVOLIN N) 100 UNIT/ML injection Inject 16 Units into the skin 2 (two) times daily before a meal.     [provider]  insulin regular (NOVOLIN R) 100 units/mL injection Inject into the skin See admin instructions. 1 unit per 8 grams of carbs.at meals and to correct high blood sugar.    [provider]  INSULIN SYRINGE .5CC/28G (INS SYRINGE/NEEDLE .5CC/28G) 28G X 1/2" 0.5 ML MISC Use as instructed to administer insulin 05/10/17   Bonnielee Haff, MD  levothyroxine (SYNTHROID) 50 MCG tablet Take 50 mcg by mouth daily before breakfast.    [provider]  metoCLOPramide (REGLAN) 10 MG tablet Take 10 mg by mouth every 6 (six) hours as needed for nausea.    [provider]  nitrofurantoin (MACRODANTIN) 100 MG capsule Take 100 mg by mouth daily. 01/23/20   [provider]  nitroGLYCERIN (NITROSTAT) 0.4 MG SL tablet Place 1 tablet (0.4 mg total) under the tongue every 5 (five) minutes as needed for chest pain. 08/12/18   Dorothy Spark, MD  OLANZapine (ZYPREXA) 5 MG tablet Take 1 tablet (5 mg  total) by mouth at bedtime for 30 days. 11/23/18 04/05/20  Thayer Headings, PMHNP  promethazine (PHENERGAN) 25 MG tablet Take 1 tablet (25 mg total) by mouth every 6 (six) hours as needed for nausea or vomiting. 05/04/19   Mariel Aloe, MD    Allergies    Seroquel [quetiapine fumarate], Lipitor [atorvastatin], Codeine, and Isosorbide  Review of Systems   Review  of Systems 10 systems reviewed and negative except as per HPI Physical Exam Updated Vital Signs BP 130/78   Pulse 61   Temp 98.3 F (36.8 C) (Oral)   Resp 20 Comment: Simultaneous filing. User may not have seen previous data.  Ht 5\' 2"  (1.575 m)   Wt 46.3 kg   SpO2 100%   BMI 18.66 kg/m   Physical Exam Constitutional:      Comments: Alert and nontoxic.  No respiratory distress.  Color is good.  HENT:     Mouth/Throat:     Pharynx: Oropharynx is clear.  Eyes:     Extraocular Movements: Extraocular movements intact.  Cardiovascular:     Rate and Rhythm: Normal rate and regular rhythm.  Pulmonary:     Effort: Pulmonary effort is normal.     Breath sounds: Normal breath sounds.  Abdominal:     Comments: Abdomen soft.  No guarding.  Patient endorses significant pain to palpation of the left lower quadrant.  No palpable mass.  Musculoskeletal:        General: No swelling or tenderness. Normal range of motion.     Right lower leg: No edema.     Left lower leg: No edema.  Skin:    General: Skin is warm and dry.  Neurological:     General: No focal deficit present.     Mental Status: She is oriented to person, place, and time.     Coordination: Coordination normal.  Psychiatric:        Mood and Affect: Mood normal.     ED Results / Procedures / Treatments   Labs (all labs ordered are listed, but only abnormal results are displayed) Labs Reviewed  COMPREHENSIVE METABOLIC PANEL - Abnormal; Notable for the following components:      Result Value   Glucose, Bld 337 (*)    Creatinine, Ser 1.01 (*)    All other  components within normal limits  URINALYSIS, ROUTINE W REFLEX MICROSCOPIC - Abnormal; Notable for the following components:   Color, Urine COLORLESS (*)    Specific Gravity, Urine 1.034 (*)    Glucose, UA >1,000 (*)    All other components within normal limits  CBG MONITORING, ED - Abnormal; Notable for the following components:   Glucose-Capillary 47 (*)    All other components within normal limits  I-STAT VENOUS BLOOD GAS, ED - Abnormal; Notable for the following components:   pO2, Ven 51.0 (*)    Bicarbonate 29.8 (*)    Acid-Base Excess 3.0 (*)    All other components within normal limits  LIPASE, BLOOD  CBC  BLOOD GAS, VENOUS  CBG MONITORING, ED    EKG None  Radiology CT Abdomen Pelvis W Contrast  Result Date: 01/01/2021 CLINICAL DATA:  Onset lower abdominal pain radiating into the back yesterday. EXAM: CT ABDOMEN AND PELVIS WITH CONTRAST TECHNIQUE: Multidetector CT imaging of the abdomen and pelvis was performed using the standard protocol following bolus administration of intravenous contrast. CONTRAST:  75 mL OMNIPAQUE IOHEXOL 300 MG/ML  SOLN COMPARISON:  CT abdomen and pelvis 05/03/2019. FINDINGS: Lower chest: Minimal dependent atelectasis on the left. No pleural or pericardial effusion. Hepatobiliary: No focal liver abnormality is seen. No gallstones, gallbladder wall thickening, or biliary dilatation. Pancreas: Unremarkable. No pancreatic ductal dilatation or surrounding inflammatory changes. Spleen: Normal in size without focal abnormality. Adrenals/Urinary Tract: Adrenal glands are unremarkable. Kidneys are normal, without renal calculi, focal lesion, or hydronephrosis. Bladder is unremarkable. Stomach/Bowel: Stomach is within normal limits.  Appendix appears normal. No evidence of bowel wall thickening, distention, or inflammatory changes. Vascular/Lymphatic: No significant vascular findings are present. No enlarged abdominal or pelvic lymph nodes. Reproductive: Status post  hysterectomy. No adnexal masses. Other: None. Musculoskeletal: No acute or focal abnormality. IMPRESSION: Negative CT abdomen and pelvis. No finding to explain the patient's symptoms. Electronically Signed   By: Inge Rise M.D.   On: 01/01/2021 21:02    Procedures Procedures   Medications Ordered in ED Medications  lactated ringers bolus 1,000 mL (0 mLs Intravenous Stopped 01/01/21 2308)  HYDROmorphone (DILAUDID) injection 1 mg (1 mg Intravenous Given 01/01/21 1916)  iohexol (OMNIPAQUE) 9 MG/ML oral solution 500 mL (500 mLs Oral Contrast Given 01/01/21 2012)  iohexol (OMNIPAQUE) 300 MG/ML solution 75 mL (75 mLs Intravenous Contrast Given 01/01/21 2050)  HYDROmorphone (DILAUDID) injection 1 mg (1 mg Intravenous Given 01/01/21 2126)    ED Course  I have reviewed the triage vital signs and the nursing notes.  Pertinent labs & imaging results that were available during my care of the patient were reviewed by me and considered in my medical decision making (see chart for details).    MDM Rules/Calculators/A&P                         Patient is alert and nontoxic.  Abdominal examinations for lower abdominal pain.  CT does not show any kidney stones or other acute findings.  After completing evaluation and having CT, patient did add to the history that pain seem to start after a episode of intercourse.  With patient having had hysterectomy, very little possibility of STD as etiology.  Patient has not had any drainage or discharge.  No bleeding.  Patient's blood sugars were reported to be high earlier in the day and she had treated herself prior to coming to the emergency department.  After several hours in department she did have a CBG of 47.  She was given juice and crackers and this returned to 76.  No indication of acute complications regarding her diabetes this evening.  Patient is stable for discharge.  We will plan for short-term pain control for pelvic pain over the next 2 days.  I have advised  the patient anticipate this will resolve spontaneously.  Return precautions have been reviewed.  Patient is to follow-up with GYN of her PCP for recheck  Final Clinical Impression(s) / ED Diagnoses Final diagnoses:  Pelvic pain in female  Controlled diabetes mellitus type 1 without complications ([place])    Rx / DC Orders ED Discharge Orders         Ordered    traMADol (ULTRAM) 50 MG tablet        01/01/21 2316    ibuprofen (ADVIL) 600 MG tablet  Every 6 hours PRN        01/01/21 2316    ondansetron (ZOFRAN ODT) 4 MG disintegrating tablet  Every 4 hours PRN,   Status:  Discontinued        01/01/21 2317           Charlesetta Shanks, MD 01/01/21 2322

## 2021-01-01 NOTE — ED Notes (Signed)
Pt is DM type 1. Pt is c/o dizzy and weak  - BGL checked and it was 47 mg /dl -  Informed EDP -  Orange juice was given to help bring her BGL up - pt is alert and oriented at this time

## 2021-01-01 NOTE — ED Notes (Signed)
RT obtained VBG on pt with the following results. MD notified of results.  Ph 7.366 PCO2  52 HCO3  29.8

## 2021-01-01 NOTE — Discharge Instructions (Signed)
1.  Take ibuprofen and tramadol for pain as needed. 2.  I anticipate your pain will start to improve within the next 2 days. 3.  Return to the emergency department if you are developing worsening pain, fevers, vomiting or other concerning symptoms. 4.  Follow-up with your gynecologist and/or family doctor for recheck within the next 2 to 3 days

## 2021-01-01 NOTE — ED Triage Notes (Signed)
Pt c/o lower abd pain that started yesterday with radiation to back; associated abd distension. Pt having normal BMs.

## 2021-04-22 DIAGNOSIS — I10 Essential (primary) hypertension: Secondary | ICD-10-CM | POA: Diagnosis not present

## 2021-04-22 DIAGNOSIS — G609 Hereditary and idiopathic neuropathy, unspecified: Secondary | ICD-10-CM | POA: Diagnosis not present

## 2021-04-22 DIAGNOSIS — E109 Type 1 diabetes mellitus without complications: Secondary | ICD-10-CM | POA: Diagnosis not present

## 2021-04-22 DIAGNOSIS — I201 Angina pectoris with documented spasm: Secondary | ICD-10-CM | POA: Diagnosis not present

## 2021-04-22 DIAGNOSIS — L709 Acne, unspecified: Secondary | ICD-10-CM | POA: Diagnosis not present

## 2021-04-22 DIAGNOSIS — E7800 Pure hypercholesterolemia, unspecified: Secondary | ICD-10-CM | POA: Diagnosis not present

## 2021-04-22 DIAGNOSIS — E039 Hypothyroidism, unspecified: Secondary | ICD-10-CM | POA: Diagnosis not present

## 2021-04-22 DIAGNOSIS — K3184 Gastroparesis: Secondary | ICD-10-CM | POA: Diagnosis not present

## 2021-04-22 DIAGNOSIS — F329 Major depressive disorder, single episode, unspecified: Secondary | ICD-10-CM | POA: Diagnosis not present

## 2021-05-16 DIAGNOSIS — Z23 Encounter for immunization: Secondary | ICD-10-CM | POA: Diagnosis not present

## 2021-06-16 DIAGNOSIS — E109 Type 1 diabetes mellitus without complications: Secondary | ICD-10-CM | POA: Diagnosis not present

## 2021-10-02 ENCOUNTER — Emergency Department (HOSPITAL_BASED_OUTPATIENT_CLINIC_OR_DEPARTMENT_OTHER)
Admission: EM | Admit: 2021-10-02 | Discharge: 2021-10-02 | Disposition: A | Discharge status: Home / Self Care | Payer: Medicare Other | Attending: Emergency Medicine | Admitting: Emergency Medicine

## 2021-10-02 ENCOUNTER — Other Ambulatory Visit: Payer: Self-pay

## 2021-10-02 DIAGNOSIS — R21 Rash and other nonspecific skin eruption: Secondary | ICD-10-CM | POA: Diagnosis not present

## 2021-10-02 DIAGNOSIS — Z7982 Long term (current) use of aspirin: Secondary | ICD-10-CM | POA: Insufficient documentation

## 2021-10-02 DIAGNOSIS — R519 Headache, unspecified: Secondary | ICD-10-CM | POA: Diagnosis not present

## 2021-10-02 DIAGNOSIS — Z79899 Other long term (current) drug therapy: Secondary | ICD-10-CM | POA: Insufficient documentation

## 2021-10-02 DIAGNOSIS — R110 Nausea: Secondary | ICD-10-CM | POA: Insufficient documentation

## 2021-10-02 DIAGNOSIS — Z794 Long term (current) use of insulin: Secondary | ICD-10-CM | POA: Insufficient documentation

## 2021-10-02 DIAGNOSIS — E109 Type 1 diabetes mellitus without complications: Secondary | ICD-10-CM | POA: Diagnosis not present

## 2021-10-02 DIAGNOSIS — I10 Essential (primary) hypertension: Secondary | ICD-10-CM | POA: Insufficient documentation

## 2021-10-02 DIAGNOSIS — B029 Zoster without complications: Secondary | ICD-10-CM | POA: Diagnosis not present

## 2021-10-02 MED ORDER — HYDROCODONE-ACETAMINOPHEN 5-325 MG PO TABS: 2.0000 | ORAL_TABLET | ORAL | 0 refills | Status: DC | PRN

## 2021-10-02 MED ORDER — HYDROCODONE-ACETAMINOPHEN 5-325 MG PO TABS
1.0000 | ORAL_TABLET | Freq: Once | ORAL | Status: AC
  Administered 2021-10-02: 1 via ORAL
  Filled 2021-10-02: qty 1

## 2021-10-02 MED ORDER — VALACYCLOVIR HCL 1 G PO TABS: 1000.0000 mg | ORAL_TABLET | Freq: Three times a day (TID) | ORAL | 0 refills | Status: AC

## 2021-10-02 NOTE — ED Provider Notes (Signed)
[facility] EMERGENC[facility] DEPT Provider Note   CSN: [id number] Arrival date & time: 10/02/21  1235     History  Chief Complaint  Patient presents with   Rash   Herpes Zoster    Suspected Shingles, per pt.      Karen Hayden is a 46 y.o. female. With past medical history of T1DM, diabetic gastroparesis, N/V, HTN who presents to the emergency department with rash.   Patient states that 3 days ago she began feeling a pain sensation in her right back. She states that yesterday she began having rash to the right back with intense pain. She states that rash spread to her abdomen this morning. She describes it as "severely painful, and pain to very light touch." Endorses burning pain. Pain is constant. She has had associated headache, chills, nausea without vomiting. Denies rash to eyes, ears or mouth.  No dysarthria or facial paralysis.   Rash Associated symptoms: headaches and nausea   Associated symptoms: no fever and not vomiting       Home Medications Prior to Admission medications   Medication Sig Start Date End Date Taking? Authorizing Provider  aspirin EC 81 MG tablet Take 81 mg by mouth daily. Swallow whole.    [provider]  diltiazem (CARDIZEM CD) 120 MG 24 hr capsule Take 1 capsule by mouth once daily 05/02/20   Tolia, Sunit, DO  ibuprofen (ADVIL) 600 MG tablet Take 1 tablet (600 mg total) by mouth every 6 (six) hours as needed. 01/01/21   Charlesetta Shanks, MD  insulin NPH Human (NOVOLIN N) 100 UNIT/ML injection Inject 16 Units into the skin 2 (two) times daily before a meal.     [provider]  insulin regular (NOVOLIN R) 100 units/mL injection Inject into the skin See admin instructions. 1 unit per 8 grams of carbs.at meals and to correct high blood sugar.    [provider]  INSULIN SYRINGE .5CC/28G (INS SYRINGE/NEEDLE .5CC/28G) 28G X 1/2" 0.5 ML MISC Use as instructed to administer insulin 05/10/17   Bonnielee Haff, MD  levothyroxine  (SYNTHROID) 50 MCG tablet Take 50 mcg by mouth daily before breakfast.    [provider]  metoCLOPramide (REGLAN) 10 MG tablet Take 10 mg by mouth every 6 (six) hours as needed for nausea.    [provider]  nitrofurantoin (MACRODANTIN) 100 MG capsule Take 100 mg by mouth daily. 01/23/20   [provider]  nitroGLYCERIN (NITROSTAT) 0.4 MG SL tablet Place 1 tablet (0.4 mg total) under the tongue every 5 (five) minutes as needed for chest pain. 08/12/18   Dorothy Spark, MD  OLANZapine (ZYPREXA) 5 MG tablet Take 1 tablet (5 mg total) by mouth at bedtime for 30 days. 11/23/18 04/05/20  Thayer Headings, PMHNP  promethazine (PHENERGAN) 25 MG tablet Take 1 tablet (25 mg total) by mouth every 6 (six) hours as needed for nausea or vomiting. 05/04/19   Mariel Aloe, MD  traMADol Veatrice Bourbon) 50 MG tablet 1-2 tablets every 6 hours as needed for pain 01/01/21   Charlesetta Shanks, MD      Allergies    Seroquel [quetiapine fumarate], Lipitor [atorvastatin], Codeine, and Isosorbide    Review of Systems   Review of Systems  Constitutional:  Positive for chills. Negative for fever.  Gastrointestinal:  Positive for nausea. Negative for vomiting.  Skin:  Positive for rash.  Neurological:  Positive for headaches.  All other systems reviewed and are negative.  Physical Exam Updated Vital Signs BP Marland Kitchen)  157/113 (BP Location: Right Arm)    Pulse 94    Temp 98 F (36.7 C)    Resp (!) 22    Ht 5\' 3"  (1.6 m)    Wt 45.8 kg    SpO2 100%    BMI 17.89 kg/m  Physical Exam Vitals and nursing note reviewed.  Constitutional:      General: She is in acute distress.     Appearance: She is not ill-appearing or toxic-appearing.     Comments: Crying on exam   HENT:     Head: Normocephalic and atraumatic.     Nose: Nose normal.     Mouth/Throat:     Mouth: Mucous membranes are moist.     Pharynx: Oropharynx is clear. No posterior oropharyngeal erythema.  Eyes:     General: No scleral icterus.        Right eye: No discharge.        Left eye: No discharge.     Extraocular Movements: Extraocular movements intact.     Pupils: Pupils are equal, round, and reactive to light.  Cardiovascular:     Pulses: Normal pulses.  Pulmonary:     Effort: Pulmonary effort is normal. No respiratory distress.  Abdominal:     Palpations: Abdomen is soft.  Musculoskeletal:        General: Normal range of motion.     Cervical back: Normal range of motion and neck supple.  Skin:    General: Skin is warm and dry.     Findings: Rash present. Rash is vesicular.     Comments: Vesicular rash to the right back in dermatome which expands to the right side and abdomen   Neurological:     General: No focal deficit present.     Mental Status: She is alert and oriented to person, place, and time. Mental status is at baseline.  Psychiatric:        Mood and Affect: Mood normal.        Behavior: Behavior normal.        Thought Content: Thought content normal.        Judgment: Judgment normal.    ED Results / Procedures / Treatments   Labs (all labs ordered are listed, but only abnormal results are displayed) Labs Reviewed - No data to display  EKG None  Radiology No results found.  Procedures Procedures   Medications Ordered in ED Medications  HYDROcodone-acetaminophen (NORCO/VICODIN) 5-325 MG per tablet 1 tablet (1 tablet Oral Given 10/02/21 1358)    ED Course/ Medical Decision Making/ A&P                           Medical Decision Making Risk Prescription drug management.  Patient presents to the ED with complaints of rash. This involves an extensive number of treatment options, and is a complaint that carries with it a low to moderate risk of complications and morbidity.   Additional history obtained:  Additional history obtained from: None External records from outside source obtained and reviewed including: Previous ED visits  Medications  I ordered medication including Norco for  pain Reevaluation of the patient after medication shows that patient improved  Tests Considered: HSV  ED Course: 46 year old female who presents to the emergency department with right-sided rash.  She is quite uncomfortable on exam due to rash.  Rash is consistent with herpes zoster/shingles.  History and exam findings and not consistent with dangerous etiologies of rash  such as SJS/TN, or secondary dangerous causes such as petechial rashes from thrombocytopenia or rickettsial infection.  Rash does not appear urticarial with no signs of anaphylaxis.  Additionally rash does not cross midline. She has no involvement of the ears concerning for Ramsay Hunt syndrome.  No involvement of the eyes with concern for herpes zoster ophthalmicus.  No evidence of disseminated herpes.  She is not immunocompromised.  No concern for pneumonitis, hepatitis, encephalitis.  Patient given Norco here in the emergency department for pain relief as she is in rather significant pain.  Discussed course of treatment including valacyclovir over the next 7 days.  We will provide prescription for Norco for severe pain.  Instructed to use Motrin initially and then escalate to Norco as needed.  Discussed calling the emergency department if valacyclovir is too expensive and can provide acyclovir however would prefer valacyclovir course.  Discussed return precautions for worsening rash, involvement of the eyes or ears, confusion, shortness of breath, worsening abdominal pain, fevers.  She verbalized understanding.  Discussed prevention and contagious etiology of rash and to avoid contact with others until lesions are crusted.  After consideration of the diagnostic results and the patients response to treatment, I feel that the patent would benefit from discharge. The patient has been appropriately medically screened and/or stabilized in the ED. I have low suspicion for any other emergent medical condition which would require further  screening, evaluation or treatment in the ED or require inpatient management. The patient is overall well appearing although in pain, and non-toxic in appearance. They are hemodynamically stable at time of discharge.   Final Clinical Impression(s) / ED Diagnoses Final diagnoses:  Herpes zoster without complication    Rx / DC Orders ED Discharge Orders          Ordered    valACYclovir (VALTREX) 1000 MG tablet  3 times daily        10/02/21 1420    HYDROcodone-acetaminophen (NORCO/VICODIN) 5-325 MG tablet  Every 4 hours PRN        10/02/21 1420              Mickie Hillier, PA-C 24/09/73 5329    Lianne Cure, DO 92/42/68 2237

## 2021-10-02 NOTE — Discharge Instructions (Addendum)
You were seen in the emergency department today for rash.  You have shingles.  We are prescribing you an antiviral medication called valacyclovir that you will take 3 times a day for the next 7 days.  Additionally I prescribed you pain medication.  Please try ibuprofen prior to taking the Norco.  You have been prescribed a medication that is considered an opiate. Opiates are pain medications that should be used with caution. It is important that you do not drive while taking this medication as it can cause drowsiness and impaired reaction times. Do not mix this medication with benzodiazepine medications or alcohol as this can cause respiratory depression. Additionally, opiates have addicting properties to them. Please use medication as prescribed by your provider.  Please return to the emergency department if you begin to have rash involvement of your eyes, ears or mouth, high fevers, stiffness to your neck or confusion.  Otherwise please follow-up with your primary care provider for symptom improvement in 1 week.

## 2021-10-02 NOTE — ED Triage Notes (Addendum)
Patient arrives with complaints of painful rash. Patient reports that she the pain started 2 days ago and she woke up with the rash on her back yesterday. Rash spread to her stomach today. Patient crying/tearful in triage.   Reports also headaches, chills, and nausea.  Hx of chicken pox at age 46.

## 2022-04-26 ENCOUNTER — Emergency Department (HOSPITAL_BASED_OUTPATIENT_CLINIC_OR_DEPARTMENT_OTHER): Payer: Medicare Other | Admitting: Radiology

## 2022-04-26 ENCOUNTER — Encounter (HOSPITAL_BASED_OUTPATIENT_CLINIC_OR_DEPARTMENT_OTHER): Payer: Self-pay | Admitting: Emergency Medicine

## 2022-04-26 ENCOUNTER — Emergency Department (HOSPITAL_BASED_OUTPATIENT_CLINIC_OR_DEPARTMENT_OTHER)
Admission: EM | Admit: 2022-04-26 | Discharge: 2022-04-27 | Disposition: A | Discharge status: Home / Self Care | Payer: Medicare Other | Attending: Emergency Medicine | Admitting: Emergency Medicine

## 2022-04-26 ENCOUNTER — Other Ambulatory Visit: Payer: Self-pay

## 2022-04-26 ENCOUNTER — Emergency Department (HOSPITAL_BASED_OUTPATIENT_CLINIC_OR_DEPARTMENT_OTHER): Payer: Medicare Other

## 2022-04-26 DIAGNOSIS — E109 Type 1 diabetes mellitus without complications: Secondary | ICD-10-CM | POA: Diagnosis not present

## 2022-04-26 DIAGNOSIS — R590 Localized enlarged lymph nodes: Secondary | ICD-10-CM | POA: Diagnosis not present

## 2022-04-26 DIAGNOSIS — Z20822 Contact with and (suspected) exposure to covid-19: Secondary | ICD-10-CM | POA: Diagnosis not present

## 2022-04-26 DIAGNOSIS — R5383 Other fatigue: Secondary | ICD-10-CM | POA: Insufficient documentation

## 2022-04-26 DIAGNOSIS — R001 Bradycardia, unspecified: Secondary | ICD-10-CM | POA: Diagnosis not present

## 2022-04-26 DIAGNOSIS — R634 Abnormal weight loss: Secondary | ICD-10-CM | POA: Diagnosis not present

## 2022-04-26 DIAGNOSIS — R519 Headache, unspecified: Secondary | ICD-10-CM | POA: Diagnosis not present

## 2022-04-26 DIAGNOSIS — R531 Weakness: Secondary | ICD-10-CM | POA: Diagnosis not present

## 2022-04-26 LAB — CBC
HCT: 43.9 % (ref 36.0–46.0)
Hemoglobin: 14.5 g/dL (ref 12.0–15.0)
MCH: 27.4 pg (ref 26.0–34.0)
MCHC: 33 g/dL (ref 30.0–36.0)
MCV: 83 fL (ref 80.0–100.0)
Platelets: 349 10*3/uL (ref 150–400)
RBC: 5.29 MIL/uL — ABNORMAL HIGH (ref 3.87–5.11)
RDW: 12.8 % (ref 11.5–15.5)
WBC: 6 10*3/uL (ref 4.0–10.5)
nRBC: 0 % (ref 0.0–0.2)

## 2022-04-26 LAB — HEPATIC FUNCTION PANEL
ALT: 16 U/L (ref 0–44)
AST: 24 U/L (ref 15–41)
Albumin: 4.3 g/dL (ref 3.5–5.0)
Alkaline Phosphatase: 60 U/L (ref 38–126)
Bilirubin, Direct: 0.1 mg/dL (ref 0.0–0.2)
Indirect Bilirubin: 0.6 mg/dL (ref 0.3–0.9)
Total Bilirubin: 0.7 mg/dL (ref 0.3–1.2)
Total Protein: 7.3 g/dL (ref 6.5–8.1)

## 2022-04-26 LAB — URINALYSIS, ROUTINE W REFLEX MICROSCOPIC
Bilirubin Urine: NEGATIVE
Glucose, UA: >1000 mg/dL — AB
Hgb urine dipstick: NEGATIVE
Ketones, ur: NEGATIVE mg/dL
Leukocytes,Ua: NEGATIVE
Nitrite: NEGATIVE
Protein, ur: NEGATIVE mg/dL
Specific Gravity, Urine: 1.007 (ref 1.005–1.030)
pH: 6.5 (ref 5.0–8.0)

## 2022-04-26 LAB — BASIC METABOLIC PANEL
Anion gap: 9 (ref 5–15)
BUN: 14 mg/dL (ref 6–20)
CO2: 28 mmol/L (ref 22–32)
Calcium: 10.1 mg/dL (ref 8.9–10.3)
Chloride: 98 mmol/L (ref 98–111)
Creatinine, Ser: 0.91 mg/dL (ref 0.44–1.00)
GFR, Estimated: >60 mL/min (ref >60)
Glucose, Bld: 420 mg/dL — ABNORMAL HIGH (ref 70–99)
Potassium: 4 mmol/L (ref 3.5–5.1)
Sodium: 135 mmol/L (ref 135–145)

## 2022-04-26 LAB — SARS CORONAVIRUS 2 BY RT PCR: SARS Coronavirus 2 by RT PCR: NEGATIVE

## 2022-04-26 LAB — CBG MONITORING, ED: Glucose-Capillary: 397 mg/dL — ABNORMAL HIGH (ref 70–99)

## 2022-04-26 MED ORDER — IBUPROFEN 600 MG PO TABS: 600.0000 mg | ORAL_TABLET | Freq: Four times a day (QID) | ORAL | 0 refills | Status: AC | PRN

## 2022-04-26 MED ORDER — PROCHLORPERAZINE MALEATE 10 MG PO TABS: 10.0000 mg | ORAL_TABLET | Freq: Two times a day (BID) | ORAL | 0 refills | Status: DC | PRN

## 2022-04-26 MED ORDER — DIPHENHYDRAMINE HCL 50 MG/ML IJ SOLN
25.0000 mg | Freq: Once | INTRAMUSCULAR | Status: AC
  Administered 2022-04-26: 25 mg via INTRAVENOUS
  Filled 2022-04-26: qty 1

## 2022-04-26 MED ORDER — PROCHLORPERAZINE EDISYLATE 10 MG/2ML IJ SOLN
10.0000 mg | Freq: Once | INTRAMUSCULAR | Status: AC
  Administered 2022-04-26: 10 mg via INTRAVENOUS
  Filled 2022-04-26: qty 2

## 2022-04-26 MED ORDER — PREDNISONE 10 MG PO TABS: 40.0000 mg | ORAL_TABLET | Freq: Every day | ORAL | 0 refills | Status: AC

## 2022-04-26 MED ORDER — KETOROLAC TROMETHAMINE 30 MG/ML IJ SOLN
30.0000 mg | Freq: Once | INTRAMUSCULAR | Status: AC
  Administered 2022-04-26: 30 mg via INTRAVENOUS
  Filled 2022-04-26: qty 1

## 2022-04-26 MED ORDER — ONDANSETRON 4 MG PO TBDP: 4.0000 mg | ORAL_TABLET | Freq: Three times a day (TID) | ORAL | 0 refills | Status: DC | PRN

## 2022-04-26 MED ORDER — SODIUM CHLORIDE 0.9 % IV BOLUS
1000.0000 mL | Freq: Once | INTRAVENOUS | Status: AC
  Administered 2022-04-26: 1000 mL via INTRAVENOUS

## 2022-04-26 MED ORDER — IOHEXOL 300 MG/ML  SOLN
100.0000 mL | Freq: Once | INTRAMUSCULAR | Status: AC | PRN
  Administered 2022-04-26: 80 mL via INTRAVENOUS

## 2022-04-26 NOTE — ED Provider Notes (Signed)
[facility] EMERGENCY DEPT Provider Note   CSN: [id number] Arrival date & time: 04/26/22  1909     History {Add pertinent medical, surgical, social history, OB history to HPI:1} Chief Complaint  Patient presents with   Fatigue   Lymphadenopathy   Weight Loss    Karen Hayden is a 46 y.o. female with history of type 1 diabetes presenting to ED with multiple complaints.  The patient reports she has had weight loss, extreme fatigue, persistent headache, itchiness, right inguinal lymphadenopathy, ongoing for 2 months.  She does not have a PCP.  She has not traveled outside the country.  She has never been incarcerated been around anyone that she is aware with TB.  HPI     Home Medications Prior to Admission medications   Medication Sig Start Date End Date Taking? Authorizing Provider  insulin NPH Human (NOVOLIN N) 100 UNIT/ML injection Inject 16 Units into the skin 2 (two) times daily before a meal.    Yes [provider]  insulin regular (NOVOLIN R) 100 units/mL injection Inject into the skin See admin instructions. 1 unit per 8 grams of carbs.at meals and to correct high blood sugar.   Yes [provider]  INSULIN SYRINGE .5CC/28G (INS SYRINGE/NEEDLE .5CC/28G) 28G X 1/2" 0.5 ML MISC Use as instructed to administer insulin 05/10/17  Yes Bonnielee Haff, MD  levothyroxine (SYNTHROID) 100 MCG tablet Take 100 mcg by mouth daily. 04/23/21  Yes [provider]  diltiazem (CARDIZEM CD) 120 MG 24 hr capsule Take 1 capsule by mouth once daily Patient not taking: Reported on 10/02/2021 05/02/20   Rex Kras, DO  HYDROcodone-acetaminophen (NORCO/VICODIN) 5-325 MG tablet Take 2 tablets by mouth every 4 (four) hours as needed. 10/02/21   Mickie Hillier, PA-C  ibuprofen (ADVIL) 600 MG tablet Take 1 tablet (600 mg total) by mouth every 6 (six) hours as needed. Patient not taking: Reported on 10/02/2021 01/01/21   Charlesetta Shanks, MD  nitroGLYCERIN (NITROSTAT) 0.4 MG  SL tablet Place 1 tablet (0.4 mg total) under the tongue every 5 (five) minutes as needed for chest pain. Patient not taking: Reported on 10/02/2021 08/12/18   Dorothy Spark, MD  promethazine (PHENERGAN) 25 MG tablet Take 1 tablet (25 mg total) by mouth every 6 (six) hours as needed for nausea or vomiting. Patient not taking: Reported on 10/02/2021 05/04/19   Mariel Aloe, MD  trimethoprim (TRIMPEX) 100 MG tablet Take 100 mg by mouth daily. 07/09/21   [provider]      Allergies    Seroquel [quetiapine fumarate], Lipitor [atorvastatin], Codeine, and Isosorbide    Review of Systems   Review of Systems  Physical Exam Updated Vital Signs BP (!) 171/82   Pulse (!) 56   Temp 98.1 F (36.7 C) (Oral)   Resp 18   Ht '5\' 1"'$  (1.549 m)   Wt 43.5 kg   SpO2 100%   BMI 18.14 kg/m  Physical Exam Constitutional:      Comments: Thin, frail, chronically ill-appearing  HENT:     Head: Normocephalic and atraumatic.  Eyes:     Conjunctiva/sclera: Conjunctivae normal.     Pupils: Pupils are equal, round, and reactive to light.  Cardiovascular:     Rate and Rhythm: Normal rate and regular rhythm.  Pulmonary:     Effort: Pulmonary effort is normal. No respiratory distress.  Abdominal:     General: There is no distension.     Tenderness: There is no abdominal tenderness.  Genitourinary:    Comments: Bilateral inguinal lymphadenopathy Skin:    General: Skin is warm and dry.  Neurological:     General: No focal deficit present.     Mental Status: She is alert. Mental status is at baseline.  Psychiatric:        Mood and Affect: Mood normal.        Behavior: Behavior normal.     ED Results / Procedures / Treatments   Labs (all labs ordered are listed, but only abnormal results are displayed) Labs Reviewed  BASIC METABOLIC PANEL - Abnormal; Notable for the following components:      Result Value   Glucose, Bld 420 (*)    All other components within normal limits  CBC -  Abnormal; Notable for the following components:   RBC 5.29 (*)    All other components within normal limits  URINALYSIS, ROUTINE W REFLEX MICROSCOPIC - Abnormal; Notable for the following components:   Color, Urine COLORLESS (*)    Glucose, UA >1,000 (*)    All other components within normal limits  CBG MONITORING, ED - Abnormal; Notable for the following components:   Glucose-Capillary 397 (*)    All other components within normal limits  SARS CORONAVIRUS 2 BY RT PCR  HEPATIC FUNCTION PANEL    EKG None  Radiology No results found.  Procedures Procedures  {Document cardiac monitor, telemetry assessment procedure when appropriate:1}  Medications Ordered in ED Medications  ketorolac (TORADOL) 30 MG/ML injection 30 mg (has no administration in time range)  sodium chloride 0.9 % bolus 1,000 mL (has no administration in time range)  diphenhydrAMINE (BENADRYL) injection 25 mg (has no administration in time range)  prochlorperazine (COMPAZINE) injection 10 mg (has no administration in time range)    ED Course/ Medical Decision Making/ A&P Clinical Course as of 04/26/22 2147  Sun Apr 26, 2022  2123 12 units in AM and 6 units at night Long acting, rapid sliding scale [MT]    Clinical Course User Index [MT] Embry Manrique, Carola Rhine, MD                           Medical Decision Making Amount and/or Complexity of Data Reviewed Labs: ordered. Radiology: ordered.  Risk Prescription drug management.   This patient presents to the ED with concern for headache, fatigue, weight loss. This involves an extensive number of treatment options, and is a complaint that carries with it a high risk of complications and morbidity.  The differential diagnosis includes malignancy versus anemia versus electrolyte derangement versus diabetic complication versus other  Co-morbidities that complicate the patient evaluation: History of type 1 diabetes at high risk for diabetic complication  I ordered  and personally interpreted labs.  The pertinent results include:  ***  I ordered imaging studies including CT of the head, CT of the abdomen pelvis, x-ray of the chest I independently visualized and interpreted imaging which showed *** I agree with the radiologist interpretation  The patient was maintained on a cardiac monitor.  I personally viewed and interpreted the cardiac monitored which showed an underlying rhythm of: Sinus rhythm  Per my interpretation the patient's ECG shows sinus rhythm no acute ischemic findings  I ordered medication including IV fluid and migraine cocktail for headache and hydration I have reviewed the patients home medicines and have made adjustments as needed  Test Considered: Lower suspicion for meningitis, acute PE at this time.  I requested consultation with the ***,  and discussed lab and imaging findings as well as pertinent plan - they recommend: ***  After the interventions noted above, I reevaluated the patient and found that they have: {resolved/improved/worsened:23923::"improved"}  Social Determinants of Health:***  Dispostion:  After consideration of the diagnostic results and the patients response to treatment, I feel that the patent would benefit from ***.   {Document critical care time when appropriate:1} {Document review of labs and clinical decision tools ie heart score, Chads2Vasc2 etc:1}  {Document your independent review of radiology images, and any outside records:1} {Document your discussion with family members, caretakers, and with consultants:1} {Document social determinants of health affecting pt's care:1} {Document your decision making why or why not admission, treatments were needed:1} Final Clinical Impression(s) / ED Diagnoses Final diagnoses:  None    Rx / DC Orders ED Discharge Orders     None

## 2022-04-26 NOTE — Discharge Instructions (Signed)
Please take a few minutes to read these important discharge instructions below: You have been diagnosed with and treated for a presumed sexually transmitted infection such as chlamydia, gonorrhea, or trichomoniasis.  Chlamydia and gonorrhea are the most commonly reported communicable diseases in the [place]. They are especially common among people younger than 47 years of age. Infection without symptoms is common in men and women. Your partner may be infected but not display symptoms.  Complications of these infections without treatment include serious pelvic infections, ectopic pregnancy, and infertility. Other infections such as HIV or HPV, which can occur in patients with sexually transmitted infections, can lead to cancer or death. To minimize the spread of the infection, you should avoid sexual intercourse for 7 days or until symptoms have resolved.  To minimize the spread of the infection, you should advise your sexual partner(s) to be evaluated, tested and treated. This includes all sexual partners within the past 60 days or your last sexual partner if last contact was greater than 60 days.  To minimize the risk of reinfection, you should abstain from sexual intercourse until your sexual partners have been tested and treated.  Consistent condom use is important in preventing the spread of sexually transmitted infections.  Do you need to get re-tested? Studies reveal that infection with chlamydia or gonorrhea is common among those treated within the preceding several months. Testing for gonorrhea and chlamydia in approximately 3 months is recommended even if you believe your sexual partner has been treated. Based on your history or demographics, your doctor may feel that you are higher risk for HIV and syphilis.  Talk to your doctor about whether you should be tested for these diseases at this time.  When should you come back to the Emergency Department? If your symptoms have not  improved after 48 hours from when you received your antibiotics, you should contact your primary care doctor or return to the Emergency Department.  These may be signs of an infection that has not been fully treated. If you develop worsening pain, worsening discharge from your penis or vagina, fevers of greater than 100.4 F, or have any other new or alarming symptoms, you should return promptly to the Emergency Department.  You should follow up with your primary care doctor's office in 1 week to ensure that your symptoms have resolved.

## 2022-04-26 NOTE — ED Triage Notes (Signed)
Pt via pov from home with swollen lymph node in right groin area x 3 weeks; also c/o generalized weakness and weight loss of 15# in last 6 - 8 weeks. Pt also reports night sweats, denies urinary symptoms. Pt states she is T1D, insulin dependent, states her sugars have been erratic over last several weeks, states she feels "really, really sick." Pt alert & oriented, nad noted.

## 2022-05-01 DIAGNOSIS — Z23 Encounter for immunization: Secondary | ICD-10-CM | POA: Diagnosis not present

## 2022-06-11 ENCOUNTER — Ambulatory Visit (INDEPENDENT_AMBULATORY_CARE_PROVIDER_SITE_OTHER): Payer: Medicare Other | Admitting: Internal Medicine

## 2022-06-11 ENCOUNTER — Encounter: Payer: Self-pay | Admitting: Internal Medicine

## 2022-06-11 VITALS — BP 133/78 | HR 73 | Temp 98.5°F | Ht 62.0 in | Wt 98.0 lb

## 2022-06-11 DIAGNOSIS — E1065 Type 1 diabetes mellitus with hyperglycemia: Secondary | ICD-10-CM

## 2022-06-11 DIAGNOSIS — E039 Hypothyroidism, unspecified: Secondary | ICD-10-CM | POA: Diagnosis not present

## 2022-06-11 DIAGNOSIS — F32A Depression, unspecified: Secondary | ICD-10-CM | POA: Diagnosis not present

## 2022-06-11 DIAGNOSIS — F319 Bipolar disorder, unspecified: Secondary | ICD-10-CM | POA: Diagnosis not present

## 2022-06-11 DIAGNOSIS — R634 Abnormal weight loss: Secondary | ICD-10-CM

## 2022-06-11 DIAGNOSIS — E109 Type 1 diabetes mellitus without complications: Secondary | ICD-10-CM | POA: Diagnosis not present

## 2022-06-11 HISTORY — DX: Depression, unspecified: F32.A

## 2022-06-11 HISTORY — DX: Abnormal weight loss: R63.4

## 2022-06-11 LAB — POC URINALSYSI DIPSTICK (AUTOMATED)
Bilirubin, UA: NEGATIVE
Blood, UA: NEGATIVE
Glucose, UA: NEGATIVE
Ketones, UA: NEGATIVE
Nitrite, UA: NEGATIVE
Protein, UA: NEGATIVE
Spec Grav, UA: 1.01 (ref 1.010–1.025)
Urobilinogen, UA: 0.2 E.U./dL
pH, UA: 6 (ref 5.0–8.0)

## 2022-06-11 MED ORDER — OLANZAPINE 5 MG PO TABS: 5.0000 mg | ORAL_TABLET | Freq: Every day | ORAL | 1 refills | Status: DC

## 2022-06-11 MED ORDER — GVOKE HYPOPEN 2-PACK 0.5 MG/0.1ML ~~LOC~~ SOAJ: 0.5000 mg | Freq: Every day | SUBCUTANEOUS | 11 refills | Status: DC | PRN

## 2022-06-11 NOTE — Progress Notes (Signed)
Today's healthcare provider: Loralee Pacas, MD  Phone: [phone]  New patient visit  Visit Date: 06/11/2022 Patient: Karen Hayden   DOB: 05/13/1976   46 y.o. Female  MRN: [id number]  Assessment and Plan:     [name] was seen today for establish care.  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  Dispense: 0.2 mL; Refill: 11 -     Ambulatory referral to Endocrinology -     Microalbumin / creatinine urine ratio -     POCT Urinalysis Dipstick (Automated)  Type 1 diabetes mellitus with hyperglycemia (HCC)  Assessment & Plan: Currently has been lost to follow-up with her endocrinologist but willing to reestablish I will go ahead and rereferred to make sure she can get back to him promptly this was all due to severe uncontrolled depression and is putting her life at risk so I hope she can get back in quickly  Currently severely uncontrolled associated with depression associated nonadherence and brittle diabetes nonadherence I encouraged her to go to the emergency room to rule out diabetic ketoacidosis and arrange for her to have a G. Volk pen to be able to safely restart insulin if she chooses not to go or is unable to go for any reason she understood clearly with her history of repeated repetitive DKA in the past and reports she has insulin at home  Her current regimen is 12 units of long-acting and 1 unit per 10 g carbs of short acting she has both available that she just has not been taking the short acting  Discussed the importance of taking a safe dose that keeps the sugar levels under 200 and drinking plenty of fluid to avoid DKA encouraged Gatorade zero  She seems dry but does not have any evidence of confusion and denies any nausea and vomiting I suspect she is in chronic mild DKA - but ketones were negative on ua and she denied n/v abdominal pain today  Safety measure the insulin I am going to give her a  G6 because that also could work with getting an insulin pump back if she goes with the tandem pump with her endocrinologist I am also going to try to get blood work and see where she is at on electrolytes because those can get really out of whack with chronically high sugars  Orders: -     Gvoke HypoPen 2-Pack; Inject 0.5 mg into the skin daily as needed (for low blood sugar).  Dispense: 0.2 mL; Refill: 11 -     Ambulatory referral to Endocrinology -     CBC -     Comprehensive metabolic panel -     Hemoglobin A1c -     POCT Urinalysis Dipstick (Automated)  Acquired hypothyroidism Overview: Had radiation in May 2020 for hyperthyroidism at that time has been hypothyroid since Cause of Hypo/hyperthyroidism not known to patient  Assessment & Plan: Associated with recently lots of weight loss Need to check TSH and free T4 is out of date She has 5 days of meds left we will wait till we have the results to decide about what dose to call and Advised her just to go ahead and keep taking the current dose 100 mcg for now   Orders: -     TSH -     T4, free -     POCT Urinalysis Dipstick (Automated)  Unintentional weight loss Overview:  Associated with grief, unmanaged dm 1 and thyroid disease 127->98 lb as of 06/11/22  Orders: -     POCT Urinalysis Dipstick (Automated)  Depressive disorder Overview: Was on Zyprexa in the past which helps her depression and her mood and her weight gain needs but to her knowledge does not have a diagnosis of schizophrenia or bipolar and denies any history of psychotic symptoms  Seems to be associated primarily with grief  Orders: -     Ambulatory referral to Psychiatry -     Ambulatory referral to Psychology -     POCT Urinalysis Dipstick (Automated) -     OLANZapine; Take 1 tablet (5 mg total) by mouth at bedtime.  Dispense: 90 tablet; Refill: 1  Bipolar 1 disorder ([place]) Assessment & Plan: Resume zyprexa she reports she gets manic at times since  stopping but it was helping.  Orders: -     Ambulatory referral to Psychiatry -     Ambulatory referral to Psychology -     POCT Urinalysis Dipstick (Automated) -     OLANZapine; Take 1 tablet (5 mg total) by mouth at bedtime.  Dispense: 90 tablet; Refill: 1      Health Maintenance  Topic Date Due   HEMOGLOBIN A1C  10/31/2019   Diabetic kidney evaluation - Urine ACR  04/22/2022   OPHTHALMOLOGY EXAM  06/11/2022 (Originally 03/29/1986)   FOOT EXAM  06/12/2022 (Originally 03/29/1986)   COLONOSCOPY (Pts 45-40yr Insurance coverage will need to be confirmed)  06/12/2023 (Originally 03/29/2020)   Diabetic kidney evaluation - GFR measurement  04/27/2023   TETANUS/TDAP  08/12/2023   INFLUENZA VACCINE  Completed   Hepatitis C Screening  Completed   HIV Screening  Completed   HPV VACCINES  Aged Out   PAP SMEAR-Modifier  Discontinued   COVID-19 Vaccine  Discontinued     Recommended follow up: Return in about 1 week (around 06/18/2022) for review response to recent changes, close follow up diabetes, depression.   Subjective:  Patient presents today to establish care.  Prior patient of Bindubal Balan- no primary care prior.  Chief Complaint  Patient presents with   ERumachild about 4 years ago, since then has had a drastic weight loss. Pt states when child passed she was 127 pounds, she is now 989 Pt is type 1 diabetic and has had very blood sugar, she woke up this morning and blood sugar was 515. Pt has hyperthyriodism and it hasnt been checked in a while, and pt states she's also at the end of her hormone medicine. Pt states she has been having headaches and believes its related to high blood pressure.   Lost child about 4 years ago, her daughter had blood clot that went to her lungs.  since then has had a drastic weight loss. Pt states when child passed she was 127 pounds, she is now 965 Pt is type 1 diabetic and has had very blood sugar, she woke up this morning and blood  sugar was 515. Pt has hyperthyriodism and it hasnt been checked in a while, and pt states she's also at the end of her hormone medicine. Pt states she has been having headaches and believes its related to high blood pressure.  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  She reports her increased to this we will order her kids and grandkids that remain but she is overwhelmed by grief and it is hard to want to  care for herself and says she is let her diabetes type 1 get very out of control she is not been taking her insulin consistently.   v been following with her endocrinologist used to have a Medtronic MiniMed she also was using Dexcom and had much better control of her diabetes but she got so depressed that she just stopped using it and went back on fingersticks and taking medicine on her own but then did not do that consistently also due to the depression.  She used to be on Zyprexa but stopped taking it without any reason other than just probably depression  She started getting some weird reactions to cannabis so she stopped taking it afraid that maybe its been tampered with so she has been off of all substances for a month no also another factor in the insulin management as she has history of repetitive DKA severe brittle diabetes and it makes her anxious to take the fast acting insulin as it might bottom her out  For history taking, I took a per problem history from the patient and chart review as follows: Problem  Hypothyroidism   Had radiation in May 2020 for hyperthyroidism at that time has been hypothyroid since Cause of Hypo/hyperthyroidism not known to patient   Unintentional Weight Loss   Associated with grief, unmanaged dm 1 and thyroid disease 127->98 lb as of 06/11/22   Depressive Disorder   Was on Zyprexa in the past which helps her depression and her mood and her weight gain needs but to her knowledge does not have a diagnosis of schizophrenia or bipolar and  denies any history of psychotic symptoms  Seems to be associated primarily with grief   Bipolar 1 Disorder (Hcc)  Type 1 Diabetes Mellitus (Hcc)          Depression Screen     No data to display         Results for orders placed or performed in visit on 06/11/22  POCT Urinalysis Dipstick (Automated)  Result Value Ref Range   Color, UA yellow    Clarity, UA clear    Glucose, UA Negative Negative   Bilirubin, UA Negative    Ketones, UA Negative    Spec Grav, UA 1.010 1.010 - 1.025   Blood, UA Negative    pH, UA 6.0 5.0 - 8.0   Protein, UA Negative Negative   Urobilinogen, UA 0.2 0.2 or 1.0 E.U./dL   Nitrite, UA Negative    Leukocytes, UA Trace (A) Negative     The following were reviewed and entered/updated in epic: Past Medical History:  Diagnosis Date   Anxiety    Bipolar 1 disorder (HCC)    Chronic kidney infection    Coronary artery spasm ([place])    Depressive disorder 06/11/2022   Was on Zyprexa in the past which helps her depression and her mood and her weight gain needs but to her knowledge does not have a diagnosis of schizophrenia or bipolar and denies any history of psychotic symptoms   Diabetes mellitus type I ([place])    Diabetic gastroparesis ([place])    DM (diabetes mellitus) ([place])    Gastroparesis    Due to diabetes    Hyperlipidemia    Hypertension    Hypothyroidism    Has thyroiditis with subsequent hyperthyroidism so off Synthroid   Seizure disorder ([place])    Umbilical hernia    Past Surgical History:  Procedure Laterality Date   ADENOIDECTOMY     CARDIAC CATHETERIZATION  LEFT HEART CATH AND CORONARY ANGIOGRAPHY N/A 02/18/2018   Procedure: LEFT HEART CATH AND CORONARY ANGIOGRAPHY;  Surgeon: Jettie Booze, MD;  Location: [facility];  Service: Cardiovascular;  Laterality: N/A;   TONSILLECTOMY     VAGINAL HYSTERECTOMY     left ovary removed , has her right ovary   Past Surgical History:  Procedure Laterality Date    ADENOIDECTOMY     CARDIAC CATHETERIZATION     LEFT HEART CATH AND CORONARY ANGIOGRAPHY N/A 02/18/2018   Procedure: LEFT HEART CATH AND CORONARY ANGIOGRAPHY;  Surgeon: Jettie Booze, MD;  Location: [facility];  Service: Cardiovascular;  Laterality: N/A;   TONSILLECTOMY     VAGINAL HYSTERECTOMY     left ovary removed , has her right ovary   Family Status  Relation Name Status   Mother  Deceased   Father  Alive   Daughter  Deceased   Ethlyn Daniels  (Not Specified)   Ethlyn Daniels  (Not Specified)   MGM  Deceased   MGF  Deceased   PGM  Deceased   PGF  Deceased   Neg Hx  (Not Specified)   Family History  Problem Relation Age of Onset   Hypertension Mother    Hyperlipidemia Mother    Heart disease Mother    Early death Mother    Diabetes Mother    Heart attack Mother    Depression Mother    Anxiety disorder Mother    Hearing loss Father    Hyperlipidemia Father    Hypertension Father    Mood Disorder Father    Thyroid disease Father    Pulmonary embolism Daughter    Depression Paternal Aunt    Anxiety disorder Paternal Aunt    Diabetes Paternal Aunt    Diabetes Paternal Aunt    Depression Maternal Grandmother    Anxiety disorder Maternal Grandmother    Goiter Maternal Grandmother    Diabetes Maternal Grandmother    Ovarian cancer Paternal Grandmother        great grandmother   Diabetes Paternal Grandmother    Colon cancer Neg Hx    Outpatient Medications Prior to Visit  Medication Sig Dispense Refill   insulin NPH Human (NOVOLIN N) 100 UNIT/ML injection Inject 16 Units into the skin 2 (two) times daily before a meal.      insulin regular (NOVOLIN R) 100 units/mL injection Inject into the skin See admin instructions. 1 unit per 8 grams of carbs.at meals and to correct high blood sugar.     INSULIN SYRINGE .5CC/28G (INS SYRINGE/NEEDLE .5CC/28G) 28G X 1/2" 0.5 ML MISC Use as instructed to administer insulin 60 each 1   levothyroxine (SYNTHROID) 100 MCG tablet Take  100 mcg by mouth daily.     diltiazem (CARDIZEM CD) 120 MG 24 hr capsule Take 1 capsule by mouth once daily (Patient not taking: Reported on 10/02/2021) 30 capsule 3   HYDROcodone-acetaminophen (NORCO/VICODIN) 5-325 MG tablet Take 2 tablets by mouth every 4 (four) hours as needed. (Patient not taking: Reported on 06/11/2022) 10 tablet 0   ibuprofen (ADVIL) 600 MG tablet Take 1 tablet (600 mg total) by mouth every 6 (six) hours as needed. (Patient not taking: Reported on 10/02/2021) 30 tablet 0   nitroGLYCERIN (NITROSTAT) 0.4 MG SL tablet Place 1 tablet (0.4 mg total) under the tongue every 5 (five) minutes as needed for chest pain. (Patient not taking: Reported on 10/02/2021) 30 tablet 3   ondansetron (ZOFRAN-ODT) 4 MG disintegrating tablet  Take 1 tablet (4 mg total) by mouth every 8 (eight) hours as needed for nausea or vomiting. (Patient not taking: Reported on 06/11/2022) 20 tablet 0   prochlorperazine (COMPAZINE) 10 MG tablet Take 1 tablet (10 mg total) by mouth 2 (two) times daily as needed for up to 10 days for nausea or vomiting. 10 tablet 0   promethazine (PHENERGAN) 25 MG tablet Take 1 tablet (25 mg total) by mouth every 6 (six) hours as needed for nausea or vomiting. (Patient not taking: Reported on 10/02/2021) 20 tablet 0   trimethoprim (TRIMPEX) 100 MG tablet Take 100 mg by mouth daily. (Patient not taking: Reported on 06/11/2022)     No facility-administered medications prior to visit.    Allergies  Allergen Reactions   Seroquel [Quetiapine Fumarate]     Seizure   Lipitor [Atorvastatin]     Attacks joints, inflamation   Codeine Rash   Isosorbide Palpitations    Rapid HR after 1st dose - ?hypotension with rebound tachy / she tolerates prn SL NTG just fine   Social History   Tobacco Use   Smoking status: Former    Packs/day: 1.00    Years: 13.00    Total pack years: 13.00    Types: Cigarettes    Quit date: 02/17/2018    Years since quitting: 4.3   Smokeless tobacco: Never   Tobacco  comments:    Tobacco info given  Vaping Use   Vaping Use: Former  Substance Use Topics   Alcohol use: Never   Drug use: Not Currently    Types: Marijuana    Comment: a few times a week to stimulate appetite    Immunization History  Administered Date(s) Administered   Hepatitis B, PED/ADOLESCENT 01/14/2015, 02/15/2015, 08/02/2015   Influenza, Quadrivalent, Recombinant, Inj, Pf 05/30/2018, 06/13/2019, 05/16/2021   Influenza,inj,Quad PF,6+ Mos 05/05/2022   Pneumococcal Polysaccharide-23 02/18/2018   Tdap 08/11/2013      Objective:  BP 133/78   Pulse 73   Temp 98.5 F (36.9 C) (Temporal)   Ht '5\' 2"'$  (1.575 m)   Wt 98 lb (44.5 kg)   SpO2 99%   BMI 17.92 kg/m  Body mass index is 17.92 kg/m.  She  is a very cordial and polite person who was a pleasure to meet.  Very underweight, very graceful and gracious and kind.   Gen: NAD, resting comfortably underweight Psych:  mood tearful, sad, responds appropriately without any suggestion of external stimuli or hyperactivity HEENT: Mucous membranes are dry on tongue. Sclera conjunctiva and lids grossly normal Neck: no thyromegaly, no cervical lymphadenopathy Ext: no edema Skin: warm, dry Neuro: grossly intact  Results for orders placed or performed in visit on 06/11/22  POCT Urinalysis Dipstick (Automated)  Result Value Ref Range   Color, UA yellow    Clarity, UA clear    Glucose, UA Negative Negative   Bilirubin, UA Negative    Ketones, UA Negative    Spec Grav, UA 1.010 1.010 - 1.025   Blood, UA Negative    pH, UA 6.0 5.0 - 8.0   Protein, UA Negative Negative   Urobilinogen, UA 0.2 0.2 or 1.0 E.U./dL   Nitrite, UA Negative    Leukocytes, UA Trace (A) Negative

## 2022-06-11 NOTE — Patient Instructions (Addendum)
It was a pleasure seeing you today!  Today the plan is...  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  Dispense: 0.2 mL; Refill: 11 -     Ambulatory referral to Endocrinology -     Microalbumin / creatinine urine ratio -     POCT Urinalysis Dipstick (Automated)  Type 1 diabetes mellitus with hyperglycemia ([place]) Assessment & Plan: Currently has been lost to follow-up with her endocrinologist but willing to reestablish I will go ahead and rereferred to make sure she can get back to him promptly this was all due to severe uncontrolled depression and is putting her life at risk so I hope she can get back in quickly  Currently severely uncontrolled associated with depression associated nonadherence and brittle diabetes nonadherence I encouraged her to go to the emergency room to rule out diabetic ketoacidosis and arrange for her to have a G. Volk pen to be able to safely restart insulin if she chooses not to go or is unable to go for any reason she understood clearly with her history of repeated repetitive DKA in the past and reports she has insulin at home  Her current regimen is 12 units of long-acting and 1 unit per 10 g carbs of short acting she has both available that she just has not been taking the short acting  Discussed the importance of taking a safe dose that keeps the sugar levels under 200 and drinking plenty of fluid to avoid DKA encouraged Gatorade zero  She seems dry but does not have any evidence of confusion and denies any nausea and vomiting I suspect she is in chronic mild DKA  Safety measure the insulin I am going to give her a G6 because that also could work with getting an insulin pump back if she goes with the tandem pump with her endocrinologist I am also going to try to get blood work and see where she is at on electrolytes because those can get really out of whack with chronically high  sugars  Orders: -     Gvoke HypoPen 2-Pack; Inject 0.5 mg into the skin daily as needed (for low blood sugar).  Dispense: 0.2 mL; Refill: 11 -     Ambulatory referral to Endocrinology -     CBC -     Comprehensive metabolic panel -     Hemoglobin A1c -     POCT Urinalysis Dipstick (Automated)  Acquired hypothyroidism Assessment & Plan: Associated with recently lots of weight loss Need to check TSH and free T4 is out of date She has 5 days of meds left we will wait till we have the results to decide about what dose to call and Advised her just to go ahead and keep taking the current dose 100 mcg for now   Orders: -     TSH -     T4, free -     POCT Urinalysis Dipstick (Automated)  Unintentional weight loss -     POCT Urinalysis Dipstick (Automated)  Depressive disorder -     Ambulatory referral to Psychiatry -     Ambulatory referral to Psychology -     POCT Urinalysis Dipstick (Automated) -     OLANZapine; Take 1 tablet (5 mg total) by mouth at bedtime.  Dispense: 90 tablet; Refill: 1  Bipolar 1 disorder ([place]) Assessment & Plan: Resume zyprexa she reports she gets manic  at times since stopping but it was helping.  Orders: -     Ambulatory referral to Psychiatry -     Ambulatory referral to Psychology -     POCT Urinalysis Dipstick (Automated) -     OLANZapine; Take 1 tablet (5 mg total) by mouth at bedtime.  Dispense: 90 tablet; Refill: 1     Loralee Pacas, MD   Return in about 1 week (around 06/18/2022) for review response to recent changes, close follow up diabetes, depression.   - If your condition fails to resolve or you have other questions / concerns: please contact me via phone [phone] or MyChart messaging.  - Please bring all your medicines to your next appointment. This is the best way for me to know exactly what you're taking.  - If your condition begins to worsen or become severe:  go to the ER.   IF you received an x-ray today, you will  receive an invoice from [facility]. Please contact [facility] at [phone] with questions or concerns regarding your invoice.    IF you received labwork today, you will receive an invoice from [facility]. Please contact [facility] at [phone] with questions or concerns regarding your invoice.    Our billing staff will not be able to assist you with questions regarding bills from these companies.   --------------------------------------------------------------------------------------------------------------------  You will be contacted with the lab results as soon as they are available. The fastest way to get your results is to activate your My Chart account. Instructions are located on the last page of this paperwork. If you have not heard from [place] regarding the results in 2 weeks, please contact this office. For any labs or imaging tests, we will call you if the results are significantly abnormal.  Most normal results will be posted to myChart as soon as they are available and I will comment on them there within 2-3 business days.

## 2022-06-11 NOTE — Assessment & Plan Note (Signed)
Associated with recently lots of weight loss Need to check TSH and free T4 is out of date She has 5 days of meds left we will wait till we have the results to decide about what dose to call and Advised her just to go ahead and keep taking the current dose 100 mcg for now

## 2022-06-11 NOTE — Assessment & Plan Note (Signed)
Resume zyprexa she reports she gets manic at times since stopping but it was helping.

## 2022-06-11 NOTE — Assessment & Plan Note (Addendum)
Currently has been lost to follow-up with her endocrinologist but willing to reestablish I will go ahead and rereferred to make sure she can get back to him promptly this was all due to severe uncontrolled depression and is putting her life at risk so I hope she can get back in quickly  Currently severely uncontrolled associated with depression associated nonadherence and brittle diabetes nonadherence I encouraged her to go to the emergency room to rule out diabetic ketoacidosis and arrange for her to have a G. Volk pen to be able to safely restart insulin if she chooses not to go or is unable to go for any reason she understood clearly with her history of repeated repetitive DKA in the past and reports she has insulin at home  Her current regimen is 12 units of long-acting and 1 unit per 10 g carbs of short acting she has both available that she just has not been taking the short acting  Discussed the importance of taking a safe dose that keeps the sugar levels under 200 and drinking plenty of fluid to avoid DKA encouraged Gatorade zero  She seems dry but does not have any evidence of confusion and denies any nausea and vomiting I suspect she is in chronic mild DKA  Safety measure the insulin I am going to give her a G6 because that also could work with getting an insulin pump back if she goes with the tandem pump with her endocrinologist I am also going to try to get blood work and see where she is at on electrolytes because those can get really out of whack with chronically high sugars

## 2022-06-12 LAB — COMPREHENSIVE METABOLIC PANEL
ALT: 22 U/L (ref 0–35)
AST: 27 U/L (ref 0–37)
Albumin: 4.6 g/dL (ref 3.5–5.2)
Alkaline Phosphatase: 64 U/L (ref 39–117)
BUN: 10 mg/dL (ref 6–23)
CO2: 28 mEq/L (ref 19–32)
Calcium: 10 mg/dL (ref 8.4–10.5)
Chloride: 99 mEq/L (ref 96–112)
Creatinine, Ser: 0.82 mg/dL (ref 0.40–1.20)
GFR: 85.31 mL/min (ref >60.00)
Glucose, Bld: 248 mg/dL — ABNORMAL HIGH (ref 70–99)
Potassium: 3.6 mEq/L (ref 3.5–5.1)
Sodium: 136 mEq/L (ref 135–145)
Total Bilirubin: 0.7 mg/dL (ref 0.2–1.2)
Total Protein: 7.3 g/dL (ref 6.0–8.3)

## 2022-06-12 LAB — CBC
HCT: 42.4 % (ref 36.0–46.0)
Hemoglobin: 14 g/dL (ref 12.0–15.0)
MCHC: 33 g/dL (ref 30.0–36.0)
MCV: 83.2 fl (ref 78.0–100.0)
Platelets: 290 10*3/uL (ref 150.0–400.0)
RBC: 5.1 Mil/uL (ref 3.87–5.11)
RDW: 13.3 % (ref 11.5–15.5)
WBC: 6.4 10*3/uL (ref 4.0–10.5)

## 2022-06-12 LAB — MICROALBUMIN / CREATININE URINE RATIO
Creatinine,U: 32.9 mg/dL
Microalb Creat Ratio: 2.1 mg/g (ref 0.0–30.0)
Microalb, Ur: <0.7 mg/dL (ref 0.0–1.9)

## 2022-06-12 LAB — TSH: TSH: 2.54 u[IU]/mL (ref 0.35–5.50)

## 2022-06-12 LAB — HEMOGLOBIN A1C: Hgb A1c MFr Bld: 11.3 % — ABNORMAL HIGH (ref 4.6–6.5)

## 2022-06-12 LAB — T4, FREE: Free T4: 1.08 ng/dL (ref 0.60–1.60)

## 2022-06-15 ENCOUNTER — Other Ambulatory Visit: Payer: Self-pay

## 2022-06-15 ENCOUNTER — Telehealth: Payer: Self-pay | Admitting: Internal Medicine

## 2022-06-15 MED ORDER — ARIPIPRAZOLE 2 MG PO TABS: 2.0000 mg | ORAL_TABLET | Freq: Every day | ORAL | 3 refills | Status: DC

## 2022-06-15 MED ORDER — GLUCAGON 0.5 MG/0.1ML ~~LOC~~ SOAJ: 0.5000 mg | Freq: Every day | SUBCUTANEOUS | 11 refills | Status: DC

## 2022-06-15 NOTE — Telephone Encounter (Signed)
Patient states: -She wanted to clarify that it was not the Zyprexa that wasn't covered by her insurance.  - The glucagon was the medication that wasn't covered and would cost her $450 out of pocket - She would like to continue using zyprexa since it works so well   Patient requests: -An alternative to glucagon that is covered by her insurance

## 2022-06-15 NOTE — Telephone Encounter (Signed)
Spoke with patient and informed her that I sent in a generic glucagon injection prescription (per Dr.Morrison) to her pharmacy on file. She informed me that the Zyprexa is actually covered by her insurance and this prescription was picked up on last Thursday.

## 2022-06-15 NOTE — Progress Notes (Signed)
Your A1c is really bad so the last 3 months your sugar has been really out of control and organ to have to work hard on getting it fixed.  Please come in as soon as convenient for you that you can get on the schedule and lets work on it some.  Thyroid is looking good and the rest of the labs look pretty good

## 2022-06-18 ENCOUNTER — Ambulatory Visit (INDEPENDENT_AMBULATORY_CARE_PROVIDER_SITE_OTHER): Payer: Medicare Other | Admitting: Internal Medicine

## 2022-06-18 ENCOUNTER — Encounter: Payer: Self-pay | Admitting: Internal Medicine

## 2022-06-18 VITALS — BP 132/76 | HR 88 | Temp 98.6°F | Resp 12 | Ht 62.0 in | Wt 95.4 lb

## 2022-06-18 DIAGNOSIS — E039 Hypothyroidism, unspecified: Secondary | ICD-10-CM | POA: Diagnosis not present

## 2022-06-18 DIAGNOSIS — M2550 Pain in unspecified joint: Secondary | ICD-10-CM | POA: Insufficient documentation

## 2022-06-18 DIAGNOSIS — E1065 Type 1 diabetes mellitus with hyperglycemia: Secondary | ICD-10-CM | POA: Diagnosis not present

## 2022-06-18 DIAGNOSIS — I10 Essential (primary) hypertension: Secondary | ICD-10-CM | POA: Insufficient documentation

## 2022-06-18 DIAGNOSIS — F32A Depression, unspecified: Secondary | ICD-10-CM

## 2022-06-18 DIAGNOSIS — E109 Type 1 diabetes mellitus without complications: Secondary | ICD-10-CM | POA: Insufficient documentation

## 2022-06-18 DIAGNOSIS — L709 Acne, unspecified: Secondary | ICD-10-CM | POA: Insufficient documentation

## 2022-06-18 DIAGNOSIS — G609 Hereditary and idiopathic neuropathy, unspecified: Secondary | ICD-10-CM | POA: Insufficient documentation

## 2022-06-18 DIAGNOSIS — E7800 Pure hypercholesterolemia, unspecified: Secondary | ICD-10-CM | POA: Insufficient documentation

## 2022-06-18 DIAGNOSIS — F319 Bipolar disorder, unspecified: Secondary | ICD-10-CM

## 2022-06-18 DIAGNOSIS — E785 Hyperlipidemia, unspecified: Secondary | ICD-10-CM | POA: Insufficient documentation

## 2022-06-18 HISTORY — DX: Acne, unspecified: L70.9

## 2022-06-18 HISTORY — DX: Type 1 diabetes mellitus without complications: E10.9

## 2022-06-18 MED ORDER — LEVOTHYROXINE SODIUM 100 MCG PO TABS: 100.0000 ug | ORAL_TABLET | Freq: Every day | ORAL | 3 refills | Status: DC

## 2022-06-18 MED ORDER — DEXCOM G7 RECEIVER DEVI: 1 refills | Status: DC

## 2022-06-18 MED ORDER — DEXCOM G7 SENSOR MISC: 11 refills | Status: DC

## 2022-06-18 NOTE — Assessment & Plan Note (Signed)
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  I encouraged her to take it medicine daily on an empty stomach with water and no food and then no other meds within an hour or 2. In the future I am going to do more research on the cause of why she needed the radiation but I suspect Graves' disease Karen Hayden just need to look through the deep records on her

## 2022-06-18 NOTE — Assessment & Plan Note (Addendum)
   Diabetes is currently poorly controlled but improving with mood this week Lab Results  Component Value Date   HGBA1C 11.3 (H) 06/11/2022   HGBA1C 7.8 (H) 05/03/2019   HGBA1C 7.7 (H) 02/17/2018       Diabetic education: ongoing education regarding chronic disease management for diabetes was given today. We continue to reinforce the ABC's of diabetic management: A1c (<7 or 8 dependent upon patient), tight blood pressure control, and cholesterol management with goal LDL < 100 minimally.  Recent loss of control she reports has improved but still had over 500 at one point last week.    I know that what she mainly needs is better data on CGM so I was able to put a dexcom g7 sensor on her today but it didn't work with her new android phone.  I tried to set it up with clarity but it wont work Horticulturist, commercial.  Therefore I advised try her home tablet device and if that doesn't work contact my office I will let her borrow a device so she can monitor her sugar and adjust insulin  She now as glucagon pen. I advised  Increasing insulin intake and increasing hydration this week, safely, using the CGM.  Also advised she send data via mychart and hopefully invite me to her clarity so I can review and assist.  Endo referral is pending.

## 2022-06-18 NOTE — Patient Instructions (Addendum)
It was a pleasure seeing you today!  Today the plan is...  Depressive disorder -     Dexcom G7 Sensor; Apply sensor every 10 days. Pharmacy please provide 3 boxes (1 sensor per week)  Dispense: 3 each; Refill: 11 -     Dexcom G7 Receiver; Please provide 1 dexcom g7 receiver  Dispense: 1 each; Refill: 1  Bipolar 1 disorder ([place]) -     Dexcom G7 Sensor; Apply sensor every 10 days. 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 We continue to reinforce the ABC's of diabetic management: A1c (<7 or 8 dependent upon patient), tight blood pressure control, and cholesterol management with goal LDL < 100 minimally.  Recent loss of control she reports has improved but still had over 500 at one point last week.    I know that what she mainly needs is better data on CGM so I was able to put a dexcom g7 sensor on her today but it didn't work with her new android phone.  I tried to set it up with clarity but it wont work Horticulturist, commercial.  Therefore I advised try her home tablet device and if that doesn't work contact my office I will let her borrow a device so she can monitor her sugar and adjust insulin  She now as glucagon pen. I advised  Increasing insulin intake and increasing hydration this week, safely, using the CGM.  Also advised she send data via mychart and hopefully invite me to her clarity so I can review and assist.  Endo referral is pending.   Orders: -     Levothyroxine Sodium; Take 1 tablet (100 mcg total) by  mouth daily.  Dispense: 90 tablet; Refill: 3 -     Dexcom G7 Sensor; Apply sensor every 10 days. 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  I encouraged her to take it medicine daily on an empty stomach with water and no food and then no other meds within an hour or 2. In the future I am going to do more research on the cause of why she needed the radiation but I suspect Graves' disease Vaughan Basta just need to look through the deep records on her  Orders: -     Levothyroxine Sodium; Take 1 tablet (100 mcg total) by mouth daily.  Dispense: 90 tablet; Refill: 3 -     Dexcom G7 Sensor; Apply sensor every 10 days. Pharmacy please provide 3 boxes (1 sensor per week)  Dispense: 3 each; Refill: 11 -     Dexcom G7 Receiver; Please provide 1 dexcom g7 receiver  Dispense: 1 each; Refill: 1  Type 1 diabetes mellitus with hyperglycemia ([place]) -     Dexcom G7 Sensor; Apply sensor every 10 days. Pharmacy please provide 3 boxes (1 sensor per week)  Dispense: 3 each; Refill: 11 -     Dexcom G7 Receiver; Please provide 1 dexcom g7 receiver  Dispense: 1 each; Refill: 1     Loralee Pacas, MD   No follow-ups on file.   - If your condition fails to resolve or you have other questions / concerns: please contact me via phone [phone] or MyChart messaging.  - Please bring all your medicines to your next appointment. This is the best way for me to know exactly what you're taking.  - If your condition begins to worsen or become severe:  go to the ER.   IF you received an x-ray today, you will receive an invoice from [facility]. Please contact [facility] at [phone] with questions or concerns regarding your invoice.    IF you received labwork today, you will receive an invoice from [facility]. Please contact [facility] at [phone] with questions or concerns regarding your invoice.    Our billing staff will not be able to assist you with questions regarding bills from these companies.   --------------------------------------------------------------------------------------------------------------------  You will be contacted with the lab results as soon as they are available. The fastest way to get your results is to activate your My Chart account. Instructions are located on the last page of this paperwork. If you have not heard from [place] regarding the results in 2 weeks, please contact this office. For any labs or imaging tests, we will call you if the results are significantly abnormal.  Most normal results will be posted to myChart as soon as they are available and I will comment on them there within 2-3 business days.

## 2022-06-18 NOTE — Progress Notes (Signed)
[facility] at [place]:  [phone]   Routine Medical Office Visit  Patient:  Karen Hayden      Age: 46 y.o.       Sex:  female  Date:   06/18/2022  PCP:    ,  G, MD    Today's Healthcare Provider:  G , MD  Assessment/Plan:   Karen Hayden was seen today for one week follow-up.  Depressive disorder Overview: She has history of bipolar 1 and depression and does really well on Zyprexa which was recently restarted and she reports it is doing great. We have a pending behavioral health and psych referral and they both call to arrange and I encouraged her to follow through with that as well as doing motivational therapy with either cognitive behavioral therapy solution focused therapy or really any kind of therapy using [place] Seems to be associated primarily with grief   Orders: -     Dexcom G7 Sensor; Apply sensor every 10 days. Pharmacy please provide 3 boxes (1 sensor per week)  Dispense: 3 each; Refill: 11 -     Dexcom G7 Receiver; Please provide 1 dexcom g7 receiver  Dispense: 1 each; Refill: 1  Bipolar 1 disorder (HCC) -     Dexcom G7 Sensor; Apply sensor every 10 days. Pharmacy please provide 3 boxes (1 sensor per week)  Dispense: 3 each; Refill: 11 -     Dexcom G7 Receiver; Please provide 1 dexcom g7 receiver  Dispense: 1 each; Refill: 1  Brittle diabetes mellitus (HCC) Overview: gvoke not covered but she has glucagon. Not well controlled due to depression and cost of medicine/cgm making mgmt difficult, also history of severe lows.  Assessment & Plan: Diabetes is currently poorly controlled but improving with mood this week Lab Results  Component Value Date   HGBA1C 11.3 (H) 06/11/2022   HGBA1C 7.8 (H) 05/03/2019   HGBA1C 7.7 (H) 02/17/2018      Diabetic education: ongoing education regarding chronic disease management for diabetes was given today. We continue to reinforce the ABC's of diabetic management: A1c (<7 or 8 dependent upon  patient), tight blood pressure control, and cholesterol management with goal LDL < 100 minimally.  Recent loss of control she reports has improved but still had over 500 at one point last week.    I know that what she mainly needs is better data on CGM so I was able to put a dexcom g7 sensor on her today but it didn't work with her new android phone.  I tried to set it up with clarity but it wont work without receiver.  Therefore I advised try her home tablet device and if that doesn't work contact my office I will let her borrow a device so she can monitor her sugar and adjust insulin  She now as glucagon pen. I advised  Increasing insulin intake and increasing hydration this week, safely, using the CGM.  Also advised she send data via mychart and hopefully invite me to her clarity so I can review and assist.  Endo referral is pending.   Orders: -     Levothyroxine Sodium; Take 1 tablet (100 mcg total) by mouth daily.  Dispense: 90 tablet; Refill: 3 -     Dexcom G7 Sensor; Apply sensor every 10 days. 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  I encouraged her to take it medicine daily on an empty stomach with water and no food and then no other meds within an hour or 2. In the future I am going to do more research on the cause of why she needed the radiation but I suspect Graves' disease Linda just need to look through the deep records on her  Orders: -     Levothyroxine Sodium; Take 1 tablet (100 mcg total) by  mouth daily.  Dispense: 90 tablet; Refill: 3 -     Dexcom G7 Sensor; Apply sensor every 10 days. Pharmacy please provide 3 boxes (1 sensor per week)  Dispense: 3 each; Refill: 11 -     Dexcom G7 Receiver; Please provide 1 dexcom g7 receiver  Dispense: 1 each; Refill: 1  Type 1 diabetes mellitus with hyperglycemia (HCC) Overview:    Orders: -     Dexcom G7 Sensor; Apply sensor every 10 days. Pharmacy please provide 3 boxes (1 sensor per week)  Dispense: 3 each; Refill: 11 -     Dexcom G7 Receiver; Please provide 1 dexcom g7 receiver  Dispense: 1 each; Refill: 1     Return in about 1 week (around 06/25/2022) for close follow up brittle diabetes, G7 sensor mgmt.depression.   Today's key discussion points - also in After Visit Summary (AVS) Common side effects, risks, benefits, and alternatives for medications and treatment plan prescribed today were discussed, and she expressed understanding of the given instructions.  Medication list was reconciled and patient instructions and summary information was documented and made available for her to review in the AVS (see AVS).  This note is also available to patient for review for accuracy and understanding. She was encouraged to contact our office by phone or message via MyChart if she has any questions or concerns regarding our treatment plan (see AVS).  No barriers to understanding were identified We discussed red flag symptoms and signs in detail and when to call the office or go to ER if her condition worsens (see AFTER VISIT SUMMARY).. She expressed understanding.    Subjective:   Karen Hayden is a 46 y.o. female with PMH significant for: Past Medical History:  Diagnosis Date   Anxiety    Bipolar 1 disorder (HCC)    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   Chronic kidney infection    Coronary artery spasm (HCC)    Depressive disorder 06/11/2022   Was on Zyprexa in the past which helps her depression and her mood and her weight  gain needs but to her knowledge does not have a diagnosis of schizophrenia or bipolar and denies any history of psychotic symptoms   Diabetes mellitus type I (HCC)    Diabetic gastroparesis (HCC)    DM (diabetes mellitus) (HCC)    Gastroparesis    Due to diabetes    Hyperlipidemia    Hypertension    Hypothyroidism    Has thyroiditis with subsequent hyperthyroidism so off Synthroid   Seizure disorder (HCC)    Umbilical hernia      She is presenting today with: Chief Complaint  Patient presents with   One week follow-up      Feels better, eating better, blood sugar has been slightly better.     Additional physician collected history: See Assessment/Plan section for per problem updates to history (overview and a/p subsections) as reported by patient today. Reports mood much better since getting zyprexa but still struggling to get DM under control Couldn't get gvoke (400$) but glucagon injector covered (but not as good) Sugars as high as 500s.... had syncope and hit head against head board with laceration, but no neurologic deficit., initial neck pain gone now and tenderness at site of head injury        Objective:  Physical Exam: BP 132/76 (BP Location: Right Arm, Patient Position: Sitting)   Pulse 88   Temp 98.6 F (37 C) (Temporal)   Resp 12   Ht 5' 2" (1.575 m)   Wt 95 lb 6.4 oz (43.3 kg)   SpO2 97%   BMI 17.45 kg/m   She is a polite, friendly, and genuine person Constitutional: NAD, AAO, not ill-appearing  Neuro: alert, no focal deficit obvious, articulate speech Psych: normal mood, behavior, thought content   Problem specific physical exam findings:  Neurologic testing good- normal eomi, neg romberg, stands on 1 food, peter piper picks a peck of pickled peppers is articulated rapidly, and no facial symmetry.  Tongue is still dry  Recent Results (from the past 2160 hour(s))  CBG monitoring, ED     Status: Abnormal   Collection Time: 04/26/22  7:37 PM  Result Value  Ref Range   Glucose-Capillary 397 (H) 70 - 99 mg/dL    Comment: Glucose reference range applies only to samples taken after fasting for at least 8 hours.  Basic metabolic panel     Status: Abnormal   Collection Time: 04/26/22  7:50 PM  Result Value Ref Range   Sodium 135 135 - 145 mmol/L   Potassium 4.0 3.5 - 5.1 mmol/L   Chloride 98 98 - 111 mmol/L   CO2 28 22 - 32 mmol/L   Glucose, Bld 420 (H) 70 - 99 mg/dL    Comment: Glucose reference range applies only to samples taken after fasting for at least 8 hours.   BUN 14 6 - 20 mg/dL   Creatinine, Ser 0.91 0.44 - 1.00 mg/dL   Calcium 10.1 8.9 - 10.3 mg/dL   GFR, Estimated >60 >60 mL/min    Comment: (NOTE) Calculated using the CKD-EPI Creatinine Equation (2021)    Anion gap 9 5 - 15    Comment: Performed at [facility], [address], [place], [place] [address]  CBC     Status: Abnormal   Collection Time: 04/26/22  7:50 PM  Result Value Ref Range   WBC 6.0 4.0 - 10.5 K/uL   RBC 5.29 (H) 3.87 - 5.11 MIL/uL   Hemoglobin 14.5 12.0 - 15.0 g/dL   HCT 43.9 36.0 - 46.0 %   MCV 83.0 80.0 - 100.0 fL   MCH 27.4 26.0 - 34.0 pg   MCHC 33.0 30.0 - 36.0 g/dL   RDW 12.8 11.5 - 15.5 %   Platelets 349 150 - 400 K/uL   nRBC 0.0 0.0 - 0.2 %    Comment: Performed at [facility], [address], [place], [place] [address]  Urinalysis, Routine w reflex microscopic Urine, Clean Catch     Status: Abnormal   Collection Time: 04/26/22  7:50 PM  Result Value Ref Range   Color, Urine COLORLESS (A) YELLOW   APPearance CLEAR CLEAR   Specific Gravity, Urine  1.007 1.005 - 1.030   pH 6.5 5.0 - 8.0   Glucose, UA >1,000 (A) NEGATIVE mg/dL   Hgb urine dipstick NEGATIVE NEGATIVE   Bilirubin Urine NEGATIVE NEGATIVE   Ketones, ur NEGATIVE NEGATIVE mg/dL   Protein, ur NEGATIVE NEGATIVE mg/dL   Nitrite NEGATIVE NEGATIVE   Leukocytes,Ua NEGATIVE NEGATIVE   RBC / HPF 0-5 0 - 5 RBC/hpf   WBC, UA 0-5 0 - 5 WBC/hpf    Squamous Epithelial / LPF 0-5 0 - 5    Comment: Performed at [facility], [address] [place], [place] [address]  Hepatic function panel     Status: None   Collection Time: 04/26/22  7:50 PM  Result Value Ref Range   Total Protein 7.3 6.5 - 8.1 g/dL   Albumin 4.3 3.5 - 5.0 g/dL   AST 24 15 - 41 U/L   ALT 16 0 - 44 U/L   Alkaline Phosphatase 60 38 - 126 U/L   Total Bilirubin 0.7 0.3 - 1.2 mg/dL   Bilirubin, Direct 0.1 0.0 - 0.2 mg/dL   Indirect Bilirubin 0.6 0.3 - 0.9 mg/dL    Comment: Performed at [facility], [place], [place] [address]  SARS Coronavirus 2 by RT PCR (hospital order, performed in [facility] hospital lab) *cepheid single result test* Anterior Nasal Swab     Status: None   Collection Time: 04/26/22  9:29 PM   Specimen: Anterior Nasal Swab  Result Value Ref Range   SARS Coronavirus 2 by RT PCR NEGATIVE NEGATIVE    Comment: (NOTE) SARS-CoV-2 target nucleic acids are NOT DETECTED.  The SARS-CoV-2 RNA is generally detectable in upper and lower respiratory specimens during the acute phase of infection. The lowest concentration of SARS-CoV-2 viral copies this assay can detect is 250 copies / mL. A negative result does not preclude SARS-CoV-2 infection and should not be used as the sole basis for treatment or other patient management decisions.  A negative result may occur with improper specimen collection / handling, submission of specimen other than nasopharyngeal swab, presence of viral mutation(s) within the areas targeted by this assay, and inadequate number of viral copies (<250 copies / mL). A negative result must be combined with clinical observations, patient history, and epidemiological information.  Fact Sheet for Patients:   https://www.patel.info/  Fact Sheet for Healthcare Providers: https://hall.com/  This test is not yet approved or  cleared by the  [place] FDA and has been authorized for detection and/or diagnosis of SARS-CoV-2 by FDA under an Emergency Use Authorization (EUA).  This EUA will remain in effect (meaning this test can be used) for the duration of the COVID-19 declaration under Section 564(b)(1) of the Act, 21 U.S.C. section 360bbb-3(b)(1), unless the authorization is terminated or revoked sooner.  Performed at [facility], [address], [place], [place] [address]   CBC     Status: None   Collection Time: 06/11/22  3:16 PM  Result Value Ref Range   WBC 6.4 4.0 - 10.5 K/uL   RBC 5.10 3.87 - 5.11 Mil/uL   Platelets 290.0 150.0 - 400.0 K/uL   Hemoglobin 14.0 12.0 - 15.0 g/dL   HCT 42.4 36.0 - 46.0 %   MCV 83.2 78.0 - 100.0 fl   MCHC 33.0 30.0 - 36.0 g/dL   RDW 13.3 11.5 - 15.5 %  Comp Met (CMET)     Status: Abnormal   Collection Time: 06/11/22  3:16 PM  Result Value Ref  Range   Sodium 136 135 - 145 mEq/L   Potassium 3.6 3.5 - 5.1 mEq/L   Chloride 99 96 - 112 mEq/L   CO2 28 19 - 32 mEq/L   Glucose, Bld 248 (H) 70 - 99 mg/dL   BUN 10 6 - 23 mg/dL   Creatinine, Ser 0.82 0.40 - 1.20 mg/dL   Total Bilirubin 0.7 0.2 - 1.2 mg/dL   Alkaline Phosphatase 64 39 - 117 U/L   AST 27 0 - 37 U/L   ALT 22 0 - 35 U/L   Total Protein 7.3 6.0 - 8.3 g/dL   Albumin 4.6 3.5 - 5.2 g/dL   GFR 85.31 >60.00 mL/min    Comment: Calculated using the CKD-EPI Creatinine Equation (2021)   Calcium 10.0 8.4 - 10.5 mg/dL  HgB A1c     Status: Abnormal   Collection Time: 06/11/22  3:16 PM  Result Value Ref Range   Hgb A1c MFr Bld 11.3 (H) 4.6 - 6.5 %    Comment: Glycemic Control Guidelines for People with Diabetes:Non Diabetic:  <6%Goal of Therapy: <7%Additional Action Suggested:  >8%   TSH     Status: None   Collection Time: 06/11/22  3:16 PM  Result Value Ref Range   TSH 2.54 0.35 - 5.50 uIU/mL  T4, free     Status: None   Collection Time: 06/11/22  3:16 PM  Result Value Ref Range   Free T4 1.08 0.60 -  1.60 ng/dL    Comment: Specimens from patients who are undergoing biotin therapy and /or ingesting biotin supplements may contain high levels of biotin.  The higher biotin concentration in these specimens interferes with this Free T4 assay.  Specimens that contain high levels  of biotin may cause false high results for this Free T4 assay.  Please interpret results in light of the total clinical presentation of the patient.    Microalbumin / creatinine urine ratio     Status: None   Collection Time: 06/11/22  3:18 PM  Result Value Ref Range   Microalb, Ur <0.7 0.0 - 1.9 mg/dL   Creatinine,U 32.9 mg/dL   Microalb Creat Ratio 2.1 0.0 - 30.0 mg/g  POCT Urinalysis Dipstick (Automated)     Status: Abnormal   Collection Time: 06/11/22  3:39 PM  Result Value Ref Range   Color, UA yellow    Clarity, UA clear    Glucose, UA Negative Negative   Bilirubin, UA Negative    Ketones, UA Negative    Spec Grav, UA 1.010 1.010 - 1.025   Blood, UA Negative    pH, UA 6.0 5.0 - 8.0   Protein, UA Negative Negative   Urobilinogen, UA 0.2 0.2 or 1.0 E.U./dL   Nitrite, UA Negative    Leukocytes, UA Trace (A) Negative    

## 2022-06-25 ENCOUNTER — Encounter: Payer: Self-pay | Admitting: Internal Medicine

## 2022-06-25 ENCOUNTER — Ambulatory Visit (INDEPENDENT_AMBULATORY_CARE_PROVIDER_SITE_OTHER): Payer: Medicare Other | Admitting: Internal Medicine

## 2022-06-25 VITALS — BP 109/67 | HR 81 | Temp 98.7°F | Resp 12 | Ht 62.0 in | Wt 100.4 lb

## 2022-06-25 DIAGNOSIS — E1065 Type 1 diabetes mellitus with hyperglycemia: Secondary | ICD-10-CM | POA: Diagnosis not present

## 2022-06-25 DIAGNOSIS — F319 Bipolar disorder, unspecified: Secondary | ICD-10-CM | POA: Diagnosis not present

## 2022-06-25 DIAGNOSIS — F32A Depression, unspecified: Secondary | ICD-10-CM

## 2022-06-25 DIAGNOSIS — E109 Type 1 diabetes mellitus without complications: Secondary | ICD-10-CM

## 2022-06-25 DIAGNOSIS — E039 Hypothyroidism, unspecified: Secondary | ICD-10-CM

## 2022-06-25 MED ORDER — DEXCOM G6 RECEIVER DEVI: 0 refills | Status: DC

## 2022-06-25 MED ORDER — DEXCOM G6 TRANSMITTER MISC: 3 refills | Status: DC

## 2022-06-25 MED ORDER — OMNIPOD 5 DEXG7G6 INTRO GEN 5 KIT: 0.8000 [IU]/h | PACK | 11 refills | Status: AC

## 2022-06-25 MED ORDER — ONDANSETRON 4 MG PO TBDP: 4.0000 mg | ORAL_TABLET | Freq: Three times a day (TID) | ORAL | 0 refills | Status: DC | PRN

## 2022-06-25 MED ORDER — LANTUS SOLOSTAR 100 UNIT/ML ~~LOC~~ SOPN: 20.0000 [IU] | PEN_INJECTOR | Freq: Every morning | SUBCUTANEOUS | 11 refills | Status: DC

## 2022-06-25 MED ORDER — DEXCOM G6 SENSOR MISC: 11 refills | Status: DC

## 2022-06-25 NOTE — Assessment & Plan Note (Signed)
  Mrs. Berringer returns and says she is feeling better but unfortunately the Dexcom came out of her arm and she was never able to get it to be compatible with her phone or home tablet so we do not have the data from it.  Had sent Dexcom G7 sensors with receiver so that we could overcome this limitation last week to [facility] but she has not heard yet from them  therefore I sent again to [place] [facility] to see if we can get traction there.  I talked her about it and she has been doing some research and she was interested in [place] and we discovered that [place] 5 has the ability to connect to the [place] and so we are going to reorder as a G6 through a separate company and hopefully will get that instead of the [place] with an Omni pod will better enable [place] to manage her very brittle diabetes type 1  So the plan today is working to try to get an OmniPod 5 and get it sorted out with a Dexcom G6 combo treatment to solve the brittle diabetes problem ultimately but in the meantime while we are getting that process through insurance and resubmitting that to [facility] or going to try to get Lantus to replace her NPH which has been very inconvenient for her to use and honestly because it has to be done twice a day is making it difficult to get consistent times and consistent levels

## 2022-06-25 NOTE — Progress Notes (Signed)
[place] at [employer]:  [phone]   Routine Medical Office Visit  Patient:  Karen Hayden      Age: 46 y.o.       Sex:  female  Date:   06/25/2022  PCP:    Loralee Pacas, [place] Provider: Loralee Pacas, MD  Assessment/Plan:     Karen Hayden was seen today for one week folow-up.  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 Not well controlled due to depression and cost of medicine/cgm making mgmt difficult, also history of severe lows.  Assessment & Plan:  Karen Hayden returns and says she is feeling better but unfortunately the Dexcom came out of her arm and she was never able to get it to be compatible with her phone or home tablet so we do not have the data from it.  Had sent Dexcom G7 sensors with receiver so that we could overcome this limitation last week to [facility] but she has not heard yet from them  therefore I sent again to [place] [facility] to see if we can get traction there.  I talked her about it and she has been doing some research and she was interested in [place] and we discovered that [place] 5 has the ability to connect to the [place] and so we are going to reorder as a G6 through a separate company and hopefully will get that instead of the [place] with an Omni pod will better enable [place] to manage her very brittle diabetes type 1  So the plan today is working to try to get an OmniPod 5 and get it sorted out with a Dexcom G6 combo treatment to solve the brittle diabetes problem ultimately but in the meantime while we are getting that process through insurance and resubmitting that to [facility] or going to try to get Lantus to replace her NPH which has been very inconvenient for her to use and honestly because it has to be done twice a day is making it difficult to get consistent times and consistent levels  Orders: -     Omnipod 5 G6 Intro (Gen 5); 0.8 Units/hr by Does not apply route continuous. Using OmniPod  along with Dexcom G6 to manage diabetes type 1 brittle please see Dr. For assistance with setting up but initial settings are planned to be 0.8 units/h continuous and 10-1 bolus ratio  Dispense: 2 kit; Refill: 11 -     Dexcom G6 Sensor; New sensor every 10 days  Dispense: 3 each; Refill: 11 -     Dexcom G6 Transmitter; Please provide once every 3 months  Dispense: 1 each; Refill: 3 -     Dexcom G6 Receiver; Please provide 1 receiver  Dispense: 1 each; Refill: 0 -     Ondansetron; Take 1 tablet (4 mg total) by mouth every 8 (eight) hours as needed for nausea or vomiting (for nausea from wegovy or other source).  Dispense: 20 tablet; Refill: 0  Depressive disorder Overview: She has history of bipolar 1 and depression and does really well on Zyprexa which was recently restarted and she reports it is doing great. We have a pending behavioral health and psych referral and they both call to arrange and I encouraged her to follow through with that as well as doing motivational therapy with either cognitive behavioral therapy solution focused therapy or really any kind of therapy using [place] Seems to be associated primarily with  grief   Assessment & Plan: I offered to adjust Zyprexa or maybe other medications but it seems like Zyprexa alone is really doing a great job on the mood and mood at present so I just encouraged her to keep taking it and not try to suddenly stop if we do want to cut back to do it slowly and taper off little by little and keep me in the loop    Bipolar 1 disorder ([place])  Acquired hypothyroidism Overview: Had radiation in May 2020 for hyperthyroidism at that time has been hypothyroid since Cause of Hypo/hyperthyroidism not known to patient   Type 1 diabetes mellitus with hyperglycemia ([place]) Overview:     Other orders -     Lantus SoloStar; Inject 20 Units into the skin in the morning. Replaces insulin NPH.  Dispense: 15 mL; Refill: 11     Return in about 3 weeks  (around 07/16/2022) for hopefully omnipod setup, but dm mgmt if not..   Today's key discussion points - also in After Visit Summary (AVS) Common side effects, risks, benefits, and alternatives for medications and treatment plan prescribed today were discussed, and she expressed understanding of the given instructions.  Medication list was reconciled and patient instructions and summary information was documented and made available for her to review in the AVS (see AVS).  This note is also available to patient for review for accuracy and understanding. She was encouraged to contact our office by phone or message via MyChart if she has any questions or concerns regarding our treatment plan (see AVS).  No barriers to understanding were identified We discussed red flag symptoms and signs in detail and when to call the office or go to ER if her condition worsens (see AFTER VISIT SUMMARY).. She expressed understanding.    Subjective:   Karen Hayden is a 46 y.o. 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[place]) 06/18/2022   Chronic kidney infection    Coronary artery spasm (HCC)    Depressive disorder 06/11/2022   Was on Zyprexa in the past which helps her depression and her mood and her weight gain needs but to her knowledge does not have a diagnosis of schizophrenia or bipolar and denies any history of psychotic symptoms   Diabetes mellitus type I ([place])    Diabetic gastroparesis ([place])    DM (diabetes mellitus) ([place])    Gastroparesis    Due to diabetes    Hyperlipidemia    Hypertension    Hypothyroidism    Has thyroiditis with subsequent hyperthyroidism so off Synthroid   Seizure disorder ([place])    Umbilical hernia      She is presenting today with: Chief Complaint  Patient presents with   One week folow-up    No complaints, blood sugar could be better.     Additional physician collected  history: See Assessment/Plan section for per problem updates to history (overview and a/p subsections) as reported by patient today. Reports that she is just guessing not using a very systematic way of giving herself insulin and reports that she has not had any lows this week but she has had highs she gives me some numbers like 428, [id number] She was unable to get any numbers off the Dexcom due to hardware compatibility problems Says her mood is better this week has been taking her 5 mg of Zyprexa at bedtime when asked  if she is having crying spells or anxiety breakdowns or panic attack she denies any of this also taking the levothyroxine 100 mcg daily consistently When I asked her to sort of roughly describe her known systematic insulin administration she says she takes insulin NPH Novolin as 14 in the morning and 7 at night rather than the 16 that is prescribed twice daily due to she has been adjusting up but she is still afraid of dropping too low again For her Novolin R supposed to be done a grams of carbs per 1 unit but she just guesses that it        Objective:  Physical Exam: BP 109/67 (BP Location: Right Arm, Patient Position: Sitting)   Pulse 81   Temp 98.7 F (37.1 C) (Temporal)   Resp 12   Ht '5\' 2"'  (1.575 m)   Wt 100 lb 6.4 oz (45.5 kg)   SpO2 98%   BMI 18.36 kg/m   She is a polite, friendly, and genuine person Constitutional: NAD, AAO, not ill-appearing  Neuro: alert, no focal deficit obvious, articulate speech Psych: normal mood, behavior, thought content   Problem specific physical exam findings:     No images are attached to the encounter or orders placed in the encounter.    Results:  No results found for any visits on 06/25/22.   Recent Results (from the past 2160 hour(s))  CBG monitoring, ED     Status: Abnormal   Collection Time: 04/26/22  7:37 PM  Result Value Ref Range   Glucose-Capillary 397 (H) 70 - 99 mg/dL    Comment: Glucose reference range applies  only to samples taken after fasting for at least 8 hours.  Basic metabolic panel     Status: Abnormal   Collection Time: 04/26/22  7:50 PM  Result Value Ref Range   Sodium 135 135 - 145 mmol/L   Potassium 4.0 3.5 - 5.1 mmol/L   Chloride 98 98 - 111 mmol/L   CO2 28 22 - 32 mmol/L   Glucose, Bld 420 (H) 70 - 99 mg/dL    Comment: Glucose reference range applies only to samples taken after fasting for at least 8 hours.   BUN 14 6 - 20 mg/dL   Creatinine, Ser 0.91 0.44 - 1.00 mg/dL   Calcium 10.1 8.9 - 10.3 mg/dL   GFR, Estimated >60 >60 mL/min    Comment: (NOTE) Calculated using the CKD-EPI Creatinine Equation (2021)    Anion gap 9 5 - 15    Comment: Performed at [facility], [address], [place], [place] [address]  CBC     Status: Abnormal   Collection Time: 04/26/22  7:50 PM  Result Value Ref Range   WBC 6.0 4.0 - 10.5 K/uL   RBC 5.29 (H) 3.87 - 5.11 MIL/uL   Hemoglobin 14.5 12.0 - 15.0 g/dL   HCT 43.9 36.0 - 46.0 %   MCV 83.0 80.0 - 100.0 fL   MCH 27.4 26.0 - 34.0 pg   MCHC 33.0 30.0 - 36.0 g/dL   RDW 12.8 11.5 - 15.5 %   Platelets 349 150 - 400 K/uL   nRBC 0.0 0.0 - 0.2 %    Comment: Performed at [facility], [address], [place], [place] [address]  Urinalysis, Routine w reflex microscopic Urine, Clean Catch     Status: Abnormal   Collection Time: 04/26/22  7:50 PM  Result Value Ref Range   Color, Urine COLORLESS (A) YELLOW   APPearance  CLEAR CLEAR   Specific Gravity, Urine 1.007 1.005 - 1.030   pH 6.5 5.0 - 8.0   Glucose, UA >1,000 (A) NEGATIVE mg/dL   Hgb urine dipstick NEGATIVE NEGATIVE   Bilirubin Urine NEGATIVE NEGATIVE   Ketones, ur NEGATIVE NEGATIVE mg/dL   Protein, ur NEGATIVE NEGATIVE mg/dL   Nitrite NEGATIVE NEGATIVE   Leukocytes,Ua NEGATIVE NEGATIVE   RBC / HPF 0-5 0 - 5 RBC/hpf   WBC, UA 0-5 0 - 5 WBC/hpf   Squamous Epithelial / LPF 0-5 0 - 5    Comment: Performed at [facility], [address] [place], [place] [address]  Hepatic function panel     Status: None   Collection Time: 04/26/22  7:50 PM  Result Value Ref Range   Total Protein 7.3 6.5 - 8.1 g/dL   Albumin 4.3 3.5 - 5.0 g/dL   AST 24 15 - 41 U/L   ALT 16 0 - 44 U/L   Alkaline Phosphatase 60 38 - 126 U/L   Total Bilirubin 0.7 0.3 - 1.2 mg/dL   Bilirubin, Direct 0.1 0.0 - 0.2 mg/dL   Indirect Bilirubin 0.6 0.3 - 0.9 mg/dL    Comment: Performed at [facility], [place], [place] [address]  SARS Coronavirus 2 by RT PCR (hospital order, performed in [facility] hospital lab) *cepheid single result test* Anterior Nasal Swab     Status: None   Collection Time: 04/26/22  9:29 PM   Specimen: Anterior Nasal Swab  Result Value Ref Range   SARS Coronavirus 2 by RT PCR NEGATIVE NEGATIVE    Comment: (NOTE) SARS-CoV-2 target nucleic acids are NOT DETECTED.  The SARS-CoV-2 RNA is generally detectable in upper and lower respiratory specimens during the acute phase of infection. The lowest concentration of SARS-CoV-2 viral copies this assay can detect is 250 copies / mL. A negative result does not preclude SARS-CoV-2 infection and should not be used as the sole basis for treatment or other patient management decisions.  A negative result may occur with improper specimen collection / handling, submission of specimen other than nasopharyngeal swab, presence of viral mutation(s) within the areas targeted by this assay, and inadequate number of viral copies (<250 copies / mL). A negative result must be combined with clinical observations, patient history, and epidemiological information.  Fact Sheet for Patients:   https://www.patel.info/  Fact Sheet for Healthcare Providers: https://hall.com/  This test is not yet approved or  cleared by the [place] FDA and has been authorized for detection and/or diagnosis of SARS-CoV-2 by FDA  under an Emergency Use Authorization (EUA).  This EUA will remain in effect (meaning this test can be used) for the duration of the COVID-19 declaration under Section 564(b)(1) of the Act, 21 U.S.C. section 360bbb-3(b)(1), unless the authorization is terminated or revoked sooner.  Performed at [facility], [address] [place], [place] [address]   CBC     Status: None   Collection Time: 06/11/22  3:16 PM  Result Value Ref Range   WBC 6.4 4.0 - 10.5 K/uL   RBC 5.10 3.87 - 5.11 Mil/uL   Platelets 290.0 150.0 - 400.0 K/uL   Hemoglobin 14.0 12.0 - 15.0 g/dL   HCT 42.4 36.0 - 46.0 %   MCV 83.2 78.0 - 100.0 fl   MCHC 33.0 30.0 - 36.0 g/dL   RDW 13.3 11.5 - 15.5 %  Comp Met (CMET)     Status: Abnormal   Collection Time: 06/11/22  3:16 PM  Result Value Ref Range   Sodium 136 135 - 145 mEq/L   Potassium 3.6 3.5 - 5.1 mEq/L   Chloride 99 96 - 112 mEq/L   CO2 28 19 - 32 mEq/L   Glucose, Bld 248 (H) 70 - 99 mg/dL   BUN 10 6 - 23 mg/dL   Creatinine, Ser 0.82 0.40 - 1.20 mg/dL   Total Bilirubin 0.7 0.2 - 1.2 mg/dL   Alkaline Phosphatase 64 39 - 117 U/L   AST 27 0 - 37 U/L   ALT 22 0 - 35 U/L   Total Protein 7.3 6.0 - 8.3 g/dL   Albumin 4.6 3.5 - 5.2 g/dL   GFR 85.31 >60.00 mL/min    Comment: Calculated using the CKD-EPI Creatinine Equation (2021)   Calcium 10.0 8.4 - 10.5 mg/dL  HgB A1c     Status: Abnormal   Collection Time: 06/11/22  3:16 PM  Result Value Ref Range   Hgb A1c MFr Bld 11.3 (H) 4.6 - 6.5 %    Comment: Glycemic Control Guidelines for People with Diabetes:Non Diabetic:  <6%Goal of Therapy: <7%Additional Action Suggested:  >8%   TSH     Status: None   Collection Time: 06/11/22  3:16 PM  Result Value Ref Range   TSH 2.54 0.35 - 5.50 uIU/mL  T4, free     Status: None   Collection Time: 06/11/22  3:16 PM  Result Value Ref Range   Free T4 1.08 0.60 - 1.60 ng/dL    Comment: Specimens from patients who are undergoing biotin therapy and /or  ingesting biotin supplements may contain high levels of biotin.  The higher biotin concentration in these specimens interferes with this Free T4 assay.  Specimens that contain high levels  of biotin may cause false high results for this Free T4 assay.  Please interpret results in light of the total clinical presentation of the patient.    Microalbumin / creatinine urine ratio     Status: None   Collection Time: 06/11/22  3:18 PM  Result Value Ref Range   Microalb, Ur <0.7 0.0 - 1.9 mg/dL   Creatinine,U 32.9 mg/dL   Microalb Creat Ratio 2.1 0.0 - 30.0 mg/g  POCT Urinalysis Dipstick (Automated)     Status: Abnormal   Collection Time: 06/11/22  3:39 PM  Result Value Ref Range   Color, UA yellow    Clarity, UA clear    Glucose, UA Negative Negative   Bilirubin, UA Negative    Ketones, UA Negative    Spec Grav, UA 1.010 1.010 - 1.025   Blood, UA Negative    pH, UA 6.0 5.0 - 8.0   Protein, UA Negative Negative   Urobilinogen, UA 0.2 0.2 or 1.0 E.U./dL   Nitrite, UA Negative    Leukocytes, UA Trace (A) Negative         No images are attached to the encounter.

## 2022-06-25 NOTE — Patient Instructions (Addendum)
It was a pleasure seeing you today!  Today the plan is...  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  Had sent Dexcom G7 sensors with receiver so that we could overcome this limitation last week to [facility] but she has not heard yet from them  therefore I sent again to [place] [facility] to see if we can get traction there.  I talked her about it and she has been doing some research and she was interested in [place] and we discovered that [place] 5 has the ability to connect to the [place] and so we are going to reorder as a G6 through a separate company and hopefully will get that instead of the [place] with an Omni pod will better enable [place] to manage her very brittle diabetes type 1  So the plan today is working to try to get an OmniPod 5 and get it sorted out with a Dexcom G6 combo treatment to solve the brittle diabetes problem ultimately but in the meantime while we are getting that process through insurance and resubmitting that to [facility] or going to try to get Lantus to replace her NPH which has been very inconvenient for her to use and honestly because it has to be done twice a day is making it difficult to get consistent times and consistent levels  Orders: -     Omnipod 5 G6 Intro (Gen 5); 0.8 Units/hr by Does not apply route continuous. Using OmniPod along with Dexcom G6 to manage diabetes type 1 brittle please see Dr. For assistance with setting up but initial settings are planned to be 0.8 units/h continuous and 10-1 bolus ratio  Dispense: 2 kit; Refill: 11 -     Dexcom G6 Sensor; New sensor every 10 days  Dispense: 3 each; Refill: 11 -     Dexcom G6 Transmitter; Please provide once every 3 months  Dispense: 1 each; Refill: 3 -     Dexcom G6 Receiver; Please provide 1  receiver  Dispense: 1 each; Refill: 0 -     Ondansetron; Take 1 tablet (4 mg total) by mouth every 8 (eight) hours as needed for nausea or vomiting (for nausea from wegovy or other source).  Dispense: 20 tablet; Refill: 0  Depressive disorder Assessment & Plan: I offered to adjust Zyprexa or maybe other medications but it seems like Zyprexa alone is really doing a great job on the mood and mood at present so I just encouraged her to keep taking it and not try to suddenly stop if we do want to cut back to do it slowly and taper off little by little and keep me in the loop    Bipolar 1 disorder ([place])  Acquired hypothyroidism  Type 1 diabetes mellitus with hyperglycemia ([place])  Other orders -     Lantus SoloStar; Inject 20 Units into the skin in the morning. Replaces insulin NPH.  Dispense: 15 mL; Refill: 11     Loralee Pacas, MD   Return in about 3 weeks (around 07/16/2022) for hopefully omnipod setup, but dm mgmt if not..  If you are not doing as well as expected, call and return to the office sooner If your condition begins to worsen or become severe:  go to the ER If you have follow-up questions / concerns: please contact me via phone [phone] or MyChart messaging Please bring all your medicines to your next appointment. This is the best way for me to know exactly what you're taking.    IF you received an x-ray today, you will receive an invoice from [facility]. Please contact [facility] at [phone] with questions or concerns regarding your invoice.    IF you received labwork today, you will receive an invoice from [facility]. Please contact [facility] at [phone] with questions or concerns regarding your invoice.    Our billing staff will not be able to assist you with questions regarding bills from these companies.   --------------------------------------------------------------------------------------------------------------------  You will be  contacted with the lab results as soon as they are available. The fastest way to get your results is to activate your My Chart account. Instructions are located on the last page of this paperwork. If you have not heard from [place] regarding the results in 2 weeks, please contact this office. For any labs or imaging tests, we will call you if the results are significantly abnormal.  Most normal results will be posted to myChart as soon as they are available and I will comment on them there within 2-3 business days.

## 2022-06-25 NOTE — Assessment & Plan Note (Addendum)
I offered to adjust Zyprexa or maybe other medications but it seems like Zyprexa alone is really doing a great job on the mood and mood at present so I just encouraged her to keep taking it and not try to suddenly stop if we do want to cut back to do it slowly and taper off little by little and keep me in the loop

## 2022-07-15 ENCOUNTER — Encounter: Payer: Self-pay | Admitting: Internal Medicine

## 2022-07-15 ENCOUNTER — Other Ambulatory Visit: Payer: Self-pay

## 2022-07-15 ENCOUNTER — Ambulatory Visit (INDEPENDENT_AMBULATORY_CARE_PROVIDER_SITE_OTHER): Payer: Medicare Other | Admitting: Internal Medicine

## 2022-07-15 ENCOUNTER — Encounter (HOSPITAL_BASED_OUTPATIENT_CLINIC_OR_DEPARTMENT_OTHER): Payer: Self-pay

## 2022-07-15 ENCOUNTER — Emergency Department (HOSPITAL_BASED_OUTPATIENT_CLINIC_OR_DEPARTMENT_OTHER)
Admission: EM | Admit: 2022-07-15 | Discharge: 2022-07-15 | Disposition: A | Discharge status: Home / Self Care | Payer: Medicare Other | Attending: Emergency Medicine | Admitting: Emergency Medicine

## 2022-07-15 VITALS — BP 130/83 | HR 69 | Temp 98.3°F | Resp 14 | Ht 62.0 in | Wt 100.6 lb

## 2022-07-15 DIAGNOSIS — M7918 Myalgia, other site: Secondary | ICD-10-CM | POA: Insufficient documentation

## 2022-07-15 DIAGNOSIS — R1084 Generalized abdominal pain: Secondary | ICD-10-CM | POA: Insufficient documentation

## 2022-07-15 DIAGNOSIS — R748 Abnormal levels of other serum enzymes: Secondary | ICD-10-CM | POA: Diagnosis not present

## 2022-07-15 DIAGNOSIS — R531 Weakness: Secondary | ICD-10-CM | POA: Insufficient documentation

## 2022-07-15 DIAGNOSIS — R634 Abnormal weight loss: Secondary | ICD-10-CM | POA: Diagnosis not present

## 2022-07-15 DIAGNOSIS — Z794 Long term (current) use of insulin: Secondary | ICD-10-CM | POA: Insufficient documentation

## 2022-07-15 DIAGNOSIS — R6883 Chills (without fever): Secondary | ICD-10-CM | POA: Diagnosis not present

## 2022-07-15 DIAGNOSIS — R109 Unspecified abdominal pain: Secondary | ICD-10-CM | POA: Diagnosis not present

## 2022-07-15 DIAGNOSIS — R6889 Other general symptoms and signs: Secondary | ICD-10-CM | POA: Diagnosis not present

## 2022-07-15 DIAGNOSIS — R519 Headache, unspecified: Secondary | ICD-10-CM | POA: Diagnosis not present

## 2022-07-15 DIAGNOSIS — E1165 Type 2 diabetes mellitus with hyperglycemia: Secondary | ICD-10-CM | POA: Diagnosis not present

## 2022-07-15 DIAGNOSIS — R509 Fever, unspecified: Secondary | ICD-10-CM | POA: Diagnosis not present

## 2022-07-15 DIAGNOSIS — G8929 Other chronic pain: Secondary | ICD-10-CM

## 2022-07-15 DIAGNOSIS — M791 Myalgia, unspecified site: Secondary | ICD-10-CM

## 2022-07-15 LAB — COMPREHENSIVE METABOLIC PANEL
ALT: 27 U/L (ref 0–44)
AST: 20 U/L (ref 15–41)
Albumin: 4.3 g/dL (ref 3.5–5.0)
Alkaline Phosphatase: 70 U/L (ref 38–126)
Anion gap: 9 (ref 5–15)
BUN: 18 mg/dL (ref 6–20)
CO2: 30 mmol/L (ref 22–32)
Calcium: 10 mg/dL (ref 8.9–10.3)
Chloride: 101 mmol/L (ref 98–111)
Creatinine, Ser: 0.78 mg/dL (ref 0.44–1.00)
GFR, Estimated: >60 mL/min (ref >60)
Glucose, Bld: 122 mg/dL — ABNORMAL HIGH (ref 70–99)
Potassium: 3.9 mmol/L (ref 3.5–5.1)
Sodium: 140 mmol/L (ref 135–145)
Total Bilirubin: 0.4 mg/dL (ref 0.3–1.2)
Total Protein: 7 g/dL (ref 6.5–8.1)

## 2022-07-15 LAB — URINALYSIS, ROUTINE W REFLEX MICROSCOPIC
Bilirubin Urine: NEGATIVE
Glucose, UA: NEGATIVE mg/dL
Hgb urine dipstick: NEGATIVE
Ketones, ur: NEGATIVE mg/dL
Leukocytes,Ua: NEGATIVE
Nitrite: NEGATIVE
Protein, ur: NEGATIVE mg/dL
Specific Gravity, Urine: 1.014 (ref 1.005–1.030)
pH: 7.5 (ref 5.0–8.0)

## 2022-07-15 LAB — CBG MONITORING, ED
Glucose-Capillary: 108 mg/dL — ABNORMAL HIGH (ref 70–99)
Glucose-Capillary: 81 mg/dL (ref 70–99)

## 2022-07-15 LAB — CBC
HCT: 41.1 % (ref 36.0–46.0)
Hemoglobin: 13.8 g/dL (ref 12.0–15.0)
MCH: 27.3 pg (ref 26.0–34.0)
MCHC: 33.6 g/dL (ref 30.0–36.0)
MCV: 81.4 fL (ref 80.0–100.0)
Platelets: 350 10*3/uL (ref 150–400)
RBC: 5.05 MIL/uL (ref 3.87–5.11)
RDW: 13.2 % (ref 11.5–15.5)
WBC: 7.6 10*3/uL (ref 4.0–10.5)
nRBC: 0 % (ref 0.0–0.2)

## 2022-07-15 LAB — POC COVID19 BINAXNOW: SARS Coronavirus 2 Ag: NEGATIVE

## 2022-07-15 LAB — LIPASE, BLOOD: Lipase: 55 U/L — ABNORMAL HIGH (ref 11–51)

## 2022-07-15 LAB — POCT INFLUENZA A/B
Influenza A, POC: NEGATIVE
Influenza B, POC: NEGATIVE

## 2022-07-15 MED ORDER — KETOROLAC TROMETHAMINE 15 MG/ML IJ SOLN: 15.0000 mg | Freq: Once | INTRAMUSCULAR | Status: DC

## 2022-07-15 MED ORDER — SODIUM CHLORIDE 0.9 % IV BOLUS: 1000.0000 mL | Freq: Once | INTRAVENOUS | Status: DC

## 2022-07-15 MED ORDER — PROCHLORPERAZINE EDISYLATE 10 MG/2ML IJ SOLN: 10.0000 mg | Freq: Once | INTRAMUSCULAR | Status: DC

## 2022-07-15 NOTE — ED Triage Notes (Signed)
Pt reports for the past couple of weeks she have been experiencing weakness, fevers, chills, body aches, abdominal pain, confusion, weight loss, and nausea. Reports she also had a seizure a couple weeks ago as well. Pt is AxOx4.

## 2022-07-15 NOTE — Patient Instructions (Addendum)
It was a pleasure seeing you today! I truly hope you feel like you received 5 star service and please let me know if there is anything I can improve.  Loralee Pacas, MD   Today the plan is...  I would like you to go to the emergency room and get a comprehensive work-up for very severe severe illness that I am concerned for possible sepsis due to your fevers chills and recent seizure.  Also your abdominal pain is very concerning when you have not had a bowel movement in 5 days and you are so ill feeling.  I think you may need imaging in the emergency room for this.  Also I think you need ruled out for diabetic ketoacidosis-even if you currently are not in DKA I think your very poorly controlled diabetes puts you at high risk for severe infections and that is due to the immunocompromising effect of the poor sugar control.  Your severe bone pain and fevers makes me suspicious for a bloodstream infection and so I suggest asking for blood cultures to be drawn and a full infection panel to be completed as well.  Because the headaches are so bad I think it might even be necessary to have a spinal tap but I would recommend discussing that with the emergency room doctor in more detail.  To the headaches seizures and diabetes I am also very concerned about possible meningitis since her reporting neck pain     '[x]'$  RETURN TO CLINIC: next week after ER for ER follow up, or even tomorrow.

## 2022-07-15 NOTE — ED Provider Notes (Signed)
[facility] EMERGENCY DEPT Provider Note   CSN: [id number] Arrival date & time: 07/15/22  1311     History  Chief Complaint  Patient presents with   Multiple Complaints    Karen Hayden is a 46 y.o. female presents to the ER with multiple complaints including weakness, fever, chills, myalgias, abdominal pain, nausea, weight loss for the past few weeks.  Patient was seen at PCP earlier today who sent her to ER for evaluation and to rule out sepsis or DKA.  Patient also reports she had a seizure a couple of weeks ago, but has not had one since.  She also reports fever at home.  Her main complaint is a headache that has been ongoing for the past couple of months as well as just feeling overall unwell.  Patient was seen for similar symptoms in August.       Home Medications Prior to Admission medications   Medication Sig Start Date End Date Taking? Authorizing Provider  Continuous Blood Gluc Receiver (DEXCOM G6 RECEIVER) DEVI Please provide 1 receiver 06/25/22   Loralee Pacas, MD  Continuous Blood Gluc Sensor (DEXCOM G6 SENSOR) MISC New sensor every 10 days 06/25/22   Loralee Pacas, MD  Continuous Blood Gluc Transmit (DEXCOM G6 TRANSMITTER) MISC Please provide once every 3 months 06/25/22   Loralee Pacas, MD  Glucagon (GVOKE HYPOPEN 2-PACK) 0.5 MG/0.1ML SOAJ Inject 0.5 mg into the skin daily as needed (for low blood sugar). 06/11/22   Loralee Pacas, MD  Glucagon 0.5 MG/0.1ML SOAJ Inject 0.5 mg into the skin daily. As needed (for low blood sugar). 06/15/22   Loralee Pacas, MD  Insulin Disposable Pump (OMNIPOD 5 G6 INTRO, GEN 5,) KIT 0.8 Units/hr by Does not apply route continuous. Using OmniPod along with Dexcom G6 to manage diabetes type 1 brittle please see Dr. For assistance with setting up but initial settings are planned to be 0.8 units/h continuous and 10-1 bolus ratio 06/25/22   Loralee Pacas, MD  insulin glargine (LANTUS SOLOSTAR) 100 UNIT/ML Solostar  Pen Inject 20 Units into the skin in the morning. Replaces insulin NPH. 06/25/22   Loralee Pacas, MD  insulin NPH Human (NOVOLIN N) 100 UNIT/ML injection Inject 16 Units into the skin 2 (two) times daily before a meal.     [provider]  insulin regular (NOVOLIN R) 100 units/mL injection Inject into the skin See admin instructions. 1 unit per 8 grams of carbs.at meals and to correct high blood sugar.    [provider]  INSULIN SYRINGE .5CC/28G (INS SYRINGE/NEEDLE .5CC/28G) 28G X 1/2" 0.5 ML MISC Use as instructed to administer insulin 05/10/17   Bonnielee Haff, MD  levothyroxine (SYNTHROID) 100 MCG tablet Take 1 tablet (100 mcg total) by mouth daily. 06/18/22   Loralee Pacas, MD  OLANZapine (ZYPREXA) 5 MG tablet Take 1 tablet (5 mg total) by mouth at bedtime. 06/11/22   Loralee Pacas, MD  ondansetron (ZOFRAN-ODT) 4 MG disintegrating tablet Take 1 tablet (4 mg total) by mouth every 8 (eight) hours as needed for nausea or vomiting (for nausea from wegovy or other source). 06/25/22   Loralee Pacas, MD      Allergies    Seroquel [quetiapine fumarate], Isosorbide nitrate, Lipitor [atorvastatin], Codeine, and Isosorbide    Review of Systems   Review of Systems  Constitutional:  Positive for appetite change, fatigue, fever and unexpected weight change.  Respiratory:  Negative for shortness of breath.  Cardiovascular:  Negative for chest pain.  Gastrointestinal:  Positive for abdominal pain, nausea and vomiting.  Genitourinary:  Negative for dysuria.  Musculoskeletal:  Positive for myalgias.  Neurological:  Positive for weakness.    Physical Exam Updated Vital Signs BP 129/72   Pulse 65   Temp 97.6 F (36.4 C)   Resp 18   Ht _0  (1.575 m)   Wt 45.5 kg   SpO2 100%   BMI 18.36 kg/m  Physical Exam Vitals and nursing note reviewed.  Constitutional:      General: She is not in acute distress.    Appearance: She is ill-appearing. She is not  toxic-appearing.     Comments: Chronically ill-appearing, in no acute distress, nontoxic  HENT:     Mouth/Throat:     Mouth: Mucous membranes are moist.     Pharynx: Oropharynx is clear.  Cardiovascular:     Rate and Rhythm: Normal rate and regular rhythm.     Pulses: Normal pulses.     Heart sounds: Normal heart sounds.  Pulmonary:     Effort: Pulmonary effort is normal. No respiratory distress.     Breath sounds: Normal breath sounds and air entry.  Abdominal:     General: Abdomen is flat. Bowel sounds are normal. There is no distension.     Palpations: Abdomen is soft.     Tenderness: There is no abdominal tenderness.  Musculoskeletal:     Right lower leg: No edema.     Left lower leg: No edema.  Skin:    General: Skin is warm and dry.     Capillary Refill: Capillary refill takes less than 2 seconds.     Coloration: Skin is pale.  Neurological:     General: No focal deficit present.     Mental Status: She is alert. Mental status is at baseline.     Sensory: Sensation is intact.     Motor: Motor function is intact. No weakness.  Psychiatric:        Mood and Affect: Mood and affect normal.        Behavior: Behavior normal.     ED Results / Procedures / Treatments   Labs (all labs ordered are listed, but only abnormal results are displayed) Labs Reviewed  LIPASE, BLOOD - Abnormal; Notable for the following components:      Result Value   Lipase 55 (*)    All other components within normal limits  COMPREHENSIVE METABOLIC PANEL - Abnormal; Notable for the following components:   Glucose, Bld 122 (*)    All other components within normal limits  CBG MONITORING, ED - Abnormal; Notable for the following components:   Glucose-Capillary 108 (*)    All other components within normal limits  CBC  URINALYSIS, ROUTINE W REFLEX MICROSCOPIC  CBG MONITORING, ED    EKG None  Radiology No results found.  Procedures Procedures    Medications Ordered in ED Medications -  No data to display   ED Course/ Medical Decision Making/ A&P                           Medical Decision Making Amount and/or Complexity of Data Reviewed Labs: ordered.  Risk Prescription drug management.  This patient presents to the ED with chief complaint(s) of headache, weight loss, nausea, vomiting, abdominal pain, weakness with pertinent past medical history of brittle diabetes, gastroparesis, renal failure.The complaint involves an extensive differential diagnosis and also carries  with it a high risk of complications and morbidity.    The differential diagnosis includes symptoms secondary to viral etiology, hyperglycemia, hypoglycemia, anemia, gastroenteritis, pancreatitis  The initial plan is to obtain baseline labs including CBC, CMP, lipase, and UA  Additional history obtained: Records reviewed Primary Care Documents  Initial Assessment:   On exam, patient is chronically ill-appearing, nontoxic, in no acute distress.  She expresses her biggest concern is her headache.  She also reports her blood sugar this morning was too high for her meter to read.  Heart rate and rhythm are normal.  Lung sounds clear to auscultation bilaterally.  Skin is warm and dry, she is pale.  Abdomen is soft and nontender on palpation, bowel sounds are normal.  Her vital signs are reassuring, she is not tachycardic or hypotensive.  Independent ECG/labs interpretation:  The following labs were independently interpreted:  CBC demonstrates no leukocytosis or anemia.  CMP only significant for mildly elevated glucose at 122, no electrolyte disturbance, kidney function is normal.  Lipase mildly elevated, low suspicion of pancreatitis based on patient presentation and exam.  UA is normal, no evidence of UTI, glucosuria, or proteinuria.  Independent visualization and interpretation of imaging: I do not feel that imaging is warranted at this time based on patient presentation and physical exam  findings.  Treatment and Reassessment: Plan to treat patient with IV fluids and migraine cocktail of Compazine and Toradol as her main concern is her headache.  Will also monitor patient's blood glucose while she is in the ED, considering she is a brittle diabetic and had very high blood sugar this morning.  Patient initially agreed with plan for IV fluids and migraine cocktail, however, I was informed by RN that patient would rather go home and do oral hydration and take OTC medications.  She has a follow-up appointment already scheduled with her PCP this week.  She states that she does not want to wait for IV fluids, and is concerned medications may make her drowsy and unable to drive.  Disposition:   Patient presents to ED with chronic complaints that have not improved.  Patient was evaluated at her PCP earlier today who advised her to proceed to ED for work-up and was concerned for possible sepsis and/or DKA.  Patient has reassuring laboratory work-up and vital signs.  She has no evidence of infectious process and no evidence of DKA.  Her blood sugars have remained normal, she did report starting to feel jittery while waiting discharge, recheck of blood sugar was 81.  Patient was given juice and a snack with improvement of symptoms.  Discussed with patient due to her symptoms being chronic, this may be related to her poorly controlled diabetes which requires long-term management by her PCP or endocrinology.  She has no urgent or emergent conditions that require intervention at this time.  Discussed supportive care measures at home including hydration and OTC medications for headaches.  Patient agrees with plan of discharge and PCP follow-up.  Return precautions were given.         Final Clinical Impression(s) / ED Diagnoses Final diagnoses:  Chronic nonintractable headache, unspecified headache type  Myalgia  Generalized abdominal pain    Rx / DC Orders ED Discharge Orders     None          Pat Kocher, [place] 07/15/22 2335    Audley Hose, MD 08/05/22 1113

## 2022-07-15 NOTE — ED Notes (Signed)
Patient expressed not wanting treatment and will drink fluids at home. Provider notified

## 2022-07-15 NOTE — Discharge Instructions (Addendum)
You were seen in the ER for multiple complaints and we did a laboratory work-up on you.  Your blood work was reassuring for no signs of acute infection, DKA, pancreatitis, or urinary changes that require emergent treatment.  I suspect you are having chronic ongoing problems with your diabetes which I know you are managing with your primary care provider.  Please keep your appointment with your PCP tomorrow to discuss your laboratory work-up.  I also recommend following up with neurology for your continued headache.  You may take 800 mg ibuprofen every 6-8 hours as needed.  It is important to maintain good hydration, I encourage you to drink water, Gatorade, electrolyte and continue to monitor your sugars at home.  Return to the ER if you develop new or worsening symptoms.

## 2022-07-15 NOTE — Progress Notes (Signed)
[facility] at [employer]:  [phone]   Routine Medical Office Visit  Patient:  Karen Hayden      Age: 46 y.o.       Sex:  female  Date:   07/15/2022  PCP:    Loralee Pacas, [place] Provider: Loralee Pacas, MD  Assessment/Plan:   Karen Hayden was seen today for blood sugar problem, not feeling well, night sweats, fever, chills, adenopathy, vision changes, severe lower back pain, brain fog, bloated, constipation, extreme fatigue, muscle weakness, weight loss, pale skin, chest pain, shortness of breath, nausea and headache.  Flu-like symptoms -     POCT Influenza A/B  Generalized abdominal pain  Chills -     POC COVID-19 BinaxNow -     POCT Influenza A/B  Fever, unspecified fever cause -     POC COVID-19 BinaxNow -     POCT Influenza A/B  Because you are also reported a seizure and neck pain I was concerned for meningitis also there is abdominal pain with not moving bowels is very severe in a diabetic I feel that she needs emergency room level evaluation despite normal vitals.  I don't think its NMS or serotonin syndrome due to her eyes are not dilated and BP is decent.  She reports high fever over 103 at home so my main concern is ruling out severe bacterial infection at the ER. COVID and flu tests were negative here.   Subjective:   Karen Hayden is a 46 y.o. 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   Chronic kidney infection    Coronary artery spasm ([facility])    Depressive disorder 06/11/2022   Was on Zyprexa in the past which helps her depression and her mood and her weight gain needs but to her knowledge does not have a diagnosis of schizophrenia or bipolar and denies any history of psychotic symptoms   Diabetes mellitus type I ([place])    Diabetic gastroparesis ([place])    DM (diabetes mellitus) ([place])    Gastroparesis    Due to  diabetes    Hyperlipidemia    Hypertension    Hypothyroidism    Has thyroiditis with subsequent hyperthyroidism so off Synthroid   Seizure disorder ([place])    Umbilical hernia      She is presenting today with: Chief Complaint  Patient presents with   Blood Sugar Problem    Elevated. In the 200's to upper 500's, with some normal readings and a few low readings mixed in.   Not feeling well    All symptoms since 9/1, feels horrible.Had a seizure two weeks ago.   Night Sweats   Fever   Chills   Adenopathy    On back of neck, groin, arm pits.   Vision changes   Severe lower back pain   Brain fog    Mental confusion.   Bloated    With stomach pain.   Constipation   Extreme fatigue   Muscle weakness   Weight loss   Pale skin   Chest pain   Shortness of Breath   Nausea    No vomiting.   Headache              Objective:  Physical Exam: BP 130/83 (BP Location: Left Arm, Patient Position: Sitting)   Pulse 69   Temp  98.3 F (36.8 C) (Temporal)   Resp 14   Ht _0  (1.575 m)   Wt 100 lb 9.6 oz (45.6 kg)   SpO2 100%   BMI 18.40 kg/m   She is a polite, friendly, and genuine person Constitutional: NAD, AAO, EXTREMELY ill-appearing  Neuro: alert, no focal deficit obvious, articulate speech Psych: normal mood, behavior, thought content  Problem-specific physical exam findings:  Weak, generalized pain, and focused generalized abdominal pain Breathing normally but hurts to move even a little Cries with the COVID /flu swabbing, minimal nasal congestion and no cough during visit.     Results Reviewed:  Results for orders placed or performed in visit on 07/15/22  POC COVID-19  Result Value Ref Range   SARS Coronavirus 2 Ag Negative Negative  POCT Influenza A/B  Result Value Ref Range   Influenza A, POC Negative Negative   Influenza B, POC Negative Negative     Recent Results (from the past 2160 hour(s))  CBG monitoring, ED     Status: Abnormal   Collection  Time: 04/26/22  7:37 PM  Result Value Ref Range   Glucose-Capillary 397 (H) 70 - 99 mg/dL    Comment: Glucose reference range applies only to samples taken after fasting for at least 8 hours.  Basic metabolic panel     Status: Abnormal   Collection Time: 04/26/22  7:50 PM  Result Value Ref Range   Sodium 135 135 - 145 mmol/L   Potassium 4.0 3.5 - 5.1 mmol/L   Chloride 98 98 - 111 mmol/L   CO2 28 22 - 32 mmol/L   Glucose, Bld 420 (H) 70 - 99 mg/dL    Comment: Glucose reference range applies only to samples taken after fasting for at least 8 hours.   BUN 14 6 - 20 mg/dL   Creatinine, Ser 0.91 0.44 - 1.00 mg/dL   Calcium 10.1 8.9 - 10.3 mg/dL   GFR, Estimated >60 >60 mL/min    Comment: (NOTE) Calculated using the CKD-EPI Creatinine Equation (2021)    Anion gap 9 5 - 15    Comment: Performed at [facility], [address] [place], [place] [address]  CBC     Status: Abnormal   Collection Time: 04/26/22  7:50 PM  Result Value Ref Range   WBC 6.0 4.0 - 10.5 K/uL   RBC 5.29 (H) 3.87 - 5.11 MIL/uL   Hemoglobin 14.5 12.0 - 15.0 g/dL   HCT 43.9 36.0 - 46.0 %   MCV 83.0 80.0 - 100.0 fL   MCH 27.4 26.0 - 34.0 pg   MCHC 33.0 30.0 - 36.0 g/dL   RDW 12.8 11.5 - 15.5 %   Platelets 349 150 - 400 K/uL   nRBC 0.0 0.0 - 0.2 %    Comment: Performed at [facility], [address] [place], [place] [address]  Urinalysis, Routine w reflex microscopic Urine, Clean Catch     Status: Abnormal   Collection Time: 04/26/22  7:50 PM  Result Value Ref Range   Color, Urine COLORLESS (A) YELLOW   APPearance CLEAR CLEAR   Specific Gravity, Urine 1.007 1.005 - 1.030   pH 6.5 5.0 - 8.0   Glucose, UA >1,000 (A) NEGATIVE mg/dL   Hgb urine dipstick NEGATIVE NEGATIVE   Bilirubin Urine NEGATIVE NEGATIVE   Ketones, ur NEGATIVE NEGATIVE mg/dL   Protein, ur NEGATIVE NEGATIVE mg/dL   Nitrite NEGATIVE NEGATIVE   Leukocytes,Ua NEGATIVE NEGATIVE   RBC / HPF 0-5 0 -  5  RBC/hpf   WBC, UA 0-5 0 - 5 WBC/hpf   Squamous Epithelial / LPF 0-5 0 - 5    Comment: Performed at [facility], [address], [place], [place] [address]  Hepatic function panel     Status: None   Collection Time: 04/26/22  7:50 PM  Result Value Ref Range   Total Protein 7.3 6.5 - 8.1 g/dL   Albumin 4.3 3.5 - 5.0 g/dL   AST 24 15 - 41 U/L   ALT 16 0 - 44 U/L   Alkaline Phosphatase 60 38 - 126 U/L   Total Bilirubin 0.7 0.3 - 1.2 mg/dL   Bilirubin, Direct 0.1 0.0 - 0.2 mg/dL   Indirect Bilirubin 0.6 0.3 - 0.9 mg/dL    Comment: Performed at [facility], [place], [place] [address]  SARS Coronavirus 2 by RT PCR (hospital order, performed in [facility] hospital lab) *cepheid single result test* Anterior Nasal Swab     Status: None   Collection Time: 04/26/22  9:29 PM   Specimen: Anterior Nasal Swab  Result Value Ref Range   SARS Coronavirus 2 by RT PCR NEGATIVE NEGATIVE    Comment: (NOTE) SARS-CoV-2 target nucleic acids are NOT DETECTED.  The SARS-CoV-2 RNA is generally detectable in upper and lower respiratory specimens during the acute phase of infection. The lowest concentration of SARS-CoV-2 viral copies this assay can detect is 250 copies / mL. A negative result does not preclude SARS-CoV-2 infection and should not be used as the sole basis for treatment or other patient management decisions.  A negative result may occur with improper specimen collection / handling, submission of specimen other than nasopharyngeal swab, presence of viral mutation(s) within the areas targeted by this assay, and inadequate number of viral copies (<250 copies / mL). A negative result must be combined with clinical observations, patient history, and epidemiological information.  Fact Sheet for Patients:   https://www.patel.info/  Fact Sheet for Healthcare Providers: https://hall.com/  This test  is not yet approved or  cleared by the [place] FDA and has been authorized for detection and/or diagnosis of SARS-CoV-2 by FDA under an Emergency Use Authorization (EUA).  This EUA will remain in effect (meaning this test can be used) for the duration of the COVID-19 declaration under Section 564(b)(1) of the Act, 21 U.S.C. section 360bbb-3(b)(1), unless the authorization is terminated or revoked sooner.  Performed at [facility], [address], [place], [place] [address]   CBC     Status: None   Collection Time: 06/11/22  3:16 PM  Result Value Ref Range   WBC 6.4 4.0 - 10.5 K/uL   RBC 5.10 3.87 - 5.11 Mil/uL   Platelets 290.0 150.0 - 400.0 K/uL   Hemoglobin 14.0 12.0 - 15.0 g/dL   HCT 42.4 36.0 - 46.0 %   MCV 83.2 78.0 - 100.0 fl   MCHC 33.0 30.0 - 36.0 g/dL   RDW 13.3 11.5 - 15.5 %  Comp Met (CMET)     Status: Abnormal   Collection Time: 06/11/22  3:16 PM  Result Value Ref Range   Sodium 136 135 - 145 mEq/L   Potassium 3.6 3.5 - 5.1 mEq/L   Chloride 99 96 - 112 mEq/L   CO2 28 19 - 32 mEq/L   Glucose, Bld 248 (H) 70 - 99 mg/dL   BUN 10 6 - 23 mg/dL   Creatinine, Ser 0.82 0.40 - 1.20 mg/dL   Total Bilirubin  0.7 0.2 - 1.2 mg/dL   Alkaline Phosphatase 64 39 - 117 U/L   AST 27 0 - 37 U/L   ALT 22 0 - 35 U/L   Total Protein 7.3 6.0 - 8.3 g/dL   Albumin 4.6 3.5 - 5.2 g/dL   GFR 85.31 >60.00 mL/min    Comment: Calculated using the CKD-EPI Creatinine Equation (2021)   Calcium 10.0 8.4 - 10.5 mg/dL  HgB A1c     Status: Abnormal   Collection Time: 06/11/22  3:16 PM  Result Value Ref Range   Hgb A1c MFr Bld 11.3 (H) 4.6 - 6.5 %    Comment: Glycemic Control Guidelines for People with Diabetes:Non Diabetic:  <6%Goal of Therapy: <7%Additional Action Suggested:  >8%   TSH     Status: None   Collection Time: 06/11/22  3:16 PM  Result Value Ref Range   TSH 2.54 0.35 - 5.50 uIU/mL  T4, free     Status: None   Collection Time: 06/11/22  3:16 PM  Result  Value Ref Range   Free T4 1.08 0.60 - 1.60 ng/dL    Comment: Specimens from patients who are undergoing biotin therapy and /or ingesting biotin supplements may contain high levels of biotin.  The higher biotin concentration in these specimens interferes with this Free T4 assay.  Specimens that contain high levels  of biotin may cause false high results for this Free T4 assay.  Please interpret results in light of the total clinical presentation of the patient.    Microalbumin / creatinine urine ratio     Status: None   Collection Time: 06/11/22  3:18 PM  Result Value Ref Range   Microalb, Ur <0.7 0.0 - 1.9 mg/dL   Creatinine,U 32.9 mg/dL   Microalb Creat Ratio 2.1 0.0 - 30.0 mg/g  POCT Urinalysis Dipstick (Automated)     Status: Abnormal   Collection Time: 06/11/22  3:39 PM  Result Value Ref Range   Color, UA yellow    Clarity, UA clear    Glucose, UA Negative Negative   Bilirubin, UA Negative    Ketones, UA Negative    Spec Grav, UA 1.010 1.010 - 1.025   Blood, UA Negative    pH, UA 6.0 5.0 - 8.0   Protein, UA Negative Negative   Urobilinogen, UA 0.2 0.2 or 1.0 E.U./dL   Nitrite, UA Negative    Leukocytes, UA Trace (A) Negative  POCT Influenza A/B     Status: Normal   Collection Time: 07/15/22  1:05 PM  Result Value Ref Range   Influenza A, POC Negative Negative   Influenza B, POC Negative Negative  POC COVID-19     Status: Normal   Collection Time: 07/15/22  1:05 PM  Result Value Ref Range   SARS Coronavirus 2 Ag Negative Negative        Results for orders placed or performed in visit on 07/15/22  POC COVID-19  Result Value Ref Range   SARS Coronavirus 2 Ag Negative Negative  POCT Influenza A/B  Result Value Ref Range   Influenza A, POC Negative Negative   Influenza B, POC Negative Negative

## 2022-07-16 ENCOUNTER — Ambulatory Visit: Payer: Medicare Other | Admitting: Internal Medicine

## 2022-09-08 DIAGNOSIS — E1069 Type 1 diabetes mellitus with other specified complication: Secondary | ICD-10-CM | POA: Diagnosis not present

## 2022-09-08 DIAGNOSIS — Z0100 Encounter for examination of eyes and vision without abnormal findings: Secondary | ICD-10-CM | POA: Diagnosis not present

## 2022-09-24 DIAGNOSIS — H40053 Ocular hypertension, bilateral: Secondary | ICD-10-CM | POA: Diagnosis not present

## 2022-09-24 DIAGNOSIS — H25811 Combined forms of age-related cataract, right eye: Secondary | ICD-10-CM | POA: Diagnosis not present

## 2022-10-05 DIAGNOSIS — H25811 Combined forms of age-related cataract, right eye: Secondary | ICD-10-CM | POA: Diagnosis not present

## 2022-10-05 DIAGNOSIS — H409 Unspecified glaucoma: Secondary | ICD-10-CM | POA: Diagnosis not present

## 2022-10-05 DIAGNOSIS — H40051 Ocular hypertension, right eye: Secondary | ICD-10-CM | POA: Diagnosis not present

## 2022-10-05 DIAGNOSIS — H269 Unspecified cataract: Secondary | ICD-10-CM | POA: Diagnosis not present

## 2022-11-03 ENCOUNTER — Telehealth: Payer: Self-pay | Admitting: Internal Medicine

## 2022-11-03 NOTE — Telephone Encounter (Signed)
Copied from [place] [id number]. Topic: Medicare AWV >> Nov 03, 2022  1:24 PM Gillis Santa wrote: Reason for CRM: Called patient to schedule Medicare Annual Wellness Visit (AWV). Left message for patient to call back and schedule Medicare Annual Wellness Visit (AWV).  Last date of AWV: n/a  Please schedule an appointment at any time with Otila Kluver, [facility].  If any questions, please contact me at [phone].  Thank you ,  Shaune Pollack [facility] AWV TEAM Direct Dial [phone]

## 2022-11-06 DIAGNOSIS — H409 Unspecified glaucoma: Secondary | ICD-10-CM | POA: Diagnosis not present

## 2022-11-06 DIAGNOSIS — H2512 Age-related nuclear cataract, left eye: Secondary | ICD-10-CM | POA: Diagnosis not present

## 2022-11-06 DIAGNOSIS — H40052 Ocular hypertension, left eye: Secondary | ICD-10-CM | POA: Diagnosis not present

## 2022-11-06 DIAGNOSIS — H25812 Combined forms of age-related cataract, left eye: Secondary | ICD-10-CM | POA: Diagnosis not present

## 2022-11-06 DIAGNOSIS — H33312 Horseshoe tear of retina without detachment, left eye: Secondary | ICD-10-CM | POA: Diagnosis not present

## 2022-11-06 DIAGNOSIS — H269 Unspecified cataract: Secondary | ICD-10-CM | POA: Diagnosis not present

## 2022-11-25 ENCOUNTER — Telehealth: Payer: Self-pay | Admitting: Internal Medicine

## 2022-11-25 NOTE — Telephone Encounter (Signed)
Copied from [place] [id number]. Topic: Medicare AWV >> Nov 25, 2022 12:45 PM Gillis Santa wrote: Reason for CRM: Called patient to schedule Medicare Annual Wellness Visit (AWV). Unable to reach patient.  Last date of AWV: N/A  Please schedule an appointment at any time with Otila Kluver, [facility]. Please schedule AWVS with Otila Kluver, [place].  If any questions, please contact me at [phone].  Thank you ,  Shaune Pollack [facility] AWV TEAM Direct Dial [phone]

## 2022-12-04 DIAGNOSIS — H33312 Horseshoe tear of retina without detachment, left eye: Secondary | ICD-10-CM | POA: Diagnosis not present

## 2022-12-04 DIAGNOSIS — H2512 Age-related nuclear cataract, left eye: Secondary | ICD-10-CM | POA: Diagnosis not present

## 2022-12-08 DIAGNOSIS — E109 Type 1 diabetes mellitus without complications: Secondary | ICD-10-CM | POA: Diagnosis not present

## 2022-12-08 DIAGNOSIS — H43812 Vitreous degeneration, left eye: Secondary | ICD-10-CM | POA: Diagnosis not present

## 2022-12-08 DIAGNOSIS — H33312 Horseshoe tear of retina without detachment, left eye: Secondary | ICD-10-CM | POA: Diagnosis not present

## 2023-01-06 DIAGNOSIS — H33312 Horseshoe tear of retina without detachment, left eye: Secondary | ICD-10-CM | POA: Diagnosis not present

## 2023-01-15 ENCOUNTER — Encounter: Payer: Self-pay | Admitting: Internal Medicine

## 2023-01-15 ENCOUNTER — Ambulatory Visit (INDEPENDENT_AMBULATORY_CARE_PROVIDER_SITE_OTHER): Payer: Medicare HMO | Admitting: Internal Medicine

## 2023-01-15 VITALS — BP 132/84 | HR 64 | Temp 96.1°F | Ht 62.0 in | Wt 104.4 lb

## 2023-01-15 DIAGNOSIS — Z7984 Long term (current) use of oral hypoglycemic drugs: Secondary | ICD-10-CM

## 2023-01-15 DIAGNOSIS — H33319 Horseshoe tear of retina without detachment, unspecified eye: Secondary | ICD-10-CM | POA: Insufficient documentation

## 2023-01-15 DIAGNOSIS — R634 Abnormal weight loss: Secondary | ICD-10-CM

## 2023-01-15 DIAGNOSIS — Z9641 Presence of insulin pump (external) (internal): Secondary | ICD-10-CM | POA: Diagnosis not present

## 2023-01-15 DIAGNOSIS — Z794 Long term (current) use of insulin: Secondary | ICD-10-CM | POA: Diagnosis not present

## 2023-01-15 DIAGNOSIS — R112 Nausea with vomiting, unspecified: Secondary | ICD-10-CM | POA: Diagnosis not present

## 2023-01-15 DIAGNOSIS — E109 Type 1 diabetes mellitus without complications: Secondary | ICD-10-CM

## 2023-01-15 DIAGNOSIS — H33313 Horseshoe tear of retina without detachment, bilateral: Secondary | ICD-10-CM | POA: Diagnosis not present

## 2023-01-15 DIAGNOSIS — N179 Acute kidney failure, unspecified: Secondary | ICD-10-CM

## 2023-01-15 DIAGNOSIS — E1065 Type 1 diabetes mellitus with hyperglycemia: Secondary | ICD-10-CM

## 2023-01-15 DIAGNOSIS — E10649 Type 1 diabetes mellitus with hypoglycemia without coma: Secondary | ICD-10-CM | POA: Diagnosis not present

## 2023-01-15 DIAGNOSIS — F32A Depression, unspecified: Secondary | ICD-10-CM

## 2023-01-15 DIAGNOSIS — F319 Bipolar disorder, unspecified: Secondary | ICD-10-CM

## 2023-01-15 HISTORY — DX: Horseshoe tear of retina without detachment, unspecified eye: H33.319

## 2023-01-15 LAB — CBC WITH DIFFERENTIAL/PLATELET
Basophils Absolute: 0.1 10*3/uL (ref 0.0–0.1)
Basophils Relative: 1.1 % (ref 0.0–3.0)
Eosinophils Absolute: 0.1 10*3/uL (ref 0.0–0.7)
Eosinophils Relative: 2.7 % (ref 0.0–5.0)
HCT: 43 % (ref 36.0–46.0)
Hemoglobin: 14.4 g/dL (ref 12.0–15.0)
Lymphocytes Relative: 13.4 % (ref 12.0–46.0)
Lymphs Abs: 0.6 10*3/uL — ABNORMAL LOW (ref 0.7–4.0)
MCHC: 33.6 g/dL (ref 30.0–36.0)
MCV: 85.1 fl (ref 78.0–100.0)
Monocytes Absolute: 0.4 10*3/uL (ref 0.1–1.0)
Monocytes Relative: 7.9 % (ref 3.0–12.0)
Neutro Abs: 3.6 10*3/uL (ref 1.4–7.7)
Neutrophils Relative %: 74.9 % (ref 43.0–77.0)
Platelets: 327 10*3/uL (ref 150.0–400.0)
RBC: 5.06 Mil/uL (ref 3.87–5.11)
RDW: 13.9 % (ref 11.5–15.5)
WBC: 4.8 10*3/uL (ref 4.0–10.5)

## 2023-01-15 LAB — COMPREHENSIVE METABOLIC PANEL
ALT: 13 U/L (ref 0–35)
AST: 16 U/L (ref 0–37)
Albumin: 4.4 g/dL (ref 3.5–5.2)
Alkaline Phosphatase: 76 U/L (ref 39–117)
BUN: 13 mg/dL (ref 6–23)
CO2: 30 mEq/L (ref 19–32)
Calcium: 10 mg/dL (ref 8.4–10.5)
Chloride: 102 mEq/L (ref 96–112)
Creatinine, Ser: 0.84 mg/dL (ref 0.40–1.20)
GFR: 82.53 mL/min (ref >60.00)
Glucose, Bld: 122 mg/dL — ABNORMAL HIGH (ref 70–99)
Potassium: 4.6 mEq/L (ref 3.5–5.1)
Sodium: 140 mEq/L (ref 135–145)
Total Bilirubin: 0.5 mg/dL (ref 0.2–1.2)
Total Protein: 6.9 g/dL (ref 6.0–8.3)

## 2023-01-15 LAB — MICROALBUMIN / CREATININE URINE RATIO
Creatinine,U: 265.1 mg/dL
Microalb Creat Ratio: 1.3 mg/g (ref 0.0–30.0)
Microalb, Ur: 3.5 mg/dL — ABNORMAL HIGH (ref 0.0–1.9)

## 2023-01-15 LAB — LIPID PANEL
Cholesterol: 177 mg/dL (ref 0–200)
HDL: 58.3 mg/dL (ref >39.00)
LDL Cholesterol: 101 mg/dL — ABNORMAL HIGH (ref 0–99)
NonHDL: 118.6
Total CHOL/HDL Ratio: 3
Triglycerides: 90 mg/dL (ref 0.0–149.0)
VLDL: 18 mg/dL (ref 0.0–40.0)

## 2023-01-15 LAB — HEMOGLOBIN A1C: Hgb A1c MFr Bld: 11.3 % — ABNORMAL HIGH (ref 4.6–6.5)

## 2023-01-15 MED ORDER — INSULIN REGULAR HUMAN 100 UNIT/ML IJ SOLN: 10.0000 [IU] | Freq: Three times a day (TID) | INTRAMUSCULAR | 11 refills | Status: AC

## 2023-01-15 MED ORDER — DEXCOM G7 SENSOR MISC: 11 refills | Status: DC

## 2023-01-15 MED ORDER — GLUCAGON 0.5 MG/0.1ML ~~LOC~~ SOAJ: 0.5000 mg | Freq: Every day | SUBCUTANEOUS | 11 refills | Status: DC

## 2023-01-15 MED ORDER — OMNIPOD 5 G7 INTRO (GEN 5) KIT: 1.0000 | PACK | 11 refills | Status: DC

## 2023-01-15 MED ORDER — LANTUS SOLOSTAR 100 UNIT/ML ~~LOC~~ SOPN: 25.0000 [IU] | PEN_INJECTOR | Freq: Every morning | SUBCUTANEOUS | 11 refills | Status: AC

## 2023-01-15 MED ORDER — DEXCOM G7 RECEIVER DEVI: 1 refills | Status: DC

## 2023-01-15 MED ORDER — ONDANSETRON 4 MG PO TBDP: 4.0000 mg | ORAL_TABLET | Freq: Three times a day (TID) | ORAL | 0 refills | Status: AC | PRN

## 2023-01-15 MED ORDER — "INSULIN SYRINGE 28G X 1/2"" 0.5 ML MISC": 1 refills | Status: AC

## 2023-01-15 MED ORDER — GVOKE HYPOPEN 2-PACK 0.5 MG/0.1ML ~~LOC~~ SOAJ: 0.5000 mg | Freq: Every day | SUBCUTANEOUS | 11 refills | Status: AC | PRN

## 2023-01-15 MED ORDER — OLANZAPINE 5 MG PO TABS: 5.0000 mg | ORAL_TABLET | Freq: Every day | ORAL | 1 refills | Status: DC

## 2023-01-15 MED ORDER — GVOKE HYPOPEN 2-PACK 0.5 MG/0.1ML ~~LOC~~ SOAJ: 0.5000 mg | Freq: Every day | SUBCUTANEOUS | 1 refills | Status: DC | PRN

## 2023-01-15 NOTE — Progress Notes (Unsigned)
[facility]: [phone]   Routine Medical Office Visit  Patient:  Karen Hayden      Age: 47 y.o.       Sex:  female  Date:   01/15/2023 PCP:    Lula Olszewski, MD   Today's Healthcare Provider: Lula Olszewski, MD   Assessment and Plan:   There are no diagnoses linked to this encounter.   Treatment plan discussed and reviewed in detail. Explained medication safety and potential side effects. Agreed on patient returning to office if symptoms worsen, persist, or new symptoms develop. Discussed precautions in case of needing to visit the Emergency Department. Answered all patient questions and confirmed understanding and comfort with the plan. Encouraged patient to contact our office if they have any questions or concerns.      Clinical Presentation:     47 y.o. 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    Updated chart data:  No problems updated.  Reviewed chart data: Active Ambulatory Problems    Diagnosis Date Noted   Type 1 diabetes mellitus (HCC) 01/04/2009   Acute bronchitis 01/04/2009   SEIZURE DISORDER 01/04/2009   Diabetic gastroparesis (HCC) 06/04/2011   Nausea with vomiting 06/21/2013   Change in bowel habits 06/21/2013   Unspecified constipation 01/26/2014   Cough 01/26/2014   Tobacco use disorder 01/26/2014   Protein-calorie malnutrition, severe (HCC) 01/29/2014   Marijuana dependence (HCC) 02/17/2018   Coronary artery spasm (HCC) 03/25/2018   Palpitations 07/06/2018   Acute posttraumatic stress disorder 07/06/2018   Nausea & vomiting  05/03/2019   ARF (acute renal failure) (HCC) 05/03/2019   Hypothyroidism 06/11/2022   Unintentional weight loss 06/11/2022   Depressive disorder 06/11/2022   Bipolar 1 disorder (HCC)    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   Acne 06/18/2022   Hereditary and idiopathic neuropathy, unspecified 06/18/2022   Hypertension 06/18/2022   Joint pain 06/18/2022   Pure hypercholesterolemia 06/18/2022   Resolved Ambulatory Problems    Diagnosis Date Noted   Left sided abdominal pain 06/21/2013   Gastroparesis 06/21/2013   Other general symptoms(780.99)  06/21/2013   Nausea & vomiting 01/26/2014   Diabetic ketoacidosis without coma associated with type 1 diabetes mellitus (HCC)    Chest pain 02/17/2018   Unstable angina (HCC)    Past Medical History:  Diagnosis Date   Anxiety    Chronic kidney infection    Diabetes mellitus type I (HCC)    DM (diabetes mellitus) (HCC)    Hyperlipidemia    Seizure disorder (HCC)    Umbilical hernia     Outpatient Medications Prior to Visit  Medication Sig   Continuous Blood Gluc Receiver (DEXCOM G6 RECEIVER) DEVI Please provide 1 receiver   Continuous Blood Gluc Sensor (DEXCOM G6 SENSOR) MISC New sensor every 10 days   Continuous Blood Gluc Transmit (DEXCOM G6 TRANSMITTER) MISC Please provide once every 3 months   Glucagon (GVOKE HYPOPEN 2-PACK) 0.5 MG/0.1ML SOAJ Inject 0.5 mg into the skin daily as needed (for low blood sugar).   Glucagon 0.5 MG/0.1ML SOAJ Inject 0.5 mg into the skin daily. As needed (for low  blood sugar).   Insulin Disposable Pump (OMNIPOD 5 G6 INTRO, GEN 5,) KIT 0.8 Units/hr by Does not apply route continuous. Using OmniPod along with Dexcom G6 to manage diabetes type 1 brittle please see Dr. For assistance with setting up but initial settings are planned to be 0.8 units/h continuous and 10-1 bolus ratio   insulin glargine (LANTUS SOLOSTAR) 100 UNIT/ML Solostar Pen Inject 20 Units into the skin in the morning. Replaces insulin  NPH.   insulin NPH Human (NOVOLIN N) 100 UNIT/ML injection Inject 16 Units into the skin 2 (two) times daily before a meal.    insulin regular (NOVOLIN R) 100 units/mL injection Inject into the skin See admin instructions. 1 unit per 8 grams of carbs.at meals and to correct high blood sugar.   INSULIN SYRINGE .5CC/28G (INS SYRINGE/NEEDLE .5CC/28G) 28G X 1/2" 0.5 ML MISC Use as instructed to administer insulin   levothyroxine (SYNTHROID) 100 MCG tablet Take 1 tablet (100 mcg total) by mouth daily.   ondansetron (ZOFRAN-ODT) 4 MG disintegrating tablet Take 1 tablet (4 mg total) by mouth every 8 (eight) hours as needed for nausea or vomiting (for nausea from wegovy or other source).   OLANZapine (ZYPREXA) 5 MG tablet Take 1 tablet (5 mg total) by mouth at bedtime. (Patient not taking: Reported on 01/15/2023)   No facility-administered medications prior to visit.         Clinical Data Analysis:    Physical Exam  BP 132/84   Pulse 64   Temp (!) 96.1 F (35.6 C) (Temporal)   Ht 5\' 2"  (1.575 m)   Wt 104 lb 6.4 oz (47.4 kg)   SpO2 100%   BMI 19.10 kg/m  Wt Readings from Last 10 Encounters:  01/15/23 104 lb 6.4 oz (47.4 kg)  07/15/22 100 lb 6.4 oz (45.5 kg)  07/15/22 100 lb 9.6 oz (45.6 kg)  06/25/22 100 lb 6.4 oz (45.5 kg)  06/18/22 95 lb 6.4 oz (43.3 kg)  06/11/22 98 lb (44.5 kg)  04/26/22 96 lb (43.5 kg)  10/02/21 101 lb (45.8 kg)  01/01/21 102 lb (46.3 kg)  04/05/20 105 lb 12.8 oz (48 kg)   Vital signs reviewed.  Nursing notes reviewed. Weight trend reviewed. Abnormalities and Problem-Specific physical exam findings:  *** General Appearance:  No acute distress appreciable.   Well-groomed, healthy-appearing female.  Well proportioned with no abnormal fat distribution.  Good muscle tone. Skin: Clear and well-hydrated. Pulmonary:  Normal work of breathing at rest, no respiratory distress apparent. SpO2: 100 %  Musculoskeletal: All extremities are intact.  Neurological:  Awake, alert,  oriented, and engaged.  No obvious focal neurological deficits or cognitive impairments.  Sensorium seems unclouded.   Speech is clear and coherent with logical content. Psychiatric:  Appropriate mood, pleasant and cooperative demeanor, thoughtful and engaged during the exam  Results Reviewed:  {Labs  Heme  Chem  Endocrine  Serology  Results Review (optional):23779}  No results found for any visits on 01/15/23.  No results found for this or any previous visit (from the past 2160 hour(s)).  No image results found.   No results found.     Signed: Lula Olszewski, MD 01/15/2023 9:20 AM

## 2023-01-16 ENCOUNTER — Encounter: Payer: Self-pay | Admitting: Internal Medicine

## 2023-01-16 DIAGNOSIS — Z7984 Long term (current) use of oral hypoglycemic drugs: Secondary | ICD-10-CM

## 2023-01-16 DIAGNOSIS — Z794 Long term (current) use of insulin: Secondary | ICD-10-CM | POA: Insufficient documentation

## 2023-01-16 DIAGNOSIS — Z9641 Presence of insulin pump (external) (internal): Secondary | ICD-10-CM | POA: Insufficient documentation

## 2023-01-16 HISTORY — DX: Long term (current) use of insulin: Z79.4

## 2023-01-16 HISTORY — DX: Long term (current) use of oral hypoglycemic drugs: Z79.84

## 2023-01-16 LAB — TSH+FREE T4: TSH W/REFLEX TO FT4: 24.69 mIU/L — ABNORMAL HIGH

## 2023-01-16 LAB — T4, FREE: Free T4: 0.6 ng/dL — ABNORMAL LOW (ref 0.8–1.8)

## 2023-01-16 MED ORDER — DEXCOM G7 RECEIVER DEVI: 1 refills | Status: DC

## 2023-01-16 MED ORDER — DEXCOM G7 SENSOR MISC: 11 refills | Status: AC

## 2023-01-16 MED ORDER — OMNIPOD 5 G7 INTRO (GEN 5) KIT: 1.0000 | PACK | 11 refills | Status: AC

## 2023-01-16 NOTE — Assessment & Plan Note (Signed)
Reviewed. Sustained improvement noted, so resolved from the active problem list and moved to [facility] for future reference and monitoring.  Although it's challenging to completely rule out all serious potential causes, I believe this problem was most likely due to transient severe stress and worsening brittle type 1 diabetes mellitus

## 2023-01-16 NOTE — Assessment & Plan Note (Signed)
Has had serious diabetes mellitus complication since last office visit. Wants to try hard to optimize sugar control- for that we need continuous glucose monitor and omnipod combo.  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  My hope is that by acquiring these upgrades for diabetes mellitus management- she will be able to manage her brittle type 1 diabetes mellitus while still taking olanzapine for psychiatry issues.   I spent over 30 minutes just documenting the chart the right way to overcome barriers to receiving continuous glucose monitor and insulin pump.  In particular we discussed and I'm documenting here that she has brittle type 1 diabetes mellitus retinopathy and history of level 3 hypoglycemia (glucose <54 mg/dL and altered mental  that resulted in hospital admission and intubation.   HGBA1C  11.3 01/15/2023

## 2023-01-17 ENCOUNTER — Other Ambulatory Visit: Payer: Self-pay | Admitting: Internal Medicine

## 2023-01-17 DIAGNOSIS — E109 Type 1 diabetes mellitus without complications: Secondary | ICD-10-CM

## 2023-01-17 DIAGNOSIS — E039 Hypothyroidism, unspecified: Secondary | ICD-10-CM

## 2023-01-17 MED ORDER — LEVOTHYROXINE SODIUM 125 MCG PO TABS: 125.0000 ug | ORAL_TABLET | Freq: Every day | ORAL | 3 refills | Status: AC

## 2023-01-17 MED ORDER — LOSARTAN POTASSIUM 25 MG PO TABS: 25.0000 mg | ORAL_TABLET | Freq: Every day | ORAL | 3 refills | Status: DC

## 2023-01-18 ENCOUNTER — Telehealth: Payer: Self-pay

## 2023-01-18 NOTE — Progress Notes (Signed)
  Chronic Care Management   Note  01/18/2023 Name: Karen Hayden MRN: [id number] DOB: 1976-02-13  Karen Hayden is a 47 y.o. year old female who is a primary care patient of Karen Olszewski, MD. I reached out to Karen Hayden by phone today in response to a referral sent by Ms. Karen Hayden's PCP.  The first contact attempt was unsuccessful.   Follow up plan: Additional outreach attempts will be made.  Penne Lash, RMA Care Guide [facility]  [place], [place] [address] Direct Dial: [phone] [email or web address]

## 2023-01-22 ENCOUNTER — Ambulatory Visit (INDEPENDENT_AMBULATORY_CARE_PROVIDER_SITE_OTHER): Payer: Medicare HMO | Admitting: Internal Medicine

## 2023-01-22 ENCOUNTER — Encounter: Payer: Self-pay | Admitting: Internal Medicine

## 2023-01-22 VITALS — BP 108/64 | HR 75 | Temp 98.6°F | Ht 62.0 in | Wt 108.0 lb

## 2023-01-22 DIAGNOSIS — E7800 Pure hypercholesterolemia, unspecified: Secondary | ICD-10-CM

## 2023-01-22 DIAGNOSIS — E782 Mixed hyperlipidemia: Secondary | ICD-10-CM

## 2023-01-22 DIAGNOSIS — G40909 Epilepsy, unspecified, not intractable, without status epilepticus: Secondary | ICD-10-CM | POA: Diagnosis not present

## 2023-01-22 DIAGNOSIS — N12 Tubulo-interstitial nephritis, not specified as acute or chronic: Secondary | ICD-10-CM

## 2023-01-22 DIAGNOSIS — N159 Renal tubulo-interstitial disease, unspecified: Secondary | ICD-10-CM | POA: Diagnosis not present

## 2023-01-22 DIAGNOSIS — E890 Postprocedural hypothyroidism: Secondary | ICD-10-CM | POA: Diagnosis not present

## 2023-01-22 DIAGNOSIS — Z789 Other specified health status: Secondary | ICD-10-CM | POA: Diagnosis not present

## 2023-01-22 DIAGNOSIS — R197 Diarrhea, unspecified: Secondary | ICD-10-CM | POA: Diagnosis not present

## 2023-01-22 DIAGNOSIS — N182 Chronic kidney disease, stage 2 (mild): Secondary | ICD-10-CM | POA: Diagnosis not present

## 2023-01-22 DIAGNOSIS — E109 Type 1 diabetes mellitus without complications: Secondary | ICD-10-CM | POA: Diagnosis not present

## 2023-01-22 DIAGNOSIS — N179 Acute kidney failure, unspecified: Secondary | ICD-10-CM | POA: Diagnosis not present

## 2023-01-22 DIAGNOSIS — E1065 Type 1 diabetes mellitus with hyperglycemia: Secondary | ICD-10-CM

## 2023-01-22 DIAGNOSIS — F419 Anxiety disorder, unspecified: Secondary | ICD-10-CM | POA: Diagnosis not present

## 2023-01-22 HISTORY — DX: Diarrhea, unspecified: R19.7

## 2023-01-22 MED ORDER — EMPAGLIFLOZIN 10 MG PO TABS: 10.0000 mg | ORAL_TABLET | Freq: Every day | ORAL | 3 refills | Status: DC

## 2023-01-22 MED ORDER — CEFTRIAXONE SODIUM 250 MG IJ SOLR
250.0000 mg | Freq: Once | INTRAMUSCULAR | Status: AC
  Administered 2023-01-22: 250 mg via INTRAMUSCULAR

## 2023-01-22 MED ORDER — CIPROFLOXACIN HCL 500 MG PO TABS: 500.0000 mg | ORAL_TABLET | Freq: Two times a day (BID) | ORAL | 0 refills | Status: DC

## 2023-01-22 MED ORDER — REPATHA SURECLICK 140 MG/ML ~~LOC~~ SOAJ: 140.0000 mg | SUBCUTANEOUS | 2 refills | Status: AC

## 2023-01-23 ENCOUNTER — Encounter: Payer: Self-pay | Admitting: Internal Medicine

## 2023-01-23 DIAGNOSIS — Z789 Other specified health status: Secondary | ICD-10-CM | POA: Insufficient documentation

## 2023-01-23 DIAGNOSIS — E782 Mixed hyperlipidemia: Secondary | ICD-10-CM | POA: Insufficient documentation

## 2023-01-23 DIAGNOSIS — E785 Hyperlipidemia, unspecified: Secondary | ICD-10-CM | POA: Insufficient documentation

## 2023-01-23 NOTE — Assessment & Plan Note (Addendum)
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 Control better Need recheck around July 2024.  Will continue close follow up

## 2023-01-23 NOTE — Assessment & Plan Note (Signed)
Poorly controlled and brittle... But we will make progress soon.  Need to follow up on the omnipod.  She has Gvoke. Will return to clinic in 2-3 week(s) with the continuous glucose monitor data that should allow [place] to fine tune insulin.  I advised patient to start working on getting good at carb counting will allow for more accurate correction doses.

## 2023-01-23 NOTE — Progress Notes (Addendum)
[facility]: [phone]   Routine Medical Office Visit  Patient:  Karen Hayden      Age: 47 y.o.       Sex:  female  Date:   01/22/2023 PCP:    Lula Olszewski, MD   Today's Healthcare Provider: Lula Olszewski, MD   Assessment and Plan:   Type 1 diabetes mellitus with hyperglycemia [facility]) Assessment & Plan: Poorly controlled and brittle... But we will make progress soon.  Need to follow up on the omnipod.  She has Gvoke. Will return to clinic in 2-3 week(s) with the continuous glucose monitor data that should allow [place] to fine tune insulin.  I advised patient to start working on getting good at carb counting will allow for more accurate correction doses.  Orders: -     Repatha SureClick; Inject 140 mg into the skin every 14 (fourteen) days.  Dispense: 2 mL; Refill: 2 -     Empagliflozin; Take 1 tablet (10 mg total) by mouth daily before breakfast.  Dispense: 90 tablet; Refill: 3  Chronic kidney disease, stage 2 (mild) Assessment & Plan: Try to get jardiance to protect kidneys from uncontrolled diabetes mellitus.  Mild chronic kidney disease with microalbuminuria currentl,y  Orders: -     Urine Culture  Pyelonephritis -     Ciprofloxacin HCl; Take 1 tablet (500 mg total) by mouth 2 (two) times daily for 7 days.  Dispense: 14 tablet; Refill: 0 -     cefTRIAXone Sodium  Pure hypercholesterolemia Assessment & Plan: Severe statin intolerance- lifelong poorly controlled diabetes mellitus, high risk for cardiovascular disease- need to get Repatha or praluent if possible.  Orders: -     Repatha SureClick; Inject 140 mg into the skin every 14 (fourteen) days.  Dispense: 2 mL; Refill: 2  Diarrhea, unspecified type  Mixed hyperlipidemia -     Repatha SureClick; Inject 140 mg into the skin every 14 (fourteen) days.  Dispense: 2 mL; Refill: 2  Statin intolerance Assessment & Plan: Documented this 01/23/2023 as part of cholesterol management plan.  She can  not tolerate even low dose intermittent statin.  Orders: -     Repatha SureClick; Inject 140 mg into the skin every 14 (fourteen) days.  Dispense: 2 mL; Refill: 2  Postablative hypothyroidism Assessment & Plan: I tried to get more info but couldn't find anything about the radioactive iodine treatment(s) except the date and that it was for thyrotoxicosis, I think we might need more labs. Control better Need recheck around July 2024.  Will continue close follow up       Treatment plan discussed and reviewed in detail. Explained medication safety and potential side effects. Agreed on patient returning to office if symptoms worsen, persist, or new symptoms develop. Discussed precautions in case of needing to visit the Emergency Department. Answered all patient questions and confirmed understanding and comfort with the plan. Encouraged patient to contact our office if they have any questions or concerns.       Clinical Presentation:    47 y.o. female here today for Diabetes (Pt in office for Diabetes F/U; pt states blood sugar has been better from before. Recent 180-160. Pt c/o kidney pain and some trouble urinating. Thinking it may be from kidney injury discussed previously. Thinks bp is giving her diarrhea or is she just suffering form possible stomach bug)  HPI   The following table summarizes the actions taken during the encounter to manage (by editing,updating, adding,and resolving)  Problem  Statin Intolerance   failed rosuvastatin 2022 and atorvastatin prior to tha    Mixed Hyperlipidemia  Chronic Kidney Disease, Stage 2 (Mild)   Interim history:   patient not aware of any recent changes in urination, swelling (edema), fatigue, or any new cardiovascular symptoms.  Not aware of any recent nephrotoxic medications.  Adherent to prescribed treatments.   Medications: Diuretics: not taking any ARB: on losartan 25 mg daily SGLT2 inhibitor: will trial jardiance  Prior  history: Nephrologist: No care team member to display Lifestyle and Nutrition: renal diet  Limit sodium and protein intake, nutrition consult considered Lab Results  Component Value Date   GFR 82.53 01/15/2023   CREATININE 0.84 01/15/2023   CREATININE 0.78 07/15/2022   CREATININE 0.82 06/11/2022   CREATININE 0.91 04/26/2022   CREATININE 1.01 (H) 01/01/2021   NA 140 01/15/2023   K 4.6 01/15/2023   CL 102 01/15/2023   CO2 30 01/15/2023   CALCIUM 10.0 01/15/2023   PROT 6.9 01/15/2023   ALBUMIN 4.4 01/15/2023   HGB 14.4 01/15/2023   HCT 43.0 01/15/2023   CRP 2 08/12/2018   ESRSEDRATE 6 06/20/2013   HGBA1C 11.3 (H) 01/15/2023      Component Value Date/Time   COLORURINE YELLOW 07/15/2022 1409   APPEARANCEUR CLEAR 07/15/2022 1409   LABSPEC 1.014 07/15/2022 1409   PHURINE 7.5 07/15/2022 1409   GLUCOSEU NEGATIVE 07/15/2022 1409   GLUCOSEU NEGATIVE 06/20/2013 1522   HGBUR NEGATIVE 07/15/2022 1409   BILIRUBINUR NEGATIVE 07/15/2022 1409   BILIRUBINUR Negative 06/11/2022 1539   KETONESUR NEGATIVE 07/15/2022 1409   PROTEINUR NEGATIVE 07/15/2022 1409   UROBILINOGEN 0.2 06/11/2022 1539   UROBILINOGEN 0.2 06/21/2015 0404   NITRITE NEGATIVE 07/15/2022 1409   LEUKOCYTESUR NEGATIVE 07/15/2022 1409   MICROALBUR 3.5 (H) 01/15/2023 0957     Pure Hypercholesterolemia   Goal LDL: 55 due to  Statin myopathy history: failed rosuvastatin 2022 and atorvastatin prior to that . Can tolerate zetia but it doesn't control onprior labs which is why she had 2022 twice a week rosuvastatin trial.. Can't even tolerate twice weekly 10 mg statin. Current Regimen: Diet and exercise   Lab Results  Component Value Date   CHOL 177 01/15/2023   CHOL 129 02/18/2018   Lab Results  Component Value Date   HDL 58.30 01/15/2023   HDL 38 (L) 02/18/2018   Lab Results  Component Value Date   LDLCALC 101 (H) 01/15/2023   LDLCALC 63 02/18/2018   Lab Results  Component Value Date   TRIG 90.0 01/15/2023    TRIG 140 02/18/2018   Lab Results  Component Value Date   CHOLHDL 3 01/15/2023   CHOLHDL 3.4 02/18/2018  No results found for: "LDLDIRECT"  The 10-year ASCVD risk score (Arnett DK, et al., 2019) is: 1.3%   Values used to calculate the score:     Age: 85 years     Sex: Female     Is Non-Hispanic African American: No     Diabetic: Yes     Tobacco smoker: No     Systolic Blood Pressure: 108 mmHg     Is BP treated: Yes     HDL Cholesterol: 58.3 mg/dL     Total Cholesterol: 177 mg/dL    Hypothyroidism   Jan 23, 2023 interim history:   Feeling better since going up from 100 mcg to 125 mcg  Prior history: Had radioactive iodine ablation in May 2020 for hyperthyroidism at that time has been hypothyroid since 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 Procedure note for radioactive iodine 12/2018 states: Hyperthyroidism nodular Graves disease versus toxic multinodular nodular goiter. TSH equal 0.00.CLINICAL DATA:  Hyperthyroidism with neck swelling, tenderness,difficulty swallowing, weight loss and heat intolerance. History of hypothyroidism. Serum TSH level 0.141 on 02/17/2018. There is heterogeneous uptake by the thyroid gland with nodular areas of increased activity in both lobes, right greater than left. No cold nodules are identified. Elevated radioactive iodine uptake (46.2%) by the thyroid gland at 24 hours with nodular areas of increased activity consistent with toxic multinodular goiter.   Lab Results  Component Value Date   TSH 2.54 06/11/2022   TSH 0.141 (L) 02/17/2018   TSH 0.011 (L) 05/05/2017   TSH 1.330 01/26/2014   TSH 3.04 06/20/2013   FREET4 0.6 (L) 01/15/2023   FREET4 1.08 06/11/2022   FREET4 0.70 (L) 02/17/2018   FREET4 1.26 (H) 05/08/2017  anti-TPO antibodies: N/A  anti-Tg antibodies: N/A  Anti-TSI antibodies: N/A    Family history of thyroid disease: N/A  Patient last evaluated for thyroid nodules: 2020 ultrasound showing toxic  multinodular goiter   Last ultrasound for thyroid nodules: N/A  Last heart exam for rhythm check:  Echocardiogram 03/07/2020: Normal LV systolic function with EF 55-60%. Left ventricle cavity is normal in size. Normal global wall motion. Normal diastolic filling pattern, normal LAP. Calculated EF 58%. Mild (Grade I) aortic regurgitation. Mild (Grade I) mitral regurgitation. Mild tricuspid regurgitation. No evidence of pulmonary hypertension. IVC is dilated with a respiratory response of >50%. No significant change compared to prior study dated 02/18/2018.      Type 1 Diabetes Mellitus (Hcc)   Jan 22, 2023 interim history:  they are shipping out the dexcom G7 today, but didn't mention Omnipod. She was able to get Gvoke but has no one to give it to her - so without the continuous glucose monitor yet, she has been unable to be aggressive on insulin. She reports that her perception is that her diabetes as been marginally controlled since last visit  She reports home capillary blood glucose readings since last visit are in the range of 180's , back on zyprexa She reports she has NOT had any significant lows or highs or side effect(s)  She reports compliance/adherence with his current medication(s) which include: Diabetic Medications as of 01/22/2023           Glucagon (GVOKE HYPOPEN 2-PACK) 0.5 MG/0.1ML SOAJ (Taking) Inject 0.5 mg into the skin daily as needed (for low blood sugar).   Glucagon (GVOKE HYPOPEN 2-PACK) 0.5 MG/0.1ML SOAJ (Taking) Inject 0.5 mg into the skin daily as needed (for low blood sugar).   Glucagon 0.5 MG/0.1ML SOAJ (Taking) Inject 0.5 mg into the skin daily. As needed (for low blood sugar).   insulin glargine (LANTUS SOLOSTAR) 100 UNIT/ML Solostar Pen (Taking) Inject 25 Units into the skin in the morning. Replaces insulin NPH.   Stopped novolin N    insulin regular (NOVOLIN R) 100 units/mL injection (Taking) Inject 0.1 mLs (10 Units total) into the skin 3 (three) times  daily before meals. 1 unit per 8 grams of carbs.at meals and to correct high blood sugar.      Labs and Risk Scores: Lab Results  Component Value Date   HGBA1C 11.3 (H) 01/15/2023   HGBA1C 11.3 (H) 06/11/2022   HGBA1C 7.8 (H) 05/03/2019   HGBA1C 7.7 (H) 02/17/2018   HGBA1C 9.1 (H) 05/08/2017   Lab Results  Component Value Date   LDLCALC 101 (  H) 01/15/2023   Lab Results  Component Value Date   MICROALBUR 3.5 (H) 01/15/2023   MICROALBUR <0.7 06/11/2022  Diabetes Composite Score: 2  Values used to calculate this score:   Points  Metrics      1        Blood Pressure: 108/64      0        Prescribed Statins: No      0        Hemoglobin A1c: 11.3%      1        Smokes Tobacco: No      0        Prescribed Aspirin: No      Health Maintenance: Diabetes Health Maintenance Due  Topic Date Due   OPHTHALMOLOGY EXAM  01/22/2023 (Originally 03/29/1986)   FOOT EXAM  03/25/2023 (Originally 03/29/1986)   HEMOGLOBIN A1C  07/18/2023  No foot exam found Diabetic Foot Exam - Simple   No data filed   Care Team Ophthalmologist:  Elwin Mocha, MD     SEIZURE DISORDER          Reviewed chart data: Active Ambulatory Problems    Diagnosis Date Noted   Type 1 diabetes mellitus (HCC) 01/04/2009   SEIZURE DISORDER 01/04/2009   Diabetic gastroparesis (HCC) 06/04/2011   Change in bowel habits 06/21/2013   Unspecified constipation 01/26/2014   Cough 01/26/2014   Tobacco use disorder 01/26/2014   Protein-calorie malnutrition, severe (HCC) 01/29/2014   Marijuana dependence (HCC) 02/17/2018   Coronary artery spasm (HCC) 03/25/2018   Palpitations 07/06/2018   Hypothyroidism 06/11/2022   Depressive disorder 06/11/2022   Bipolar 1 disorder (HCC)    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   Acne 06/18/2022   Hereditary and idiopathic neuropathy, unspecified 06/18/2022   Hypertension 06/18/2022   Joint pain 06/18/2022   Pure hypercholesterolemia 06/18/2022   Retinal tear  01/15/2023   Long term current use of oral hypoglycemic drug 01/16/2023   Insulin long-term use (HCC) 01/16/2023   Insulin pump status 01/16/2023   Chronic kidney disease, stage 2 (mild) 01/22/2023   Statin intolerance 01/23/2023   Mixed hyperlipidemia 01/23/2023   Resolved Ambulatory Problems    Diagnosis Date Noted   Acute bronchitis 01/04/2009   Left sided abdominal pain 06/21/2013   Gastroparesis 06/21/2013   Nausea with vomiting 06/21/2013   Other general symptoms(780.99)  06/21/2013   Nausea & vomiting 01/26/2014   Diabetic ketoacidosis without coma associated with type 1 diabetes mellitus (HCC)    Chest pain 02/17/2018   Unstable angina (HCC)    Acute posttraumatic stress disorder 07/06/2018   Nausea & vomiting 05/03/2019   ARF (acute renal failure) (HCC) 05/03/2019   Unintentional weight loss 06/11/2022   Diarrhea 01/22/2023   Past Medical History:  Diagnosis Date   Anxiety    Chronic kidney infection    Diabetes mellitus type I (HCC)    DM (diabetes mellitus) (HCC)    Hyperlipidemia    Seizure disorder (HCC)    Umbilical hernia     Outpatient Medications Prior to Visit  Medication Sig   Continuous Blood Gluc Transmit (DEXCOM G6 TRANSMITTER) MISC Please provide once every 3 months   Continuous Glucose Receiver (DEXCOM G7 RECEIVER) DEVI Please provide 1 dexcom g7 receiver   Continuous Glucose Sensor (DEXCOM G7 SENSOR) MISC Apply sensor every 10 days. Pharmacy please provide 3 boxes (1 sensor per week)   Glucagon (GVOKE HYPOPEN 2-PACK) 0.5 MG/0.1ML SOAJ Inject 0.5 mg into the skin  daily as needed (for low blood sugar).   Glucagon (GVOKE HYPOPEN 2-PACK) 0.5 MG/0.1ML SOAJ Inject 0.5 mg into the skin daily as needed (for low blood sugar).   Glucagon 0.5 MG/0.1ML SOAJ Inject 0.5 mg into the skin daily. As needed (for low blood sugar).   insulin glargine (LANTUS SOLOSTAR) 100 UNIT/ML Solostar Pen Inject 25 Units into the skin in the morning. Replaces insulin NPH.    insulin NPH Human (NOVOLIN N) 100 UNIT/ML injection Inject 16 Units into the skin 2 (two) times daily before a meal.    insulin regular (NOVOLIN R) 100 units/mL injection Inject 0.1 mLs (10 Units total) into the skin 3 (three) times daily before meals. 1 unit per 8 grams of carbs.at meals and to correct high blood sugar.   Insulin Syringe-Needle U-100 (INSULIN SYRINGE .5CC/28G) 28G X 1/2" 0.5 ML MISC Use as instructed to administer insulin   levothyroxine (SYNTHROID) 125 MCG tablet Take 1 tablet (125 mcg total) by mouth daily. Replaces 100 mcg daily dosing.  Recheck thyroid levels 6-8 weeks after dose change   losartan (COZAAR) 25 MG tablet Take 1 tablet (25 mg total) by mouth daily. Keeps blood pressure lower and helps protects against kidney damage   OLANZapine (ZYPREXA) 5 MG tablet Take 1 tablet (5 mg total) by mouth at bedtime.   ondansetron (ZOFRAN-ODT) 4 MG disintegrating tablet Take 1 tablet (4 mg total) by mouth every 8 (eight) hours as needed for nausea or vomiting (for nausea from wegovy or other source).   Insulin Disposable Pump (OMNIPOD 5 G6 INTRO, GEN 5,) KIT 0.8 Units/hr by Does not apply route continuous. Using OmniPod along with Dexcom G6 to manage diabetes type 1 brittle please see Dr. For assistance with setting up but initial settings are planned to be 0.8 units/h continuous and 10-1 bolus ratio (Patient not taking: Reported on 01/22/2023)   Insulin Disposable Pump (OMNIPOD 5 G7 INTRO, GEN 5,) KIT 1 each by Does not apply route continuous. brittle type 1 diabetes mellitus retinopathy and history of level 3 hypoglycemia (glucose <54 mg/dL and altered mental  that resulted in hospital admission and intubation.   HGBA1C  11.3 01/15/2023 (Patient not taking: Reported on 01/22/2023)   No facility-administered medications prior to visit.         Clinical Data Analysis:   Physical Exam  BP 108/64 (BP Location: Left Arm)   Pulse 75   Temp 98.6 F (37 C) (Temporal)   Ht 5\' 2"  (1.575 m)    Wt 108 lb (49 kg)   SpO2 98%   BMI 19.75 kg/m  Wt Readings from Last 10 Encounters:  01/22/23 108 lb (49 kg)  01/15/23 104 lb 6.4 oz (47.4 kg)  07/15/22 100 lb 6.4 oz (45.5 kg)  07/15/22 100 lb 9.6 oz (45.6 kg)  06/25/22 100 lb 6.4 oz (45.5 kg)  06/18/22 95 lb 6.4 oz (43.3 kg)  06/11/22 98 lb (44.5 kg)  04/26/22 96 lb (43.5 kg)  10/02/21 101 lb (45.8 kg)  01/01/21 102 lb (46.3 kg)   Vital signs reviewed.  Nursing notes reviewed. Weight trend reviewed. Abnormalities and Problem-Specific physical exam findings:  great engagement.  Weight gain noted. General Appearance:  No acute distress appreciable.   Well-groomed, healthy-appearing female.  Well proportioned with no abnormal fat distribution.  Good muscle tone. Skin: Clear and well-hydrated. Pulmonary:  Normal work of breathing at rest, no respiratory distress apparent. SpO2: 98 %  Musculoskeletal: All extremities are intact.  Neurological:  Awake, alert, oriented,  and engaged.  No obvious focal neurological deficits or cognitive impairments.  Sensorium seems unclouded.   Speech is clear and coherent with logical content. Psychiatric:  Appropriate mood, pleasant and cooperative demeanor, thoughtful and engaged during the exam  Results Reviewed:    No results found for any visits on 01/22/23.  Recent Results (from the past 2160 hour(s))  CBC with Differential/Platelet     Status: Abnormal   Collection Time: 01/15/23  9:57 AM  Result Value Ref Range   WBC 4.8 4.0 - 10.5 K/uL   RBC 5.06 3.87 - 5.11 Mil/uL   Hemoglobin 14.4 12.0 - 15.0 g/dL   HCT 40.9 81.1 - 91.4 %   MCV 85.1 78.0 - 100.0 fl   MCHC 33.6 30.0 - 36.0 g/dL   RDW 78.2 95.6 - 21.3 %   Platelets 327.0 150.0 - 400.0 K/uL   Neutrophils Relative % 74.9 43.0 - 77.0 %   Lymphocytes Relative 13.4 12.0 - 46.0 %   Monocytes Relative 7.9 3.0 - 12.0 %   Eosinophils Relative 2.7 0.0 - 5.0 %   Basophils Relative 1.1 0.0 - 3.0 %   Neutro Abs 3.6 1.4 - 7.7 K/uL   Lymphs Abs  0.6 (L) 0.7 - 4.0 K/uL   Monocytes Absolute 0.4 0.1 - 1.0 K/uL   Eosinophils Absolute 0.1 0.0 - 0.7 K/uL   Basophils Absolute 0.1 0.0 - 0.1 K/uL  Comprehensive metabolic panel     Status: Abnormal   Collection Time: 01/15/23  9:57 AM  Result Value Ref Range   Sodium 140 135 - 145 mEq/L   Potassium 4.6 3.5 - 5.1 mEq/L   Chloride 102 96 - 112 mEq/L   CO2 30 19 - 32 mEq/L   Glucose, Bld 122 (H) 70 - 99 mg/dL   BUN 13 6 - 23 mg/dL   Creatinine, Ser 0.86 0.40 - 1.20 mg/dL   Total Bilirubin 0.5 0.2 - 1.2 mg/dL   Alkaline Phosphatase 76 39 - 117 U/L   AST 16 0 - 37 U/L   ALT 13 0 - 35 U/L   Total Protein 6.9 6.0 - 8.3 g/dL   Albumin 4.4 3.5 - 5.2 g/dL   GFR 57.84 >69.62 mL/min    Comment: Calculated using the CKD-EPI Creatinine Equation (2021)   Calcium 10.0 8.4 - 10.5 mg/dL  Lipid panel     Status: Abnormal   Collection Time: 01/15/23  9:57 AM  Result Value Ref Range   Cholesterol 177 0 - 200 mg/dL    Comment: ATP III Classification       Desirable:  < 200 mg/dL               Borderline High:  200 - 239 mg/dL          High:  > = 952 mg/dL   Triglycerides 84.1 0.0 - 149.0 mg/dL    Comment: Normal:  <324 mg/dLBorderline High:  150 - 199 mg/dL   HDL 40.10 >27.25 mg/dL   VLDL 36.6 0.0 - 44.0 mg/dL   LDL Cholesterol 347 (H) 0 - 99 mg/dL   Total CHOL/HDL Ratio 3     Comment:                Men          Women1/2 Average Risk     3.4          3.3Average Risk          5.0  4.42X Average Risk          9.6          7.13X Average Risk          15.0          11.0                       NonHDL 118.60     Comment: NOTE:  Non-HDL goal should be 30 mg/dL higher than patient's LDL goal (i.e. LDL goal of < 70 mg/dL, would have non-HDL goal of < 100 mg/dL)  Hemoglobin Z6X     Status: Abnormal   Collection Time: 01/15/23  9:57 AM  Result Value Ref Range   Hgb A1c MFr Bld 11.3 (H) 4.6 - 6.5 %    Comment: Glycemic Control Guidelines for People with Diabetes:Non Diabetic:  <6%Goal of Therapy:  <7%Additional Action Suggested:  >8%   Microalbumin / creatinine urine ratio     Status: Abnormal   Collection Time: 01/15/23  9:57 AM  Result Value Ref Range   Microalb, Ur 3.5 (H) 0.0 - 1.9 mg/dL   Creatinine,U 096.0 mg/dL   Microalb Creat Ratio 1.3 0.0 - 30.0 mg/g  TSH + free T4     Status: Abnormal   Collection Time: 01/15/23  9:57 AM  Result Value Ref Range   TSH W/REFLEX TO FT4 24.69 (H) mIU/L    Comment:           Reference Range .           > or = 20 Years  0.40-4.50 .                Pregnancy Ranges           First trimester    0.26-2.66           Second trimester   0.55-2.73           Third trimester    0.43-2.91   T4, free     Status: Abnormal   Collection Time: 01/15/23  9:57 AM  Result Value Ref Range   Free T4 0.6 (L) 0.8 - 1.8 ng/dL    No image results found.   No results found.     Signed: Lula Olszewski, MD 01/23/2023 5:12 PM

## 2023-01-23 NOTE — Assessment & Plan Note (Signed)
>>  ASSESSMENT AND PLAN FOR PURE HYPERCHOLESTEROLEMIA WRITTEN ON 01/23/2023  4:44 PM BY Gust Eugene G, MD  Severe statin intolerance- lifelong poorly controlled diabetes mellitus, high risk for cardiovascular disease- need to get Repatha  or praluent if possible.

## 2023-01-23 NOTE — Assessment & Plan Note (Signed)
Try to get jardiance to protect kidneys from uncontrolled diabetes mellitus.  Mild chronic kidney disease with microalbuminuria currentl,y

## 2023-01-23 NOTE — Assessment & Plan Note (Signed)
Documented this 01/23/2023 as part of cholesterol management plan.  She can not tolerate even low dose intermittent statin.

## 2023-01-23 NOTE — Assessment & Plan Note (Signed)
Severe statin intolerance- lifelong poorly controlled diabetes mellitus, high risk for cardiovascular disease- need to get Repatha or praluent if possible.

## 2023-01-24 LAB — URINE CULTURE
MICRO NUMBER:: 15001292
SPECIMEN QUALITY:: ADEQUATE

## 2023-01-25 ENCOUNTER — Other Ambulatory Visit: Payer: Self-pay | Admitting: Internal Medicine

## 2023-01-25 DIAGNOSIS — N10 Acute tubulo-interstitial nephritis: Secondary | ICD-10-CM

## 2023-01-25 MED ORDER — CEPHALEXIN 500 MG PO CAPS: 500.0000 mg | ORAL_CAPSULE | Freq: Three times a day (TID) | ORAL | 0 refills | Status: DC

## 2023-01-25 NOTE — Progress Notes (Signed)
Called patient- she can tolerate keflex to replace cipro due to bacteria resistance pattern.  Feels sick still but holding down fluids. Would like work excuse. Discussed emergency room option she will try to manage at home first. ------------------------------

## 2023-01-26 DIAGNOSIS — E10649 Type 1 diabetes mellitus with hypoglycemia without coma: Secondary | ICD-10-CM | POA: Diagnosis not present

## 2023-01-28 NOTE — Progress Notes (Signed)
  Chronic Care Management   Note  01/28/2023 Name: Karen Hayden MRN: [id number] DOB: 1975/09/26  Karen Hayden is a 47 y.o. year old female who is a primary care patient of Karen Olszewski, MD. I reached out to Karen Hayden by phone today in response to a referral sent by Karen Hayden.  The second contact attempt was unsuccessful.   Follow up plan: Additional outreach attempts will be made.  Karen Hayden, Karen Hayden [facility]  [place], [place] [address] Direct Dial: [phone] [email or web address]

## 2023-01-29 ENCOUNTER — Encounter: Payer: Self-pay | Admitting: Internal Medicine

## 2023-02-02 ENCOUNTER — Telehealth: Payer: Self-pay

## 2023-02-02 ENCOUNTER — Other Ambulatory Visit (HOSPITAL_COMMUNITY): Payer: Self-pay

## 2023-02-02 NOTE — Telephone Encounter (Signed)
Patient Advocate Encounter   Received notification from [facility] that prior authorization for Repatha is required.   PA submitted on 02/02/2023 Key [id number] Insurance [employer] Status is pending

## 2023-02-05 NOTE — Telephone Encounter (Signed)
PA APPROVED THROUGH 08/31/23  

## 2023-02-08 ENCOUNTER — Encounter: Payer: Self-pay | Admitting: Internal Medicine

## 2023-02-08 ENCOUNTER — Ambulatory Visit (INDEPENDENT_AMBULATORY_CARE_PROVIDER_SITE_OTHER): Payer: Medicare HMO | Admitting: Internal Medicine

## 2023-02-08 VITALS — BP 114/76 | HR 64 | Temp 98.2°F | Ht 62.0 in | Wt 107.2 lb

## 2023-02-08 DIAGNOSIS — Z794 Long term (current) use of insulin: Secondary | ICD-10-CM | POA: Diagnosis not present

## 2023-02-08 DIAGNOSIS — E119 Type 2 diabetes mellitus without complications: Secondary | ICD-10-CM

## 2023-02-08 DIAGNOSIS — K5900 Constipation, unspecified: Secondary | ICD-10-CM | POA: Diagnosis not present

## 2023-02-08 DIAGNOSIS — R69 Illness, unspecified: Secondary | ICD-10-CM | POA: Diagnosis not present

## 2023-02-08 DIAGNOSIS — Z7984 Long term (current) use of oral hypoglycemic drugs: Secondary | ICD-10-CM

## 2023-02-08 DIAGNOSIS — E039 Hypothyroidism, unspecified: Secondary | ICD-10-CM

## 2023-02-08 DIAGNOSIS — N12 Tubulo-interstitial nephritis, not specified as acute or chronic: Secondary | ICD-10-CM

## 2023-02-08 LAB — URINALYSIS, ROUTINE W REFLEX MICROSCOPIC
Bilirubin Urine: NEGATIVE
Hgb urine dipstick: NEGATIVE
Ketones, ur: NEGATIVE
Leukocytes,Ua: NEGATIVE
Nitrite: NEGATIVE
RBC / HPF: NONE SEEN (ref 0–?)
Specific Gravity, Urine: 1.01 (ref 1.000–1.030)
Total Protein, Urine: NEGATIVE
Urine Glucose: >=1000 — AB
Urobilinogen, UA: 0.2 (ref 0.0–1.0)
pH: 6.5 (ref 5.0–8.0)

## 2023-02-08 NOTE — Patient Instructions (Addendum)
VISIT SUMMARY:  During your visit, we discussed your concerns about your current medication regimen for diabetes and thyroid disease. You've been experiencing hypoglycemic episodes, particularly at night, and an increase in urinary frequency, which you believe may be due to your Jardiance medication. You've also been adjusting your Lantus insulin dosage in an attempt to manage these episodes. We also discussed your reluctance to use rapid-acting insulin as prescribed and your consideration of an insulin pump. Additionally, we talked about your thyroid disease and its impact on your bowel habits, as well as your concerns about a possible urinary tract infection.  YOUR PLAN:  -TYPE 2 DIABETES: We will reduce your Jardiance dose to 5mg  daily to help manage your symptoms. We'll also resume rapid-acting insulin with meals, starting with small doses, and continue your Lantus at 18 units daily. We'll consider insulin pump therapy to better control your blood sugar levels and reduce the number of injections you need. We'll also refer you to behavioral health for support with managing your chronic disease. We'll check your glucose control in 3 weeks.  -HYPOTHYROIDISM: You'll continue taking of levothyroxine daily. We'll check your thyroid function in 3 weeks to ensure the dosage is correct.  -URINARY TRACT INFECTIONS: We'll encourage you to increase your water intake to dilute urinary glucose and we'll collect a urine sample to check for a current urinary tract infection.  -CONSTIPATION: We'll continue your current management for constipation and check your thyroid function in 3 weeks to ensure your levothyroxine dosage is adequate.  INSTRUCTIONS:  Please remember to check your glucose levels frequently and adjust your insulin doses as needed. Also, remember to increase your water intake to help manage your urinary symptoms. We'll be checking your glucose control and thyroid function in 3 weeks, so  please continue to take your medications as prescribed until then. Next Steps:  [x]  Early Intervention: Schedule sooner appointment, call our on-call services, or go to emergency room if there is Increase in pain or discomfort New or worsening symptoms Sudden or severe changes in your health [x]  Flexible Follow-Up: We recommend a No follow-ups on file. for optimal routine care. This allows for progress monitoring and treatment adjustments. [x]  Preventive Care: Schedule your annual preventive care visit! It's typically covered by insurance and helps identify potential health issues early. [x]  Lab & X-ray Appointments: Incomplete tests scheduled today, or call to schedule. X-rays: [facility] (M-F, 8:30am-noon or 1pm-5pm). [x]  Medical Information Release: Sign a release form at front desk to obtain relevant medical information we don't have.  Making the Most of Our Focused (20 minute) Appointments:  [x]   Clearly state your top concerns at the beginning of the visit to focus our discussion [x]   If you anticipate you will need more time, please inform the front desk during scheduling - we can book multiple appointments in the same week. [x]   If you have transportation problems- use our convenient video appointments or ask about transportation support. [x]   We can get down to business faster if you use MyChart to update information before the visit and submit non-urgent questions before your visit. Thank you for taking the time to provide details through MyChart.  Let our nurse know and she can import this information into your encounter documents.  Arrival and Wait Times: [x]   Arriving on time ensures that everyone receives prompt attention. [x]   Early morning (8a) and afternoon (1p) appointments tend to have shortest wait times. [x]   Unfortunately, we cannot delay appointments  for late arrivals or hold slots during phone calls.  Getting Answers and Following Up  [x]   Simple  Questions & Concerns: For quick questions or basic follow-up after your visit, reach [place] at [phone] or MyChart messaging. [x]   Complex Concerns: If your concern is more complex, scheduling an appointment might be best. Discuss this with the staff to find the most suitable option. [x]   Lab & Imaging Results: We'll contact you directly if results are abnormal or you don't use MyChart. Most normal results will be on MyChart within 2-3 business days, with a review message from Dr. Jon Billings. Haven't heard back in 2 weeks? Need results sooner? Contact us at [phone]. [x]   Referrals: Our referral coordinator will manage specialist referrals. The specialist's office should contact you within 2 weeks to schedule an appointment. Call us if you haven't heard from them after 2 weeks.  Staying Connected  [x]   MyChart: Activate your MyChart for the fastest way to access results and message [place]. See the last page of this paperwork for instructions on how to activate.  Bring to Your Next Appointment  [x]   Medications: Please bring all your medication bottles to your next appointment to ensure we have an accurate record of your prescriptions. [x]   Health Diaries: If you're monitoring any health conditions at home, keeping a diary of your readings can be very helpful for discussions at your next appointment.  Billing  [x]   X-ray & Lab Orders: These are billed by separate companies. Contact the invoicing company directly for questions or concerns. [x]   Visit Charges: Discuss any billing inquiries with our administrative services team.  Your Satisfaction Matters  [x]   Share Your Experience: We strive for your satisfaction! If you have any complaints, or preferably compliments, please let Dr. Jon Billings know directly or contact our Practice Administrators, Edwena Felty or [employer], by asking at the front desk.   Reviewing Your Records  [x]   Review this early draft of your clinical encounter notes  below and the final encounter summary tomorrow on MyChart after its been completed.   Pyelonephritis -     Urinalysis, Routine w reflex microscopic  Chronic disease /disorder -     Ambulatory referral to Psychology

## 2023-02-08 NOTE — Progress Notes (Signed)
[facility]: [phone]   Routine Medical Office Visit  Patient:  Karen Hayden      Age: 47 y.o.       Sex:  female  Date:   02/08/2023 PCP:    Karen Olszewski, MD   Today's Healthcare Provider: Lula Olszewski, MD   Assessment and Plan:   AI-Extracted Assessment and Plan    Type 2 Diabetes Mellitus: She is currently on Jardiance and Lantus but reports frequent urination and has been adjusting her Lantus dose independently, not regularly taking rapid-acting insulin due to injection burden. Her glucose control is suboptimal with 52-56% time in range over the past 14 days. We will reduce Jardiance dose to 5mg  daily, resume rapid-acting insulin with meals starting with small doses, and continue Lantus at 18 units daily. She should check glucose levels frequently and adjust insulin doses as needed. We will consider insulin pump therapy for better glucose control and reduced injection burden and refer her to behavioral health for support with chronic disease management. We will check glucose control in 3 weeks.  Hypothyroidism: She had a recent increase in levothyroxine dose from to due to low thyroid hormone levels. She will continue levothyroxine daily and check thyroid function in 3 weeks.  Urinary Tract Infections: She has a history of UTIs, possibly related to Jardiance use. We will encourage increased water intake to dilute urinary glucose and collect a urine sample to check for a current UTI.  Constipation: This is possibly related to hypothyroidism. She will continue current management and check thyroid function in 3 weeks to ensure adequate levothyroxine dosing.     Follow up 3 weeks.   Charting-Extracted Assessment and Plan Karen Hayden was seen today for 2 week follow-up.  Pyelonephritis -     Urinalysis, Routine w reflex microscopic  Chronic disease /disorder -     Ambulatory referral to Psychology    Treatment plan discussed and  reviewed in detail. Explained medication safety and potential side effects.  Answered all patient questions and confirmed understanding and comfort with the plan. Encouraged patient to contact our office if they have any questions or concerns. Agreed on patient returning to office if symptoms worsen, persist, or new symptoms develop.        Clinical Presentation:    47 y.o. female here today for 2 week follow-up  Continuous glucose monitor associated brittle diabetes management - she tests using receiver.  Has been controlled,  7 day is 56% in range but 1% very low.  14 days.  Not using much rapid acting insulin - doesn't like to take shots when she eats. History of getting burned out on pump but plans to restart on pump Reports she does have tiny needles and pens.  AI-Extracted: Discussed the use of AI scribe software for clinical note transcription with the patient, who gave verbal consent to proceed.  History of Present Illness   The patient, with a complex medical history including diabetes, presents with concerns about her current medication regimen. She reports experiencing hypoglycemic episodes, particularly during the night, which she attributes to her 10mg  dose of Jardiance. She also notes an increase in urinary frequency, a known side effect of Jardiance, and expresses concern about the risk of urinary tract infections.  In an attempt to manage her nocturnal hypoglycemia, the patient has self-adjusted her Lantus insulin dosage, reducing it from 25 units to 20 units, and then further to 18 units. Despite these adjustments, she continues to  experience hypoglycemic episodes.  The patient is also using a transceiver to monitor her blood glucose levels. Over the past 14 days, she reports that her blood glucose has been within the target range 53% of the time, with 1% of readings being very low.  The patient admits to not using her rapid-acting insulin as prescribed, citing a reluctance to  take additional injections. She expresses a desire to improve her diabetes management and is considering the use of an insulin pump.  In addition to her diabetes, the patient also has a history of thyroid disease. She reports that her bowel habits fluctuate depending on her thyroid hormone levels, with constipation when her thyroid levels are low and diarrhea when they are high. She is currently taking of thyroid medication daily.  The patient also reports flank pain and frequent urination, raising concerns about a possible urinary tract infection. She has been prescribed a three-month supply of Jardiance, which she is considering halving to extend the duration of the prescription and potentially reduce side effects.     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   Acne 06/18/2022   Hereditary and idiopathic neuropathy, unspecified 06/18/2022   Hypertension 06/18/2022   Joint pain 06/18/2022   Pure hypercholesterolemia 06/18/2022   Retinal tear 01/15/2023   Long term current use of oral hypoglycemic drug 01/16/2023   Insulin long-term use (HCC) 01/16/2023   Insulin pump status 01/16/2023   Chronic kidney disease, stage 2 (mild) 01/22/2023   Statin intolerance 01/23/2023   Mixed hyperlipidemia 01/23/2023   Resolved Ambulatory Problems    Diagnosis Date Noted   Acute bronchitis 01/04/2009   Left sided abdominal pain 06/21/2013    Gastroparesis 06/21/2013   Nausea with vomiting 06/21/2013   Other general symptoms(780.99)  06/21/2013   Nausea & vomiting 01/26/2014   Diabetic ketoacidosis without coma associated with type 1 diabetes mellitus (HCC)    Chest pain 02/17/2018   Unstable angina (HCC)    Acute posttraumatic stress disorder 07/06/2018   Nausea & vomiting 05/03/2019   ARF (acute renal failure) (HCC) 05/03/2019   Unintentional weight loss 06/11/2022   Diarrhea 01/22/2023   Past Medical History:  Diagnosis Date   Anxiety    Chronic kidney infection    Diabetes mellitus type I (HCC)    DM (diabetes mellitus) (HCC)    Hyperlipidemia    Seizure disorder (HCC)    Umbilical hernia     Outpatient Medications Prior to Visit  Medication Sig   cephALEXin (KEFLEX) 500 MG capsule Take 1 capsule (500 mg total) by mouth 3 (three) times daily.   Continuous Blood Gluc Transmit (DEXCOM G6 TRANSMITTER) MISC Please provide once every 3 months   Continuous Glucose Receiver (DEXCOM G7 RECEIVER) DEVI Please provide 1 dexcom g7 receiver   Continuous Glucose Sensor (DEXCOM G7 SENSOR) MISC Apply sensor every 10 days. Pharmacy please provide 3 boxes (1 sensor per week)   empagliflozin (JARDIANCE) 10 MG TABS tablet Take 1 tablet (10 mg  total) by mouth daily before breakfast.   Evolocumab (REPATHA SURECLICK) 140 MG/ML SOAJ Inject 140 mg into the skin every 14 (fourteen) days.   Glucagon (GVOKE HYPOPEN 2-PACK) 0.5 MG/0.1ML SOAJ Inject 0.5 mg into the skin daily as needed (for low blood sugar).   Glucagon (GVOKE HYPOPEN 2-PACK) 0.5 MG/0.1ML SOAJ Inject 0.5 mg into the skin daily as needed (for low blood sugar).   Glucagon 0.5 MG/0.1ML SOAJ Inject 0.5 mg into the skin daily. As needed (for low blood sugar).   Insulin Disposable Pump (OMNIPOD 5 G6 INTRO, GEN 5,) KIT 0.8 Units/hr by Does not apply route continuous. Using OmniPod along with Dexcom G6 to manage diabetes type 1 brittle please see Dr. For assistance with setting up but  initial settings are planned to be 0.8 units/h continuous and 10-1 bolus ratio   Insulin Disposable Pump (OMNIPOD 5 G7 INTRO, GEN 5,) KIT 1 each by Does not apply route continuous. brittle type 1 diabetes mellitus retinopathy and history of level 3 hypoglycemia (glucose <54 mg/dL and altered mental  that resulted in hospital admission and intubation.   HGBA1C  11.3 01/15/2023   insulin glargine (LANTUS SOLOSTAR) 100 UNIT/ML Solostar Pen Inject 25 Units into the skin in the morning. Replaces insulin NPH.   insulin NPH Human (NOVOLIN N) 100 UNIT/ML injection Inject 16 Units into the skin 2 (two) times daily before a meal.    insulin regular (NOVOLIN R) 100 units/mL injection Inject 0.1 mLs (10 Units total) into the skin 3 (three) times daily before meals. 1 unit per 8 grams of carbs.at meals and to correct high blood sugar.   Insulin Syringe-Needle U-100 (INSULIN SYRINGE .5CC/28G) 28G X 1/2" 0.5 ML MISC Use as instructed to administer insulin   levothyroxine (SYNTHROID) 125 MCG tablet Take 1 tablet (125 mcg total) by mouth daily. Replaces 100 mcg daily dosing.  Recheck thyroid levels 6-8 weeks after dose change   losartan (COZAAR) 25 MG tablet Take 1 tablet (25 mg total) by mouth daily. Keeps blood pressure lower and helps protects against kidney damage   OLANZapine (ZYPREXA) 5 MG tablet Take 1 tablet (5 mg total) by mouth at bedtime.   ondansetron (ZOFRAN-ODT) 4 MG disintegrating tablet Take 1 tablet (4 mg total) by mouth every 8 (eight) hours as needed for nausea or vomiting (for nausea from wegovy or other source).   No facility-administered medications prior to visit.         Clinical Data Analysis:   Physical Exam  BP 114/76   Pulse 64   Temp 98.2 F (36.8 C) (Temporal)   Ht 5\' 2"  (1.575 m)   Wt 107 lb 3.2 oz (48.6 kg)   SpO2 99%   BMI 19.61 kg/m  Wt Readings from Last 10 Encounters:  02/08/23 107 lb 3.2 oz (48.6 kg)  01/22/23 108 lb (49 kg)  01/15/23 104 lb 6.4 oz (47.4 kg)   07/15/22 100 lb 6.4 oz (45.5 kg)  07/15/22 100 lb 9.6 oz (45.6 kg)  06/25/22 100 lb 6.4 oz (45.5 kg)  06/18/22 95 lb 6.4 oz (43.3 kg)  06/11/22 98 lb (44.5 kg)  04/26/22 96 lb (43.5 kg)  10/02/21 101 lb (45.8 kg)   Vital signs reviewed.  Nursing notes reviewed. Weight trend reviewed. Abnormalities and Problem-Specific physical exam findings:  presents today with supportive daughter.  General Appearance:  No acute distress appreciable.   Well-groomed, healthy-appearing female.  Well proportioned with no abnormal fat distribution.  Good muscle tone. Skin: Clear and well-hydrated.  Pulmonary:  Normal work of breathing at rest, no respiratory distress apparent. SpO2: 99 %  Musculoskeletal: All extremities are intact.  Neurological:  Awake, alert, oriented, and engaged.  No obvious focal neurological deficits or cognitive impairments.  Sensorium seems unclouded.   Speech is clear and coherent with logical content. Psychiatric:  Appropriate mood, pleasant and cooperative demeanor, thoughtful and engaged during the exam  Results Reviewed:    No results found for any visits on 02/08/23.  Recent Results (from the past 2160 hour(s))  CBC with Differential/Platelet     Status: Abnormal   Collection Time: 01/15/23  9:57 AM  Result Value Ref Range   WBC 4.8 4.0 - 10.5 K/uL   RBC 5.06 3.87 - 5.11 Mil/uL   Hemoglobin 14.4 12.0 - 15.0 g/dL   HCT 78.4 69.6 - 29.5 %   MCV 85.1 78.0 - 100.0 fl   MCHC 33.6 30.0 - 36.0 g/dL   RDW 28.4 13.2 - 44.0 %   Platelets 327.0 150.0 - 400.0 K/uL   Neutrophils Relative % 74.9 43.0 - 77.0 %   Lymphocytes Relative 13.4 12.0 - 46.0 %   Monocytes Relative 7.9 3.0 - 12.0 %   Eosinophils Relative 2.7 0.0 - 5.0 %   Basophils Relative 1.1 0.0 - 3.0 %   Neutro Abs 3.6 1.4 - 7.7 K/uL   Lymphs Abs 0.6 (L) 0.7 - 4.0 K/uL   Monocytes Absolute 0.4 0.1 - 1.0 K/uL   Eosinophils Absolute 0.1 0.0 - 0.7 K/uL   Basophils Absolute 0.1 0.0 - 0.1 K/uL  Comprehensive metabolic  panel     Status: Abnormal   Collection Time: 01/15/23  9:57 AM  Result Value Ref Range   Sodium 140 135 - 145 mEq/L   Potassium 4.6 3.5 - 5.1 mEq/L   Chloride 102 96 - 112 mEq/L   CO2 30 19 - 32 mEq/L   Glucose, Bld 122 (H) 70 - 99 mg/dL   BUN 13 6 - 23 mg/dL   Creatinine, Ser 1.02 0.40 - 1.20 mg/dL   Total Bilirubin 0.5 0.2 - 1.2 mg/dL   Alkaline Phosphatase 76 39 - 117 U/L   AST 16 0 - 37 U/L   ALT 13 0 - 35 U/L   Total Protein 6.9 6.0 - 8.3 g/dL   Albumin 4.4 3.5 - 5.2 g/dL   GFR 72.53 >66.44 mL/min    Comment: Calculated using the CKD-EPI Creatinine Equation (2021)   Calcium 10.0 8.4 - 10.5 mg/dL  Lipid panel     Status: Abnormal   Collection Time: 01/15/23  9:57 AM  Result Value Ref Range   Cholesterol 177 0 - 200 mg/dL    Comment: ATP III Classification       Desirable:  < 200 mg/dL               Borderline High:  200 - 239 mg/dL          High:  > = 034 mg/dL   Triglycerides 74.2 0.0 - 149.0 mg/dL    Comment: Normal:  <595 mg/dLBorderline High:  150 - 199 mg/dL   HDL 63.87 >56.43 mg/dL   VLDL 32.9 0.0 - 51.8 mg/dL   LDL Cholesterol 841 (H) 0 - 99 mg/dL   Total CHOL/HDL Ratio 3     Comment:                Men          Women1/2 Average Risk     3.4  3.3Average Risk          5.0          4.42X Average Risk          9.6          7.13X Average Risk          15.0          11.0                       NonHDL 118.60     Comment: NOTE:  Non-HDL goal should be 30 mg/dL higher than patient's LDL goal (i.e. LDL goal of < 70 mg/dL, would have non-HDL goal of < 100 mg/dL)  Hemoglobin W0J     Status: Abnormal   Collection Time: 01/15/23  9:57 AM  Result Value Ref Range   Hgb A1c MFr Bld 11.3 (H) 4.6 - 6.5 %    Comment: Glycemic Control Guidelines for People with Diabetes:Non Diabetic:  <6%Goal of Therapy: <7%Additional Action Suggested:  >8%   Microalbumin / creatinine urine ratio     Status: Abnormal   Collection Time: 01/15/23  9:57 AM  Result Value Ref Range   Microalb, Ur  3.5 (H) 0.0 - 1.9 mg/dL   Creatinine,U 811.9 mg/dL   Microalb Creat Ratio 1.3 0.0 - 30.0 mg/g  TSH + free T4     Status: Abnormal   Collection Time: 01/15/23  9:57 AM  Result Value Ref Range   TSH W/REFLEX TO FT4 24.69 (H) mIU/L    Comment:           Reference Range .           > or = 20 Years  0.40-4.50 .                Pregnancy Ranges           First trimester    0.26-2.66           Second trimester   0.55-2.73           Third trimester    0.43-2.91   T4, free     Status: Abnormal   Collection Time: 01/15/23  9:57 AM  Result Value Ref Range   Free T4 0.6 (L) 0.8 - 1.8 ng/dL  Urine Culture     Status: Abnormal   Collection Time: 01/22/23  2:35 PM   Specimen: Urine  Result Value Ref Range   MICRO NUMBER: [id number]    SPECIMEN QUALITY: Adequate    Sample Source URINE    STATUS: FINAL    ISOLATE 1: Escherichia coli (A)     Comment: 10,000-49,000 CFU/mL of Escherichia coli   ISOLATE 2: Streptococcus agalactiae (A)     Comment: 10,000-49,000 CFU/mL of Group B Streptococcus isolated Beta-hemolytic streptococci are predictably susceptible to Penicillin and other beta-lactams. Susceptibility testing not routinely performed. Please contact the laboratory within 3 days if  susceptibility testing is desired. Erythromycin and clindamycin are not recommended for treatment of urinary tract infections, but clindamycin may be useful for treatment of rectovaginal colonization or infection. Any amount of group B Streptococcus in  urine specimens obtained from pregnant females is a marker of genital tract colonization. If this patient is pregnant, please refer to ACOG guidelines for appropriate screening and management of pregnant women.       Susceptibility   Escherichia coli - URINE CULTURE, REFLEX    AMOX/CLAVULANIC <=2 Sensitive     AMPICILLIN <=2 Sensitive  AMPICILLIN/SULBACTAM <=2 Sensitive     CEFAZOLIN* <=4 Not Reportable      * For infections other than uncomplicated UTI caused by  E. coli, K. pneumoniae or P. mirabilis: Cefazolin is resistant if MIC > or = 8 mcg/mL. (Distinguishing susceptible versus intermediate for isolates with MIC < or = 4 mcg/mL requires additional testing.) For uncomplicated UTI caused by E. coli, K. pneumoniae or P. mirabilis: Cefazolin is susceptible if MIC <32 mcg/mL and predicts susceptible to the oral agents cefaclor, cefdinir, cefpodoxime, cefprozil, cefuroxime, cephalexin and loracarbef.     CEFTAZIDIME <=1 Sensitive     CEFEPIME <=1 Sensitive     CEFTRIAXONE <=1 Sensitive     CIPROFLOXACIN >=4 Resistant     LEVOFLOXACIN >=8 Resistant     GENTAMICIN <=1 Sensitive     IMIPENEM <=0.25 Sensitive     NITROFURANTOIN <=16 Sensitive     PIP/TAZO <=4 Sensitive     TOBRAMYCIN <=1 Sensitive     TRIMETH/SULFA* >=320 Resistant      * For infections other than uncomplicated UTI caused by E. coli, K. pneumoniae or P. mirabilis: Cefazolin is resistant if MIC > or = 8 mcg/mL. (Distinguishing susceptible versus intermediate for isolates with MIC < or = 4 mcg/mL requires additional testing.) For uncomplicated UTI caused by E. coli, K. pneumoniae or P. mirabilis: Cefazolin is susceptible if MIC <32 mcg/mL and predicts susceptible to the oral agents cefaclor, cefdinir, cefpodoxime, cefprozil, cefuroxime, cephalexin and loracarbef. Legend: S = Susceptible  I = Intermediate R = Resistant  NS = Not susceptible * = Not tested  NR = Not reported **NN = See antimicrobic comments     No image results found.   No results found.     Signed: Lula Olszewski, MD 02/08/2023 12:58 PM

## 2023-02-15 ENCOUNTER — Ambulatory Visit (INDEPENDENT_AMBULATORY_CARE_PROVIDER_SITE_OTHER): Payer: Medicare HMO

## 2023-02-15 VITALS — Wt 107.0 lb

## 2023-02-15 DIAGNOSIS — Z0000 Encounter for general adult medical examination without abnormal findings: Secondary | ICD-10-CM

## 2023-02-15 NOTE — Patient Instructions (Signed)
Karen Hayden , Thank you for taking time to come for your Medicare Wellness Visit. I appreciate your ongoing commitment to your health goals. Please review the following plan we discussed and let me know if I can assist you in the future.   These are the goals we discussed:  Goals      CCM Expected Outcome:  Monitor, Self-Manage and Reduce Symptoms of:     Patient Stated     Lower A1C and protect kidney         This is a list of the screening recommended for you and due dates:  Health Maintenance  Topic Date Due   Eye exam for diabetics  Never done   Complete foot exam   03/25/2023*   Colon Cancer Screening  06/12/2023*   Flu Shot  04/01/2023   Hemoglobin A1C  07/18/2023   DTaP/Tdap/Td vaccine (2 - Td or Tdap) 08/12/2023   Yearly kidney function blood test for diabetes  01/15/2024   Yearly kidney health urinalysis for diabetes  01/15/2024   Medicare Annual Wellness Visit  02/15/2024   Hepatitis C Screening  Completed   HIV Screening  Completed   HPV Vaccine  Aged Out   Pap Smear  Discontinued   COVID-19 Vaccine  Discontinued  *Topic was postponed. The date shown is not the original due date.    Advanced directives: Advance directive discussed with you today. Even though you declined this today please call our office should you change your mind and we can give you the proper paperwork for you to fill out.  Conditions/risks identified: keep A1C down and better kidney health   Next appointment: Follow up in one year for your annual wellness visit.   Preventive Care 40-64 Years, Female Preventive care refers to lifestyle choices and visits with your health care provider that can promote health and wellness. What does preventive care include? A yearly physical exam. This is also called an annual well check. Dental exams once or twice a year. Routine eye exams. Ask your health care provider how often you should have your eyes checked. Personal lifestyle choices, including: Daily  care of your teeth and gums. Regular physical activity. Eating a healthy diet. Avoiding tobacco and drug use. Limiting alcohol use. Practicing safe sex. Taking low-dose aspirin daily starting at age 67. Taking vitamin and mineral supplements as recommended by your health care provider. What happens during an annual well check? The services and screenings done by your health care provider during your annual well check will depend on your age, overall health, lifestyle risk factors, and family history of disease. Counseling  Your health care provider may ask you questions about your: Alcohol use. Tobacco use. Drug use. Emotional well-being. Home and relationship well-being. Sexual activity. Eating habits. Work and work [employer]. Method of birth control. Menstrual cycle. Pregnancy history. Screening  You may have the following tests or measurements: Height, weight, and BMI. Blood pressure. Lipid and cholesterol levels. These may be checked every 5 years, or more frequently if you are over 79 years old. Skin check. Lung cancer screening. You may have this screening every year starting at age 70 if you have a 30-pack-year history of smoking and currently smoke or have quit within the past 15 years. Fecal occult blood test (FOBT) of the stool. You may have this test every year starting at age 36. Flexible sigmoidoscopy or colonoscopy. You may have a sigmoidoscopy every 5 years or a colonoscopy every 10 years starting at age 77.  Hepatitis C blood test. Hepatitis B blood test. Sexually transmitted disease (STD) testing. Diabetes screening. This is done by checking your blood sugar (glucose) after you have not eaten for a while (fasting). You may have this done every 1-3 years. Mammogram. This may be done every 1-2 years. Talk to your health care provider about when you should start having regular mammograms. This may depend on whether you have a family history of breast  cancer. BRCA-related cancer screening. This may be done if you have a family history of breast, ovarian, tubal, or peritoneal cancers. Pelvic exam and Pap test. This may be done every 3 years starting at age 4. Starting at age 38, this may be done every 5 years if you have a Pap test in combination with an HPV test. Bone density scan. This is done to screen for osteoporosis. You may have this scan if you are at high risk for osteoporosis. Discuss your test results, treatment options, and if necessary, the need for more tests with your health care provider. Vaccines  Your health care provider may recommend certain vaccines, such as: Influenza vaccine. This is recommended every year. Tetanus, diphtheria, and acellular pertussis (Tdap, Td) vaccine. You may need a Td booster every 10 years. Zoster vaccine. You may need this after age 38. Pneumococcal 13-valent conjugate (PCV13) vaccine. You may need this if you have certain conditions and were not previously vaccinated. Pneumococcal polysaccharide (PPSV23) vaccine. You may need one or two doses if you smoke cigarettes or if you have certain conditions. Talk to your health care provider about which screenings and vaccines you need and how often you need them. This information is not intended to replace advice given to you by your health care provider. Make sure you discuss any questions you have with your health care provider. Document Released: 09/13/2015 Document Revised: 05/06/2016 Document Reviewed: 06/18/2015 Elsevier Interactive Patient Education  2017 [employer].    Fall Prevention in the Home Falls can cause injuries. They can happen to people of all ages. There are many things you can do to make your home safe and to help prevent falls. What can I do on the outside of my home? Regularly fix the edges of walkways and driveways and fix any cracks. Remove anything that might make you trip as you walk through a door, such as a raised step  or threshold. Trim any bushes or trees on the path to your home. Use bright outdoor lighting. Clear any walking paths of anything that might make someone trip, such as rocks or tools. Regularly check to see if handrails are loose or broken. Make sure that both sides of any steps have handrails. Any raised decks and porches should have guardrails on the edges. Have any leaves, snow, or ice cleared regularly. Use sand or salt on walking paths during winter. Clean up any spills in your garage right away. This includes oil or grease spills. What can I do in the bathroom? Use night lights. Install grab bars by the toilet and in the tub and shower. Do not use towel bars as grab bars. Use non-skid mats or decals in the tub or shower. If you need to sit down in the shower, use a plastic, non-slip stool. Keep the floor dry. Clean up any water that spills on the floor as soon as it happens. Remove soap buildup in the tub or shower regularly. Attach bath mats securely with double-sided non-slip rug tape. Do not have throw rugs and other things  on the floor that can make you trip. What can I do in the bedroom? Use night lights. Make sure that you have a light by your bed that is easy to reach. Do not use any sheets or blankets that are too big for your bed. They should not hang down onto the floor. Have a firm chair that has side arms. You can use this for support while you get dressed. Do not have throw rugs and other things on the floor that can make you trip. What can I do in the kitchen? Clean up any spills right away. Avoid walking on wet floors. Keep items that you use a lot in easy-to-reach places. If you need to reach something above you, use a strong step stool that has a grab bar. Keep electrical cords out of the way. Do not use floor polish or wax that makes floors slippery. If you must use wax, use non-skid floor wax. Do not have throw rugs and other things on the floor that can make  you trip. What can I do with my stairs? Do not leave any items on the stairs. Make sure that there are handrails on both sides of the stairs and use them. Fix handrails that are broken or loose. Make sure that handrails are as long as the stairways. Check any carpeting to make sure that it is firmly attached to the stairs. Fix any carpet that is loose or worn. Avoid having throw rugs at the top or bottom of the stairs. If you do have throw rugs, attach them to the floor with carpet tape. Make sure that you have a light switch at the top of the stairs and the bottom of the stairs. If you do not have them, ask someone to add them for you. What else can I do to help prevent falls? Wear shoes that: Do not have high heels. Have rubber bottoms. Are comfortable and fit you well. Are closed at the toe. Do not wear sandals. If you use a stepladder: Make sure that it is fully opened. Do not climb a closed stepladder. Make sure that both sides of the stepladder are locked into place. Ask someone to hold it for you, if possible. Clearly mark and make sure that you can see: Any grab bars or handrails. First and last steps. Where the edge of each step is. Use tools that help you move around (mobility aids) if they are needed. These include: Canes. Walkers. Scooters. Crutches. Turn on the lights when you go into a dark area. Replace any light bulbs as soon as they burn out. Set up your furniture so you have a clear path. Avoid moving your furniture around. If any of your floors are uneven, fix them. If there are any pets around you, be aware of where they are. Review your medicines with your doctor. Some medicines can make you feel dizzy. This can increase your chance of falling. Ask your doctor what other things that you can do to help prevent falls. This information is not intended to replace advice given to you by your health care provider. Make sure you discuss any questions you have with your  health care provider. Document Released: 06/13/2009 Document Revised: 01/23/2016 Document Reviewed: 09/21/2014 Elsevier Interactive Patient Education  2017 [employer].

## 2023-02-15 NOTE — Progress Notes (Signed)
I have personally reviewed the Medicare Annual Wellness Visit by Lanier Ensign and agree with the documentation.  Lula Olszewski, MD  02/15/2023 1:39 PM

## 2023-02-15 NOTE — Progress Notes (Signed)
I connected with  Karen Hayden on 02/15/23 by a audio enabled telemedicine application and verified that I am speaking with the correct person using two identifiers.  Patient Location: Home  Provider Location: Office/Clinic  I discussed the limitations of evaluation and management by telemedicine. The patient expressed understanding and agreed to proceed.   Subjective:   Karen Hayden is a 47 y.o. female who presents for an Initial Medicare Annual Wellness Visit.  Review of Systems     Cardiac Risk Factors include: diabetes mellitus;dyslipidemia;hypertension;smoking/ tobacco exposure;Other (see comment) (vaping)     Objective:    Today's Vitals   02/15/23 1059  Weight: 107 lb (48.5 kg)   Body mass index is 19.57 kg/m.     02/15/2023   11:09 AM 07/15/2022    1:25 PM 04/26/2022    7:32 PM 10/02/2021   12:47 PM 01/01/2021    6:06 PM 05/04/2019   12:54 PM 05/03/2019    1:05 PM  Advanced Directives  Does Patient Have a Medical Advance Directive? No No No No No No No  Would patient like information on creating a medical advance directive? No - Patient declined   No - Patient declined  No - Patient declined     Current Medications (verified) Outpatient Encounter Medications as of 02/15/2023  Medication Sig   Continuous Glucose Sensor (DEXCOM G7 SENSOR) MISC Apply sensor every 10 days. Pharmacy please provide 3 boxes (1 sensor per week)   empagliflozin (JARDIANCE) 10 MG TABS tablet Take 1 tablet (10 mg total) by mouth daily before breakfast.   Evolocumab (REPATHA SURECLICK) 140 MG/ML SOAJ Inject 140 mg into the skin every 14 (fourteen) days.   Glucagon (GVOKE HYPOPEN 2-PACK) 0.5 MG/0.1ML SOAJ Inject 0.5 mg into the skin daily as needed (for low blood sugar).   insulin glargine (LANTUS SOLOSTAR) 100 UNIT/ML Solostar Pen Inject 25 Units into the skin in the morning. Replaces insulin NPH.   insulin NPH Human (NOVOLIN N) 100 UNIT/ML injection Inject 16 Units into the skin 2 (two) times daily  before a meal.    insulin regular (NOVOLIN R) 100 units/mL injection Inject 0.1 mLs (10 Units total) into the skin 3 (three) times daily before meals. 1 unit per 8 grams of carbs.at meals and to correct high blood sugar.   Insulin Syringe-Needle U-100 (INSULIN SYRINGE .5CC/28G) 28G X 1/2" 0.5 ML MISC Use as instructed to administer insulin   levothyroxine (SYNTHROID) 125 MCG tablet Take 1 tablet (125 mcg total) by mouth daily. Replaces 100 mcg daily dosing.  Recheck thyroid levels 6-8 weeks after dose change   losartan (COZAAR) 25 MG tablet Take 1 tablet (25 mg total) by mouth daily. Keeps blood pressure lower and helps protects against kidney damage   OLANZapine (ZYPREXA) 5 MG tablet Take 1 tablet (5 mg total) by mouth at bedtime.   ondansetron (ZOFRAN-ODT) 4 MG disintegrating tablet Take 1 tablet (4 mg total) by mouth every 8 (eight) hours as needed for nausea or vomiting (for nausea from wegovy or other source).   Insulin Disposable Pump (OMNIPOD 5 G6 INTRO, GEN 5,) KIT 0.8 Units/hr by Does not apply route continuous. Using OmniPod along with Dexcom G6 to manage diabetes type 1 brittle please see Dr. For assistance with setting up but initial settings are planned to be 0.8 units/h continuous and 10-1 bolus ratio (Patient not taking: Reported on 02/15/2023)   Insulin Disposable Pump (OMNIPOD 5 G7 INTRO, GEN 5,) KIT 1 each by Does not apply route continuous.  brittle type 1 diabetes mellitus retinopathy and history of level 3 hypoglycemia (glucose <54 mg/dL and altered mental  that resulted in hospital admission and intubation.   HGBA1C  11.3 01/15/2023 (Patient not taking: Reported on 02/15/2023)   [DISCONTINUED] cephALEXin (KEFLEX) 500 MG capsule Take 1 capsule (500 mg total) by mouth 3 (three) times daily.   [DISCONTINUED] Continuous Blood Gluc Transmit (DEXCOM G6 TRANSMITTER) MISC Please provide once every 3 months   [DISCONTINUED] Continuous Glucose Receiver (DEXCOM G7 RECEIVER) DEVI Please provide 1  dexcom g7 receiver   [DISCONTINUED] Glucagon (GVOKE HYPOPEN 2-PACK) 0.5 MG/0.1ML SOAJ Inject 0.5 mg into the skin daily as needed (for low blood sugar).   [DISCONTINUED] Glucagon 0.5 MG/0.1ML SOAJ Inject 0.5 mg into the skin daily. As needed (for low blood sugar).   No facility-administered encounter medications on file as of 02/15/2023.    Allergies (verified) Seroquel [quetiapine fumarate], Isosorbide nitrate, Lipitor [atorvastatin], Codeine, and Isosorbide   History: Past Medical History:  Diagnosis Date   Acute bronchitis 01/04/2009   Qualifier: Diagnosis of   By: Cathren Harsh MD, Stephen       Acute posttraumatic stress disorder 07/06/2018   Anxiety    ARF (acute renal failure) (HCC) 05/03/2019   Lab Results  Component  Value  Date/Time     CREATININE  0.84  01/15/2023 09:57 AM     CREATININE  0.78  07/15/2022 02:09 PM     CREATININE  0.82  06/11/2022 03:16 PM     CREATININE  0.91  04/26/2022 07:50 PM     CREATININE  1.01 (H)  01/01/2021 06:20 PM         Bipolar 1 disorder (HCC)    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   Chronic kidney infection    Coronary artery spasm (HCC)    Depressive disorder 06/11/2022   Was on Zyprexa in the past which helps her depression and her mood and her weight gain needs but to her knowledge does not have a diagnosis of schizophrenia or bipolar and denies any history of psychotic symptoms   Diabetes mellitus type I (HCC)    Diabetic gastroparesis (HCC)    Diarrhea 01/22/2023   Already resolved was transient, used imodium   DM (diabetes mellitus) (HCC)    Gastroparesis    Due to diabetes    Hyperlipidemia    Hypertension    Hypothyroidism    Has thyroiditis with subsequent hyperthyroidism so off Synthroid   Nausea & vomiting 05/03/2019   Nausea with vomiting 06/21/2013   Seizure disorder (HCC)    Umbilical hernia    Unintentional weight loss 06/11/2022   Associated with grief, unmanaged dm 1 and thyroid disease 127->98 lb as of 06/11/22   Increased body mass noted.  Body mass index is 19.1 kg/m.  Well proportioned with no abnormal fat distribution.  Good muscle tone.         Lab Results  Component  Value  Date     TSH  2.54  06/11/2022         Lab Results  Component  Value  Date     HGBA1C  11.3 (H)  01/15/2023            Wt Readings from Last 10    Past Surgical History:  Procedure Laterality Date   ADENOIDECTOMY     CARDIAC CATHETERIZATION     LEFT HEART CATH AND CORONARY ANGIOGRAPHY N/A 02/18/2018   Procedure: LEFT HEART CATH AND CORONARY ANGIOGRAPHY;  Surgeon: Corky Crafts,  MD;  Location: [facility];  Service: Cardiovascular;  Laterality: N/A;   TONSILLECTOMY     VAGINAL HYSTERECTOMY     left ovary removed , has her right ovary   Family History  Problem Relation Age of Onset   Hypertension Mother    Hyperlipidemia Mother    Heart disease Mother    Early death Mother    Diabetes Mother    Heart attack Mother    Depression Mother    Anxiety disorder Mother    Hearing loss Father    Hyperlipidemia Father    Hypertension Father    Mood Disorder Father    Thyroid disease Father    Pulmonary embolism Daughter    Depression Paternal Aunt    Anxiety disorder Paternal Aunt    Diabetes Paternal Aunt    Diabetes Paternal Aunt    Depression Maternal Grandmother    Anxiety disorder Maternal Grandmother    Goiter Maternal Grandmother    Diabetes Maternal Grandmother    Ovarian cancer Paternal Grandmother        great grandmother   Diabetes Paternal Grandmother    Colon cancer Neg Hx    Social History   Socioeconomic History   Marital status: Single    Spouse name: Not on file   Number of children: 2   Years of education: Not on file   Highest education level: Not on file  Occupational History   Not on file  Tobacco Use   Smoking status: Former    Packs/day: 1.00    Years: 13.00    Additional pack years: 0.00    Total pack years: 13.00    Types: Cigarettes    Quit date: 02/17/2018     Years since quitting: 4.9   Smokeless tobacco: Never   Tobacco comments:    Tobacco info given  Vaping Use   Vaping Use: Every day  Substance and Sexual Activity   Alcohol use: Never   Drug use: Not Currently    Types: Marijuana    Comment: a few times a week to stimulate appetite   Sexual activity: Yes    Birth control/protection: Surgical  Other Topics Concern   Not on file  Social History Narrative   Child daughter passed at 87 years old passed away 20 months    Social Determinants of Health   Financial Resource Strain: Not on file  Food Insecurity: Not on file  Transportation Needs: Not on file  Physical Activity: Sufficiently Active (02/15/2023)   Exercise Vital Sign    Days of Exercise per Week: 7 days    Minutes of Exercise per Session: 30 min  Stress: Stress Concern Present (02/15/2023)   [employer] of Occupational Health - Occupational Stress Questionnaire    Feeling of Stress : To some extent  Social Connections: Not on file    Tobacco Counseling Counseling given: Not Answered Tobacco comments: Tobacco info given   Clinical Intake:  Pre-visit preparation completed: Yes  Pain : No/denies pain     BMI - recorded: 19.57 Nutritional Status: BMI of 19-24  Normal Nutritional Risks: None Diabetes: Yes CBG done?: Yes (142 per pt) CBG resulted in Enter/ Edit results?: No Did pt. bring in CBG monitor from home?: No  How often do you need to have someone help you when you read instructions, pamphlets, or other written materials from your doctor or pharmacy?: 1 - Never  Diabetic?Nutrition Risk Assessment:  Has the patient had any N/V/D within the last 2  months?  No  Does the patient have any non-healing wounds?  No  Has the patient had any unintentional weight loss or weight gain?  No   Diabetes:  Is the patient diabetic?  Yes  If diabetic, was a CBG obtained today?  Yes  Did the patient bring in their glucometer from home?  No  How often do you  monitor your CBG's? Dexacom .   Financial Strains and Diabetes Management:  Are you having any financial strains with the device, your supplies or your medication? No .  Does the patient want to be seen by Chronic Care Management for management of their diabetes?  No  Would the patient like to be referred to a Nutritionist or for Diabetic Management?  No   Diabetic Exams:  Diabetic Eye Exam: Completed per pt Diabetic Foot Exam: Overdue, Pt has been advised about the importance in completing this exam. Pt is scheduled for diabetic foot exam on next appt .   Interpreter Needed?: No  Information entered by :: Lanier Ensign, LPN   Activities of Daily Living    02/15/2023   11:11 AM  In your present state of health, do you have any difficulty performing the following activities:  Hearing? 1  Comment slight HOH  Vision? 0  Difficulty concentrating or making decisions? 0  Walking or climbing stairs? 0  Dressing or bathing? 0  Doing errands, shopping? 0  Preparing Food and eating ? N  Using the Toilet? N  In the past six months, have you accidently leaked urine? N  Do you have problems with loss of bowel control? N  Managing your Medications? N  Managing your Finances? N  Housekeeping or managing your Housekeeping? N    Patient Care Team: Lula Olszewski, MD as PCP - General (Internal Medicine) Lars Masson, MD as PCP - Cardiology (Cardiology) Elwin Mocha, MD as Consulting Physician (Ophthalmology)  Indicate any recent Medical Services you may have received from other than [facility] providers in the past year (date may be approximate).     Assessment:   This is a routine wellness examination for Karen Hayden.  Hearing/Vision screen Hearing Screening - Comments:: Pt slight HOH  Vision Screening - Comments:: Pt follows up with [facility] Dr Lisbeth Renshaw for annual eye exams   Dietary issues and exercise activities discussed: Current Exercise Habits: Home exercise  routine, Type of exercise: walking, Time (Minutes): 30, Frequency (Times/Week): 7, Weekly Exercise (Minutes/Week): 210   Goals Addressed             This Visit's Progress    Patient Stated       Lower A1C and protect kidney       Depression Screen    02/15/2023   11:05 AM 01/22/2023    1:49 PM  PHQ 2/9 Scores  PHQ - 2 Score 5 5  PHQ- 9 Score 16 18    Fall Risk    02/15/2023   11:10 AM 01/22/2023    1:48 PM  Fall Risk   Falls in the past year? 1 1  Number falls in past yr: 1 0  Injury with Fall? 1 1  Comment cut head open   Risk for fall due to : Impaired balance/gait;Impaired vision No Fall Risks  Follow up Falls prevention discussed Falls evaluation completed    FALL RISK PREVENTION PERTAINING TO THE HOME:  Any stairs in or around the home? Yes  If so, are there any without handrails? No  Home free  of loose throw rugs in walkways, pet beds, electrical cords, etc? Yes  Adequate lighting in your home to reduce risk of falls? Yes   ASSISTIVE DEVICES UTILIZED TO PREVENT FALLS:  Life alert? No  Use of a cane, walker or w/c? No  Grab bars in the bathroom? No  Shower chair or bench in shower? No  Elevated toilet seat or a handicapped toilet? No   TIMED UP AND GO:  Was the test performed? No .   Cognitive Function:        02/15/2023   11:11 AM  6CIT Screen  What Year? 0 points  What month? 0 points  What time? 0 points  Count back from 20 0 points  Months in reverse 0 points  Repeat phrase 0 points  Total Score 0 points    Immunizations Immunization History  Administered Date(s) Administered   Hepatitis B, PED/ADOLESCENT 01/14/2015, 02/15/2015, 08/02/2015   Influenza, Quadrivalent, Recombinant, Inj, Pf 05/30/2018, 06/13/2019, 05/16/2021   Influenza,inj,Quad PF,6+ Mos 05/05/2022   Influenza-Unspecified 05/01/2012   Pneumococcal Polysaccharide-23 02/18/2018   Tdap 08/11/2013    TDAP status: Up to date  Flu Vaccine status: Up to  date    Covid-19 vaccine status: Declined, Education has been provided regarding the importance of this vaccine but patient still declined. Advised may receive this vaccine at local pharmacy or Health Dept.or vaccine clinic. Aware to provide a copy of the vaccination record if obtained from local pharmacy or Health Dept. Verbalized acceptance and understanding.   Screening Tests Health Maintenance  Topic Date Due   OPHTHALMOLOGY EXAM  Never done   FOOT EXAM  03/25/2023 (Originally 03/29/1986)   Colonoscopy  06/12/2023 (Originally 03/29/2020)   INFLUENZA VACCINE  04/01/2023   HEMOGLOBIN A1C  07/18/2023   DTaP/Tdap/Td (2 - Td or Tdap) 08/12/2023   Diabetic kidney evaluation - eGFR measurement  01/15/2024   Diabetic kidney evaluation - Urine ACR  01/15/2024   Medicare Annual Wellness (AWV)  02/15/2024   Hepatitis C Screening  Completed   HIV Screening  Completed   HPV VACCINES  Aged Out   PAP SMEAR-Modifier  Discontinued   COVID-19 Vaccine  Discontinued    Health Maintenance  Health Maintenance Due  Topic Date Due   OPHTHALMOLOGY EXAM  Never done    Postponed colorectal cancer    Additional Screening:  Hepatitis C Screening: Completed 01/09/15  Vision Screening: Recommended annual ophthalmology exams for early detection of glaucoma and other disorders of the eye. Is the patient up to date with their annual eye exam?  Yes  Who is the provider or what is the name of the office in which the patient attends annual eye exams? Pt stated 08/2022  If pt is not established with a provider, would they like to be referred to a provider to establish care? No .   Dental Screening: Recommended annual dental exams for proper oral hygiene  Community Resource Referral / Chronic Care Management: CRR required this visit?  No   CCM required this visit?  No      Plan:     I have personally reviewed and noted the following in the patient's chart:   Medical and social history Use of  alcohol, tobacco or illicit drugs  Current medications and supplements including opioid prescriptions. Patient is not currently taking opioid prescriptions. Functional ability and status Nutritional status Physical activity Advanced directives List of other physicians Hospitalizations, surgeries, and ER visits in previous 12 months Vitals Screenings to include cognitive, depression, and  falls Referrals and appointments  In addition, I have reviewed and discussed with patient certain preventive protocols, quality metrics, and best practice recommendations. A written personalized care plan for preventive services as well as general preventive health recommendations were provided to patient.     Marzella Schlein, LPN   [id number]   Nurse Notes: none

## 2023-02-24 NOTE — Progress Notes (Signed)
  Chronic Care Management   Note  02/24/2023 Name: Karen Hayden MRN: [id number] DOB: [birth year]  Karen Hayden is a 47 y.o. year old female who is a primary care patient of Lula Olszewski, MD. I reached out to Carolann Littler by phone today in response to a referral sent by [name]'s PCP.  The third contact attempt was unsuccessful.   Follow up plan: Unable to reach patient after 3 attempts. No further outreach attempts will be made pending additional provider engagement, patient request, or new provider order.   Penne Lash, RMA Care Guide [facility]  [place], [place] [address] Direct Dial: [phone] [email or web address]  \

## 2023-03-01 ENCOUNTER — Encounter: Payer: Self-pay | Admitting: Internal Medicine

## 2023-03-01 ENCOUNTER — Ambulatory Visit (INDEPENDENT_AMBULATORY_CARE_PROVIDER_SITE_OTHER): Payer: Medicare HMO | Admitting: Internal Medicine

## 2023-03-01 VITALS — BP 100/70 | HR 70 | Temp 98.2°F | Ht 62.0 in | Wt 108.8 lb

## 2023-03-01 DIAGNOSIS — E890 Postprocedural hypothyroidism: Secondary | ICD-10-CM | POA: Diagnosis not present

## 2023-03-01 DIAGNOSIS — E109 Type 1 diabetes mellitus without complications: Secondary | ICD-10-CM

## 2023-03-01 DIAGNOSIS — F32A Depression, unspecified: Secondary | ICD-10-CM

## 2023-03-01 DIAGNOSIS — E782 Mixed hyperlipidemia: Secondary | ICD-10-CM | POA: Diagnosis not present

## 2023-03-01 DIAGNOSIS — F319 Bipolar disorder, unspecified: Secondary | ICD-10-CM

## 2023-03-01 DIAGNOSIS — Z794 Long term (current) use of insulin: Secondary | ICD-10-CM

## 2023-03-01 DIAGNOSIS — Z7984 Long term (current) use of oral hypoglycemic drugs: Secondary | ICD-10-CM

## 2023-03-01 DIAGNOSIS — Z5987 Material hardship due to limited financial resources, not elsewhere classified: Secondary | ICD-10-CM | POA: Insufficient documentation

## 2023-03-01 DIAGNOSIS — E1065 Type 1 diabetes mellitus with hyperglycemia: Secondary | ICD-10-CM

## 2023-03-01 HISTORY — DX: Material hardship due to limited financial resources, not elsewhere classified: Z59.87

## 2023-03-01 NOTE — Assessment & Plan Note (Signed)
Hyperlipidemia: They have started Repatha for hyperlipidemia, showing good tolerance without side effects. We will continue Repatha and explore patient assistance programs to alleviate costs.

## 2023-03-01 NOTE — Assessment & Plan Note (Signed)
Doing better on mood due to zyprexa will continue but need close follow up but finances limiting.

## 2023-03-01 NOTE — Progress Notes (Signed)
[facility]: [phone]   Routine Medical Office Visit  Patient:  SESILIA GALECKI      Age: 47 y.o.       Sex:  female  Date:   03/01/2023 Patient Care Team: Lula Olszewski, MD as PCP - General (Internal Medicine) Lars Masson, MD as PCP - Cardiology (Cardiology) Elwin Mocha, MD as Consulting Physician (Ophthalmology) Today's Healthcare Provider: Lula Olszewski, MD   Assessment and Plan:   Elenita was seen today for 3 week follow-up.  Type 1 diabetes mellitus with hyperglycemia (HCC) Overview: She reports compliance/adherence with his current medication(s) which include: Diabetic Medications as of 01/22/2023           Glucagon (GVOKE HYPOPEN 2-PACK) 0.5 MG/0.1ML SOAJ (Taking) Inject 0.5 mg into the skin daily as needed (for low blood sugar).   Glucagon (GVOKE HYPOPEN 2-PACK) 0.5 MG/0.1ML SOAJ (Taking) Inject 0.5 mg into the skin daily as needed (for low blood sugar).   Glucagon 0.5 MG/0.1ML SOAJ (Taking) Inject 0.5 mg into the skin daily. As needed (for low blood sugar).   insulin glargine (LANTUS SOLOSTAR) 100 UNIT/ML Solostar Pen (Taking) Inject 25 Units into the skin in the morning. Replaces insulin NPH.       insulin regular (NOVOLIN R) 100 units/mL injection (Taking) Inject 0.1 mLs (10 Units total) into the skin 3 (three) times daily before meals. 1 unit per 8 grams of carbs.at meals and to correct high blood sugar.  Jardiance 10 mg daily     Labs and Risk Scores: Lab Results  Component Value Date   HGBA1C 11.3 (H) 01/15/2023   HGBA1C 11.3 (H) 06/11/2022   HGBA1C 7.8 (H) 05/03/2019   HGBA1C 7.7 (H) 02/17/2018   HGBA1C 9.1 (H) 05/08/2017   Lab Results  Component Value Date   LDLCALC 101 (H) 01/15/2023   Lab Results  Component Value Date   MICROALBUR 3.5 (H) 01/15/2023   MICROALBUR <0.7 06/11/2022   Diabetes Composite Score: 2  Values used to calculate this score:   Points  Metrics      1        Blood Pressure:  100/70      0        Prescribed Statins: No      0        Hemoglobin A1c: 11.3%      1        Smokes Tobacco: No      0        Prescribed Aspirin: No      Health Maintenance: Diabetes Health Maintenance Due  Topic Date Due   OPHTHALMOLOGY EXAM  Never done   FOOT EXAM  03/25/2023 (Originally 03/29/1986)   HEMOGLOBIN A1C  07/18/2023  Eye exam was done 08/2022 but we don't have record abstracted, Dr. Elicia Lamp at [facility] ophthalmolgy, Nelva Nay his her eye surgeon.  No foot exam found Diabetic Foot Exam - Simple   No data filed    Care Team Ophthalmologist:  Elwin Mocha, MD    Assessment & Plan: Worsening control of brittle diabetes. Also no insulin pump yet although we ordered. Also difficulty getting Chronic Care Management (CCM) but she will call. Suspicious for voicemail overfilled causing all problems. Ok'g stay on zyprex if helping - advised patient to do resistance training + insulin + food to get muscle.     Postablative hypothyroidism Overview: March 01, 2023 interim history: needs repeat around 04/01/23 due to change from 100-125 mcg feeling  better.  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 Procedure note for radioactive iodine 12/2018 states: Hyperthyroidism nodular Graves disease versus toxic multinodular nodular goiter. TSH equal 0.00.CLINICAL DATA:  Hyperthyroidism with neck swelling, tenderness,difficulty swallowing, weight loss and heat intolerance. History of hypothyroidism. Serum TSH level 0.141 on 02/17/2018. There is heterogeneous uptake by the thyroid gland with nodular areas of increased activity in both lobes, right greater than left. No cold nodules are identified. Elevated radioactive iodine uptake (46.2%) by the thyroid gland at 24 hours with nodular areas of increased activity consistent  with toxic multinodular goiter.   Lab Results  Component Value Date   TSH 2.54 06/11/2022   TSH 0.141 (L) 02/17/2018   TSH 0.011 (L) 05/05/2017   TSH 1.330 01/26/2014   TSH 3.04 06/20/2013   FREET4 0.6 (L) 01/15/2023   FREET4 1.08 06/11/2022   FREET4 0.70 (L) 02/17/2018   FREET4 1.26 (H) 05/08/2017   anti-TPO antibodies: N/A  anti-Tg antibodies: N/A  Anti-TSI antibodies: N/A    Family history of thyroid disease: N/A  Patient last evaluated for thyroid nodules: 2020 ultrasound showing toxic multinodular goiter   Last ultrasound for thyroid nodules: N/A  Last heart exam for rhythm check:  Echocardiogram 03/07/2020: Normal LV systolic function with EF 55-60%. Left ventricle cavity is normal in size. Normal global wall motion. Normal diastolic filling pattern, normal LAP. Calculated EF 58%. Mild (Grade I) aortic regurgitation. Mild (Grade I) mitral regurgitation. Mild tricuspid regurgitation. No evidence of pulmonary hypertension. IVC is dilated with a respiratory response of >50%. No significant change compared to prior study dated 02/18/2018.      Assessment & Plan: Hypothyroidism: Their Levothyroxine dose was recently increased from to , improving symptoms. Thyroid function tests will be checked in 4 weeks.   Material hardship due to limited financial resources Overview: Not following with endocrinology or psychiatry due to cost concerns  Assessment & Plan: Encouraged patient to work with Chronic Care Management (CCM)- she needs to clear voicemail and call Financial Strain: They are under financial strain, struggling with the cost of care and medications while working only one day a week due to anxiety about the physical demands of work and its impact on diabetes control. We will engage with Chronic Care Management to explore financial assistance and cost-saving strategies and consider less physically demanding employment with flexible hours.   Depressive  disorder Overview: She has history of bipolar 1 and depression and does really well on Zyprexa which was recently restarted and she reports it is doing great. We have a pending behavioral health and psych referral and they both call to arrange and I encouraged her to follow through with that as well as doing motivational therapy with either cognitive behavioral therapy solution focused therapy or really any kind of therapy using [place] Seems to be associated primarily with grief   Assessment & Plan: Doing better on mood due to zyprexa will continue but need close follow up but finances limiting.     Bipolar 1 disorder [facility]) Assessment & Plan: Bipolar Disorder/Depression: Their mood and sleep have improved on Zyprexa, though it may be worsening their diabetes control by increasing hunger. We will continue Zyprexa and consider a psychiatry referral when financially viable.   Brittle diabetes mellitus (HCC) Overview: gvoke not covered but she has glucagon. Not well controlled due to depression and cost of medicine/cgm making  mgmt difficult, also history of severe lows.  Assessment & Plan: Type 1 Diabetes Mellitus: They exhibit poor control of their Type 1 Diabetes Mellitus, experiencing frequent hyperglycemia and occasional hypoglycemia while on Lantus 25 units and Novolin R 1 unit per 8 grams of carbs at meals. Their recent increase in hunger and food intake may be linked to Zyprexa use. They monitor glucose with Dexcom. We will continue their current insulin regimen, encourage resistance training to enhance insulin sensitivity, consider alternative employment such as [employer] to lessen physical stress and injury risk, and engage with Chronic Care Management for optimal support and resource optimization.   Mixed hyperlipidemia Assessment & Plan: Hyperlipidemia: They have started Repatha for hyperlipidemia, showing good tolerance without side effects. We will continue Repatha and explore patient  assistance programs to alleviate costs.     General Health Maintenance: We will ensure they have access to all necessary diabetes management supplies, request eye exam records from [facility] and Dr. Nelva Nay, and encourage them to clear their voicemail to enhance communication and care coordination.   Recommended follow up: as soon as possible after getting financial situation addressed.  Chronic Care Management (CCM) will contact to assist. Future Appointments  Date Time Provider Department Center  02/21/2024 11:45 AM [facility] ANNUAL WELLNESS VISIT 1 [facility]           Clinical Presentation:    47 y.o. female who has Type 1 diabetes mellitus (HCC); SEIZURE DISORDER; Diabetic gastroparesis (HCC); Change in bowel habits; Unspecified constipation; Tobacco use disorder; Protein-calorie malnutrition, severe (HCC); Marijuana dependence (HCC); Coronary artery spasm (HCC); Palpitations; Hypothyroidism; Depressive disorder; Bipolar 1 disorder (HCC); Brittle diabetes mellitus (HCC); Acne; Hereditary and idiopathic neuropathy, unspecified; Hypertension; Joint pain; Pure hypercholesterolemia; Retinal tear; Chronic kidney disease, stage 2 (mild); Statin intolerance; Hyperlipidemia; and Material hardship due to limited financial resources on their problem list. Her reasons/main concerns/chief complaints for today's office visit are 3 week follow-up   AI-Extracted: Discussed the use of AI scribe software for clinical note transcription with the patient, who gave verbal consent to proceed.  History of Present Illness   The patient, with a complex medical history including diabetes, bipolar disorder, depression, and hypothyroidism, presents with worsening control of their diabetes. They attribute this to increased hunger and food intake, possibly related to the recent initiation of Zyprexa for their psychiatric conditions. They report consistent use of their insulin regimen, which  includes Lantus and Novolin R, and have been more diligent in their administration since starting Dexcom. They also report frequent urination, a known side effect of their Jardiance medication, and occasional swelling in their feet and ankles.  The patient has recently started Repatha, with no reported side effects, and is able to self-administer the injections. They also mention a recent eye surgery for cataract glaucoma and a retinal tear, with a follow-up surgery planned to correct residual floaters.  The patient's financial constraints have limited their access to specialist care, including endocrinology and psychiatry, and have also impacted their ability to manage their medication costs. Despite these challenges, they have been trying to supplement their income by working part-time at [employer], although they report this work to be physically demanding and stressful.  The patient's hypothyroidism was recently addressed with an adjustment in their medication dosage from 100 to 125 MCG, and they report feeling much better since the change.  In terms of lifestyle, the patient reports regular walking and swimming for exercise, but acknowledges the need for resistance training to help  manage their diabetes and overall health. They express anxiety about increasing their work hours and are considering alternative work options that would be less physically demanding and more flexible.        Reviewed chart data: Past Medical History:  Diagnosis Date   Acute bronchitis 01/04/2009   Qualifier: Diagnosis of   By: Cathren Harsh MD, Stephen       Acute posttraumatic stress disorder 07/06/2018   Anxiety    ARF (acute renal failure) (HCC) 05/03/2019   Lab Results  Component  Value  Date/Time     CREATININE  0.84  01/15/2023 09:57 AM     CREATININE  0.78  07/15/2022 02:09 PM     CREATININE  0.82  06/11/2022 03:16 PM     CREATININE  0.91  04/26/2022 07:50 PM     CREATININE  1.01 (H)  01/01/2021 06:20  PM         Bipolar 1 disorder (HCC)    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   Chronic kidney infection    Coronary artery spasm (HCC)    Depressive disorder 06/11/2022   Was on Zyprexa in the past which helps her depression and her mood and her weight gain needs but to her knowledge does not have a diagnosis of schizophrenia or bipolar and denies any history of psychotic symptoms   Diabetes mellitus type I (HCC)    Diabetic gastroparesis (HCC)    Diarrhea 01/22/2023   Already resolved was transient, used imodium   DM (diabetes mellitus) (HCC)    Gastroparesis    Due to diabetes    Hyperlipidemia    Hypertension    Hypothyroidism    Has thyroiditis with subsequent hyperthyroidism so off Synthroid   Insulin long-term use (HCC) 01/16/2023   Long term current use of oral hypoglycemic drug 01/16/2023   Nausea & vomiting 05/03/2019   Nausea with vomiting 06/21/2013   Seizure disorder (HCC)    Umbilical hernia    Unintentional weight loss 06/11/2022   Associated with grief, unmanaged dm 1 and thyroid disease 127->98 lb as of 06/11/22  Increased body mass noted.  Body mass index is 19.1 kg/m.  Well proportioned with no abnormal fat distribution.  Good muscle tone.         Lab Results  Component  Value  Date     TSH  2.54  06/11/2022         Lab Results  Component  Value  Date     HGBA1C  11.3 (H)  01/15/2023            Wt Readings from Last 10     Outpatient Medications Prior to Visit  Medication Sig   Continuous Glucose Sensor (DEXCOM G7 SENSOR) MISC Apply sensor every 10 days. Pharmacy please provide 3 boxes (1 sensor per week)   empagliflozin (JARDIANCE) 10 MG TABS tablet Take 1 tablet (10 mg total) by mouth daily before breakfast.   Evolocumab (REPATHA SURECLICK) 140 MG/ML SOAJ Inject 140 mg into the skin every 14 (fourteen) days.   Glucagon (GVOKE HYPOPEN 2-PACK) 0.5 MG/0.1ML SOAJ Inject 0.5 mg into the skin daily as needed (for low blood sugar).   Insulin Disposable Pump  (OMNIPOD 5 G6 INTRO, GEN 5,) KIT 0.8 Units/hr by Does not apply route continuous. Using OmniPod along with Dexcom G6 to manage diabetes type 1 brittle please see Dr. For assistance with setting up but initial settings are planned to be 0.8 units/h continuous and 10-1 bolus ratio   Insulin  Disposable Pump (OMNIPOD 5 G7 INTRO, GEN 5,) KIT 1 each by Does not apply route continuous. brittle type 1 diabetes mellitus retinopathy and history of level 3 hypoglycemia (glucose <54 mg/dL and altered mental  that resulted in hospital admission and intubation.   HGBA1C  11.3 01/15/2023   insulin glargine (LANTUS SOLOSTAR) 100 UNIT/ML Solostar Pen Inject 25 Units into the skin in the morning. Replaces insulin NPH.   insulin NPH Human (NOVOLIN N) 100 UNIT/ML injection Inject 16 Units into the skin 2 (two) times daily before a meal.    insulin regular (NOVOLIN R) 100 units/mL injection Inject 0.1 mLs (10 Units total) into the skin 3 (three) times daily before meals. 1 unit per 8 grams of carbs.at meals and to correct high blood sugar.   Insulin Syringe-Needle U-100 (INSULIN SYRINGE .5CC/28G) 28G X 1/2" 0.5 ML MISC Use as instructed to administer insulin   levothyroxine (SYNTHROID) 125 MCG tablet Take 1 tablet (125 mcg total) by mouth daily. Replaces 100 mcg daily dosing.  Recheck thyroid levels 6-8 weeks after dose change   losartan (COZAAR) 25 MG tablet Take 1 tablet (25 mg total) by mouth daily. Keeps blood pressure lower and helps protects against kidney damage   OLANZapine (ZYPREXA) 5 MG tablet Take 1 tablet (5 mg total) by mouth at bedtime.   ondansetron (ZOFRAN-ODT) 4 MG disintegrating tablet Take 1 tablet (4 mg total) by mouth every 8 (eight) hours as needed for nausea or vomiting (for nausea from wegovy or other source).   No facility-administered medications prior to visit.         Clinical Data Analysis:   Physical Exam  BP 100/70 (BP Location: Right Arm, Patient Position: Sitting)   Pulse 70   Temp  98.2 F (36.8 C) (Temporal)   Ht 5\' 2"  (1.575 m)   Wt 108 lb 12.8 oz (49.4 kg)   SpO2 100%   BMI 19.90 kg/m  Wt Readings from Last 10 Encounters:  03/01/23 108 lb 12.8 oz (49.4 kg)  02/15/23 107 lb (48.5 kg)  02/08/23 107 lb 3.2 oz (48.6 kg)  01/22/23 108 lb (49 kg)  01/15/23 104 lb 6.4 oz (47.4 kg)  07/15/22 100 lb 6.4 oz (45.5 kg)  07/15/22 100 lb 9.6 oz (45.6 kg)  06/25/22 100 lb 6.4 oz (45.5 kg)  06/18/22 95 lb 6.4 oz (43.3 kg)  06/11/22 98 lb (44.5 kg)   Vital signs reviewed.  Nursing notes reviewed. Weight trend reviewed. Abnormalities and Problem-Specific physical exam findings:  stressed about financial, anxiety attack when considering attempting to return to work with current sugar problems.  General Appearance:  No acute distress appreciable.   Well-groomed, healthy-appearing female.  Well proportioned with no abnormal fat distribution.  Good muscle tone. Skin: Clear and well-hydrated. Pulmonary:  Normal work of breathing at rest, no respiratory distress apparent. SpO2: 100 %  Musculoskeletal: All extremities are intact.  Neurological:  Awake, alert, oriented, and engaged.  No obvious focal neurological deficits or cognitive impairments.  Sensorium seems unclouded.   Speech is clear and coherent with logical content. Psychiatric:  Appropriate mood, pleasant and cooperative demeanor, thoughtful and engaged during the exam  Results Reviewed:    No results found for any visits on 03/01/23.  Office Visit on 02/08/2023  Component Date Value   Color, Urine 02/08/2023 YELLOW    APPearance 02/08/2023 CLEAR    Specific Gravity, Urine 02/08/2023 1.010    pH 02/08/2023 6.5    Total Protein, Urine 02/08/2023 NEGATIVE  Urine Glucose 02/08/2023 >=1000 (A)    Ketones, ur 02/08/2023 NEGATIVE    Bilirubin Urine 02/08/2023 NEGATIVE    Hgb urine dipstick 02/08/2023 NEGATIVE    Urobilinogen, UA 02/08/2023 0.2    Leukocytes,Ua 02/08/2023 NEGATIVE    Nitrite 02/08/2023 NEGATIVE     WBC, UA 02/08/2023 0-2/hpf    RBC / HPF 02/08/2023 none seen    Squamous Epithelial / HPF 02/08/2023 Rare(0-4/hpf)   Office Visit on 01/22/2023  Component Date Value   MICRO NUMBER: 01/22/2023 [id number]    SPECIMEN QUALITY: 01/22/2023 Adequate    Sample Source 01/22/2023 URINE    STATUS: 01/22/2023 FINAL    ISOLATE 1: 01/22/2023 Escherichia coli (A)    ISOLATE 2: 01/22/2023 Streptococcus agalactiae (A)   Office Visit on 01/15/2023  Component Date Value   WBC 01/15/2023 4.8    RBC 01/15/2023 5.06    Hemoglobin 01/15/2023 14.4    HCT 01/15/2023 43.0    MCV 01/15/2023 85.1    MCHC 01/15/2023 33.6    RDW 01/15/2023 13.9    Platelets 01/15/2023 327.0    Neutrophils Relative % 01/15/2023 74.9    Lymphocytes Relative 01/15/2023 13.4    Monocytes Relative 01/15/2023 7.9    Eosinophils Relative 01/15/2023 2.7    Basophils Relative 01/15/2023 1.1    Neutro Abs 01/15/2023 3.6    Lymphs Abs 01/15/2023 0.6 (L)    Monocytes Absolute 01/15/2023 0.4    Eosinophils Absolute 01/15/2023 0.1    Basophils Absolute 01/15/2023 0.1    Sodium 01/15/2023 140    Potassium 01/15/2023 4.6    Chloride 01/15/2023 102    CO2 01/15/2023 30    Glucose, Bld 01/15/2023 122 (H)    BUN 01/15/2023 13    Creatinine, Ser 01/15/2023 0.84    Total Bilirubin 01/15/2023 0.5    Alkaline Phosphatase 01/15/2023 76    AST 01/15/2023 16    ALT 01/15/2023 13    Total Protein 01/15/2023 6.9    Albumin 01/15/2023 4.4    GFR 01/15/2023 82.53    Calcium 01/15/2023 10.0    Cholesterol 01/15/2023 177    Triglycerides 01/15/2023 90.0    HDL 01/15/2023 58.30    VLDL 01/15/2023 18.0    LDL Cholesterol 01/15/2023 101 (H)    Total CHOL/HDL Ratio 01/15/2023 3    NonHDL 01/15/2023 118.60    Hgb A1c MFr Bld 01/15/2023 11.3 (H)    Microalb, Ur 01/15/2023 3.5 (H)    Creatinine,U 01/15/2023 265.1    Microalb Creat Ratio 01/15/2023 1.3    TSH W/REFLEX TO FT4 01/15/2023 24.69 (H)    Free T4 01/15/2023 0.6 (L)   Admission on  07/15/2022, Discharged on 07/15/2022  Component Date Value   Lipase 07/15/2022 55 (H)    Sodium 07/15/2022 140    Potassium 07/15/2022 3.9    Chloride 07/15/2022 101    CO2 07/15/2022 30    Glucose, Bld 07/15/2022 122 (H)    BUN 07/15/2022 18    Creatinine, Ser 07/15/2022 0.78    Calcium 07/15/2022 10.0    Total Protein 07/15/2022 7.0    Albumin 07/15/2022 4.3    AST 07/15/2022 20    ALT 07/15/2022 27    Alkaline Phosphatase 07/15/2022 70    Total Bilirubin 07/15/2022 0.4    GFR, Estimated 07/15/2022 >60    Anion gap 07/15/2022 9    WBC 07/15/2022 7.6    RBC 07/15/2022 5.05    Hemoglobin 07/15/2022 13.8    HCT 07/15/2022 41.1    MCV 07/15/2022 81.4  MCH 07/15/2022 27.3    MCHC 07/15/2022 33.6    RDW 07/15/2022 13.2    Platelets 07/15/2022 350    nRBC 07/15/2022 0.0    Color, Urine 07/15/2022 YELLOW    APPearance 07/15/2022 CLEAR    Specific Gravity, Urine 07/15/2022 1.014    pH 07/15/2022 7.5    Glucose, UA 07/15/2022 NEGATIVE    Hgb urine dipstick 07/15/2022 NEGATIVE    Bilirubin Urine 07/15/2022 NEGATIVE    Ketones, ur 07/15/2022 NEGATIVE    Protein, ur 07/15/2022 NEGATIVE    Nitrite 07/15/2022 NEGATIVE    Leukocytes,Ua 07/15/2022 NEGATIVE    Glucose-Capillary 07/15/2022 108 (H)    Glucose-Capillary 07/15/2022 81   Office Visit on 07/15/2022  Component Date Value   SARS Coronavirus 2 Ag 07/15/2022 Negative    Influenza A, POC 07/15/2022 Negative    Influenza B, POC 07/15/2022 Negative   Office Visit on 06/11/2022  Component Date Value   WBC 06/11/2022 6.4    RBC 06/11/2022 5.10    Platelets 06/11/2022 290.0    Hemoglobin 06/11/2022 14.0    HCT 06/11/2022 42.4    MCV 06/11/2022 83.2    MCHC 06/11/2022 33.0    RDW 06/11/2022 13.3    Sodium 06/11/2022 136    Potassium 06/11/2022 3.6    Chloride 06/11/2022 99    CO2 06/11/2022 28    Glucose, Bld 06/11/2022 248 (H)    BUN 06/11/2022 10    Creatinine, Ser 06/11/2022 0.82    Total Bilirubin 06/11/2022 0.7     Alkaline Phosphatase 06/11/2022 64    AST 06/11/2022 27    ALT 06/11/2022 22    Total Protein 06/11/2022 7.3    Albumin 06/11/2022 4.6    GFR 06/11/2022 85.31    Calcium 06/11/2022 10.0    Hgb A1c MFr Bld 06/11/2022 11.3 (H)    TSH 06/11/2022 2.54    Free T4 06/11/2022 1.08    Microalb, Ur 06/11/2022 <0.7    Creatinine,U 06/11/2022 32.9    Microalb Creat Ratio 06/11/2022 2.1    Color, UA 06/11/2022 yellow    Clarity, UA 06/11/2022 clear    Glucose, UA 06/11/2022 Negative    Bilirubin, UA 06/11/2022 Negative    Ketones, UA 06/11/2022 Negative    Spec Grav, UA 06/11/2022 1.010    Blood, UA 06/11/2022 Negative    pH, UA 06/11/2022 6.0    Protein, UA 06/11/2022 Negative    Urobilinogen, UA 06/11/2022 0.2    Nitrite, UA 06/11/2022 Negative    Leukocytes, UA 06/11/2022 Trace (A)   Admission on 04/26/2022, Discharged on 04/27/2022  Component Date Value   Sodium 04/26/2022 135    Potassium 04/26/2022 4.0    Chloride 04/26/2022 98    CO2 04/26/2022 28    Glucose, Bld 04/26/2022 420 (H)    BUN 04/26/2022 14    Creatinine, Ser 04/26/2022 0.91    Calcium 04/26/2022 10.1    GFR, Estimated 04/26/2022 >60    Anion gap 04/26/2022 9    WBC 04/26/2022 6.0    RBC 04/26/2022 5.29 (H)    Hemoglobin 04/26/2022 14.5    HCT 04/26/2022 43.9    MCV 04/26/2022 83.0    MCH 04/26/2022 27.4    MCHC 04/26/2022 33.0    RDW 04/26/2022 12.8    Platelets 04/26/2022 349    nRBC 04/26/2022 0.0    Color, Urine 04/26/2022 COLORLESS (A)    APPearance 04/26/2022 CLEAR    Specific Gravity, Urine 04/26/2022 1.007    pH 04/26/2022 6.5  Glucose, UA 04/26/2022 >1,000 (A)    Hgb urine dipstick 04/26/2022 NEGATIVE    Bilirubin Urine 04/26/2022 NEGATIVE    Ketones, ur 04/26/2022 NEGATIVE    Protein, ur 04/26/2022 NEGATIVE    Nitrite 04/26/2022 NEGATIVE    Leukocytes,Ua 04/26/2022 NEGATIVE    RBC / HPF 04/26/2022 0-5    WBC, UA 04/26/2022 0-5    Squamous Epithelial / HPF 04/26/2022 0-5     Glucose-Capillary 04/26/2022 397 (H)    SARS Coronavirus 2 by RT* 04/26/2022 NEGATIVE    Total Protein 04/26/2022 7.3    Albumin 04/26/2022 4.3    AST 04/26/2022 24    ALT 04/26/2022 16    Alkaline Phosphatase 04/26/2022 60    Total Bilirubin 04/26/2022 0.7    Bilirubin, Direct 04/26/2022 0.1    Indirect Bilirubin 04/26/2022 0.6    No image results found.   No results found.     This encounter employed real-time, collaborative documentation. The patient actively reviewed and updated their medical record on a shared screen, ensuring transparency and facilitating joint problem-solving for the problem list, overview, and plan. This approach promotes accurate, informed care. The treatment plan was discussed and reviewed in detail, including medication safety, potential side effects, and all patient questions. We confirmed understanding and comfort with the plan. Follow-up instructions were established, including contacting the office for any concerns, returning if symptoms worsen, persist, or new symptoms develop, and precautions for potential emergency department visits. ----------------------------------------------------- Lula Olszewski, MD  03/01/2023 7:04 PM  [facility]:  [phone]

## 2023-03-01 NOTE — Assessment & Plan Note (Addendum)
Encouraged patient to work with Chronic Care Management (CCM)- she needs to clear voicemail and call Financial Strain: They are under financial strain, struggling with the cost of care and medications while working only one day a week due to anxiety about the physical demands of work and its impact on diabetes control. We will engage with Chronic Care Management to explore financial assistance and cost-saving strategies and consider less physically demanding employment with flexible hours.

## 2023-03-01 NOTE — Assessment & Plan Note (Signed)
Hypothyroidism: Their Levothyroxine dose was recently increased from to , improving symptoms. Thyroid function tests will be checked in 4 weeks.

## 2023-03-01 NOTE — Assessment & Plan Note (Signed)
Worsening control of brittle diabetes. Also no insulin pump yet although we ordered. Also difficulty getting Chronic Care Management (CCM) but she will call. Suspicious for voicemail overfilled causing all problems. Ok'g stay on zyprex if helping - advised patient to do resistance training + insulin + food to get muscle.

## 2023-03-01 NOTE — Assessment & Plan Note (Signed)
Bipolar Disorder/Depression: Their mood and sleep have improved on Zyprexa, though it may be worsening their diabetes control by increasing hunger. We will continue Zyprexa and consider a psychiatry referral when financially viable.

## 2023-03-01 NOTE — Assessment & Plan Note (Signed)
Type 1 Diabetes Mellitus: They exhibit poor control of their Type 1 Diabetes Mellitus, experiencing frequent hyperglycemia and occasional hypoglycemia while on Lantus 25 units and Novolin R 1 unit per 8 grams of carbs at meals. Their recent increase in hunger and food intake may be linked to Zyprexa use. They monitor glucose with Dexcom. We will continue their current insulin regimen, encourage resistance training to enhance insulin sensitivity, consider alternative employment such as [employer] to lessen physical stress and injury risk, and engage with Chronic Care Management for optimal support and resource optimization.

## 2023-04-26 DIAGNOSIS — E10649 Type 1 diabetes mellitus with hypoglycemia without coma: Secondary | ICD-10-CM | POA: Diagnosis not present

## 2023-04-27 NOTE — Progress Notes (Signed)
No action done

## 2023-05-04 DIAGNOSIS — S63501A Unspecified sprain of right wrist, initial encounter: Secondary | ICD-10-CM | POA: Diagnosis not present

## 2023-05-04 DIAGNOSIS — S6391XA Sprain of unspecified part of right wrist and hand, initial encounter: Secondary | ICD-10-CM | POA: Diagnosis not present

## 2023-05-12 DIAGNOSIS — H33312 Horseshoe tear of retina without detachment, left eye: Secondary | ICD-10-CM | POA: Diagnosis not present

## 2023-06-18 ENCOUNTER — Ambulatory Visit (INDEPENDENT_AMBULATORY_CARE_PROVIDER_SITE_OTHER): Payer: Medicare HMO | Admitting: Internal Medicine

## 2023-06-18 ENCOUNTER — Encounter: Payer: Self-pay | Admitting: Internal Medicine

## 2023-06-18 VITALS — BP 102/80 | HR 78 | Temp 98.1°F | Ht 62.0 in | Wt 111.0 lb

## 2023-06-18 DIAGNOSIS — E1021 Type 1 diabetes mellitus with diabetic nephropathy: Secondary | ICD-10-CM

## 2023-06-18 DIAGNOSIS — F319 Bipolar disorder, unspecified: Secondary | ICD-10-CM | POA: Diagnosis not present

## 2023-06-18 DIAGNOSIS — Z1211 Encounter for screening for malignant neoplasm of colon: Secondary | ICD-10-CM

## 2023-06-18 DIAGNOSIS — E890 Postprocedural hypothyroidism: Secondary | ICD-10-CM

## 2023-06-18 DIAGNOSIS — R8290 Unspecified abnormal findings in urine: Secondary | ICD-10-CM

## 2023-06-18 DIAGNOSIS — N182 Chronic kidney disease, stage 2 (mild): Secondary | ICD-10-CM | POA: Diagnosis not present

## 2023-06-18 DIAGNOSIS — R300 Dysuria: Secondary | ICD-10-CM | POA: Diagnosis not present

## 2023-06-18 DIAGNOSIS — R350 Frequency of micturition: Secondary | ICD-10-CM | POA: Diagnosis not present

## 2023-06-18 LAB — POCT URINALYSIS DIPSTICK
Bilirubin, UA: NEGATIVE
Blood, UA: NEGATIVE
Glucose, UA: POSITIVE — AB
Ketones, UA: NEGATIVE
Leukocytes, UA: NEGATIVE
Nitrite, UA: POSITIVE
Protein, UA: NEGATIVE
Spec Grav, UA: 1.01 (ref 1.010–1.025)
Urobilinogen, UA: 0.2 U/dL
pH, UA: 6 (ref 5.0–8.0)

## 2023-06-18 LAB — COMPREHENSIVE METABOLIC PANEL
ALT: 19 U/L (ref 0–35)
AST: 25 U/L (ref 0–37)
Albumin: 4.5 g/dL (ref 3.5–5.2)
Alkaline Phosphatase: 81 U/L (ref 39–117)
BUN: 12 mg/dL (ref 6–23)
CO2: 34 meq/L — ABNORMAL HIGH (ref 19–32)
Calcium: 10.9 mg/dL — ABNORMAL HIGH (ref 8.4–10.5)
Chloride: 99 meq/L (ref 96–112)
Creatinine, Ser: 0.84 mg/dL (ref 0.40–1.20)
GFR: 82.29 mL/min (ref >60.00)
Glucose, Bld: 93 mg/dL (ref 70–99)
Potassium: 4.7 meq/L (ref 3.5–5.1)
Sodium: 139 meq/L (ref 135–145)
Total Bilirubin: 0.4 mg/dL (ref 0.2–1.2)
Total Protein: 7.4 g/dL (ref 6.0–8.3)

## 2023-06-18 LAB — URINALYSIS, ROUTINE W REFLEX MICROSCOPIC
Bilirubin Urine: NEGATIVE
Hgb urine dipstick: NEGATIVE
Ketones, ur: NEGATIVE
Leukocytes,Ua: NEGATIVE
Nitrite: NEGATIVE
RBC / HPF: NONE SEEN (ref 0–?)
Specific Gravity, Urine: <=1.005 — AB (ref 1.000–1.030)
Total Protein, Urine: NEGATIVE
Urine Glucose: >=1000 — AB
Urobilinogen, UA: 0.2 (ref 0.0–1.0)
pH: 6.5 (ref 5.0–8.0)

## 2023-06-18 LAB — CBC WITH DIFFERENTIAL/PLATELET
Basophils Absolute: 0 10*3/uL (ref 0.0–0.1)
Basophils Relative: 0.8 % (ref 0.0–3.0)
Eosinophils Absolute: 0.3 10*3/uL (ref 0.0–0.7)
Eosinophils Relative: 5.5 % — ABNORMAL HIGH (ref 0.0–5.0)
HCT: 45.2 % (ref 36.0–46.0)
Hemoglobin: 14.6 g/dL (ref 12.0–15.0)
Lymphocytes Relative: 13.1 % (ref 12.0–46.0)
Lymphs Abs: 0.8 10*3/uL (ref 0.7–4.0)
MCHC: 32.3 g/dL (ref 30.0–36.0)
MCV: 85 fL (ref 78.0–100.0)
Monocytes Absolute: 0.5 10*3/uL (ref 0.1–1.0)
Monocytes Relative: 8.1 % (ref 3.0–12.0)
Neutro Abs: 4.4 10*3/uL (ref 1.4–7.7)
Neutrophils Relative %: 72.5 % (ref 43.0–77.0)
Platelets: 378 10*3/uL (ref 150.0–400.0)
RBC: 5.32 Mil/uL — ABNORMAL HIGH (ref 3.87–5.11)
RDW: 13.9 % (ref 11.5–15.5)
WBC: 6 10*3/uL (ref 4.0–10.5)

## 2023-06-18 LAB — HEMOGLOBIN A1C: Hgb A1c MFr Bld: 9.3 % — ABNORMAL HIGH (ref 4.6–6.5)

## 2023-06-18 MED ORDER — OLANZAPINE 10 MG PO TABS
10.0000 mg | ORAL_TABLET | Freq: Every day | ORAL | 3 refills | Status: DC
Start: 2023-06-18 — End: 2024-03-21

## 2023-06-18 MED ORDER — OMNIPOD 5 G7 PODS (GEN 5) MISC: 5.0000 | Freq: Every day | 3 refills | Status: AC

## 2023-06-18 MED ORDER — DAPAGLIFLOZIN PROPANEDIOL 10 MG PO TABS: 10.0000 mg | ORAL_TABLET | Freq: Every day | ORAL | 3 refills | Status: AC

## 2023-06-18 NOTE — Patient Instructions (Addendum)
VISIT SUMMARY:  We discussed your current medications, your interest in an insulin pump, and your request for re-evaluation of your thyroid medication. We also discussed your ongoing management of chronic kidney disease. And we checked out your urine for concern(s) urinary tract infection (UTI).  YOUR PLAN:   -ENDOCRINE MANAGEMENT: Since you're interested in using an insulin pump, we will refer you to an endocrinologist, a doctor who specializes in hormone-related conditions, for further management.  We will also try to get the pump ourselves  -THYROID MANAGEMENT: You requested a re-evaluation of your thyroid medication. We will order thyroid function tests to assess the current status and effectiveness of your medication.  -CHRONIC KIDNEY DISEASE STAGE 2: You are currently taking Jardiance for your kidney disease. We will continue this medication at the same dosage and frequency, and we will order renal function tests to monitor the progression of your disease.  -GENERAL HEALTH MAINTENANCE: As part of your general health maintenance, we will order a Cologuard test. This is a non-invasive test used to screen for colorectal cancer.  INSTRUCTIONS:  Please provide a urine sample for analysis and complete the ordered liver function tests, thyroid function tests, renal function tests, and the Cologuard test. Also, please schedule an appointment with an endocrinologist for further management of your insulin pump interest.

## 2023-06-18 NOTE — Progress Notes (Addendum)
[facility]: [phone]   -- Medical Office Visit --  Patient:  Karen Hayden      Age: 47 y.o.       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  not at goal but good for brittle diabetes mellitus. Continue(s) with continuous glucose monitor and insulin Will try to get insulin pump she used to have minimed we will try to get tandem omnipod to go with her G7 Endocrine Management   Using Dexcom G7 and expressing interest in an insulin pump, she will be referred to an endocrinologist for further management and potential insulin pump setup.We need her to re-establish with endocrine for brittle diabetes mellitus now that finances better. Arranged referral back to Dr. Talmage Nap. Dysuria  Urinary frequency  Burning with urination  Abnormal urine odor  Screen for colon cancer  Chronic kidney disease, stage 2 (mild) Chronic Kidney Disease Stage 2   She is on Jardiance for management. We will continue Jardiance as per the previous dosage and frequency and order renal function tests to monitor disease progression. Postablative hypothyroidism Her thyroid medication was adjusted in May, and she requests re-evaluation. We will order thyroid function tests to assess the current status and efficacy of the medication adjustment.  Also will get endocrine support Bipolar 1 disorder (HCC) Increase zyprexa to 10 mg per patient request.  Emotionally doing much better. We will order a Cologuard test for colorectal cancer screening as she is agreeable.      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 -     Insulin Disposable Pump (OMNIPOD 5 G7 PODS, GEN 5,) MISC; 5 each by Does not apply route daily at 6 (six) AM. -     Comprehensive metabolic panel -     CBC with Differential/Platelet -     Lipid Panel w/reflex Direct LDL -     Urinalysis, Routine w reflex microscopic -     TSH + free T4 Dysuria -     POCT Urinalysis Dipstick Urinary frequency -     POCT Urinalysis Dipstick Burning with urination -     POCT Urinalysis Dipstick Abnormal urine odor -     POCT Urinalysis Dipstick Screen for colon cancer -     Cologuard Chronic kidney disease, stage 2 (mild) -     HgB A1c -     POCT Urinalysis Dipstick -     Cologuard -     Ambulatory referral to Endocrinology -     dapagliflozin propanediol (FARXIGA) 10 MG TABS tablet; Take 1 tablet (10 mg total) by mouth daily before breakfast. -     Insulin Disposable Pump (OMNIPOD 5 G7 PODS, GEN 5,) MISC; 5 each by Does not apply route  daily at 6 (six) AM. -     Comprehensive metabolic panel -     CBC with Differential/Platelet -     Lipid Panel w/reflex Direct LDL -     Urinalysis, Routine w reflex microscopic -     TSH + free T4 Postablative hypothyroidism -     TSH + free T4 Bipolar 1 disorder (HCC) -     OLANZapine (ZYPREXA) 10 MG tablet; Take 1 tablet (10 mg total) by mouth at bedtime.  Recommended follow-up: 3 months  Future Appointments  Date Time Provider Department Center  02/21/2024 11:45 AM [facility] ANNUAL WELLNESS VISIT 1 [facility]        Subjective   47 y.o. female who has Type 1 diabetes mellitus (HCC); SEIZURE DISORDER; Diabetic gastroparesis (HCC); Change in bowel habits; Constipation;  Tobacco use disorder; Protein-calorie malnutrition, severe (HCC); Marijuana dependence (HCC); Coronary artery spasm (HCC); Palpitations; Hypothyroidism; Depressive disorder; Bipolar 1 disorder (HCC); Brittle diabetes mellitus (HCC); Acne; Hereditary and idiopathic neuropathy, unspecified; Hypertension; Joint pain; Pure hypercholesterolemia; Retinal tear; Chronic kidney disease, stage 2 (mild); Statin intolerance; and Hyperlipidemia on their problem list. Her reasons/main concerns/chief complaints for today's office visit are 3 month follow-up, Lower abdominal pain, Lower back pain, Dysuria, Urinary Retention, Burning with urination, Urinary Frequency, and Urine odor   ------------------------------------------------------------------------------------------------------------------------ AI-Extracted: Discussed the use of AI scribe software for clinical note transcription with the patient, who gave verbal consent to proceed.  History of Present Illness   She has gotten financial issues worked out and doing way better. Feels she has urinary tract infection (UTI). Hemoglobin A1c has been good and using Dexcom G7 with Hemoglobin A1c around 8.5 and only 1% low.  Needs thyroid rechecked.  Agreeable to return to endocrinology Dr. Talmage Nap.  No complaints today.  ? About SGLT2 and if its needed. Wants to try to increase zyprexa to 10 from 5, its been helping a lot.     She has a past medical history of Acute bronchitis (01/04/2009), Acute posttraumatic stress disorder (07/06/2018), Anxiety, ARF (acute renal failure) (HCC) (05/03/2019), Bipolar 1 disorder (HCC), Brittle diabetes mellitus (HCC) (06/18/2022), Chronic kidney infection, Coronary artery spasm (HCC), Depressive disorder (06/11/2022), Diabetes mellitus type I (HCC), Diabetic gastroparesis (HCC), Diarrhea (01/22/2023), DM (diabetes mellitus) (HCC), Gastroparesis, Hyperlipidemia, Hypertension, Hypothyroidism, Insulin long-term use (HCC) (01/16/2023), Long term  current use of oral hypoglycemic drug (01/16/2023), Material hardship due to limited financial resources (03/01/2023), Nausea & vomiting (05/03/2019), Nausea with vomiting (06/21/2013), Seizure disorder (HCC), Umbilical hernia, and Unintentional weight loss (06/11/2022).  Problem list overviews that were updated at today's visit: Problem  Chronic Kidney Disease, Stage 2 (Mild)   Interim history:   patient not aware of any recent changes in urination, swelling (edema), fatigue, or any new cardiovascular symptoms.  Not aware of any recent nephrotoxic medications.  Adherent to prescribed treatments.   Medications: Diuretics: not taking any ARB: on losartan 25 mg daily SGLT2 inhibitor: will trial jardiance  Prior history: Nephrologist: No care team member to display Lifestyle and Nutrition: renal diet  Limit sodium and protein intake, nutrition consult considered Lab Results  Component Value Date   GFR 82.53 01/15/2023   CREATININE 0.84 01/15/2023   CREATININE 0.78 07/15/2022   CREATININE 0.82 06/11/2022   CREATININE 0.91 04/26/2022   CREATININE 1.01 (H) 01/01/2021   NA 140 01/15/2023   K 4.6 01/15/2023   CL 102 01/15/2023   CO2 30 01/15/2023   CALCIUM 10.0 01/15/2023   PROT 6.9 01/15/2023   ALBUMIN 4.4  01/15/2023   HGB 14.4 01/15/2023   HCT 43.0 01/15/2023   CRP 2 08/12/2018   ESRSEDRATE 6 06/20/2013   HGBA1C 11.3 (H) 01/15/2023      Component Value Date/Time   COLORURINE YELLOW 07/15/2022 1409   APPEARANCEUR CLEAR 07/15/2022 1409   LABSPEC 1.014 07/15/2022 1409   PHURINE 7.5 07/15/2022 1409   GLUCOSEU NEGATIVE 07/15/2022 1409   GLUCOSEU NEGATIVE 06/20/2013 1522   HGBUR NEGATIVE 07/15/2022 1409   BILIRUBINUR NEGATIVE 07/15/2022 1409   BILIRUBINUR Negative 06/11/2022 1539   KETONESUR NEGATIVE 07/15/2022 1409   PROTEINUR NEGATIVE 07/15/2022 1409   UROBILINOGEN 0.2 06/11/2022 1539   UROBILINOGEN 0.2 06/21/2015 0404   NITRITE NEGATIVE 07/15/2022 1409   LEUKOCYTESUR  NEGATIVE 07/15/2022 1409   MICROALBUR 3.5 (H) 01/15/2023 0957      Hypothyroidism   March 01, 2023 interim history: needs repeat around 04/01/23 due to change from 100-125 mcg feeling better.  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 Procedure note for radioactive iodine 12/2018 states: Hyperthyroidism nodular Graves disease versus toxic multinodular nodular goiter. TSH equal 0.00.CLINICAL DATA:  Hyperthyroidism with neck swelling, tenderness,difficulty swallowing, weight loss and heat intolerance. History of hypothyroidism. Serum TSH level 0.141 on 02/17/2018. There is heterogeneous uptake by the thyroid gland with nodular areas of increased activity in both lobes, right greater than left. No cold nodules are identified. Elevated radioactive iodine uptake (46.2%) by the thyroid gland at 24 hours with nodular areas of increased activity consistent with toxic multinodular goiter.   Lab Results  Component Value Date   TSH 2.54 06/11/2022   TSH 0.141 (L) 02/17/2018   TSH 0.011 (L) 05/05/2017   TSH 1.330 01/26/2014   TSH 3.04 06/20/2013   FREET4 0.6 (L) 01/15/2023   FREET4 1.08 06/11/2022   FREET4 0.70 (L) 02/17/2018   FREET4 1.26 (H) 05/08/2017   anti-TPO antibodies: N/A  anti-Tg antibodies: N/A  Anti-TSI antibodies: N/A    Family history of thyroid disease: N/A  Patient last evaluated for thyroid nodules: 2020 ultrasound showing toxic multinodular goiter   Last ultrasound for thyroid nodules: N/A  Last heart exam for rhythm check:  Echocardiogram 03/07/2020: Normal LV systolic function with EF 55-60%. Left ventricle cavity is normal in size. Normal global wall motion. Normal diastolic filling pattern, normal LAP. Calculated EF 58%. Mild (Grade I) aortic regurgitation. Mild (Grade I) mitral regurgitation. Mild tricuspid regurgitation. No  evidence of pulmonary hypertension. IVC is dilated with a respiratory response of >50%. No significant change compared to prior study dated 02/18/2018.       Material Hardship Due to [employer] (Resolved)   Not following with endocrinology or psychiatry due to cost concerns    Current Outpatient Medications on File Prior to Visit  Medication Sig   FLUBLOK 0.5 ML SOSY    Continuous Glucose Sensor (DEXCOM G7 SENSOR) MISC Apply sensor every 10 days. Pharmacy please provide 3 boxes (1 sensor per week)   empagliflozin (JARDIANCE) 10 MG TABS tablet Take 1 tablet (10 mg total) by mouth daily before breakfast.   Evolocumab (REPATHA SURECLICK) 140 MG/ML SOAJ Inject 140 mg into the skin every 14 (fourteen) days.   Glucagon (GVOKE HYPOPEN 2-PACK) 0.5 MG/0.1ML SOAJ Inject 0.5 mg into the skin daily as needed (for low blood sugar).   Insulin Disposable Pump (OMNIPOD 5 G6 INTRO, GEN 5,) KIT 0.8 Units/hr by Does not apply route  continuous. Using OmniPod along with Dexcom G6 to manage diabetes type 1 brittle please see Dr. For assistance with setting up but initial settings are planned to be 0.8 units/h continuous and 10-1 bolus ratio   Insulin Disposable Pump (OMNIPOD 5 G7 INTRO, GEN 5,) KIT 1 each by Does not apply route continuous. brittle type 1 diabetes mellitus retinopathy and history of level 3 hypoglycemia (glucose <54 mg/dL and altered mental  that resulted in hospital admission and intubation.   HGBA1C  11.3 01/15/2023   insulin glargine (LANTUS SOLOSTAR) 100 UNIT/ML Solostar Pen Inject 25 Units into the skin in the morning. Replaces insulin NPH.   insulin NPH Human (NOVOLIN N) 100 UNIT/ML injection Inject 16 Units into the skin 2 (two) times daily before a meal.    insulin regular (NOVOLIN R) 100 units/mL injection Inject 0.1 mLs (10 Units total) into the skin 3 (three) times daily before meals. 1 unit per 8 grams of carbs.at meals and to correct high blood sugar.   Insulin  Syringe-Needle U-100 (INSULIN SYRINGE .5CC/28G) 28G X 1/2" 0.5 ML MISC Use as instructed to administer insulin   levothyroxine (SYNTHROID) 125 MCG tablet Take 1 tablet (125 mcg total) by mouth daily. Replaces 100 mcg daily dosing.  Recheck thyroid levels 6-8 weeks after dose change   losartan (COZAAR) 25 MG tablet Take 1 tablet (25 mg total) by mouth daily. Keeps blood pressure lower and helps protects against kidney damage   ondansetron (ZOFRAN-ODT) 4 MG disintegrating tablet Take 1 tablet (4 mg total) by mouth every 8 (eight) hours as needed for nausea or vomiting (for nausea from wegovy or other source).   No current facility-administered medications on file prior to visit.   Medications Discontinued During This Encounter  Medication Reason   OLANZapine (ZYPREXA) 5 MG tablet      Objective   Physical Exam  BP 102/80 (BP Location: Left Arm)   Pulse 78   Temp 98.1 F (36.7 C) (Temporal)   Ht 5\' 2"  (1.575 m)   Wt 111 lb (50.3 kg)   SpO2 96%   BMI 20.30 kg/m  Wt Readings from Last 10 Encounters:  06/18/23 111 lb (50.3 kg)  03/01/23 108 lb 12.8 oz (49.4 kg)  02/15/23 107 lb (48.5 kg)  02/08/23 107 lb 3.2 oz (48.6 kg)  01/22/23 108 lb (49 kg)  01/15/23 104 lb 6.4 oz (47.4 kg)  07/15/22 100 lb 6.4 oz (45.5 kg)  07/15/22 100 lb 9.6 oz (45.6 kg)  06/25/22 100 lb 6.4 oz (45.5 kg)  06/18/22 95 lb 6.4 oz (43.3 kg)   Vital signs reviewed.  Nursing notes reviewed. Weight trend reviewed. General Appearance:  No acute distress appreciable.   Well-groomed, healthy-appearing female.  Well proportioned with no abnormal fat distribution.  Good muscle tone. Pulmonary:  Normal work of breathing at rest, no respiratory distress apparent. SpO2: 96 %  Musculoskeletal: All extremities are intact.  Neurological:  Awake, alert, oriented, and engaged.  No obvious focal neurological deficits or cognitive impairments.  Sensorium seems unclouded.   Speech is clear and coherent with logical  content. Psychiatric:  Appropriate mood, pleasant and cooperative demeanor, thoughtful and engaged during the exam  Results   LABS HbA1c: 8.5% (06/17/2023) Blood Glucose: 133 mg/dL (16/06/9603) eGFR: 82 VW/UJW/1.19J        Results for orders placed or performed in visit on 06/18/23  HgB A1c  Result Value Ref Range   Hgb A1c MFr Bld 9.3 (H) 4.6 - 6.5 %  Comprehensive  metabolic panel  Result Value Ref Range   Sodium 139 135 - 145 mEq/L   Potassium 4.7 3.5 - 5.1 mEq/L   Chloride 99 96 - 112 mEq/L   CO2 34 (H) 19 - 32 mEq/L   Glucose, Bld 93 70 - 99 mg/dL   BUN 12 6 - 23 mg/dL   Creatinine, Ser 8.29 0.40 - 1.20 mg/dL   Total Bilirubin 0.4 0.2 - 1.2 mg/dL   Alkaline Phosphatase 81 39 - 117 U/L   AST 25 0 - 37 U/L   ALT 19 0 - 35 U/L   Total Protein 7.4 6.0 - 8.3 g/dL   Albumin 4.5 3.5 - 5.2 g/dL   GFR 56.21 >30.86 mL/min   Calcium 10.9 (H) 8.4 - 10.5 mg/dL  CBC with Differential/Platelet  Result Value Ref Range   WBC 6.0 4.0 - 10.5 K/uL   RBC 5.32 (H) 3.87 - 5.11 Mil/uL   Hemoglobin 14.6 12.0 - 15.0 g/dL   HCT 57.8 46.9 - 62.9 %   MCV 85.0 78.0 - 100.0 fl   MCHC 32.3 30.0 - 36.0 g/dL   RDW 52.8 41.3 - 24.4 %   Platelets 378.0 150.0 - 400.0 K/uL   Neutrophils Relative % 72.5 43.0 - 77.0 %   Lymphocytes Relative 13.1 12.0 - 46.0 %   Monocytes Relative 8.1 3.0 - 12.0 %   Eosinophils Relative 5.5 (H) 0.0 - 5.0 %   Basophils Relative 0.8 0.0 - 3.0 %   Neutro Abs 4.4 1.4 - 7.7 K/uL   Lymphs Abs 0.8 0.7 - 4.0 K/uL   Monocytes Absolute 0.5 0.1 - 1.0 K/uL   Eosinophils Absolute 0.3 0.0 - 0.7 K/uL   Basophils Absolute 0.0 0.0 - 0.1 K/uL  Urinalysis, Routine w reflex microscopic  Result Value Ref Range   Color, Urine YELLOW Yellow;Lt. Yellow;Straw;Dark Yellow;Amber;Green;Red;Brown   APPearance CLEAR Clear;Turbid;Slightly Cloudy;Cloudy   Specific Gravity, Urine <=1.005 (A) 1.000 - 1.030   pH 6.5 5.0 - 8.0   Total Protein, Urine NEGATIVE Negative   Urine Glucose >=1000 (A)  Negative   Ketones, ur NEGATIVE Negative   Bilirubin Urine NEGATIVE Negative   Hgb urine dipstick NEGATIVE Negative   Urobilinogen, UA 0.2 0.0 - 1.0   Leukocytes,Ua NEGATIVE Negative   Nitrite NEGATIVE Negative   WBC, UA 0-2/hpf 0-2/hpf   RBC / HPF none seen 0-2/hpf   Squamous Epithelial / HPF Rare(0-4/hpf) Rare(0-4/hpf)   Bacteria, UA Many(>50/hpf) (A) None  POCT Urinalysis Dipstick  Result Value Ref Range   Color, UA pale yellow    Clarity, UA clear    Glucose, UA Positive (A) Negative   Bilirubin, UA Negative    Ketones, UA Negative    Spec Grav, UA 1.010 1.010 - 1.025   Blood, UA Negative    pH, UA 6.0 5.0 - 8.0   Protein, UA Negative Negative   Urobilinogen, UA 0.2 0.2 or 1.0 E.U./dL   Nitrite, UA Positive    Leukocytes, UA Negative Negative   Appearance     Odor      Office Visit on 06/18/2023  Component Date Value   Hgb A1c MFr Bld 06/18/2023 9.3 (H)    Color, UA 06/18/2023 pale yellow    Clarity, UA 06/18/2023 clear    Glucose, UA 06/18/2023 Positive (A)    Bilirubin, UA 06/18/2023 Negative    Ketones, UA 06/18/2023 Negative    Spec Grav, UA 06/18/2023 1.010    Blood, UA 06/18/2023 Negative    pH, UA 06/18/2023  6.0    Protein, UA 06/18/2023 Negative    Urobilinogen, UA 06/18/2023 0.2    Nitrite, UA 06/18/2023 Positive    Leukocytes, UA 06/18/2023 Negative    Sodium 06/18/2023 139    Potassium 06/18/2023 4.7    Chloride 06/18/2023 99    CO2 06/18/2023 34 (H)    Glucose, Bld 06/18/2023 93    BUN 06/18/2023 12    Creatinine, Ser 06/18/2023 0.84    Total Bilirubin 06/18/2023 0.4    Alkaline Phosphatase 06/18/2023 81    AST 06/18/2023 25    ALT 06/18/2023 19    Total Protein 06/18/2023 7.4    Albumin 06/18/2023 4.5    GFR 06/18/2023 82.29    Calcium 06/18/2023 10.9 (H)    WBC 06/18/2023 6.0    RBC 06/18/2023 5.32 (H)    Hemoglobin 06/18/2023 14.6    HCT 06/18/2023 45.2    MCV 06/18/2023 85.0    MCHC 06/18/2023 32.3    RDW 06/18/2023 13.9     Platelets 06/18/2023 378.0    Neutrophils Relative % 06/18/2023 72.5    Lymphocytes Relative 06/18/2023 13.1    Monocytes Relative 06/18/2023 8.1    Eosinophils Relative 06/18/2023 5.5 (H)    Basophils Relative 06/18/2023 0.8    Neutro Abs 06/18/2023 4.4    Lymphs Abs 06/18/2023 0.8    Monocytes Absolute 06/18/2023 0.5    Eosinophils Absolute 06/18/2023 0.3    Basophils Absolute 06/18/2023 0.0    Color, Urine 06/18/2023 YELLOW    APPearance 06/18/2023 CLEAR    Specific Gravity, Urine 06/18/2023 <=1.005 (A)    pH 06/18/2023 6.5    Total Protein, Urine 06/18/2023 NEGATIVE    Urine Glucose 06/18/2023 >=1000 (A)    Ketones, ur 06/18/2023 NEGATIVE    Bilirubin Urine 06/18/2023 NEGATIVE    Hgb urine dipstick 06/18/2023 NEGATIVE    Urobilinogen, UA 06/18/2023 0.2    Leukocytes,Ua 06/18/2023 NEGATIVE    Nitrite 06/18/2023 NEGATIVE    WBC, UA 06/18/2023 0-2/hpf    RBC / HPF 06/18/2023 none seen    Squamous Epithelial / HPF 06/18/2023 Rare(0-4/hpf)    Bacteria, UA 06/18/2023 Many(>50/hpf) (A)   Office Visit on 02/08/2023  Component Date Value   Color, Urine 02/08/2023 YELLOW    APPearance 02/08/2023 CLEAR    Specific Gravity, Urine 02/08/2023 1.010    pH 02/08/2023 6.5    Total Protein, Urine 02/08/2023 NEGATIVE    Urine Glucose 02/08/2023 >=1000 (A)    Ketones, ur 02/08/2023 NEGATIVE    Bilirubin Urine 02/08/2023 NEGATIVE    Hgb urine dipstick 02/08/2023 NEGATIVE    Urobilinogen, UA 02/08/2023 0.2    Leukocytes,Ua 02/08/2023 NEGATIVE    Nitrite 02/08/2023 NEGATIVE    WBC, UA 02/08/2023 0-2/hpf    RBC / HPF 02/08/2023 none seen    Squamous Epithelial / HPF 02/08/2023 Rare(0-4/hpf)   Office Visit on 01/22/2023  Component Date Value   MICRO NUMBER: 01/22/2023 [id number]    SPECIMEN QUALITY: 01/22/2023 Adequate    Sample Source 01/22/2023 URINE    STATUS: 01/22/2023 FINAL    ISOLATE 1: 01/22/2023 Escherichia coli (A)    ISOLATE 2: 01/22/2023 Streptococcus agalactiae (A)   Office  Visit on 01/15/2023  Component Date Value   WBC 01/15/2023 4.8    RBC 01/15/2023 5.06    Hemoglobin 01/15/2023 14.4    HCT 01/15/2023 43.0    MCV 01/15/2023 85.1    MCHC 01/15/2023 33.6    RDW 01/15/2023 13.9    Platelets 01/15/2023 327.0    Neutrophils  Relative % 01/15/2023 74.9    Lymphocytes Relative 01/15/2023 13.4    Monocytes Relative 01/15/2023 7.9    Eosinophils Relative 01/15/2023 2.7    Basophils Relative 01/15/2023 1.1    Neutro Abs 01/15/2023 3.6    Lymphs Abs 01/15/2023 0.6 (L)    Monocytes Absolute 01/15/2023 0.4    Eosinophils Absolute 01/15/2023 0.1    Basophils Absolute 01/15/2023 0.1    Sodium 01/15/2023 140    Potassium 01/15/2023 4.6    Chloride 01/15/2023 102    CO2 01/15/2023 30    Glucose, Bld 01/15/2023 122 (H)    BUN 01/15/2023 13    Creatinine, Ser 01/15/2023 0.84    Total Bilirubin 01/15/2023 0.5    Alkaline Phosphatase 01/15/2023 76    AST 01/15/2023 16    ALT 01/15/2023 13    Total Protein 01/15/2023 6.9    Albumin 01/15/2023 4.4    GFR 01/15/2023 82.53    Calcium 01/15/2023 10.0    Cholesterol 01/15/2023 177    Triglycerides 01/15/2023 90.0    HDL 01/15/2023 58.30    VLDL 01/15/2023 18.0    LDL Cholesterol 01/15/2023 101 (H)    Total CHOL/HDL Ratio 01/15/2023 3    NonHDL 01/15/2023 118.60    Hgb A1c MFr Bld 01/15/2023 11.3 (H)    Microalb, Ur 01/15/2023 3.5 (H)    Creatinine,U 01/15/2023 265.1    Microalb Creat Ratio 01/15/2023 1.3    TSH W/REFLEX TO FT4 01/15/2023 24.69 (H)    Free T4 01/15/2023 0.6 (L)   Admission on 07/15/2022, Discharged on 07/15/2022  Component Date Value   Lipase 07/15/2022 55 (H)    Sodium 07/15/2022 140    Potassium 07/15/2022 3.9    Chloride 07/15/2022 101    CO2 07/15/2022 30    Glucose, Bld 07/15/2022 122 (H)    BUN 07/15/2022 18    Creatinine, Ser 07/15/2022 0.78    Calcium 07/15/2022 10.0    Total Protein 07/15/2022 7.0    Albumin 07/15/2022 4.3    AST 07/15/2022 20    ALT 07/15/2022 27     Alkaline Phosphatase 07/15/2022 70    Total Bilirubin 07/15/2022 0.4    GFR, Estimated 07/15/2022 >60    Anion gap 07/15/2022 9    WBC 07/15/2022 7.6    RBC 07/15/2022 5.05    Hemoglobin 07/15/2022 13.8    HCT 07/15/2022 41.1    MCV 07/15/2022 81.4    MCH 07/15/2022 27.3    MCHC 07/15/2022 33.6    RDW 07/15/2022 13.2    Platelets 07/15/2022 350    nRBC 07/15/2022 0.0    Color, Urine 07/15/2022 YELLOW    APPearance 07/15/2022 CLEAR    Specific Gravity, Urine 07/15/2022 1.014    pH 07/15/2022 7.5    Glucose, UA 07/15/2022 NEGATIVE    Hgb urine dipstick 07/15/2022 NEGATIVE    Bilirubin Urine 07/15/2022 NEGATIVE    Ketones, ur 07/15/2022 NEGATIVE    Protein, ur 07/15/2022 NEGATIVE    Nitrite 07/15/2022 NEGATIVE    Leukocytes,Ua 07/15/2022 NEGATIVE    Glucose-Capillary 07/15/2022 108 (H)    Glucose-Capillary 07/15/2022 81   Office Visit on 07/15/2022  Component Date Value   SARS Coronavirus 2 Ag 07/15/2022 Negative    Influenza A, POC 07/15/2022 Negative    Influenza B, POC 07/15/2022 Negative       Additional Info: This encounter employed real-time, collaborative documentation. The patient actively reviewed and updated their medical record on a shared screen, ensuring transparency and facilitating joint problem-solving for the  problem list, overview, and plan. This approach promotes accurate, informed care. The treatment plan was discussed and reviewed in detail, including medication safety, potential side effects, and all patient questions. We confirmed understanding and comfort with the plan. Follow-up instructions were established, including contacting the office for any concerns, returning if symptoms worsen, persist, or new symptoms develop, and precautions for potential emergency department visits.

## 2023-06-18 NOTE — Assessment & Plan Note (Signed)
Her thyroid medication was adjusted in May, and she requests re-evaluation. We will order thyroid function tests to assess the current status and efficacy of the medication adjustment.  Also will get endocrine support

## 2023-06-18 NOTE — Addendum Note (Signed)
Addended by: Lula Olszewski on: 06/18/2023 09:38 PM   Modules accepted: Orders

## 2023-06-18 NOTE — Assessment & Plan Note (Signed)
Increase zyprexa to 10 mg per patient request.  Emotionally doing much better.

## 2023-06-18 NOTE — Assessment & Plan Note (Signed)
Chronic Kidney Disease Stage 2   She is on Jardiance for management. We will continue Jardiance as per the previous dosage and frequency and order renal function tests to monitor disease progression.

## 2023-06-19 LAB — LIPID PANEL W/REFLEX DIRECT LDL
Cholesterol: 179 mg/dL (ref <200)
HDL: 58 mg/dL (ref >=50)
LDL Cholesterol (Calc): 99 mg/dL
Non-HDL Cholesterol (Calc): 121 mg/dL (ref <130)
Total CHOL/HDL Ratio: 3.1 (calc) (ref <5.0)
Triglycerides: 120 mg/dL (ref <150)

## 2023-06-19 LAB — TSH+FREE T4: TSH W/REFLEX TO FT4: 1.74 m[IU]/L

## 2023-07-22 DIAGNOSIS — E7800 Pure hypercholesterolemia, unspecified: Secondary | ICD-10-CM | POA: Diagnosis not present

## 2023-07-22 DIAGNOSIS — G609 Hereditary and idiopathic neuropathy, unspecified: Secondary | ICD-10-CM | POA: Diagnosis not present

## 2023-07-22 DIAGNOSIS — I201 Angina pectoris with documented spasm: Secondary | ICD-10-CM | POA: Diagnosis not present

## 2023-07-22 DIAGNOSIS — E109 Type 1 diabetes mellitus without complications: Secondary | ICD-10-CM | POA: Diagnosis not present

## 2023-07-22 DIAGNOSIS — L709 Acne, unspecified: Secondary | ICD-10-CM | POA: Diagnosis not present

## 2023-07-22 DIAGNOSIS — F329 Major depressive disorder, single episode, unspecified: Secondary | ICD-10-CM | POA: Diagnosis not present

## 2023-07-22 DIAGNOSIS — I10 Essential (primary) hypertension: Secondary | ICD-10-CM | POA: Diagnosis not present

## 2023-07-22 DIAGNOSIS — E039 Hypothyroidism, unspecified: Secondary | ICD-10-CM | POA: Diagnosis not present

## 2023-07-22 DIAGNOSIS — K3184 Gastroparesis: Secondary | ICD-10-CM | POA: Diagnosis not present

## 2023-07-23 DIAGNOSIS — E109 Type 1 diabetes mellitus without complications: Secondary | ICD-10-CM | POA: Diagnosis not present

## 2023-07-25 DIAGNOSIS — E10649 Type 1 diabetes mellitus with hypoglycemia without coma: Secondary | ICD-10-CM | POA: Diagnosis not present

## 2023-08-05 DIAGNOSIS — E1065 Type 1 diabetes mellitus with hyperglycemia: Secondary | ICD-10-CM | POA: Diagnosis not present

## 2023-08-24 DIAGNOSIS — E10649 Type 1 diabetes mellitus with hypoglycemia without coma: Secondary | ICD-10-CM | POA: Diagnosis not present

## 2023-09-05 DIAGNOSIS — E1065 Type 1 diabetes mellitus with hyperglycemia: Secondary | ICD-10-CM | POA: Diagnosis not present

## 2023-09-08 DIAGNOSIS — N390 Urinary tract infection, site not specified: Secondary | ICD-10-CM | POA: Diagnosis not present

## 2023-09-27 DIAGNOSIS — N390 Urinary tract infection, site not specified: Secondary | ICD-10-CM | POA: Diagnosis not present

## 2023-10-06 DIAGNOSIS — E1065 Type 1 diabetes mellitus with hyperglycemia: Secondary | ICD-10-CM | POA: Diagnosis not present

## 2023-10-19 DIAGNOSIS — Z961 Presence of intraocular lens: Secondary | ICD-10-CM | POA: Diagnosis not present

## 2023-10-19 DIAGNOSIS — E109 Type 1 diabetes mellitus without complications: Secondary | ICD-10-CM | POA: Diagnosis not present

## 2023-10-19 DIAGNOSIS — Z794 Long term (current) use of insulin: Secondary | ICD-10-CM | POA: Diagnosis not present

## 2023-10-19 LAB — HM DIABETES EYE EXAM

## 2023-10-29 DIAGNOSIS — E1065 Type 1 diabetes mellitus with hyperglycemia: Secondary | ICD-10-CM | POA: Diagnosis not present

## 2023-11-03 DIAGNOSIS — E1065 Type 1 diabetes mellitus with hyperglycemia: Secondary | ICD-10-CM | POA: Diagnosis not present

## 2023-11-04 DIAGNOSIS — N390 Urinary tract infection, site not specified: Secondary | ICD-10-CM | POA: Diagnosis not present

## 2023-11-04 DIAGNOSIS — E039 Hypothyroidism, unspecified: Secondary | ICD-10-CM | POA: Diagnosis not present

## 2023-11-04 DIAGNOSIS — E7800 Pure hypercholesterolemia, unspecified: Secondary | ICD-10-CM | POA: Diagnosis not present

## 2023-11-04 DIAGNOSIS — Z4681 Encounter for fitting and adjustment of insulin pump: Secondary | ICD-10-CM | POA: Diagnosis not present

## 2023-11-04 DIAGNOSIS — E109 Type 1 diabetes mellitus without complications: Secondary | ICD-10-CM | POA: Diagnosis not present

## 2023-11-04 DIAGNOSIS — I10 Essential (primary) hypertension: Secondary | ICD-10-CM | POA: Diagnosis not present

## 2023-11-05 DIAGNOSIS — E039 Hypothyroidism, unspecified: Secondary | ICD-10-CM | POA: Diagnosis not present

## 2023-11-05 DIAGNOSIS — E7800 Pure hypercholesterolemia, unspecified: Secondary | ICD-10-CM | POA: Diagnosis not present

## 2023-11-05 DIAGNOSIS — E109 Type 1 diabetes mellitus without complications: Secondary | ICD-10-CM | POA: Diagnosis not present

## 2023-12-04 DIAGNOSIS — E1065 Type 1 diabetes mellitus with hyperglycemia: Secondary | ICD-10-CM | POA: Diagnosis not present

## 2024-01-03 DIAGNOSIS — E1065 Type 1 diabetes mellitus with hyperglycemia: Secondary | ICD-10-CM | POA: Diagnosis not present

## 2024-01-08 ENCOUNTER — Other Ambulatory Visit: Payer: Self-pay | Admitting: Internal Medicine

## 2024-01-12 ENCOUNTER — Other Ambulatory Visit: Payer: Self-pay | Admitting: Internal Medicine

## 2024-01-20 DIAGNOSIS — E1065 Type 1 diabetes mellitus with hyperglycemia: Secondary | ICD-10-CM | POA: Diagnosis not present

## 2024-02-01 DIAGNOSIS — E1065 Type 1 diabetes mellitus with hyperglycemia: Secondary | ICD-10-CM | POA: Diagnosis not present

## 2024-02-03 DIAGNOSIS — E1065 Type 1 diabetes mellitus with hyperglycemia: Secondary | ICD-10-CM | POA: Diagnosis not present

## 2024-02-23 ENCOUNTER — Ambulatory Visit

## 2024-02-23 VITALS — Ht 61.0 in | Wt 143.0 lb

## 2024-02-23 DIAGNOSIS — E039 Hypothyroidism, unspecified: Secondary | ICD-10-CM

## 2024-02-23 DIAGNOSIS — E109 Type 1 diabetes mellitus without complications: Secondary | ICD-10-CM | POA: Diagnosis not present

## 2024-02-23 DIAGNOSIS — Z0000 Encounter for general adult medical examination without abnormal findings: Secondary | ICD-10-CM | POA: Diagnosis not present

## 2024-02-23 DIAGNOSIS — Z1211 Encounter for screening for malignant neoplasm of colon: Secondary | ICD-10-CM

## 2024-02-23 DIAGNOSIS — E1021 Type 1 diabetes mellitus with diabetic nephropathy: Secondary | ICD-10-CM

## 2024-02-23 NOTE — Patient Instructions (Signed)
 Ms. Keys , Thank you for taking time out of your busy schedule to complete your Annual Wellness Visit with me. I enjoyed our conversation and look forward to speaking with you again next year. I, as well as your care team,  appreciate your ongoing commitment to your health goals. Please review the following plan we discussed and let me know if I can assist you in the future. Your Game plan/ To Do List    Referrals: If you haven't heard from the office you've been referred to, please reach out to them at the phone provided.   Follow up Visits: Next Medicare AWV with our clinical staff: 03/01/25   Have you seen your provider in the last 6 months (3 months if uncontrolled diabetes)? No Next Office Visit with your provider: 03/21/24  Clinician Recommendations:  Aim for 30 minutes of exercise or brisk walking, 6-8 glasses of water, and 5 servings of fruits and vegetables each day.       This is a list of the screening recommended for you and due dates:  Health Maintenance  Topic Date Due   Complete foot exam   Never done   Pneumococcal Vaccination (2 of 2 - PCV) 02/19/2019   Colon Cancer Screening  Never done   DTaP/Tdap/Td vaccine (2 - Td or Tdap) 08/12/2023   Hemoglobin A1C  12/17/2023   Yearly kidney health urinalysis for diabetes  01/15/2024   Medicare Annual Wellness Visit  02/15/2024   Pap with HPV screening  06/17/2024*   Flu Shot  03/31/2024   Yearly kidney function blood test for diabetes  06/17/2024   Eye exam for diabetics  10/18/2024   Hepatitis B Vaccine  Completed   Hepatitis C Screening  Completed   HIV Screening  Completed   HPV Vaccine  Aged Out   Meningitis B Vaccine  Aged Out   COVID-19 Vaccine  Discontinued  *Topic was postponed. The date shown is not the original due date.    Advanced directives: (Declined) Advance directive discussed with you today. Even though you declined this today, please call our office should you change your mind, and we can give you the  proper paperwork for you to fill out. Advance Care Planning is important because it:  [x]  Makes sure you receive the medical care that is consistent with your values, goals, and preferences  [x]  It provides guidance to your family and loved ones and reduces their decisional burden about whether or not they are making the right decisions based on your wishes.  Follow the link provided in your after visit summary or read over the paperwork we have mailed to you to help you started getting your Advance Directives in place. If you need assistance in completing these, please reach out to us  so that we can help you!  See attachments for Preventive Care and Fall Prevention Tips.

## 2024-02-23 NOTE — Progress Notes (Addendum)
 Subjective:   Karen Hayden is a 48 y.o. who presents for a Medicare Wellness preventive visit.  As a reminder, Annual Wellness Visits don't include a physical exam, and some assessments may be limited, especially if this visit is performed virtually. We may recommend an in-person follow-up visit with your provider if needed.  Visit Complete: Virtual I connected with  Karen Hayden on 02/23/24 by a video and audio enabled telemedicine application and verified that I am speaking with the correct person using two identifiers.  Patient Location: Home  Provider Location: Home Office  I discussed the limitations of evaluation and management by telemedicine. The patient expressed understanding and agreed to proceed.  Vital Signs: Because this visit was a virtual/telehealth visit, some criteria may be missing or patient reported. Any vitals not documented were not able to be obtained and vitals that have been documented are patient reported.  VideoDeclined- This patient declined Librarian, academic. Therefore the visit was completed with audio only.  Persons Participating in Visit: Patient.  AWV Questionnaire: No: Patient Medicare AWV questionnaire was not completed prior to this visit.  Cardiac Risk Factors include: advanced age (>41men, >28 women);diabetes mellitus;hypertension;dyslipidemia     Objective:    Today's Vitals   02/23/24 1120  Weight: 143 lb (64.9 kg)  Height: 5' 1 (1.549 m)   Body mass index is 27.02 kg/m.     02/23/2024   11:29 AM 02/15/2023   11:09 AM 07/15/2022    1:25 PM 04/26/2022    7:32 PM 10/02/2021   12:47 PM 01/01/2021    6:06 PM 05/04/2019   12:54 PM  Advanced Directives  Does Patient Have a Medical Advance Directive? No No No No No No No  Would patient like information on creating a medical advance directive? No - Patient declined No - Patient declined   No - Patient declined  No - Patient declined    Current Medications  (verified) Outpatient Encounter Medications as of 02/23/2024  Medication Sig   ADACEL 12-30-13.5 LF-MCG/0.5 injection    Continuous Glucose Sensor (DEXCOM G7 SENSOR) MISC Apply sensor every 10 days. Pharmacy please provide 3 boxes (1 sensor per week)   gabapentin  (NEURONTIN ) 300 MG capsule Take 300 mg by mouth daily.   Glucagon  (GVOKE HYPOPEN  2-PACK) 0.5 MG/0.1ML SOAJ Inject 0.5 mg into the skin daily as needed (for low blood sugar).   insulin  NPH Human (NOVOLIN N) 100 UNIT/ML injection Inject 16 Units into the skin 2 (two) times daily before a meal.    insulin  regular (NOVOLIN R) 100 units/mL injection Inject 0.1 mLs (10 Units total) into the skin 3 (three) times daily before meals. 1 unit per 8 grams of carbs.at meals and to correct high blood sugar.   Insulin  Syringe-Needle U-100 (INSULIN  SYRINGE .5CC/28G) 28G X 1/2 0.5 ML MISC Use as instructed to administer insulin    levothyroxine  (SYNTHROID ) 125 MCG tablet Take 1 tablet (125 mcg total) by mouth daily. Replaces 100 mcg daily dosing.  Recheck thyroid  levels 6-8 weeks after dose change   levothyroxine  (SYNTHROID ) 137 MCG tablet Take 137 mcg by mouth every morning.   losartan  (COZAAR ) 25 MG tablet TAKE 1 TABLET BY MOUTH ONCE DAILY TO KEEP BLOOD PRESSURE LOWER AND TO PROTECT AGAINST KIDNEY DAMAGE   NOVOLOG  100 UNIT/ML injection SMARTSIG:100 Unit(s) SUB-Q Daily   OLANZapine  (ZYPREXA ) 10 MG tablet Take 1 tablet (10 mg total) by mouth at bedtime.   trimethoprim  (TRIMPEX ) 100 MG tablet Take 100 mg by mouth  daily.   TWINRIX 720-20 ELU-MCG/ML injection    dapagliflozin  propanediol (FARXIGA ) 10 MG TABS tablet Take 1 tablet (10 mg total) by mouth daily before breakfast. (Patient not taking: Reported on 02/23/2024)   empagliflozin  (JARDIANCE ) 10 MG TABS tablet Take 1 tablet (10 mg total) by mouth daily before breakfast. (Patient not taking: Reported on 02/23/2024)   Evolocumab  (REPATHA  SURECLICK) 140 MG/ML SOAJ Inject 140 mg into the skin every 14 (fourteen)  days. (Patient not taking: Reported on 02/23/2024)   FLUBLOK 0.5 ML SOSY    Insulin  Disposable Pump (OMNIPOD 5 G6 INTRO, GEN 5,) KIT 0.8 Units/hr by Does not apply route continuous. Using OmniPod along with Dexcom G6 to manage diabetes type 1 brittle please see Dr. For assistance with setting up but initial settings are planned to be 0.8 units/h continuous and 10-1 bolus ratio (Patient not taking: Reported on 02/23/2024)   Insulin  Disposable Pump (OMNIPOD 5 G7 INTRO, GEN 5,) KIT 1 each by Does not apply route continuous. brittle type 1 diabetes mellitus retinopathy and history of level 3 hypoglycemia (glucose <54 mg/dL and altered mental  that resulted in hospital admission and intubation.   HGBA1C  11.3 01/15/2023 (Patient not taking: Reported on 02/23/2024)   Insulin  Disposable Pump (OMNIPOD 5 G7 PODS, GEN 5,) MISC 5 each by Does not apply route daily at 6 (six) AM. (Patient not taking: Reported on 02/23/2024)   insulin  glargine (LANTUS  SOLOSTAR) 100 UNIT/ML Solostar Pen Inject 25 Units into the skin in the morning. Replaces insulin  NPH.   ondansetron  (ZOFRAN -ODT) 4 MG disintegrating tablet Take 1 tablet (4 mg total) by mouth every 8 (eight) hours as needed for nausea or vomiting (for nausea from wegovy or other source).   No facility-administered encounter medications on file as of 02/23/2024.    Allergies (verified) Seroquel [quetiapine fumarate], Isosorbide  nitrate, Lipitor [atorvastatin], Codeine, and Isosorbide    History: Past Medical History:  Diagnosis Date   Acute bronchitis 01/04/2009   Qualifier: Diagnosis of   By: Pauline MD, Stephen       Acute posttraumatic stress disorder 07/06/2018   Anxiety    ARF (acute renal failure) (HCC) 05/03/2019   Lab Results  Component  Value  Date/Time     CREATININE  0.84  01/15/2023 09:57 AM     CREATININE  0.78  07/15/2022 02:09 PM     CREATININE  0.82  06/11/2022 03:16 PM     CREATININE  0.91  04/26/2022 07:50 PM     CREATININE  1.01 (H)  01/01/2021  06:20 PM         Bipolar 1 disorder (HCC)    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   Chronic kidney infection    Coronary artery spasm (HCC)    Depressive disorder 06/11/2022   Was on Zyprexa  in the past which helps her depression and her mood and her weight gain needs but to her knowledge does not have a diagnosis of schizophrenia or bipolar and denies any history of psychotic symptoms   Diabetes mellitus type I (HCC)    Diabetic gastroparesis (HCC)    Diarrhea 01/22/2023   Already resolved was transient, used imodium    DM (diabetes mellitus) (HCC)    Gastroparesis    Due to diabetes    Hyperlipidemia    Hypertension    Hypothyroidism    Has thyroiditis with subsequent hyperthyroidism so off Synthroid    Insulin  long-term use (HCC) 01/16/2023   Long term current use of oral hypoglycemic drug 01/16/2023   Material hardship  due to limited financial resources 03/01/2023   Not following with endocrinology or psychiatry due to cost concerns     Nausea & vomiting 05/03/2019   Nausea with vomiting 06/21/2013   Seizure disorder (HCC)    Umbilical hernia    Unintentional weight loss 06/11/2022   Associated with grief, unmanaged dm 1 and thyroid  disease 127->98 lb as of 06/11/22  Increased body mass noted.  Body mass index is 19.1 kg/m.  Well proportioned with no abnormal fat distribution.  Good muscle tone.         Lab Results  Component  Value  Date     TSH  2.54  06/11/2022         Lab Results  Component  Value  Date     HGBA1C  11.3 (H)  01/15/2023            Wt Readings from Last 10    Past Surgical History:  Procedure Laterality Date   ADENOIDECTOMY     CARDIAC CATHETERIZATION     LEFT HEART CATH AND CORONARY ANGIOGRAPHY N/A 02/18/2018   Procedure: LEFT HEART CATH AND CORONARY ANGIOGRAPHY;  Surgeon: Dann Candyce RAMAN, MD;  Location: [facility] INVASIVE CV LAB;  Service: Cardiovascular;  Laterality: N/A;   TONSILLECTOMY     VAGINAL HYSTERECTOMY     left ovary removed , has her right  ovary   Family History  Problem Relation Age of Onset   Hypertension Mother    Hyperlipidemia Mother    Heart disease Mother    Early death Mother    Diabetes Mother    Heart attack Mother    Depression Mother    Anxiety disorder Mother    Hearing loss Father    Hyperlipidemia Father    Hypertension Father    Mood Disorder Father    Thyroid  disease Father    Pulmonary embolism Daughter    Depression Paternal Aunt    Anxiety disorder Paternal Aunt    Diabetes Paternal Aunt    Diabetes Paternal Aunt    Depression Maternal Grandmother    Anxiety disorder Maternal Grandmother    Goiter Maternal Grandmother    Diabetes Maternal Grandmother    Ovarian cancer Paternal Grandmother        great grandmother   Diabetes Paternal Grandmother    Colon cancer Neg Hx    Social History   Socioeconomic History   Marital status: Single    Spouse name: Not on file   Number of children: 2   Years of education: Not on file   Highest education level: Not on file  Occupational History   Not on file  Tobacco Use   Smoking status: Former    Current packs/day: 0.00    Average packs/day: 1 pack/day for 13.0 years (13.0 ttl pk-yrs)    Types: Cigarettes    Start date: 02/17/2005    Quit date: 02/17/2018    Years since quitting: 6.0   Smokeless tobacco: Never   Tobacco comments:    Tobacco info given  Vaping Use   Vaping status: Every Day  Substance and Sexual Activity   Alcohol  use: Never   Drug use: Not Currently    Types: Marijuana    Comment: a few times a week to stimulate appetite   Sexual activity: Yes    Birth control/protection: Surgical  Other Topics Concern   Not on file  Social History Narrative   Child daughter passed at 46 years old passed away 20 months  Social Drivers of Corporate investment banker Strain: Low Risk  (02/23/2024)   Overall Financial Resource Strain (CARDIA)    Difficulty of Paying Living Expenses: Not hard at all  Food Insecurity: No Food  Insecurity (02/23/2024)   Hunger Vital Sign    Worried About Running Out of Food in the Last Year: Never true    Ran Out of Food in the Last Year: Never true  Transportation Needs: No Transportation Needs (02/23/2024)   PRAPARE - Administrator, Civil Service (Medical): No    Lack of Transportation (Non-Medical): No  Physical Activity: Sufficiently Active (02/23/2024)   Exercise Vital Sign    Days of Exercise per Week: 7 days    Minutes of Exercise per Session: 30 min  Stress: No Stress Concern Present (02/23/2024)   [employer] of Occupational Health - Occupational Stress Questionnaire    Feeling of Stress: Not at all  Social Connections: Unknown (02/23/2024)   Social Connection and Isolation Panel    Frequency of Communication with Friends and Family: Three times a week    Frequency of Social Gatherings with Friends and Family: Three times a week    Attends Religious Services: Not on file    Active Member of Clubs or Organizations: No    Attends Banker Meetings: Never    Marital Status: Never married    Tobacco Counseling Counseling given: Not Answered Tobacco comments: Tobacco info given    Clinical Intake:  Pre-visit preparation completed: Yes  Pain : No/denies pain     BMI - recorded: 27.02 Nutritional Status: BMI 25 -29 Overweight Nutritional Risks: None Diabetes: Yes CBG done?: Yes (152 per pt) CBG resulted in Enter/ Edit results?: No Did pt. bring in CBG monitor from home?: No  Lab Results  Component Value Date   HGBA1C 9.3 (H) 06/18/2023   HGBA1C 11.3 (H) 01/15/2023   HGBA1C 11.3 (H) 06/11/2022     How often do you need to have someone help you when you read instructions, pamphlets, or other written materials from your doctor or pharmacy?: 1 - Never  Interpreter Needed?: No  Information entered by :: Ellouise Haws, LPN   Activities of Daily Living     02/23/2024   11:22 AM  In your present state of health, do you have  any difficulty performing the following activities:  Hearing? 0  Vision? 0  Difficulty concentrating or making decisions? 0  Walking or climbing stairs? 0  Dressing or bathing? 0  Doing errands, shopping? 0  Preparing Food and eating ? N  Using the Toilet? N  In the past six months, have you accidently leaked urine? Y  Comment at times  Do you have problems with loss of bowel control? N  Managing your Medications? N  Managing your Finances? N  Housekeeping or managing your Housekeeping? N    Patient Care Team: Jesus Bernardino MATSU, MD as PCP - General (Internal Medicine) Maranda Leim DEL, MD as PCP - Cardiology (Cardiology) Maree Lonni Inks, MD as Consulting Physician (Ophthalmology)  I have updated your Care Teams any recent Medical Services you may have received from other providers in the past year.     Assessment:   This is a routine wellness examination for Velvet.  Hearing/Vision screen Hearing Screening - Comments:: Pt denies any hearing issues  Vision Screening - Comments:: Wears rx glasses - up to date with routine eye exams with [facility] Dr Rayne    Goals Addressed  This Visit's Progress    Patient Stated       Lowering A1C        Depression Screen     02/23/2024   11:48 AM 06/18/2023   11:45 AM 03/01/2023    8:20 AM 02/15/2023   11:05 AM 01/22/2023    1:49 PM  PHQ 2/9 Scores  PHQ - 2 Score 0 2 0 5 5  PHQ- 9 Score  8  16 18     Fall Risk     02/23/2024   11:30 AM 06/18/2023   11:45 AM 02/15/2023   11:10 AM 01/22/2023    1:48 PM  Fall Risk   Falls in the past year? 1 1 1 1   Number falls in past yr: 1 0 1 0  Injury with Fall? 0 0 1 1  Comment   cut head open   Risk for fall due to : History of fall(s) No Fall Risks Impaired balance/gait;Impaired vision No Fall Risks  Follow up Falls prevention discussed Falls evaluation completed Falls prevention discussed Falls evaluation completed    MEDICARE RISK AT HOME:  Medicare  Risk at Home Any stairs in or around the home?: Yes If so, are there any without handrails?: No Home free of loose throw rugs in walkways, pet beds, electrical cords, etc?: Yes Adequate lighting in your home to reduce risk of falls?: Yes Life alert?: No Use of a cane, walker or w/c?: No Grab bars in the bathroom?: No Shower chair or bench in shower?: No Elevated toilet seat or a handicapped toilet?: No  TIMED UP AND GO:  Was the test performed?  No  Cognitive Function: 6CIT completed        02/23/2024   11:31 AM 02/15/2023   11:11 AM  6CIT Screen  What Year? 0 points 0 points  What month? 0 points 0 points  What time? 0 points 0 points  Count back from 20 0 points 0 points  Months in reverse 0 points 0 points  Repeat phrase 0 points 0 points  Total Score 0 points 0 points    Immunizations Immunization History  Administered Date(s) Administered   Hepatitis B, PED/ADOLESCENT 01/14/2015, 02/15/2015, 08/02/2015   Influenza, Quadrivalent, Recombinant, Inj, Pf 05/30/2018, 06/13/2019, 05/16/2021, 05/01/2022   Influenza,inj,Quad PF,6+ Mos 05/05/2022   Influenza-Unspecified 05/01/2012   Pneumococcal Polysaccharide-23 02/18/2018   Tdap 08/11/2013    Screening Tests Health Maintenance  Topic Date Due   FOOT EXAM  Never done   Pneumococcal Vaccine 33-69 Years old (2 of 2 - PCV) 02/19/2019   Colonoscopy  Never done   DTaP/Tdap/Td (2 - Td or Tdap) 08/12/2023   HEMOGLOBIN A1C  12/17/2023   Diabetic kidney evaluation - Urine ACR  01/15/2024   Cervical Cancer Screening (HPV/Pap Cotest)  06/17/2024 (Originally 03/29/2005)   INFLUENZA VACCINE  03/31/2024   Diabetic kidney evaluation - eGFR measurement  06/17/2024   OPHTHALMOLOGY EXAM  10/18/2024   Medicare Annual Wellness (AWV)  02/22/2025   Hepatitis B Vaccines  Completed   Hepatitis C Screening  Completed   HIV Screening  Completed   HPV VACCINES  Aged Out   Meningococcal B Vaccine  Aged Out   COVID-19 Vaccine  Discontinued     Health Maintenance  Health Maintenance Due  Topic Date Due   FOOT EXAM  Never done   Pneumococcal Vaccine 80-67 Years old (2 of 2 - PCV) 02/19/2019   Colonoscopy  Never done   DTaP/Tdap/Td (2 - Td or Tdap) 08/12/2023  HEMOGLOBIN A1C  12/17/2023   Diabetic kidney evaluation - Urine ACR  01/15/2024   Health Maintenance Items Addressed: See Nurse Notes at the end of this note  Additional Screening:  Vision Screening: Recommended annual ophthalmology exams for early detection of glaucoma and other disorders of the eye. Would you like a referral to an eye doctor? No    Dental Screening: Recommended annual dental exams for proper oral hygiene  Community Resource Referral / Chronic Care Management: CRR required this visit?  No   CCM required this visit?  No   Plan:    I have personally reviewed and noted the following in the patient's chart:   Medical and social history Use of alcohol , tobacco or illicit drugs  Current medications and supplements including opioid prescriptions. Patient is not currently taking opioid prescriptions. Functional ability and status Nutritional status Physical activity Advanced directives List of other physicians Hospitalizations, surgeries, and ER visits in previous 12 months Vitals Screenings to include cognitive, depression, and falls Referrals and appointments  In addition, I have reviewed and discussed with patient certain preventive protocols, quality metrics, and best practice recommendations. A written personalized care plan for preventive services as well as general preventive health recommendations were provided to patient.   Ellouise VEAR Haws, LPN   [id number]   After Visit Summary: (MyChart) Due to this being a telephonic visit, the after visit summary with patients personalized plan was offered to patient via MyChart   Notes: Nothing significant to report at this time.

## 2024-03-04 DIAGNOSIS — E1065 Type 1 diabetes mellitus with hyperglycemia: Secondary | ICD-10-CM | POA: Diagnosis not present

## 2024-03-06 DIAGNOSIS — E109 Type 1 diabetes mellitus without complications: Secondary | ICD-10-CM | POA: Diagnosis not present

## 2024-03-06 DIAGNOSIS — E039 Hypothyroidism, unspecified: Secondary | ICD-10-CM | POA: Diagnosis not present

## 2024-03-06 DIAGNOSIS — E7800 Pure hypercholesterolemia, unspecified: Secondary | ICD-10-CM | POA: Diagnosis not present

## 2024-03-13 DIAGNOSIS — M2550 Pain in unspecified joint: Secondary | ICD-10-CM | POA: Diagnosis not present

## 2024-03-13 DIAGNOSIS — E7800 Pure hypercholesterolemia, unspecified: Secondary | ICD-10-CM | POA: Diagnosis not present

## 2024-03-13 DIAGNOSIS — E109 Type 1 diabetes mellitus without complications: Secondary | ICD-10-CM | POA: Diagnosis not present

## 2024-03-13 DIAGNOSIS — G609 Hereditary and idiopathic neuropathy, unspecified: Secondary | ICD-10-CM | POA: Diagnosis not present

## 2024-03-13 DIAGNOSIS — M8589 Other specified disorders of bone density and structure, multiple sites: Secondary | ICD-10-CM | POA: Diagnosis not present

## 2024-03-13 DIAGNOSIS — Z4681 Encounter for fitting and adjustment of insulin pump: Secondary | ICD-10-CM | POA: Diagnosis not present

## 2024-03-13 DIAGNOSIS — Z780 Asymptomatic menopausal state: Secondary | ICD-10-CM | POA: Diagnosis not present

## 2024-03-13 DIAGNOSIS — G720 Drug-induced myopathy: Secondary | ICD-10-CM | POA: Diagnosis not present

## 2024-03-13 DIAGNOSIS — R5382 Chronic fatigue, unspecified: Secondary | ICD-10-CM | POA: Diagnosis not present

## 2024-03-13 DIAGNOSIS — I10 Essential (primary) hypertension: Secondary | ICD-10-CM | POA: Diagnosis not present

## 2024-03-13 DIAGNOSIS — F329 Major depressive disorder, single episode, unspecified: Secondary | ICD-10-CM | POA: Diagnosis not present

## 2024-03-13 DIAGNOSIS — E039 Hypothyroidism, unspecified: Secondary | ICD-10-CM | POA: Diagnosis not present

## 2024-03-21 ENCOUNTER — Encounter: Payer: Self-pay | Admitting: Internal Medicine

## 2024-03-21 ENCOUNTER — Ambulatory Visit: Admitting: Internal Medicine

## 2024-03-21 VITALS — BP 120/78 | HR 90 | Temp 98.0°F | Ht 61.0 in | Wt 141.6 lb

## 2024-03-21 DIAGNOSIS — E559 Vitamin D deficiency, unspecified: Secondary | ICD-10-CM | POA: Diagnosis not present

## 2024-03-21 DIAGNOSIS — E7800 Pure hypercholesterolemia, unspecified: Secondary | ICD-10-CM

## 2024-03-21 DIAGNOSIS — K3184 Gastroparesis: Secondary | ICD-10-CM | POA: Diagnosis not present

## 2024-03-21 DIAGNOSIS — F1220 Cannabis dependence, uncomplicated: Secondary | ICD-10-CM

## 2024-03-21 DIAGNOSIS — F319 Bipolar disorder, unspecified: Secondary | ICD-10-CM

## 2024-03-21 DIAGNOSIS — E1143 Type 2 diabetes mellitus with diabetic autonomic (poly)neuropathy: Secondary | ICD-10-CM

## 2024-03-21 DIAGNOSIS — E890 Postprocedural hypothyroidism: Secondary | ICD-10-CM | POA: Diagnosis not present

## 2024-03-21 DIAGNOSIS — F32A Depression, unspecified: Secondary | ICD-10-CM | POA: Diagnosis not present

## 2024-03-21 DIAGNOSIS — F17200 Nicotine dependence, unspecified, uncomplicated: Secondary | ICD-10-CM

## 2024-03-21 DIAGNOSIS — N182 Chronic kidney disease, stage 2 (mild): Secondary | ICD-10-CM

## 2024-03-21 DIAGNOSIS — E782 Mixed hyperlipidemia: Secondary | ICD-10-CM

## 2024-03-21 DIAGNOSIS — I201 Angina pectoris with documented spasm: Secondary | ICD-10-CM

## 2024-03-21 DIAGNOSIS — E1065 Type 1 diabetes mellitus with hyperglycemia: Secondary | ICD-10-CM

## 2024-03-21 DIAGNOSIS — I10 Essential (primary) hypertension: Secondary | ICD-10-CM | POA: Diagnosis not present

## 2024-03-21 DIAGNOSIS — M8580 Other specified disorders of bone density and structure, unspecified site: Secondary | ICD-10-CM

## 2024-03-21 MED ORDER — LOSARTAN POTASSIUM 25 MG PO TABS: 25.0000 mg | ORAL_TABLET | Freq: Every day | ORAL | 4 refills | Status: AC

## 2024-03-21 MED ORDER — OLANZAPINE 20 MG PO TABS
20.0000 mg | ORAL_TABLET | Freq: Every day | ORAL | 0 refills | Status: AC
Start: 2024-03-21 — End: ?

## 2024-03-21 NOTE — Patient Instructions (Addendum)
 It was a pleasure seeing you today! Your health and satisfaction are our top priorities.  Bernardino Cone, MD  Your Providers PCP: Cone Bernardino MATSU, MD,  [phone]) Referring Provider: Cone Bernardino MATSU, MD,  [phone]) Care Team Provider: Maranda Leim DEL, MD,  [phone]) Care Team Provider: Maree Lonni Inks, MD,  [phone])  VISIT SUMMARY:  Today, we reviewed your overall health, focusing on your diabetes, thyroid  issues, vitamin D deficiency, and other ongoing conditions. Your diabetes is well-controlled with the insulin  pump, and your thyroid  medication has been adjusted. We also discussed your recent diagnosis of vitamin D deficiency and osteopenia, and your current treatment plan for these conditions. Additionally, we reviewed your management plan for gastroparesis, coronary artery spasms, hypertension, hyperlipidemia, depressive disorder, and tobacco use.  YOUR PLAN:  -TYPE 1 DIABETES MELLITUS: Type 1 diabetes is a condition where your body does not produce insulin . Your diabetes is well-controlled with the insulin  pump, and your A1c has improved to 8.0 with no recent hypoglycemic episodes. Continue using the insulin  pump and monitor your blood glucose levels regularly.  -POSTABLATIVE HYPOTHYROIDISM: Postablative hypothyroidism occurs when your thyroid  is underactive after treatment. Your levothyroxine  dose has been adjusted to 120 mcg daily due to elevated thyroid  levels. Continue taking levothyroxine  120 mcg daily and monitor your thyroid  function tests regularly.  -GASTROPARESIS: Gastroparesis is a condition that affects the stomach muscles and prevents proper stomach emptying. You are managing occasional nausea with cannabis, which is effective and affordable for you. Continue using cannabis as needed for symptom management.  -CORONARY ARTERY SPASM: Coronary artery spasms are sudden constrictions of the muscles within the arteries of your heart. You have not had  any recent episodes and are managing this condition with nitroglycerin  as needed. Continue using nitroglycerin  as needed and follow up with your cardiologist.  -HYPERTENSION: Hypertension is high blood pressure. Your blood pressure is well-controlled with losartan  25 mg daily, which also helps protect your kidneys. Continue taking losartan  25 mg daily and monitor your blood pressure regularly.  -MIXED HYPERLIPIDEMIA: Mixed hyperlipidemia is a condition with high levels of different types of fats in your blood. You will start Leqvio (inclisiran) with financial assistance, as you had joint pain with Repatha . Begin Leqvio therapy and monitor your lipid levels regularly.  -VITAMIN D DEFICIENCY AND OSTEOPENIA: Vitamin D deficiency can lead to osteopenia, a condition where bone density is lower than normal. You have started vitamin D supplementation and noticed improvement in bone pain and muscle weakness. Continue taking vitamin D 2000 IU daily with calcium  and monitor your bone density and symptoms.  -DEPRESSIVE DISORDER: Depressive disorder is a mood disorder causing persistent feelings of sadness. You are taking olanzapine  (Zyprexa ) 20 mg at bedtime. We discussed the need for psychiatric follow-up due to the high dose and potential side effects. Continue taking olanzapine  20 mg at bedtime and we will refer you to psychiatry for medication management.  -TOBACCO USE DISORDER: Tobacco use disorder involves dependence on nicotine . We discussed transitioning to less harmful alternatives like Zin. Consider trying Zin as a harm reduction strategy.  INSTRUCTIONS:  Please continue with your current medications and follow the management plans discussed today. Schedule a follow-up appointment with psychiatry for medication management of your depressive disorder. Monitor your blood glucose, thyroid  function, blood pressure, and lipid levels regularly. Continue vitamin D supplementation and monitor your bone density.  Follow up with your cardiologist as needed. Consider transitioning to Zin for tobacco use.   NEXT STEPS: [x]  Early Intervention: Schedule sooner appointment,  call our on-call services, or go to emergency room if there is any significant Increase in pain or discomfort New or worsening symptoms Sudden or severe changes in your health [x]  Flexible Follow-Up: We recommend a No follow-ups on file. for optimal routine care. This allows for progress monitoring and treatment adjustments. [x]  Preventive Care: Schedule your annual preventive care visit! It's typically covered by insurance and helps identify potential health issues early. [x]  Lab & X-ray Appointments: Incomplete tests scheduled today, or call to schedule. X-rays: [facility] (M-F, 8:30am-noon or 1pm-5pm). [x]  Medical Information Release: Sign a release form at front desk to obtain relevant medical information we don't have.  MAKING THE MOST OF OUR FOCUSED 20 MINUTE APPOINTMENTS: [x]   Clearly state your top concerns at the beginning of the visit to focus our discussion [x]   If you anticipate you will need more time, please inform the front desk during scheduling - we can book multiple appointments in the same week. [x]   If you have transportation problems- use our convenient video appointments or ask about transportation support. [x]   We can get down to business faster if you use MyChart to update information before the visit and submit non-urgent questions before your visit. Thank you for taking the time to provide details through MyChart.  Let our nurse know and she can import this information into your encounter documents.  Arrival and Wait Times: [x]   Arriving on time ensures that everyone receives prompt attention. [x]   Early morning (8a) and afternoon (1p) appointments tend to have shortest wait times. [x]   Unfortunately, we cannot delay appointments for late arrivals or hold slots during phone calls.  Getting  Answers and Following Up [x]   Simple Questions & Concerns: For quick questions or basic follow-up after your visit, reach us  at [phone] or MyChart messaging. [x]   Complex Concerns: If your concern is more complex, scheduling an appointment might be best. Discuss this with the staff to find the most suitable option. [x]   Lab & Imaging Results: We'll contact you directly if results are abnormal or you don't use MyChart. Most normal results will be on MyChart within 2-3 business days, with a review message from Dr. Jesus. Haven't heard back in 2 weeks? Need results sooner? Contact us  at [phone]. [x]   Referrals: Our referral coordinator will manage specialist referrals. The specialist's office should contact you within 2 weeks to schedule an appointment. Call us  if you haven't heard from them after 2 weeks.  Staying Connected [x]   MyChart: Activate your MyChart for the fastest way to access results and message us . See the last page of this paperwork for instructions on how to activate.  Bring to Your Next Appointment [x]   Medications: Please bring all your medication bottles to your next appointment to ensure we have an accurate record of your prescriptions. [x]   Health Diaries: If you're monitoring any health conditions at home, keeping a diary of your readings can be very helpful for discussions at your next appointment.  Billing [x]   X-ray & Lab Orders: These are billed by separate companies. Contact the invoicing company directly for questions or concerns. [x]   Visit Charges: Discuss any billing inquiries with our administrative services team.  Your Satisfaction Matters [x]   Share Your Experience: We strive for your satisfaction! If you have any complaints, or preferably compliments, please let Dr. Jesus know directly or contact our Practice Administrators, Manuelita Rubin or [employer], by asking at the front desk.   Reviewing Your  Records [x]   Review this early draft of  your clinical encounter notes below and the final encounter summary tomorrow on MyChart after its been completed.  All orders placed so far are visible here: Vitamin D deficiency  Osteopenia, unspecified location  Chronic kidney disease, stage 2 (mild)  Mixed hyperlipidemia  Depressive disorder -     Ambulatory referral to Psychiatry  Bipolar 1 disorder (HCC) -     OLANZapine ; Take 1 tablet (20 mg total) by mouth at bedtime.  Dispense: 90 tablet; Refill: 0  Primary hypertension -     Losartan  Potassium; Take 1 tablet (25 mg total) by mouth daily.  Dispense: 90 tablet; Refill: 4  Postablative hypothyroidism  Marijuana dependence (HCC)  Pure hypercholesterolemia  Tobacco use disorder

## 2024-03-21 NOTE — Assessment & Plan Note (Signed)
 Occasional nausea is reported, previously managed with Marinol , but discontinued due to cost. Cannabis is used effectively and affordably for symptom management.

## 2024-03-21 NOTE — Assessment & Plan Note (Signed)
 Depressive disorder is managed with olanzapine  (Zyprexa ) 20 mg at bedtime. There is a discussion about psychiatric follow-up due to the high dose and potential side effects on diabetes and weight. She acknowledges the need for psychiatric evaluation and is open to referral. Continue olanzapine  20 mg at bedtime and refer to psychiatry for medication management.

## 2024-03-21 NOTE — Assessment & Plan Note (Signed)
 Mixed hyperlipidemia was previously treated with Repatha , discontinued due to joint pain. Leqvio (inclisiran) will be initiated with financial assistance secured. Previous intolerance to other lipid-lowering therapies makes Leqvio a promising alternative. Initiate Leqvio therapy and monitor lipid levels regularly.

## 2024-03-21 NOTE — Assessment & Plan Note (Signed)
 She uses medically for gastroparesis

## 2024-03-21 NOTE — Assessment & Plan Note (Signed)
 Postablative hypothyroidism is managed with levothyroxine , recently adjusted from 137 mcg to 120 mcg due to elevated thyroid  levels. Weight gain is attributed to previous low thyroid  function and improved glycemic control. Continue levothyroxine  120 mcg daily and monitor thyroid  function tests regularly.

## 2024-03-21 NOTE — Assessment & Plan Note (Signed)
 Vitamin D deficiency contributes to osteopenia and musculoskeletal symptoms. Improvement is noted with vitamin D supplementation, with decreased bone pain and muscle weakness. Continue vitamin D supplementation 2000 IU daily with calcium  and monitor bone density and symptoms.

## 2024-03-21 NOTE — Assessment & Plan Note (Signed)
 Hypertension is well-controlled with losartan  25 mg daily, which also provides renal protection. Continue losartan  25 mg daily and monitor blood pressure regularly.

## 2024-03-21 NOTE — Progress Notes (Signed)
 ==============================  [facility]: [id number]   -- Medical Office Visit --  Patient: Karen Hayden      Age: 48 y.o.       Sex:  female  Date:   03/21/2024 Today's Healthcare Provider: Bernardino KANDICE Cone, MD  ==============================   Chief Complaint: Diabetes (Would like to talk about diabetes )  Discussed the use of AI scribe software for clinical note transcription with the patient, who gave verbal consent to proceed.  History of Present Illness  48 year old female with diabetes and thyroid  issues who presents for a routine follow-up.  She manages her diabetes with an insulin  pump, resulting in improved hemoglobin A1c levels to 8 and no recent hypoglycemic episodes. She has experienced weight gain, which she attributes to better blood sugar control and previously low thyroid  function. Her thyroid  medication was adjusted from 137 mcg to 120 mcg due to elevated levels.  She was recently diagnosed with vitamin D deficiency and osteopenia, which have caused bone pain and muscle weakness. She has started vitamin D supplementation and notes some improvement in her symptoms.  Her past medical history includes brittle diabetes, now well-controlled with the insulin  pump, and hyperthyroidism treated with radioactive iodine therapy. She also has a history of coronary artery spasms and uses nitroglycerin  as needed. She reports she has not had any recent episodes. She follows up with a cardiologist.  She has a history of bipolar disorder, reports no manic episodes in over a decade, and takes Zyprexa  20 mg for depression. She also has hyperlipidemia, previously treated with Repatha , which caused joint pain. She is awaiting to start Lecovio with financial assistance.  She has a history of gastroparesis, previously requiring Marinol  for appetite stimulation, and currently uses cannabis for appetite and nausea management. She also has a history of a  retinal tear, cataracts, and glaucoma, which are stable.  She reports a history of seizures related to blood sugar fluctuations, but none recently. She avoids Wellbutrin due to this history. She vapes for nicotine  use, noting that vaping helps with her appetite and nausea.  She takes a multivitamin, vitamin B, vitamin C, and biotin supplements, reporting improved hair health with these supplements.  Background Reviewed: Problem List: has Type 1 diabetes mellitus (HCC); SEIZURE DISORDER; Diabetic gastroparesis (HCC); Tobacco use disorder; Marijuana dependence (HCC); Coronary artery spasm (HCC); Hypothyroidism; Depressive disorder; Hereditary and idiopathic neuropathy, unspecified; Hypertension; Joint pain; Chronic kidney disease, stage 2 (mild); Statin intolerance; Hyperlipidemia; Vitamin D deficiency; and Osteopenia on their problem list. Past Medical History:  has a past medical history of Acne (06/18/2022), Acute bronchitis (01/04/2009), Acute posttraumatic stress disorder (07/06/2018), Anxiety, ARF (acute renal failure) (HCC) (05/03/2019), Bipolar 1 disorder (HCC), Brittle diabetes mellitus (HCC) (06/18/2022), Change in bowel habits (06/21/2013), Chronic kidney infection, Constipation (01/26/2014), Coronary artery spasm (HCC), Depressive disorder (06/11/2022), Diabetes mellitus type I (HCC), Diabetic gastroparesis (HCC), Diarrhea (01/22/2023), DM (diabetes mellitus) (HCC), Gastroparesis, Hyperlipidemia, Hypertension, Hypothyroidism, Insulin  long-term use (HCC) (01/16/2023), Long term current use of oral hypoglycemic drug (01/16/2023), Material hardship due to limited financial resources (03/01/2023), Nausea & vomiting (05/03/2019), Nausea with vomiting (06/21/2013), Palpitations (07/06/2018), Protein-calorie malnutrition, severe (HCC) (01/29/2014), Retinal tear (01/15/2023), Seizure disorder (HCC), Umbilical hernia, and Unintentional weight loss (06/11/2022). Past Surgical History:   has a past surgical  history that includes Vaginal hysterectomy; Tonsillectomy; LEFT HEART CATH AND CORONARY ANGIOGRAPHY (N/A, 02/18/2018); Adenoidectomy; and Cardiac catheterization. Social History:   reports that she quit smoking about 6 years ago. Her  smoking use included cigarettes. She started smoking about 19 years ago. She has a 13 pack-year smoking history. She has never used smokeless tobacco. She reports that she does not currently use drugs after having used the following drugs: Marijuana. She reports that she does not drink alcohol . Family History:  family history includes Anxiety disorder in her maternal grandmother, mother, and paternal aunt; Depression in her maternal grandmother, mother, and paternal aunt; Diabetes in her maternal grandmother, mother, paternal aunt, paternal aunt, and paternal grandmother; Early death in her mother; Goiter in her maternal grandmother; Hearing loss in her father; Heart attack in her mother; Heart disease in her mother; Hyperlipidemia in her father and mother; Hypertension in her father and mother; Mood Disorder in her father; Ovarian cancer in her paternal grandmother; Pulmonary embolism in her daughter; Thyroid  disease in her father. Allergies:  is allergic to seroquel [quetiapine fumarate], isosorbide  nitrate, lipitor [atorvastatin], codeine, and isosorbide .   Medication Reconciliation: Current Outpatient Medications on File Prior to Visit  Medication Sig   ADACEL 12-30-13.5 LF-MCG/0.5 injection    FLUBLOK 0.5 ML SOSY    gabapentin  (NEURONTIN ) 300 MG capsule Take 300 mg by mouth daily.   Glucagon  (GVOKE HYPOPEN  2-PACK) 0.5 MG/0.1ML SOAJ Inject 0.5 mg into the skin daily as needed (for low blood sugar).   insulin  glargine (LANTUS  SOLOSTAR) 100 UNIT/ML Solostar Pen Inject 25 Units into the skin in the morning. Replaces insulin  NPH.   Insulin  Infusion Pump (MINIMED 780G INSULIN  PUMP) KIT See admin instructions.   insulin  NPH Human (NOVOLIN N) 100 UNIT/ML injection Inject 16  Units into the skin 2 (two) times daily before a meal.    insulin  regular (NOVOLIN R) 100 units/mL injection Inject 0.1 mLs (10 Units total) into the skin 3 (three) times daily before meals. 1 unit per 8 grams of carbs.at meals and to correct high blood sugar.   Insulin  Syringe-Needle U-100 (INSULIN  SYRINGE .5CC/28G) 28G X 1/2 0.5 ML MISC Use as instructed to administer insulin    levothyroxine  (SYNTHROID ) 125 MCG tablet Take 1 tablet (125 mcg total) by mouth daily. Replaces 100 mcg daily dosing.  Recheck thyroid  levels 6-8 weeks after dose change   NOVOLOG  100 UNIT/ML injection SMARTSIG:100 Unit(s) SUB-Q Daily   ondansetron  (ZOFRAN -ODT) 4 MG disintegrating tablet Take 1 tablet (4 mg total) by mouth every 8 (eight) hours as needed for nausea or vomiting (for nausea from wegovy or other source).   trimethoprim  (TRIMPEX ) 100 MG tablet Take 100 mg by mouth daily.   TWINRIX 720-20 ELU-MCG/ML injection    Continuous Glucose Sensor (DEXCOM G7 SENSOR) MISC Apply sensor every 10 days. Pharmacy please provide 3 boxes (1 sensor per week) (Patient not taking: Reported on 03/21/2024)   dapagliflozin  propanediol (FARXIGA ) 10 MG TABS tablet Take 1 tablet (10 mg total) by mouth daily before breakfast. (Patient not taking: Reported on 02/23/2024)   empagliflozin  (JARDIANCE ) 10 MG TABS tablet Take 1 tablet (10 mg total) by mouth daily before breakfast. (Patient not taking: Reported on 02/23/2024)   Evolocumab  (REPATHA  SURECLICK) 140 MG/ML SOAJ Inject 140 mg into the skin every 14 (fourteen) days. (Patient not taking: Reported on 02/23/2024)   Insulin  Disposable Pump (OMNIPOD 5 G6 INTRO, GEN 5,) KIT 0.8 Units/hr by Does not apply route continuous. Using OmniPod along with Dexcom G6 to manage diabetes type 1 brittle please see Dr. For assistance with setting up but initial settings are planned to be 0.8 units/h continuous and 10-1 bolus ratio (Patient not taking: Reported on 02/23/2024)  Insulin  Disposable Pump (OMNIPOD 5 G7  INTRO, GEN 5,) KIT 1 each by Does not apply route continuous. brittle type 1 diabetes mellitus retinopathy and history of level 3 hypoglycemia (glucose <54 mg/dL and altered mental  that resulted in hospital admission and intubation.   HGBA1C  11.3 01/15/2023 (Patient not taking: Reported on 02/23/2024)   Insulin  Disposable Pump (OMNIPOD 5 G7 PODS, GEN 5,) MISC 5 each by Does not apply route daily at 6 (six) AM. (Patient not taking: Reported on 02/23/2024)   levothyroxine  (SYNTHROID ) 137 MCG tablet Take 137 mcg by mouth every morning. (Patient not taking: Reported on 03/21/2024)   Multiple Vitamins-Minerals (MULTI FOR HER) TABS as directed Orally   Probiotic CHEW as directed Orally   No current facility-administered medications on file prior to visit.   Medications Discontinued During This Encounter  Medication Reason   OLANZapine  (ZYPREXA ) 10 MG tablet Reorder   losartan  (COZAAR ) 25 MG tablet Reorder     Physical Exam:    03/21/2024   10:12 AM 02/23/2024   11:20 AM 06/18/2023   10:55 AM  Vitals with BMI  Height 5' 1 5' 1 5' 2  Weight 141 lbs 10 oz 143 lbs 111 lbs  BMI 26.77 27.03 20.3  Systolic 120  102  Diastolic 78  80  Pulse 90  78  Vital signs reviewed.  Nursing notes reviewed. Weight trend reviewed. Physical Exam General Appearance:  No acute distress appreciable.   Well-groomed, healthy-appearing female.  Well proportioned with no abnormal fat distribution.  Good muscle tone. Pulmonary:  Normal work of breathing at rest, no respiratory distress apparent. SpO2: 98 %  Musculoskeletal: All extremities are intact.  Neurological:  Awake, alert, oriented, and engaged.  No obvious focal neurological deficits or cognitive impairments.  Sensorium seems unclouded.   Speech is clear and coherent with logical content. Psychiatric:  Appropriate mood, pleasant and cooperative demeanor, thoughtful and engaged during the exam  Results:    03/21/2024   10:18 AM 02/23/2024   11:48 AM  06/18/2023   11:45 AM 03/01/2023    8:20 AM  PHQ 2/9 Scores  PHQ - 2 Score 0 0 2 0  PHQ- 9 Score 0  8    Results LABS A1c: 8% GFR: 71 mL/min/1.73 m Urine microalbumin: Negative  RADIOLOGY Bone density: Osteopenia  Scanned Document on 10/20/2023  Component Date Value Ref Range Status   HM Diabetic Eye Exam 10/19/2023 No Retinopathy  No Retinopathy Final  Office Visit on 06/18/2023  Component Date Value Ref Range Status   Hgb A1c MFr Bld 06/18/2023 9.3 (H)  4.6 - 6.5 % Final   Color, UA 06/18/2023 pale yellow   Final   Clarity, UA 06/18/2023 clear   Final   Glucose, UA 06/18/2023 Positive (A)  Negative Final   Bilirubin, UA 06/18/2023 Negative   Final   Ketones, UA 06/18/2023 Negative   Final   Spec Grav, UA 06/18/2023 1.010  1.010 - 1.025 Final   Blood, UA 06/18/2023 Negative   Final   pH, UA 06/18/2023 6.0  5.0 - 8.0 Final   Protein, UA 06/18/2023 Negative  Negative Final   Urobilinogen, UA 06/18/2023 0.2  0.2 or 1.0 E.U./dL Final   Nitrite, UA 89/81/7975 Positive   Final   Leukocytes, UA 06/18/2023 Negative  Negative Final   Sodium 06/18/2023 139  135 - 145 mEq/L Final   Potassium 06/18/2023 4.7  3.5 - 5.1 mEq/L Final   Chloride 06/18/2023 99  96 - 112 mEq/L Final  CO2 06/18/2023 34 (H)  19 - 32 mEq/L Final   Glucose, Bld 06/18/2023 93  70 - 99 mg/dL Final   BUN 89/81/7975 12  6 - 23 mg/dL Final   Creatinine, Ser 06/18/2023 0.84  0.40 - 1.20 mg/dL Final   Total Bilirubin 06/18/2023 0.4  0.2 - 1.2 mg/dL Final   Alkaline Phosphatase 06/18/2023 81  39 - 117 U/L Final   AST 06/18/2023 25  0 - 37 U/L Final   ALT 06/18/2023 19  0 - 35 U/L Final   Total Protein 06/18/2023 7.4  6.0 - 8.3 g/dL Final   Albumin 89/81/7975 4.5  3.5 - 5.2 g/dL Final   GFR 89/81/7975 82.29  >60.00 mL/min Final   Calcium  06/18/2023 10.9 (H)  8.4 - 10.5 mg/dL Final   WBC 89/81/7975 6.0  4.0 - 10.5 K/uL Final   RBC 06/18/2023 5.32 (H)  3.87 - 5.11 Mil/uL Final   Hemoglobin 06/18/2023 14.6  12.0 -  15.0 g/dL Final   HCT 89/81/7975 45.2  36.0 - 46.0 % Final   MCV 06/18/2023 85.0  78.0 - 100.0 fl Final   MCHC 06/18/2023 32.3  30.0 - 36.0 g/dL Final   RDW 89/81/7975 13.9  11.5 - 15.5 % Final   Platelets 06/18/2023 378.0  150.0 - 400.0 K/uL Final   Neutrophils Relative % 06/18/2023 72.5  43.0 - 77.0 % Final   Lymphocytes Relative 06/18/2023 13.1  12.0 - 46.0 % Final   Monocytes Relative 06/18/2023 8.1  3.0 - 12.0 % Final   Eosinophils Relative 06/18/2023 5.5 (H)  0.0 - 5.0 % Final   Basophils Relative 06/18/2023 0.8  0.0 - 3.0 % Final   Neutro Abs 06/18/2023 4.4  1.4 - 7.7 K/uL Final   Lymphs Abs 06/18/2023 0.8  0.7 - 4.0 K/uL Final   Monocytes Absolute 06/18/2023 0.5  0.1 - 1.0 K/uL Final   Eosinophils Absolute 06/18/2023 0.3  0.0 - 0.7 K/uL Final   Basophils Absolute 06/18/2023 0.0  0.0 - 0.1 K/uL Final   Cholesterol 06/18/2023 179  <200 mg/dL Final   HDL 89/81/7975 58  > OR = 50 mg/dL Final   Triglycerides 89/81/7975 120  <150 mg/dL Final   LDL Cholesterol (Calc) 06/18/2023 99  mg/dL (calc) Final   Total CHOL/HDL Ratio 06/18/2023 3.1  <4.9 (calc) Final   Non-HDL Cholesterol (Calc) 06/18/2023 121  <130 mg/dL (calc) Final   Color, Urine 06/18/2023 YELLOW  Yellow;Lt. Yellow;Straw;Dark Yellow;Amber;Green;Red;Brown Final   APPearance 06/18/2023 CLEAR  Clear;Turbid;Slightly Cloudy;Cloudy Final   Specific Gravity, Urine 06/18/2023 <=1.005 (A)  1.000 - 1.030 Final   pH 06/18/2023 6.5  5.0 - 8.0 Final   Total Protein, Urine 06/18/2023 NEGATIVE  Negative Final   Urine Glucose 06/18/2023 >=1000 (A)  Negative Final   Ketones, ur 06/18/2023 NEGATIVE  Negative Final   Bilirubin Urine 06/18/2023 NEGATIVE  Negative Final   Hgb urine dipstick 06/18/2023 NEGATIVE  Negative Final   Urobilinogen, UA 06/18/2023 0.2  0.0 - 1.0 Final   Leukocytes,Ua 06/18/2023 NEGATIVE  Negative Final   Nitrite 06/18/2023 NEGATIVE  Negative Final   WBC, UA 06/18/2023 0-2/hpf  0-2/hpf Final   RBC / HPF 06/18/2023  none seen  0-2/hpf Final   Squamous Epithelial / HPF 06/18/2023 Rare(0-4/hpf)  Rare(0-4/hpf) Final   Bacteria, UA 06/18/2023 Many(>50/hpf) (A)  None Final   TSH W/REFLEX TO FT4 06/18/2023 1.74  mIU/L Final  Office Visit on 02/08/2023  Component Date Value Ref Range Status   Color, Urine 02/08/2023 YELLOW  Yellow;Lt. Yellow;Straw;Dark Yellow;Amber;Green;Red;Brown Final   APPearance 02/08/2023 CLEAR  Clear;Turbid;Slightly Cloudy;Cloudy Final   Specific Gravity, Urine 02/08/2023 1.010  1.000 - 1.030 Final   pH 02/08/2023 6.5  5.0 - 8.0 Final   Total Protein, Urine 02/08/2023 NEGATIVE  Negative Final   Urine Glucose 02/08/2023 >=1000 (A)  Negative Final   Ketones, ur 02/08/2023 NEGATIVE  Negative Final   Bilirubin Urine 02/08/2023 NEGATIVE  Negative Final   Hgb urine dipstick 02/08/2023 NEGATIVE  Negative Final   Urobilinogen, UA 02/08/2023 0.2  0.0 - 1.0 Final   Leukocytes,Ua 02/08/2023 NEGATIVE  Negative Final   Nitrite 02/08/2023 NEGATIVE  Negative Final   WBC, UA 02/08/2023 0-2/hpf  0-2/hpf Final   RBC / HPF 02/08/2023 none seen  0-2/hpf Final   Squamous Epithelial / HPF 02/08/2023 Rare(0-4/hpf)  Rare(0-4/hpf) Final  Office Visit on 01/22/2023  Component Date Value Ref Range Status   MICRO NUMBER: 01/22/2023 [id number]   Final   SPECIMEN QUALITY: 01/22/2023 Adequate   Final   Sample Source 01/22/2023 URINE   Final   STATUS: 01/22/2023 FINAL   Final   ISOLATE 1: 01/22/2023 Escherichia coli (A)   Final   ISOLATE 2: 01/22/2023 Streptococcus agalactiae (A)   Final  Office Visit on 01/15/2023  Component Date Value Ref Range Status   WBC 01/15/2023 4.8  4.0 - 10.5 K/uL Final   RBC 01/15/2023 5.06  3.87 - 5.11 Mil/uL Final   Hemoglobin 01/15/2023 14.4  12.0 - 15.0 g/dL Final   HCT 94/82/7975 43.0  36.0 - 46.0 % Final   MCV 01/15/2023 85.1  78.0 - 100.0 fl Final   MCHC 01/15/2023 33.6  30.0 - 36.0 g/dL Final   RDW 94/82/7975 13.9  11.5 - 15.5 % Final   Platelets 01/15/2023 327.0  150.0 -  400.0 K/uL Final   Neutrophils Relative % 01/15/2023 74.9  43.0 - 77.0 % Final   Lymphocytes Relative 01/15/2023 13.4  12.0 - 46.0 % Final   Monocytes Relative 01/15/2023 7.9  3.0 - 12.0 % Final   Eosinophils Relative 01/15/2023 2.7  0.0 - 5.0 % Final   Basophils Relative 01/15/2023 1.1  0.0 - 3.0 % Final   Neutro Abs 01/15/2023 3.6  1.4 - 7.7 K/uL Final   Lymphs Abs 01/15/2023 0.6 (L)  0.7 - 4.0 K/uL Final   Monocytes Absolute 01/15/2023 0.4  0.1 - 1.0 K/uL Final   Eosinophils Absolute 01/15/2023 0.1  0.0 - 0.7 K/uL Final   Basophils Absolute 01/15/2023 0.1  0.0 - 0.1 K/uL Final   Sodium 01/15/2023 140  135 - 145 mEq/L Final   Potassium 01/15/2023 4.6  3.5 - 5.1 mEq/L Final   Chloride 01/15/2023 102  96 - 112 mEq/L Final   CO2 01/15/2023 30  19 - 32 mEq/L Final   Glucose, Bld 01/15/2023 122 (H)  70 - 99 mg/dL Final   BUN 94/82/7975 13  6 - 23 mg/dL Final   Creatinine, Ser 01/15/2023 0.84  0.40 - 1.20 mg/dL Final   Total Bilirubin 01/15/2023 0.5  0.2 - 1.2 mg/dL Final   Alkaline Phosphatase 01/15/2023 76  39 - 117 U/L Final   AST 01/15/2023 16  0 - 37 U/L Final   ALT 01/15/2023 13  0 - 35 U/L Final   Total Protein 01/15/2023 6.9  6.0 - 8.3 g/dL Final   Albumin 94/82/7975 4.4  3.5 - 5.2 g/dL Final   GFR 94/82/7975 82.53  >60.00 mL/min Final   Calcium  01/15/2023 10.0  8.4 - 10.5  mg/dL Final   Cholesterol 94/82/7975 177  0 - 200 mg/dL Final   Triglycerides 94/82/7975 90.0  0.0 - 149.0 mg/dL Final   HDL 94/82/7975 58.30  >39.00 mg/dL Final   VLDL 94/82/7975 18.0  0.0 - 40.0 mg/dL Final   LDL Cholesterol 01/15/2023 101 (H)  0 - 99 mg/dL Final   Total CHOL/HDL Ratio 01/15/2023 3   Final   NonHDL 01/15/2023 118.60   Final   Hgb A1c MFr Bld 01/15/2023 11.3 (H)  4.6 - 6.5 % Final   TSH W/REFLEX TO FT4 01/15/2023 24.69 (H)  mIU/L Final   Free T4 01/15/2023 0.6 (L)  0.8 - 1.8 ng/dL Final  Admission on 88/84/7976, Discharged on 07/15/2022  Component Date Value Ref Range Status   Lipase  07/15/2022 55 (H)  11 - 51 U/L Final   Sodium 07/15/2022 140  135 - 145 mmol/L Final   Potassium 07/15/2022 3.9  3.5 - 5.1 mmol/L Final   Chloride 07/15/2022 101  98 - 111 mmol/L Final   CO2 07/15/2022 30  22 - 32 mmol/L Final   Glucose, Bld 07/15/2022 122 (H)  70 - 99 mg/dL Final   BUN 88/84/7976 18  6 - 20 mg/dL Final   Creatinine, Ser 07/15/2022 0.78  0.44 - 1.00 mg/dL Final   Calcium  07/15/2022 10.0  8.9 - 10.3 mg/dL Final   Total Protein 88/84/7976 7.0  6.5 - 8.1 g/dL Final   Albumin 88/84/7976 4.3  3.5 - 5.0 g/dL Final   AST 88/84/7976 20  15 - 41 U/L Final   ALT 07/15/2022 27  0 - 44 U/L Final   Alkaline Phosphatase 07/15/2022 70  38 - 126 U/L Final   Total Bilirubin 07/15/2022 0.4  0.3 - 1.2 mg/dL Final   GFR, Estimated 07/15/2022 >60  >60 mL/min Final   Anion gap 07/15/2022 9  5 - 15 Final   WBC 07/15/2022 7.6  4.0 - 10.5 K/uL Final   RBC 07/15/2022 5.05  3.87 - 5.11 MIL/uL Final   Hemoglobin 07/15/2022 13.8  12.0 - 15.0 g/dL Final   HCT 88/84/7976 41.1  36.0 - 46.0 % Final   MCV 07/15/2022 81.4  80.0 - 100.0 fL Final   MCH 07/15/2022 27.3  26.0 - 34.0 pg Final   MCHC 07/15/2022 33.6  30.0 - 36.0 g/dL Final   RDW 88/84/7976 13.2  11.5 - 15.5 % Final   Platelets 07/15/2022 350  150 - 400 K/uL Final   nRBC 07/15/2022 0.0  0.0 - 0.2 % Final   Color, Urine 07/15/2022 YELLOW  YELLOW Final   APPearance 07/15/2022 CLEAR  CLEAR Final   Specific Gravity, Urine 07/15/2022 1.014  1.005 - 1.030 Final   pH 07/15/2022 7.5  5.0 - 8.0 Final   Glucose, UA 07/15/2022 NEGATIVE  NEGATIVE mg/dL Final   Hgb urine dipstick 07/15/2022 NEGATIVE  NEGATIVE Final   Bilirubin Urine 07/15/2022 NEGATIVE  NEGATIVE Final   Ketones, ur 07/15/2022 NEGATIVE  NEGATIVE mg/dL Final   Protein, ur 88/84/7976 NEGATIVE  NEGATIVE mg/dL Final   Nitrite 88/84/7976 NEGATIVE  NEGATIVE Final   Leukocytes,Ua 07/15/2022 NEGATIVE  NEGATIVE Final   Glucose-Capillary 07/15/2022 108 (H)  70 - 99 mg/dL Final    Glucose-Capillary 07/15/2022 81  70 - 99 mg/dL Final  Office Visit on 07/15/2022  Component Date Value Ref Range Status   SARS Coronavirus 2 Ag 07/15/2022 Negative  Negative Final   Influenza A, POC 07/15/2022 Negative  Negative Final   Influenza B, POC 07/15/2022  Negative  Negative Final  Office Visit on 06/11/2022  Component Date Value Ref Range Status   WBC 06/11/2022 6.4  4.0 - 10.5 K/uL Final   RBC 06/11/2022 5.10  3.87 - 5.11 Mil/uL Final   Platelets 06/11/2022 290.0  150.0 - 400.0 K/uL Final   Hemoglobin 06/11/2022 14.0  12.0 - 15.0 g/dL Final   HCT 89/87/7976 42.4  36.0 - 46.0 % Final   MCV 06/11/2022 83.2  78.0 - 100.0 fl Final   MCHC 06/11/2022 33.0  30.0 - 36.0 g/dL Final   RDW 89/87/7976 13.3  11.5 - 15.5 % Final   Sodium 06/11/2022 136  135 - 145 mEq/L Final   Potassium 06/11/2022 3.6  3.5 - 5.1 mEq/L Final   Chloride 06/11/2022 99  96 - 112 mEq/L Final   CO2 06/11/2022 28  19 - 32 mEq/L Final   Glucose, Bld 06/11/2022 248 (H)  70 - 99 mg/dL Final   BUN 89/87/7976 10  6 - 23 mg/dL Final   Creatinine, Ser 06/11/2022 0.82  0.40 - 1.20 mg/dL Final   Total Bilirubin 06/11/2022 0.7  0.2 - 1.2 mg/dL Final   Alkaline Phosphatase 06/11/2022 64  39 - 117 U/L Final   AST 06/11/2022 27  0 - 37 U/L Final   ALT 06/11/2022 22  0 - 35 U/L Final   Total Protein 06/11/2022 7.3  6.0 - 8.3 g/dL Final   Albumin 89/87/7976 4.6  3.5 - 5.2 g/dL Final   GFR 89/87/7976 85.31  >60.00 mL/min Final   Calcium  06/11/2022 10.0  8.4 - 10.5 mg/dL Final   Hgb J8r MFr Bld 06/11/2022 11.3 (H)  4.6 - 6.5 % Final   TSH 06/11/2022 2.54  0.35 - 5.50 uIU/mL Final   Free T4 06/11/2022 1.08  0.60 - 1.60 ng/dL Final   Color, UA 89/87/7976 yellow   Final   Clarity, UA 06/11/2022 clear   Final   Glucose, UA 06/11/2022 Negative  Negative Final   Bilirubin, UA 06/11/2022 Negative   Final   Ketones, UA 06/11/2022 Negative   Final   Spec Grav, UA 06/11/2022 1.010  1.010 - 1.025 Final   Blood, UA 06/11/2022  Negative   Final   pH, UA 06/11/2022 6.0  5.0 - 8.0 Final   Protein, UA 06/11/2022 Negative  Negative Final   Urobilinogen, UA 06/11/2022 0.2  0.2 or 1.0 E.U./dL Final   Nitrite, UA 89/87/7976 Negative   Final   Leukocytes, UA 06/11/2022 Trace (A)  Negative Final  Admission on 04/26/2022, Discharged on 04/27/2022  Component Date Value Ref Range Status   Sodium 04/26/2022 135  135 - 145 mmol/L Final   Potassium 04/26/2022 4.0  3.5 - 5.1 mmol/L Final   Chloride 04/26/2022 98  98 - 111 mmol/L Final   CO2 04/26/2022 28  22 - 32 mmol/L Final   Glucose, Bld 04/26/2022 420 (H)  70 - 99 mg/dL Final   BUN 91/72/7976 14  6 - 20 mg/dL Final   Creatinine, Ser 04/26/2022 0.91  0.44 - 1.00 mg/dL Final   Calcium  04/26/2022 10.1  8.9 - 10.3 mg/dL Final   GFR, Estimated 04/26/2022 >60  >60 mL/min Final   Anion gap 04/26/2022 9  5 - 15 Final   WBC 04/26/2022 6.0  4.0 - 10.5 K/uL Final   RBC 04/26/2022 5.29 (H)  3.87 - 5.11 MIL/uL Final   Hemoglobin 04/26/2022 14.5  12.0 - 15.0 g/dL Final   HCT 91/72/7976 43.9  36.0 - 46.0 % Final  MCV 04/26/2022 83.0  80.0 - 100.0 fL Final   MCH 04/26/2022 27.4  26.0 - 34.0 pg Final   MCHC 04/26/2022 33.0  30.0 - 36.0 g/dL Final   RDW 91/72/7976 12.8  11.5 - 15.5 % Final   Platelets 04/26/2022 349  150 - 400 K/uL Final   nRBC 04/26/2022 0.0  0.0 - 0.2 % Final   Color, Urine 04/26/2022 COLORLESS (A)  YELLOW Final   APPearance 04/26/2022 CLEAR  CLEAR Final   Specific Gravity, Urine 04/26/2022 1.007  1.005 - 1.030 Final   pH 04/26/2022 6.5  5.0 - 8.0 Final   Glucose, UA 04/26/2022 >1,000 (A)  NEGATIVE mg/dL Final   Hgb urine dipstick 04/26/2022 NEGATIVE  NEGATIVE Final   Bilirubin Urine 04/26/2022 NEGATIVE  NEGATIVE Final   Ketones, ur 04/26/2022 NEGATIVE  NEGATIVE mg/dL Final   Protein, ur 91/72/7976 NEGATIVE  NEGATIVE mg/dL Final   Nitrite 91/72/7976 NEGATIVE  NEGATIVE Final   Leukocytes,Ua 04/26/2022 NEGATIVE  NEGATIVE Final   RBC / HPF 04/26/2022 0-5  0 - 5  RBC/hpf Final   WBC, UA 04/26/2022 0-5  0 - 5 WBC/hpf Final   Squamous Epithelial / HPF 04/26/2022 0-5  0 - 5 Final   Glucose-Capillary 04/26/2022 397 (H)  70 - 99 mg/dL Final   SARS Coronavirus 2 by RT PCR 04/26/2022 NEGATIVE  NEGATIVE Final   Total Protein 04/26/2022 7.3  6.5 - 8.1 g/dL Final   Albumin 91/72/7976 4.3  3.5 - 5.0 g/dL Final   AST 91/72/7976 24  15 - 41 U/L Final   ALT 04/26/2022 16  0 - 44 U/L Final   Alkaline Phosphatase 04/26/2022 60  38 - 126 U/L Final   Total Bilirubin 04/26/2022 0.7  0.3 - 1.2 mg/dL Final   Bilirubin, Direct 04/26/2022 0.1  0.0 - 0.2 mg/dL Final   Indirect Bilirubin 04/26/2022 0.6  0.3 - 0.9 mg/dL Final  No image results found. No results found.       ASSESSMENT & PLAN   Assessment & Plan Vitamin D deficiency Vitamin D deficiency contributes to osteopenia and musculoskeletal symptoms. Improvement is noted with vitamin D supplementation, with decreased bone pain and muscle weakness. Continue vitamin D supplementation 2000 IU daily with calcium  and monitor bone density and symptoms. Osteopenia, unspecified location Vitamin D deficiency contributes to osteopenia and musculoskeletal symptoms. Improvement is noted with vitamin D supplementation, with decreased bone pain and muscle weakness. Continue vitamin D supplementation 2000 IU daily with calcium  and monitor bone density and symptoms. Chronic kidney disease, stage 2 (mild) Continue(s) with losartan  but staying off Jardiance  Advised patient let me know if microalbuminuria - will consider Kerendia. Mixed hyperlipidemia Mixed hyperlipidemia was previously treated with Repatha , discontinued due to joint pain. Leqvio (inclisiran) will be initiated with financial assistance secured. Previous intolerance to other lipid-lowering therapies makes Leqvio a promising alternative. Initiate Leqvio therapy and monitor lipid levels regularly. Depressive disorder Depressive disorder is managed with olanzapine   (Zyprexa ) 20 mg at bedtime. There is a discussion about psychiatric follow-up due to the high dose and potential side effects on diabetes and weight. She acknowledges the need for psychiatric evaluation and is open to referral. Continue olanzapine  20 mg at bedtime and refer to psychiatry for medication management. Bipolar 1 disorder (HCC) Resolved from problem list as she doesn't feel this applies any more, no mania in decade. Primary hypertension Hypertension is well-controlled with losartan  25 mg daily, which also provides renal protection. Continue losartan  25 mg daily and monitor blood pressure  regularly. 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 Weight gain is attributed to previous low thyroid  function and improved glycemic control. Continue levothyroxine  120 mcg daily and monitor thyroid  function tests regularly. Marijuana dependence (HCC) She uses medically for gastroparesis Pure hypercholesterolemia Mixed hyperlipidemia was previously treated with Repatha , discontinued due to joint pain. Leqvio (inclisiran) will be initiated with financial assistance secured. Previous intolerance to other lipid-lowering therapies makes Leqvio a promising alternative. Initiate Leqvio therapy and monitor lipid levels regularly. Tobacco use disorder Tobacco use disorder involves current vaping. There is a discussion about transitioning to less harmful alternatives like Zin. She is considering trying Zin as a harm reduction strategy. Consider transitioning to Zin as a harm reduction strategy. Type 1 diabetes mellitus with hyperglycemia (HCC) Type 1 diabetes mellitus is now well-controlled with insulin  pump therapy, with A1c improved to 8.0 and no recent hypoglycemic episodes. Jardiance  was discontinued due to UTIs and the risk of DKA in type 1 diabetes. The insulin  pump has significantly reduced  hypoglycemia risk. Continue insulin  pump therapy and monitor blood glucose levels regularly. Diabetic gastroparesis (HCC) Occasional nausea is reported, previously managed with Marinol , but discontinued due to cost. Cannabis is used effectively and affordably for symptom management. 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 She continues to follow up with a cardiologist. Continue nitroglycerin  as needed.  ORDER ASSOCIATIONS  #   DIAGNOSIS / CONDITION ICD-10 ENCOUNTER ORDER     ICD-10-CM   1. Vitamin D deficiency  E55.9     2. Osteopenia, unspecified location  M85.80     3. Chronic kidney disease, stage 2 (mild)  N18.2     4. Mixed hyperlipidemia  E78.2     5. Depressive disorder  F32.A Ambulatory referral to Psychiatry    6. Bipolar 1 disorder (HCC)  F31.9 OLANZapine  (ZYPREXA ) 20 MG tablet    7. Primary hypertension  I10 losartan  (COZAAR ) 25 MG tablet    8. Postablative hypothyroidism  E89.0     9. Marijuana dependence (HCC)  F12.20     10. Pure hypercholesterolemia  E78.00     11. Tobacco use disorder  F17.200     12. Type 1 diabetes mellitus with hyperglycemia (HCC)  E10.65     13. Diabetic gastroparesis (HCC)  E11.43    K31.84     14. Coronary artery spasm (HCC)  I20.1      Meds ordered this encounter  Medications   OLANZapine  (ZYPREXA ) 20 MG tablet    Sig: Take 1 tablet (20 mg total) by mouth at bedtime.    Dispense:  90 tablet    Refill:  0   losartan  (COZAAR ) 25 MG tablet    Sig: Take 1 tablet (25 mg total) by mouth daily.    Dispense:  90 tablet    Refill:  4   Orders Placed This Encounter  Procedures   Ambulatory referral to Psychiatry    Referral Priority:   Routine    Referral Reason:   Specialty Services Required    Number of Visits Requested:   1     This document was synthesized by artificial intelligence ([employer]bridge) using HIPAA-compliant recording of the clinical interaction;   We  discussed the use of AI scribe software for clinical note transcription with the patient, who gave verbal consent to proceed. additional Info: This encounter employed state-of-the-art, real-time, collaborative documentation. The patient actively  reviewed and assisted in updating their electronic medical record on a shared screen, ensuring transparency and facilitating joint problem-solving for the problem list, overview, and plan. This approach promotes accurate, informed care. The treatment plan was discussed and reviewed in detail, including medication safety, potential side effects, and all patient questions. We confirmed understanding and comfort with the plan. Follow-up instructions were established, including contacting the office for any concerns, returning if symptoms worsen, persist, or new symptoms develop, and precautions for potential emergency department visits.

## 2024-03-21 NOTE — Assessment & Plan Note (Signed)
 Coronary artery spasms are managed with nitroglycerin , with no recent episodes. She continues to follow up with a cardiologist. Continue nitroglycerin  as needed.

## 2024-03-21 NOTE — Assessment & Plan Note (Signed)
 Continue(s) with losartan  but staying off Jardiance  Advised patient let me know if microalbuminuria - will consider Kerendia.

## 2024-03-21 NOTE — Assessment & Plan Note (Signed)
 Tobacco use disorder involves current vaping. There is a discussion about transitioning to less harmful alternatives like Zin. She is considering trying Zin as a harm reduction strategy. Consider transitioning to Zin as a harm reduction strategy.

## 2024-03-21 NOTE — Assessment & Plan Note (Signed)
 Type 1 diabetes mellitus is now well-controlled with insulin  pump therapy, with A1c improved to 8.0 and no recent hypoglycemic episodes. Jardiance  was discontinued due to UTIs and the risk of DKA in type 1 diabetes. The insulin  pump has significantly reduced hypoglycemia risk. Continue insulin  pump therapy and monitor blood glucose levels regularly.

## 2024-03-28 DIAGNOSIS — E7800 Pure hypercholesterolemia, unspecified: Secondary | ICD-10-CM | POA: Diagnosis not present

## 2024-04-04 DIAGNOSIS — E1065 Type 1 diabetes mellitus with hyperglycemia: Secondary | ICD-10-CM | POA: Diagnosis not present

## 2024-05-05 DIAGNOSIS — E1065 Type 1 diabetes mellitus with hyperglycemia: Secondary | ICD-10-CM | POA: Diagnosis not present

## 2024-05-29 DIAGNOSIS — E1065 Type 1 diabetes mellitus with hyperglycemia: Secondary | ICD-10-CM | POA: Diagnosis not present

## 2024-06-04 DIAGNOSIS — E1065 Type 1 diabetes mellitus with hyperglycemia: Secondary | ICD-10-CM | POA: Diagnosis not present

## 2024-06-22 ENCOUNTER — Ambulatory Visit: Admitting: Internal Medicine

## 2024-06-28 ENCOUNTER — Encounter: Payer: Self-pay | Admitting: Internal Medicine

## 2024-06-28 ENCOUNTER — Ambulatory Visit (INDEPENDENT_AMBULATORY_CARE_PROVIDER_SITE_OTHER): Admitting: Internal Medicine

## 2024-06-28 VITALS — BP 110/68 | HR 86 | Temp 98.0°F | Ht 61.0 in | Wt 143.4 lb

## 2024-06-28 DIAGNOSIS — E1065 Type 1 diabetes mellitus with hyperglycemia: Secondary | ICD-10-CM | POA: Diagnosis not present

## 2024-06-28 DIAGNOSIS — E1022 Type 1 diabetes mellitus with diabetic chronic kidney disease: Secondary | ICD-10-CM

## 2024-06-28 DIAGNOSIS — E559 Vitamin D deficiency, unspecified: Secondary | ICD-10-CM

## 2024-06-28 DIAGNOSIS — K3184 Gastroparesis: Secondary | ICD-10-CM | POA: Diagnosis not present

## 2024-06-28 DIAGNOSIS — E1143 Type 2 diabetes mellitus with diabetic autonomic (poly)neuropathy: Secondary | ICD-10-CM

## 2024-06-28 DIAGNOSIS — Z5987 Material hardship due to limited financial resources, not elsewhere classified: Secondary | ICD-10-CM

## 2024-06-28 DIAGNOSIS — F32A Depression, unspecified: Secondary | ICD-10-CM | POA: Diagnosis not present

## 2024-06-28 DIAGNOSIS — E1021 Type 1 diabetes mellitus with diabetic nephropathy: Secondary | ICD-10-CM

## 2024-06-28 LAB — MICROALBUMIN / CREATININE URINE RATIO
Creatinine,U: 120 mg/dL
Microalb Creat Ratio: 6.3 mg/g (ref 0.0–30.0)
Microalb, Ur: 0.8 mg/dL (ref 0.0–1.9)

## 2024-06-28 LAB — POCT GLYCOSYLATED HEMOGLOBIN (HGB A1C): Hemoglobin A1C: 8.2 % — AB (ref 4.0–5.6)

## 2024-06-28 MED ORDER — OLANZAPINE 20 MG PO TABS: 20.0000 mg | ORAL_TABLET | Freq: Every day | ORAL | 4 refills | Status: AC

## 2024-06-28 MED ORDER — VITAMIN K2-VITAMIN D3 90-125 MCG PO CAPS: 1.0000 | ORAL_CAPSULE | Freq: Every day | ORAL | 4 refills | Status: AC

## 2024-06-28 NOTE — Assessment & Plan Note (Signed)
 Type 1 diabetes mellitus with hyperglycemia, nephropathy, and gastroparesis   Diabetes management is challenging due to financial constraints affecting access to insulin  pump supplies, leading to elevated A1c. Nephropathy is present but not severe. Real-time monitoring and adjustment with the insulin  pump are crucial to prevent hypoglycemia. Refer to [facility] for assistance in reducing costs of diabetes supplies. Order a urine protein test to assess kidney damage. Coordinate with Dr. Ballen for diabetes management and pump adjustments. Encourage dietary modifications, including increased intake of eggs, avocado, extra virgin olive oil, and fish to protect arteries from sugar damage.

## 2024-06-28 NOTE — Patient Instructions (Addendum)
 It was a pleasure seeing you today! Your health and satisfaction are our top priorities.  Karen Cone, MD  VISIT SUMMARY: Today, we discussed your diabetes management, depression, kidney health, and financial concerns related to medication costs. We also reviewed your current medications and made some adjustments to better manage your conditions.  YOUR PLAN: -TYPE 1 DIABETES MELLITUS WITH HYPERGLYCEMIA, NEPHROPATHY, AND GASTROPARESIS: Type 1 diabetes is a condition where your body does not produce insulin , leading to high blood sugar levels. We discussed the challenges you face in managing your diabetes due to the high cost of insulin  pump supplies. We will refer you to [facility] for assistance in reducing these costs. We also ordered a urine protein test to check for kidney damage and will coordinate with Dr. Ballen for your diabetes management and pump adjustments. Please try to include more eggs, avocado, extra virgin olive oil, and fish in your diet to protect your arteries from sugar damage.  -CHRONIC KIDNEY DISEASE, STAGE 2 (MILD): Chronic kidney disease stage 2 means you have mild kidney damage. We will continue to protect your kidneys by managing your blood sugar levels. We ordered a urine protein test to assess any further kidney damage and will consider alternative medications if needed.  -PRIMARY HYPERTENSION: Primary hypertension is high blood pressure without a known cause. It is currently managed with losartan . If the urine protein test shows kidney damage, we may need to increase your losartan  dose to better protect your kidneys.  -DEPRESSION IN REMISSION, TAPERING OFF OLANZAPINE : Your depression is currently in remission, meaning your symptoms have improved. You have been off olanzapine  for a week and feel stable. We will prescribe olanzapine  20 mg with instructions to taper off as tolerated. If you remain stable without it, you can discontinue it.  -MATERIAL HARDSHIP DUE TO LIMITED  FINANCIAL RESOURCES: Financial constraints are affecting your ability to afford diabetes supplies and medications. We will refer you to [facility] for financial assistance and encourage cost-saving measures such as dietary changes to reduce medication needs.  -VITAMIN D DEFICIENCY AND OSTEOPENIA: Vitamin D deficiency and osteopenia mean you have low levels of vitamin D and reduced bone density. You are currently taking vitamin D supplements but still experience bone pain and muscle weakness. We advise increasing your dietary intake of vitamin D and K2 through foods like eggs. If symptoms persist, we may increase your vitamin D supplementation.  -MARIJUANA DEPENDENCE: You use marijuana to manage nausea. We will continue to monitor your marijuana use and its effects on your health.  INSTRUCTIONS: Please follow up with [facility] for assistance with diabetes supplies. We have ordered a urine protein test to assess kidney damage. Continue to monitor your blood sugar levels and adjust your insulin  pump as needed. Follow the dietary recommendations provided and consider increasing your intake of vitamin D and K2. If you remain stable without olanzapine , you can  discontinue it. We will coordinate with Dr. Ballen for your diabetes management and pump adjustments.  Your Providers PCP: Hayden Karen MATSU, MD,  [phone]) Referring Provider: Cone Karen MATSU, MD,  [phone]) Care Team Provider: Maranda Leim DEL, MD,  [phone]) Care Team Provider: Maree Lonni Inks, MD,  [phone])  NEXT STEPS: [x]  Early Intervention: Schedule sooner appointment, call our on-call services, or go to emergency room if there is any significant Increase in pain or discomfort New or worsening symptoms Sudden or severe changes in your health [x]  Flexible Follow-Up: We recommend a Return in about 6 months (around 12/27/2024) for annual preventive  care visit. for optimal routine care. This allows for progress monitoring  and treatment adjustments. [x]  Preventive Care: Schedule your annual preventive care visit! It's typically covered by insurance and helps identify potential health issues early. [x]  Lab & X-ray Appointments: Incomplete tests scheduled today, or call to schedule. X-rays: [facility] (M-F, 8:30am-noon or 1pm-5pm). [x]  Medical Information Release: Sign a release form at front desk to obtain relevant medical information we don't have.  MAKING THE MOST OF OUR FOCUSED 20 MINUTE APPOINTMENTS: [x]   Clearly state your top concerns at the beginning of the visit to focus our discussion [x]   If you anticipate you will need more time, please inform the front desk during scheduling - we can book multiple appointments in the same week. [x]   If you have transportation problems- use our convenient video appointments or ask about transportation support. [x]   We can get down to business faster if you use MyChart to update information before the visit and submit non-urgent questions before your visit. Thank you for taking the time to provide details through MyChart.  Let our nurse know and she can import this information into your encounter documents.  Arrival and Wait Times: [x]   Arriving on time ensures that everyone receives prompt attention. [x]   Early morning (8a) and afternoon (1p) appointments tend to have shortest wait times. [x]   Unfortunately, we cannot delay appointments for late arrivals or hold slots during phone calls.  Getting Answers and Following Up [x]   Simple Questions & Concerns: For quick questions or basic follow-up after your visit, reach us  at [phone] or MyChart messaging. [x]   Complex Concerns: If your concern is more complex, scheduling an appointment might be best. Discuss this with the staff to find the most suitable option. [x]   Lab & Imaging Results: We'll contact you directly if results are abnormal or you don't use MyChart. Most normal results will be on MyChart  within 2-3 business days, with a review message from Dr. Jesus. Haven't heard back in 2 weeks? Need results sooner? Contact us  at [phone]. [x]   Referrals: Our referral coordinator will manage specialist referrals. The specialist's office should contact you within 2 weeks to schedule an appointment. Call us  if you haven't heard from them after 2 weeks.  Staying Connected [x]   MyChart: Activate your MyChart for the fastest way to access results and message us . See the last page of this paperwork for instructions on how to activate.  Bring to Your Next Appointment [x]   Medications: Please bring all your medication bottles to your next appointment to ensure we have an accurate record of your prescriptions. [x]   Health Diaries: If you're monitoring any health conditions at home, keeping a diary of your readings can be very helpful for discussions at your next appointment.  Billing [x]   X-ray & Lab Orders: These are billed by separate companies. Contact the invoicing company directly for questions or concerns. [x]   Visit Charges: Discuss any billing inquiries with our administrative services team.  Your Satisfaction Matters [x]   Share Your Experience: We strive for your satisfaction! If you have any complaints, or preferably compliments, please let Dr. Jesus know directly or contact our Practice Administrators, Manuelita Rubin or [employer], by asking at the front desk.   Reviewing Your Records [x]   Review this early draft of your clinical encounter notes below and the final encounter summary tomorrow on MyChart after its been completed.  All orders placed so far are visible here: Type 1 diabetes  mellitus with hyperglycemia (HCC) -     POCT glycosylated hemoglobin (Hb A1C) -     AMB Referral [facility]  Diabetic gastroparesis (HCC)  Depressive disorder -     OLANZapine ; Take 1 tablet (20 mg total) by mouth at bedtime. Taper off as tolerated.  Dispense: 90 tablet; Refill:  4  Material hardship due to limited financial resources  Vitamin D deficiency -     Vitamin K2-Vitamin D3; Take 1 tablet by mouth daily at 6 (six) AM.  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

## 2024-06-28 NOTE — Progress Notes (Signed)
 ==============================  [facility]: [phone]   -- Medical Office Visit --  Patient: Karen Hayden      Age: 48 y.o.       Sex:  female  Date:   06/28/2024 Today's Healthcare Provider: Bernardino KANDICE Cone, MD  ==============================   Chief Complaint: Diabetes and Medication Refill   Discussed the use of AI scribe software for clinical note transcription with the patient, who gave verbal consent to proceed.  History of Present Illness 48 year old female with diabetes and depression who presents for medication management and cost concerns.  She has been experiencing depressive symptoms, which she attributes partly to systemic issues in healthcare. She was initially prescribed Zyprexa  at 10 mg for depression but mistakenly took 20 mg. She has been off Zyprexa  for a week and feels stable.  Her diabetes is managed with a continuous glucose monitor and an insulin  pump. She has faced difficulties obtaining insulin  pump supplies due to high costs, with a recent quote of $856 for supplies. She found a more affordable option at $246 for a three-month supply. Her A1c remains slightly elevated, and she has a history of severe hypoglycemic episodes, which have improved with the use of her current insulin  pump.  She has a history of diabetic nephropathy, previously managed with Jardiance , which caused recurrent urinary tract infections, leading to its discontinuation. She is currently on gabapentin  for neuropathy. She is currently on losartan  for kidney protection and gabapentin  for neuropathy.  She is also on thyroid  medication, specifically 125 mcg of levothyroxine , and takes 2000 IU of vitamin D daily. She reports ongoing bone pain and muscle weakness despite vitamin D supplementation.  Socially, she uses cannabis, which she finds helpful for nausea. She has financial concerns impacting her ability to afford medications and supplies, and she is  exploring options to reduce costs.  Background Reviewed: Problem List: has Type 1 diabetes mellitus (HCC); SEIZURE DISORDER; Diabetic gastroparesis (HCC); Tobacco use disorder; Marijuana dependence (HCC); Coronary artery spasm; Hypothyroidism; Depressive disorder; Hereditary and idiopathic neuropathy, unspecified; Hypertension; Joint pain; Chronic kidney disease, stage 2 (mild); Statin intolerance; Hyperlipidemia; Vitamin D deficiency; and Osteopenia on their problem list. 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), Change in bowel habits (06/21/2013), Chronic kidney infection, Constipation (01/26/2014), Coronary artery spasm, Depressive disorder (06/11/2022), Diabetes mellitus type I (HCC), Diabetic gastroparesis (HCC), Diarrhea (01/22/2023), DM (diabetes mellitus) (HCC), Gastroparesis, Hyperlipidemia, Hypertension, Hypothyroidism, Insulin  long-term use (HCC) (01/16/2023), Long term current use of oral hypoglycemic drug (01/16/2023), Material hardship due to limited financial resources (03/01/2023), Nausea & vomiting (05/03/2019), Nausea with vomiting (06/21/2013), Palpitations (07/06/2018), Protein-calorie malnutrition, severe (01/29/2014), Retinal tear (01/15/2023), Seizure disorder (HCC), Umbilical hernia, and Unintentional weight loss (06/11/2022). Past Surgical History:   has a past surgical history that includes Vaginal hysterectomy; Tonsillectomy; LEFT HEART CATH AND CORONARY ANGIOGRAPHY (N/A, 02/18/2018); Adenoidectomy; and Cardiac catheterization. Social History:   reports that she quit smoking about 6 years ago. Her smoking use included cigarettes. She started smoking about 19 years ago. She has a 13 pack-year smoking history. She has never used smokeless tobacco. She  reports that she does not currently use drugs after having used the following drugs: Marijuana. She reports that she does not drink alcohol . Family History:  family history includes Anxiety disorder in her maternal grandmother, mother, and paternal  aunt; Depression in her maternal grandmother, mother, and paternal aunt; Diabetes in her maternal grandmother, mother, paternal aunt, paternal aunt, and paternal grandmother; Early death in her mother; Goiter in her maternal grandmother; Hearing loss in her father; Heart attack in her mother; Heart disease in her mother; Hyperlipidemia in her father and mother; Hypertension in her father and mother; Mood Disorder in her father; Ovarian cancer in her paternal grandmother; Pulmonary embolism in her daughter; Thyroid  disease in her father. Allergies:  is allergic to seroquel [quetiapine fumarate], isosorbide  nitrate, lipitor [atorvastatin], codeine, and isosorbide .   Medication Reconciliation: Current Outpatient Medications on File Prior to Visit  Medication Sig   ADACEL 12-30-13.5 LF-MCG/0.5 injection    Continuous Glucose Sensor (DEXCOM G7 SENSOR) MISC Apply sensor every 10 days. Pharmacy please provide 3 boxes (1 sensor per week) (Patient not taking: Reported on 03/21/2024)   dapagliflozin  propanediol (FARXIGA ) 10 MG TABS tablet Take 1 tablet (10 mg total) by mouth daily before breakfast. (Patient not taking: Reported on 02/23/2024)   Evolocumab  (REPATHA  SURECLICK) 140 MG/ML SOAJ Inject 140 mg into the skin every 14 (fourteen) days. (Patient not taking: Reported on 02/23/2024)   FLUBLOK 0.5 ML SOSY    gabapentin  (NEURONTIN ) 300 MG capsule Take 300 mg by mouth daily.   Glucagon  (GVOKE HYPOPEN  2-PACK) 0.5 MG/0.1ML SOAJ Inject 0.5 mg into the skin daily as needed (for low blood sugar).   Insulin  Disposable Pump (OMNIPOD 5 G6 INTRO, GEN 5,) KIT 0.8 Units/hr by Does not apply route continuous. Using OmniPod along with Dexcom G6 to manage diabetes type 1 brittle please  see Dr. For assistance with setting up but initial settings are planned to be 0.8 units/h continuous and 10-1 bolus ratio (Patient not taking: Reported on 02/23/2024)   Insulin  Disposable Pump (OMNIPOD 5 G7 INTRO, GEN 5,) KIT 1 each by Does not apply route continuous. brittle type 1 diabetes mellitus retinopathy and history of level 3 hypoglycemia (glucose <54 mg/dL and altered mental  that resulted in hospital admission and intubation.   HGBA1C  11.3 01/15/2023 (Patient not taking: Reported on 02/23/2024)   Insulin  Disposable Pump (OMNIPOD 5 G7 PODS, GEN 5,) MISC 5 each by Does not apply route daily at 6 (six) AM. (Patient not taking: Reported on 02/23/2024)   insulin  glargine (LANTUS  SOLOSTAR) 100 UNIT/ML Solostar Pen Inject 25 Units into the skin in the morning. Replaces insulin  NPH.   Insulin  Infusion Pump (MINIMED 780G INSULIN  PUMP) KIT See admin instructions.   insulin  NPH Human (NOVOLIN N) 100 UNIT/ML injection Inject 16 Units into the skin 2 (two) times daily before a meal.    insulin  regular (NOVOLIN R) 100 units/mL injection Inject 0.1 mLs (10 Units total) into the skin 3 (three) times daily before meals. 1 unit per 8 grams of carbs.at meals and to correct high blood sugar.   Insulin  Syringe-Needle U-100 (INSULIN  SYRINGE .5CC/28G) 28G X 1/2 0.5 ML MISC Use as instructed to administer insulin    levothyroxine  (SYNTHROID ) 125 MCG tablet Take 1 tablet (125 mcg total) by mouth daily. Replaces 100 mcg daily dosing.  Recheck thyroid  levels 6-8 weeks after dose change   levothyroxine  (SYNTHROID ) 137 MCG tablet Take 137 mcg by mouth every morning. (Patient not taking: Reported on 03/21/2024)   losartan  (COZAAR ) 25 MG tablet Take 1 tablet (25 mg total) by mouth daily.   Multiple Vitamins-Minerals (MULTI FOR HER) TABS as directed Orally   NOVOLOG  100 UNIT/ML injection SMARTSIG:100 Unit(s) SUB-Q Daily   OLANZapine  (ZYPREXA ) 20 MG tablet Take  1 tablet (20 mg total) by mouth at bedtime.   ondansetron   (ZOFRAN -ODT) 4 MG disintegrating tablet Take 1 tablet (4 mg total) by mouth every 8 (eight) hours as needed for nausea or vomiting (for nausea from wegovy or other source).   Probiotic CHEW as directed Orally   trimethoprim  (TRIMPEX ) 100 MG tablet Take 100 mg by mouth daily.   TWINRIX 720-20 ELU-MCG/ML injection    No current facility-administered medications on file prior to visit.   Medications Discontinued During This Encounter  Medication Reason   empagliflozin  (JARDIANCE ) 10 MG TABS tablet Side effect (s)     Physical Exam:    06/28/2024   11:21 AM 03/21/2024   10:12 AM 02/23/2024   11:20 AM  Vitals with BMI  Height 5' 1 5' 1 5' 1  Weight 143 lbs 6 oz 141 lbs 10 oz 143 lbs  BMI 27.11 26.77 27.03  Systolic 110 120   Diastolic 68 78   Pulse 86 90   Vital signs reviewed.  Nursing notes reviewed. Weight trend reviewed. Physical Activity: Sufficiently Active (02/23/2024)   Exercise Vital Sign    Days of Exercise per Week: 7 days    Minutes of Exercise per Session: 30 min   General Appearance:  No acute distress appreciable.   Well-groomed, healthy-appearing female.  Well proportioned with no abnormal fat distribution.  Good muscle tone. Pulmonary:  Normal work of breathing at rest, no respiratory distress apparent. SpO2: 98 %  Musculoskeletal: All extremities are intact.  Neurological:  Awake, alert, oriented, and engaged.  No obvious focal neurological deficits or cognitive impairments.  Sensorium seems unclouded.   Speech is clear and coherent with logical content. Psychiatric:  Appropriate mood, pleasant and cooperative demeanor, thoughtful and engaged during the exam   Verbalized to patient: Physical Exam    Results:   Verbalized to patient: Results LABS   Hemoglobin A1c: 8.6%     06/28/2024   11:29 AM 03/21/2024   10:18 AM 02/23/2024   11:48 AM 06/18/2023   11:45 AM  PHQ 2/9 Scores  PHQ - 2 Score 0 0 0 2  PHQ- 9 Score 0 0  8    Office Visit on  06/28/2024  Component Date Value Ref Range Status   Hemoglobin A1C 06/28/2024 8.2 (A)  4.0 - 5.6 % Final   Microalb, Ur 06/28/2024 0.8  0.0 - 1.9 mg/dL Final   Creatinine,U 89/70/7974 120.0  mg/dL Final   Microalb Creat Ratio 06/28/2024 6.3  0.0 - 30.0 mg/g Final  Scanned Document on 10/20/2023  Component Date Value Ref Range Status   HM Diabetic Eye Exam 10/19/2023 No Retinopathy  No Retinopathy Final  Office Visit on 06/18/2023  Component Date Value Ref Range Status   Hgb A1c MFr Bld 06/18/2023 9.3 (H)  4.6 - 6.5 % Final   Color, UA 06/18/2023 pale yellow   Final   Clarity, UA 06/18/2023 clear   Final   Glucose, UA 06/18/2023 Positive (A)  Negative Final   Bilirubin, UA 06/18/2023 Negative   Final   Ketones, UA 06/18/2023 Negative   Final   Spec Grav, UA 06/18/2023 1.010  1.010 - 1.025 Final   Blood, UA 06/18/2023 Negative   Final   pH, UA 06/18/2023 6.0  5.0 - 8.0 Final   Protein, UA 06/18/2023 Negative  Negative Final   Urobilinogen, UA 06/18/2023 0.2  0.2 or 1.0 E.U./dL Final   Nitrite, UA 89/81/7975 Positive   Final   Leukocytes, UA 06/18/2023 Negative  Negative Final   Sodium 06/18/2023 139  135 - 145 mEq/L Final   Potassium 06/18/2023 4.7  3.5 - 5.1 mEq/L Final   Chloride 06/18/2023 99  96 - 112 mEq/L Final   CO2 06/18/2023 34 (H)  19 - 32 mEq/L Final   Glucose, Bld 06/18/2023 93  70 - 99 mg/dL Final   BUN 89/81/7975 12  6 - 23 mg/dL Final   Creatinine, Ser 06/18/2023 0.84  0.40 - 1.20 mg/dL Final   Total Bilirubin 06/18/2023 0.4  0.2 - 1.2 mg/dL Final   Alkaline Phosphatase 06/18/2023 81  39 - 117 U/L Final   AST 06/18/2023 25  0 - 37 U/L Final   ALT 06/18/2023 19  0 - 35 U/L Final   Total Protein 06/18/2023 7.4  6.0 - 8.3 g/dL Final   Albumin 89/81/7975 4.5  3.5 - 5.2 g/dL Final   GFR 89/81/7975 82.29  >60.00 mL/min Final   Calcium  06/18/2023 10.9 (H)  8.4 - 10.5 mg/dL Final   WBC 89/81/7975 6.0  4.0 - 10.5 K/uL Final   RBC 06/18/2023 5.32 (H)  3.87 - 5.11 Mil/uL  Final   Hemoglobin 06/18/2023 14.6  12.0 - 15.0 g/dL Final   HCT 89/81/7975 45.2  36.0 - 46.0 % Final   MCV 06/18/2023 85.0  78.0 - 100.0 fl Final   MCHC 06/18/2023 32.3  30.0 - 36.0 g/dL Final   RDW 89/81/7975 13.9  11.5 - 15.5 % Final   Platelets 06/18/2023 378.0  150.0 - 400.0 K/uL Final   Neutrophils Relative % 06/18/2023 72.5  43.0 - 77.0 % Final   Lymphocytes Relative 06/18/2023 13.1  12.0 - 46.0 % Final   Monocytes Relative 06/18/2023 8.1  3.0 - 12.0 % Final   Eosinophils Relative 06/18/2023 5.5 (H)  0.0 - 5.0 % Final   Basophils Relative 06/18/2023 0.8  0.0 - 3.0 % Final   Neutro Abs 06/18/2023 4.4  1.4 - 7.7 K/uL Final   Lymphs Abs 06/18/2023 0.8  0.7 - 4.0 K/uL Final   Monocytes Absolute 06/18/2023 0.5  0.1 - 1.0 K/uL Final   Eosinophils Absolute 06/18/2023 0.3  0.0 - 0.7 K/uL Final   Basophils Absolute 06/18/2023 0.0  0.0 - 0.1 K/uL Final   Cholesterol 06/18/2023 179  <200 mg/dL Final   HDL 89/81/7975 58  > OR = 50 mg/dL Final   Triglycerides 89/81/7975 120  <150 mg/dL Final   LDL Cholesterol (Calc) 06/18/2023 99  mg/dL (calc) Final   Total CHOL/HDL Ratio 06/18/2023 3.1  <4.9 (calc) Final   Non-HDL Cholesterol (Calc) 06/18/2023 121  <130 mg/dL (calc) Final   Color, Urine 06/18/2023 YELLOW  Yellow;Lt. Yellow;Straw;Dark Yellow;Amber;Green;Red;Brown Final   APPearance 06/18/2023 CLEAR  Clear;Turbid;Slightly Cloudy;Cloudy Final   Specific Gravity, Urine 06/18/2023 <=1.005 (A)  1.000 - 1.030 Final   pH 06/18/2023 6.5  5.0 - 8.0 Final   Total Protein, Urine 06/18/2023 NEGATIVE  Negative Final   Urine Glucose 06/18/2023 >=1000 (A)  Negative Final   Ketones, ur 06/18/2023 NEGATIVE  Negative Final   Bilirubin Urine 06/18/2023 NEGATIVE  Negative Final   Hgb urine dipstick 06/18/2023 NEGATIVE  Negative Final   Urobilinogen, UA 06/18/2023 0.2  0.0 - 1.0 Final   Leukocytes,Ua 06/18/2023 NEGATIVE  Negative Final   Nitrite 06/18/2023 NEGATIVE  Negative Final   WBC, UA 06/18/2023  0-2/hpf  0-2/hpf Final   RBC / HPF 06/18/2023 none seen  0-2/hpf Final   Squamous Epithelial / HPF 06/18/2023 Rare(0-4/hpf)  Rare(0-4/hpf) Final  Bacteria, UA 06/18/2023 Many(>50/hpf) (A)  None Final   TSH W/REFLEX TO FT4 06/18/2023 1.74  mIU/L Final  Office Visit on 02/08/2023  Component Date Value Ref Range Status   Color, Urine 02/08/2023 YELLOW  Yellow;Lt. Yellow;Straw;Dark Yellow;Amber;Green;Red;Brown Final   APPearance 02/08/2023 CLEAR  Clear;Turbid;Slightly Cloudy;Cloudy Final   Specific Gravity, Urine 02/08/2023 1.010  1.000 - 1.030 Final   pH 02/08/2023 6.5  5.0 - 8.0 Final   Total Protein, Urine 02/08/2023 NEGATIVE  Negative Final   Urine Glucose 02/08/2023 >=1000 (A)  Negative Final   Ketones, ur 02/08/2023 NEGATIVE  Negative Final   Bilirubin Urine 02/08/2023 NEGATIVE  Negative Final   Hgb urine dipstick 02/08/2023 NEGATIVE  Negative Final   Urobilinogen, UA 02/08/2023 0.2  0.0 - 1.0 Final   Leukocytes,Ua 02/08/2023 NEGATIVE  Negative Final   Nitrite 02/08/2023 NEGATIVE  Negative Final   WBC, UA 02/08/2023 0-2/hpf  0-2/hpf Final   RBC / HPF 02/08/2023 none seen  0-2/hpf Final   Squamous Epithelial / HPF 02/08/2023 Rare(0-4/hpf)  Rare(0-4/hpf) Final  Office Visit on 01/22/2023  Component Date Value Ref Range Status   MICRO NUMBER: 01/22/2023 [id number]   Final   SPECIMEN QUALITY: 01/22/2023 Adequate   Final   Sample Source 01/22/2023 URINE   Final   STATUS: 01/22/2023 FINAL   Final   ISOLATE 1: 01/22/2023 Escherichia coli (A)   Final   ISOLATE 2: 01/22/2023 Streptococcus agalactiae (A)   Final  Office Visit on 01/15/2023  Component Date Value Ref Range Status   WBC 01/15/2023 4.8  4.0 - 10.5 K/uL Final   RBC 01/15/2023 5.06  3.87 - 5.11 Mil/uL Final   Hemoglobin 01/15/2023 14.4  12.0 - 15.0 g/dL Final   HCT 94/82/7975 43.0  36.0 - 46.0 % Final   MCV 01/15/2023 85.1  78.0 - 100.0 fl Final   MCHC 01/15/2023 33.6  30.0 - 36.0 g/dL Final   RDW 94/82/7975 13.9  11.5 - 15.5  % Final   Platelets 01/15/2023 327.0  150.0 - 400.0 K/uL Final   Neutrophils Relative % 01/15/2023 74.9  43.0 - 77.0 % Final   Lymphocytes Relative 01/15/2023 13.4  12.0 - 46.0 % Final   Monocytes Relative 01/15/2023 7.9  3.0 - 12.0 % Final   Eosinophils Relative 01/15/2023 2.7  0.0 - 5.0 % Final   Basophils Relative 01/15/2023 1.1  0.0 - 3.0 % Final   Neutro Abs 01/15/2023 3.6  1.4 - 7.7 K/uL Final   Lymphs Abs 01/15/2023 0.6 (L)  0.7 - 4.0 K/uL Final   Monocytes Absolute 01/15/2023 0.4  0.1 - 1.0 K/uL Final   Eosinophils Absolute 01/15/2023 0.1  0.0 - 0.7 K/uL Final   Basophils Absolute 01/15/2023 0.1  0.0 - 0.1 K/uL Final   Sodium 01/15/2023 140  135 - 145 mEq/L Final   Potassium 01/15/2023 4.6  3.5 - 5.1 mEq/L Final   Chloride 01/15/2023 102  96 - 112 mEq/L Final   CO2 01/15/2023 30  19 - 32 mEq/L Final   Glucose, Bld 01/15/2023 122 (H)  70 - 99 mg/dL Final   BUN 94/82/7975 13  6 - 23 mg/dL Final   Creatinine, Ser 01/15/2023 0.84  0.40 - 1.20 mg/dL Final   Total Bilirubin 01/15/2023 0.5  0.2 - 1.2 mg/dL Final   Alkaline Phosphatase 01/15/2023 76  39 - 117 U/L Final   AST 01/15/2023 16  0 - 37 U/L Final   ALT 01/15/2023 13  0 - 35 U/L Final   Total  Protein 01/15/2023 6.9  6.0 - 8.3 g/dL Final   Albumin 94/82/7975 4.4  3.5 - 5.2 g/dL Final   GFR 94/82/7975 82.53  >60.00 mL/min Final   Calcium  01/15/2023 10.0  8.4 - 10.5 mg/dL Final   Cholesterol 94/82/7975 177  0 - 200 mg/dL Final   Triglycerides 94/82/7975 90.0  0.0 - 149.0 mg/dL Final   HDL 94/82/7975 58.30  >39.00 mg/dL Final   VLDL 94/82/7975 18.0  0.0 - 40.0 mg/dL Final   LDL Cholesterol 01/15/2023 101 (H)  0 - 99 mg/dL Final   Total CHOL/HDL Ratio 01/15/2023 3   Final   NonHDL 01/15/2023 118.60   Final   Hgb A1c MFr Bld 01/15/2023 11.3 (H)  4.6 - 6.5 % Final   TSH W/REFLEX TO FT4 01/15/2023 24.69 (H)  mIU/L Final   Free T4 01/15/2023 0.6 (L)  0.8 - 1.8 ng/dL Final  Admission on 88/84/7976, Discharged on 07/15/2022   Component Date Value Ref Range Status   Lipase 07/15/2022 55 (H)  11 - 51 U/L Final   Sodium 07/15/2022 140  135 - 145 mmol/L Final   Potassium 07/15/2022 3.9  3.5 - 5.1 mmol/L Final   Chloride 07/15/2022 101  98 - 111 mmol/L Final   CO2 07/15/2022 30  22 - 32 mmol/L Final   Glucose, Bld 07/15/2022 122 (H)  70 - 99 mg/dL Final   BUN 88/84/7976 18  6 - 20 mg/dL Final   Creatinine, Ser 07/15/2022 0.78  0.44 - 1.00 mg/dL Final   Calcium  07/15/2022 10.0  8.9 - 10.3 mg/dL Final   Total Protein 88/84/7976 7.0  6.5 - 8.1 g/dL Final   Albumin 88/84/7976 4.3  3.5 - 5.0 g/dL Final   AST 88/84/7976 20  15 - 41 U/L Final   ALT 07/15/2022 27  0 - 44 U/L Final   Alkaline Phosphatase 07/15/2022 70  38 - 126 U/L Final   Total Bilirubin 07/15/2022 0.4  0.3 - 1.2 mg/dL Final   GFR, Estimated 07/15/2022 >60  >60 mL/min Final   Anion gap 07/15/2022 9  5 - 15 Final   WBC 07/15/2022 7.6  4.0 - 10.5 K/uL Final   RBC 07/15/2022 5.05  3.87 - 5.11 MIL/uL Final   Hemoglobin 07/15/2022 13.8  12.0 - 15.0 g/dL Final   HCT 88/84/7976 41.1  36.0 - 46.0 % Final   MCV 07/15/2022 81.4  80.0 - 100.0 fL Final   MCH 07/15/2022 27.3  26.0 - 34.0 pg Final   MCHC 07/15/2022 33.6  30.0 - 36.0 g/dL Final   RDW 88/84/7976 13.2  11.5 - 15.5 % Final   Platelets 07/15/2022 350  150 - 400 K/uL Final   nRBC 07/15/2022 0.0  0.0 - 0.2 % Final   Color, Urine 07/15/2022 YELLOW  YELLOW Final   APPearance 07/15/2022 CLEAR  CLEAR Final   Specific Gravity, Urine 07/15/2022 1.014  1.005 - 1.030 Final   pH 07/15/2022 7.5  5.0 - 8.0 Final   Glucose, UA 07/15/2022 NEGATIVE  NEGATIVE mg/dL Final   Hgb urine dipstick 07/15/2022 NEGATIVE  NEGATIVE Final   Bilirubin Urine 07/15/2022 NEGATIVE  NEGATIVE Final   Ketones, ur 07/15/2022 NEGATIVE  NEGATIVE mg/dL Final   Protein, ur 88/84/7976 NEGATIVE  NEGATIVE mg/dL Final   Nitrite 88/84/7976 NEGATIVE  NEGATIVE Final   Leukocytes,Ua 07/15/2022 NEGATIVE  NEGATIVE Final   Glucose-Capillary  07/15/2022 108 (H)  70 - 99 mg/dL Final   Glucose-Capillary 07/15/2022 81  70 - 99 mg/dL Final  Office Visit on 07/15/2022  Component Date Value Ref Range Status   SARS Coronavirus 2 Ag 07/15/2022 Negative  Negative Final   Influenza A, POC 07/15/2022 Negative  Negative Final   Influenza B, POC 07/15/2022 Negative  Negative Final  No image results found. No results found.       ASSESSMENT & PLAN   Assessment & Plan Type 1 diabetes mellitus with hyperglycemia (HCC) Diabetic gastroparesis (HCC) Type 1 diabetes mellitus with hyperglycemia, nephropathy, and gastroparesis   Diabetes management is challenging due to financial constraints affecting access to insulin  pump supplies, leading to elevated A1c. Nephropathy is present but not severe. Real-time monitoring and adjustment with the insulin  pump are crucial to prevent hypoglycemia. Refer to [facility] for assistance in reducing costs of diabetes supplies. Order a urine protein test to assess kidney damage. Coordinate with Dr. Ballen for diabetes management and pump adjustments. Encourage dietary modifications, including increased intake of eggs, avocado, extra virgin olive oil, and fish to protect arteries from sugar damage. Depressive disorder Depression is in remission. Olanzapine  was previously increased to 20 mg but is being tapered off as depression may have been related to physical distress from diabetes. She has been off olanzapine  for a week and feels stable. The goal is to discontinue olanzapine  if she remains stable without it. Prescribe olanzapine  20 mg with instructions to taper off as tolerated. Encourage discontinuation if she remains stable without it. Material hardship due to limited financial resources Material hardship due to limited financial resources   Financial constraints impact her ability to afford diabetes supplies and medications. She is on a fixed income with high costs for insulin  pump supplies, causing significant  financial strain. Refer to [facility] for financial assistance with diabetes supplies. Encourage cost-saving measures such as dietary changes to reduce medication needs. Vitamin D deficiency Vitamin D deficiency and osteopenia   Vitamin D deficiency and osteopenia are present. She is on vitamin D supplementation but still experiences bone pain and muscle weakness. Advise increasing dietary intake of vitamin D and K2 through foods like eggs. Consider increasing vitamin D supplementation if symptoms persist. Diabetic nephropathy associated with type 1 diabetes mellitus (HCC) CKD stage 2 is present. Jardiance  was discontinued due to recurrent UTIs. Kidney protection is a priority, and better sugar control is essential to prevent further damage. Order a urine protein test to assess kidney damage. Consider alternative medications for kidney protection if proteinuria is present.   ORDER ASSOCIATIONS  #   DIAGNOSIS / CONDITION ICD-10 ENCOUNTER ORDER     ICD-10-CM   1. Type 1 diabetes mellitus with hyperglycemia (HCC)  E10.65 POCT HgB A1C    AMB Referral [facility]    2. Diabetic gastroparesis (HCC)  E11.43    K31.84     3. Depressive disorder  F32.A OLANZapine  (ZYPREXA ) 20 MG tablet    4. Material hardship due to limited financial resources  Z59.87     5. Vitamin D deficiency  E55.9 Vitamin D-Vitamin K (VITAMIN K2-VITAMIN D3) 90-125 MCG CAPS    6. Diabetic nephropathy associated with type 1 diabetes mellitus (HCC)  E10.21 Microalbumin / creatinine urine ratio           Orders Placed in Encounter:  Lab Orders         Microalbumin / creatinine urine ratio         POCT HgB A1C    Imaging Orders  No imaging studies ordered today   Referral Orders  AMB Referral [facility]     Meds ordered this encounter  Medications   OLANZapine  (ZYPREXA ) 20 MG tablet    Sig: Take 1 tablet (20 mg total) by mouth at bedtime. Taper off as tolerated.    Dispense:  90 tablet     Refill:  4   Vitamin D-Vitamin K (VITAMIN K2-VITAMIN D3) 90-125 MCG CAPS    Sig: Take 1 tablet by mouth daily at 6 (six) AM.    Dispense:  90 capsule    Refill:  4      This document was synthesized by artificial intelligence ([employer]) using HIPAA-compliant recording of the clinical interaction;   We discussed the use of AI scribe software for clinical note transcription with the patient, who gave verbal consent to proceed. additional Info: This encounter employed state-of-the-art, real-time, collaborative documentation. The patient actively reviewed and assisted in updating their electronic medical record on a shared screen, ensuring transparency and facilitating joint problem-solving for the problem list, overview, and plan. This approach promotes accurate, informed care. The treatment plan was discussed and reviewed in detail, including medication safety, potential side effects, and all patient questions. We confirmed understanding and comfort with the plan. Follow-up instructions were established, including contacting the office for any concerns, returning if symptoms worsen, persist, or new symptoms develop, and precautions for potential emergency department visits.

## 2024-06-28 NOTE — Assessment & Plan Note (Signed)
 Depression is in remission. Olanzapine  was previously increased to 20 mg but is being tapered off as depression may have been related to physical distress from diabetes. Karen Hayden has been off olanzapine  for a week and feels stable. The goal is to discontinue olanzapine  if Karen Hayden remains stable without it. Prescribe olanzapine  20 mg with instructions to taper off as tolerated. Encourage discontinuation if Karen Hayden remains stable without it.

## 2024-06-28 NOTE — Assessment & Plan Note (Signed)
 Vitamin D deficiency and osteopenia   Vitamin D deficiency and osteopenia are present. She is on vitamin D supplementation but still experiences bone pain and muscle weakness. Advise increasing dietary intake of vitamin D and K2 through foods like eggs. Consider increasing vitamin D supplementation if symptoms persist.

## 2024-06-30 ENCOUNTER — Telehealth: Payer: Self-pay | Admitting: *Deleted

## 2024-06-30 DIAGNOSIS — E7800 Pure hypercholesterolemia, unspecified: Secondary | ICD-10-CM | POA: Diagnosis not present

## 2024-06-30 NOTE — Progress Notes (Signed)
 Care Guide Pharmacy Note  06/30/2024 Name: [name] MRN: [id number] DOB: 06/27/1976  Referred By: Jesus Bernardino MATSU, MD Reason for referral: Call Attempt #1 and Complex Care Management (Outreach to schedule referral with pharmacist )   Karen Hayden is a 48 y.o. year old female who is a primary care patient of Jesus Bernardino MATSU, MD.  Lonell CHRISTELLA Sharps was referred to the pharmacist for assistance related to: DMII  An unsuccessful telephone outreach was attempted today to contact the patient who was referred to the pharmacy team for assistance with medication management. Additional attempts will be made to contact the patient.  Thedford Franks, CMA [place]  [facility], [facility] Guide Direct Dial: [phone]  Fax: [phone] Website: [email or web address]

## 2024-07-02 ENCOUNTER — Ambulatory Visit: Payer: Self-pay | Admitting: Internal Medicine

## 2024-07-03 NOTE — Progress Notes (Unsigned)
 Care Guide Pharmacy Note  07/03/2024 Name: Karen Hayden MRN: [id number] DOB: 02/21/1976  Referred By: Jesus Bernardino MATSU, MD Reason for referral: Call Attempt #1 and Complex Care Management (Outreach to schedule referral with pharmacist )   Karen Hayden is a 48 y.o. year old female who is a primary care patient of Jesus Bernardino MATSU, MD.  Karen Hayden was referred to the pharmacist for assistance related to: DMII  A second unsuccessful telephone outreach was attempted today to contact the patient who was referred to the pharmacy team for assistance with medication management. Additional attempts will be made to contact the patient.  Karen Hayden, CMA [place]  [facility], [facility] Guide Direct Dial: [phone]  Fax: [phone] Website: [email or web address]

## 2024-07-04 NOTE — Progress Notes (Signed)
 Complex Care Management Note Care Guide Note  07/04/2024 Name: Karen Hayden MRN: [id number] DOB: February 26, 1976   Complex Care Management Outreach Attempts: A third unsuccessful outreach was attempted today to offer the patient with information about available complex care management services.  Follow Up Plan:  No further outreach attempts will be made at this time. We have been unable to contact the patient to offer or enroll patient in complex care management services.  Encounter Outcome:  No Answer  Thedford Franks, CMA [place]  [facility], [facility] Guide Direct Dial: [phone]  Fax: [phone] Website: [email or web address]

## 2024-07-05 DIAGNOSIS — E1065 Type 1 diabetes mellitus with hyperglycemia: Secondary | ICD-10-CM | POA: Diagnosis not present

## 2024-07-14 DIAGNOSIS — E7800 Pure hypercholesterolemia, unspecified: Secondary | ICD-10-CM | POA: Diagnosis not present

## 2024-07-14 DIAGNOSIS — Z4681 Encounter for fitting and adjustment of insulin pump: Secondary | ICD-10-CM | POA: Diagnosis not present

## 2024-07-14 DIAGNOSIS — M2550 Pain in unspecified joint: Secondary | ICD-10-CM | POA: Diagnosis not present

## 2024-07-14 DIAGNOSIS — E039 Hypothyroidism, unspecified: Secondary | ICD-10-CM | POA: Diagnosis not present

## 2024-07-14 DIAGNOSIS — I201 Angina pectoris with documented spasm: Secondary | ICD-10-CM | POA: Diagnosis not present

## 2024-07-14 DIAGNOSIS — I10 Essential (primary) hypertension: Secondary | ICD-10-CM | POA: Diagnosis not present

## 2024-07-14 DIAGNOSIS — E109 Type 1 diabetes mellitus without complications: Secondary | ICD-10-CM | POA: Diagnosis not present

## 2024-07-14 DIAGNOSIS — G720 Drug-induced myopathy: Secondary | ICD-10-CM | POA: Diagnosis not present

## 2024-07-14 DIAGNOSIS — E559 Vitamin D deficiency, unspecified: Secondary | ICD-10-CM | POA: Diagnosis not present

## 2024-08-04 DIAGNOSIS — E1065 Type 1 diabetes mellitus with hyperglycemia: Secondary | ICD-10-CM | POA: Diagnosis not present

## 2024-12-27 ENCOUNTER — Encounter: Admitting: Internal Medicine

## 2025-03-01 ENCOUNTER — Ambulatory Visit
